# Patient Record
Sex: Female | Born: 1950 | Race: White | Hispanic: No | Marital: Married | Smoking: Former smoker
Health system: Southern US, Community
[De-identification: ages and names within clinical notes are randomized; demographics above are authoritative.]

## PROBLEM LIST (undated history)

## (undated) DIAGNOSIS — K861 Other chronic pancreatitis: Secondary | ICD-10-CM

## (undated) DIAGNOSIS — Z87442 Personal history of urinary calculi: Secondary | ICD-10-CM

## (undated) DIAGNOSIS — N3281 Overactive bladder: Secondary | ICD-10-CM

## (undated) DIAGNOSIS — I700 Atherosclerosis of aorta: Secondary | ICD-10-CM

## (undated) DIAGNOSIS — R112 Nausea with vomiting, unspecified: Secondary | ICD-10-CM

## (undated) DIAGNOSIS — K8020 Calculus of gallbladder without cholecystitis without obstruction: Secondary | ICD-10-CM

## (undated) DIAGNOSIS — E119 Type 2 diabetes mellitus without complications: Secondary | ICD-10-CM

## (undated) DIAGNOSIS — Z9889 Other specified postprocedural states: Secondary | ICD-10-CM

## (undated) DIAGNOSIS — Z7901 Long term (current) use of anticoagulants: Secondary | ICD-10-CM

## (undated) DIAGNOSIS — I10 Essential (primary) hypertension: Secondary | ICD-10-CM

## (undated) DIAGNOSIS — I509 Heart failure, unspecified: Secondary | ICD-10-CM

## (undated) DIAGNOSIS — M1990 Unspecified osteoarthritis, unspecified site: Secondary | ICD-10-CM

## (undated) DIAGNOSIS — M51369 Other intervertebral disc degeneration, lumbar region without mention of lumbar back pain or lower extremity pain: Secondary | ICD-10-CM

## (undated) DIAGNOSIS — G4730 Sleep apnea, unspecified: Secondary | ICD-10-CM

## (undated) DIAGNOSIS — J449 Chronic obstructive pulmonary disease, unspecified: Secondary | ICD-10-CM

## (undated) DIAGNOSIS — I4891 Unspecified atrial fibrillation: Secondary | ICD-10-CM

## (undated) DIAGNOSIS — I2510 Atherosclerotic heart disease of native coronary artery without angina pectoris: Secondary | ICD-10-CM

## (undated) DIAGNOSIS — I499 Cardiac arrhythmia, unspecified: Secondary | ICD-10-CM

## (undated) DIAGNOSIS — R911 Solitary pulmonary nodule: Secondary | ICD-10-CM

## (undated) DIAGNOSIS — M797 Fibromyalgia: Secondary | ICD-10-CM

## (undated) DIAGNOSIS — R931 Abnormal findings on diagnostic imaging of heart and coronary circulation: Secondary | ICD-10-CM

## (undated) DIAGNOSIS — G4733 Obstructive sleep apnea (adult) (pediatric): Secondary | ICD-10-CM

## (undated) DIAGNOSIS — G0490 Encephalitis and encephalomyelitis, unspecified: Secondary | ICD-10-CM

## (undated) DIAGNOSIS — K760 Fatty (change of) liver, not elsewhere classified: Secondary | ICD-10-CM

## (undated) DIAGNOSIS — T8859XA Other complications of anesthesia, initial encounter: Secondary | ICD-10-CM

## (undated) DIAGNOSIS — E785 Hyperlipidemia, unspecified: Secondary | ICD-10-CM

## (undated) DIAGNOSIS — K449 Diaphragmatic hernia without obstruction or gangrene: Secondary | ICD-10-CM

## (undated) HISTORY — PX: KNEE ARTHROSCOPY: SUR90

## (undated) HISTORY — DX: Heart failure, unspecified: I50.9

## (undated) HISTORY — DX: Hyperlipidemia, unspecified: E78.5

## (undated) HISTORY — DX: Encephalitis and encephalomyelitis, unspecified: G04.90

## (undated) HISTORY — DX: Type 2 diabetes mellitus without complications: E11.9

## (undated) HISTORY — PX: BREAST CYST EXCISION: SHX579

## (undated) HISTORY — DX: Cardiac arrhythmia, unspecified: I49.9

## (undated) HISTORY — DX: Unspecified osteoarthritis, unspecified site: M19.90

## (undated) HISTORY — DX: Sleep apnea, unspecified: G47.30

## (undated) HISTORY — PX: DIAGNOSTIC LAPAROSCOPY: SUR761

## (undated) HISTORY — DX: Essential (primary) hypertension: I10

## (undated) HISTORY — DX: Abnormal findings on diagnostic imaging of heart and coronary circulation: R93.1

## (undated) HISTORY — PX: APPENDECTOMY: SHX54

---

## 2002-07-17 HISTORY — PX: BREAST EXCISIONAL BIOPSY: SUR124

## 2006-11-01 ENCOUNTER — Ambulatory Visit (HOSPITAL_COMMUNITY): Admission: RE | Admit: 2006-11-01 | Discharge: 2006-11-01 | Payer: Self-pay | Admitting: Internal Medicine

## 2006-12-03 ENCOUNTER — Ambulatory Visit (HOSPITAL_COMMUNITY): Admission: RE | Admit: 2006-12-03 | Discharge: 2006-12-03 | Payer: Self-pay | Admitting: Surgery

## 2007-03-15 ENCOUNTER — Encounter: Admission: RE | Admit: 2007-03-15 | Discharge: 2007-03-15 | Payer: Self-pay | Admitting: Orthopedic Surgery

## 2007-04-01 ENCOUNTER — Ambulatory Visit (HOSPITAL_COMMUNITY): Admission: RE | Admit: 2007-04-01 | Discharge: 2007-04-01 | Payer: Self-pay | Admitting: Internal Medicine

## 2007-11-22 ENCOUNTER — Ambulatory Visit (HOSPITAL_COMMUNITY): Admission: RE | Admit: 2007-11-22 | Discharge: 2007-11-22 | Payer: Self-pay | Admitting: Internal Medicine

## 2008-04-24 ENCOUNTER — Emergency Department (HOSPITAL_COMMUNITY): Admission: EM | Admit: 2008-04-24 | Discharge: 2008-04-25 | Payer: Self-pay | Admitting: Emergency Medicine

## 2008-04-26 ENCOUNTER — Emergency Department (HOSPITAL_COMMUNITY): Admission: EM | Admit: 2008-04-26 | Discharge: 2008-04-26 | Payer: Self-pay | Admitting: Emergency Medicine

## 2008-04-30 ENCOUNTER — Ambulatory Visit (HOSPITAL_BASED_OUTPATIENT_CLINIC_OR_DEPARTMENT_OTHER): Admission: RE | Admit: 2008-04-30 | Discharge: 2008-04-30 | Payer: Self-pay | Admitting: Urology

## 2009-05-13 ENCOUNTER — Encounter: Admission: RE | Admit: 2009-05-13 | Discharge: 2009-05-13 | Payer: Self-pay | Admitting: Family Medicine

## 2010-01-11 ENCOUNTER — Encounter: Admission: RE | Admit: 2010-01-11 | Discharge: 2010-01-11 | Payer: Self-pay | Admitting: Internal Medicine

## 2010-11-29 NOTE — Op Note (Signed)
NAME:  Tammy [name], Tammy [name] NO.:  [id number]   MEDICAL RECORD NO.:  [id number]          PATIENT TYPE:  AMB   LOCATION:  [facility]                         FACILITY:  [facility]   PHYSICIAN:  Jamison Neighbor, M.D.  DATE OF BIRTH:  22-Jun-1951   DATE OF PROCEDURE:  04/30/2008  DATE OF DISCHARGE:                               OPERATIVE REPORT   PREOPERATIVE DIAGNOSIS:  Left mid ureteral calculus.   POSTOPERATIVE DIAGNOSIS:  Left mid ureteral calculus.   PROCEDURE:  Cystoscopy, left retrograde, left ureteral dilation, left  ureteroscopy, left in situ laser lithotripsy, left double-J catheter  insertion.   SURGEON:  Jamison Neighbor, M.D.   ANESTHESIA:  General.   COMPLICATIONS:  None.   DRAINS:  A 6-French x 26 cm double-J catheter in the left ureter.   HISTORY:  This 60 year old female has a left-sided ureteral calculus  that is stuck.  It is difficult to visualize on plain films.  For that  reason, it is not amenable to ESWL.  The patient is now to undergo left  in situ laser lithotripsy.  She understands the risks and benefits of  the procedure and gave full informed consent.   PROCEDURE:  After successful induction of general anesthesia, the  patient was placed in the dorsal lithotomy position, prepped with  Betadine and draped in the usual sterile fashion.  Cystoscopy was  performed.  The bladder was carefully inspected.  No tumors or stones  could be seen.  Both ureteral orifices were normal in configuration and  location.  A left retrograde was done.  A 6-French open-ended catheter  was passed into the left ureter.  Contrast was injected under direct  vision.  This ureter was normal.  In fact, if anything, it was slightly  small.  At the level of L3-4, a filling defect could be seen that was  consistent with a stone that had been seen on previous imaging studies.  Contrast did go beyond that point and showed a dilated ureter but an  otherwise normal collecting system  with an unremarkable pelvis with no  filling defects or other irregularities seen.   Following completion of the left retrograde, a guidewire was passed up  to the kidney.  The ureteroscope could not be inserted much beyond the  intramural tunnel due to the small nature of the ureter.  For that  reason, a ureteral access sheath was obtained and was used to dilate the  ureter up to the level of the stone.  The ureteroscope was then  reintroduced alongside the guidewire and was brought all way up to the  stone.  The stone could be grasped with a basket but the ureter was  little too small to allow the stone to be removed.  For that reason, the  basket was disengaged.  A laser wire was then obtained and the stone was  fragmented.  This broke the stone down very nicely into tiny pieces  which will pass.  The ureteroscope was advanced.  Beyond that point  there was no obstruction higher up.  The ureteroscope was withdrawn  under  direct vision and there was no injury to the ureter.  Because the  patient did require the dilation, it was felt that a guidewire should  left in place and a  double-J should be inserted.  Using fluoroscopic  control, the guidewire was passed over the kidney and allowed to coil  normally within the kidney and the renal pelvis.  [name] bladder  was drained.  The patient tolerated procedure and was taken to the  recovery room in good condition.  She will be sent home with oxycodone  10/325,  __________ which she had previously taken for nausea, Ditropan  as needed for spasms, and Pyridium as needed for dysuria.  She will have  the stent removed in 1 week's time.      Jamison Neighbor, M.D.  Electronically Signed     RJE/MEDQ  D:  04/30/2008  T:  04/30/2008  Job:  [id number]

## 2010-12-27 ENCOUNTER — Other Ambulatory Visit: Payer: Self-pay | Admitting: Internal Medicine

## 2010-12-27 DIAGNOSIS — Z1231 Encounter for screening mammogram for malignant neoplasm of breast: Secondary | ICD-10-CM

## 2011-01-25 ENCOUNTER — Ambulatory Visit
Admission: RE | Admit: 2011-01-25 | Discharge: 2011-01-25 | Disposition: A | Discharge status: Home / Self Care | Source: Ambulatory Visit | Attending: Internal Medicine | Admitting: Internal Medicine

## 2011-01-25 DIAGNOSIS — Z1231 Encounter for screening mammogram for malignant neoplasm of breast: Secondary | ICD-10-CM

## 2011-04-17 LAB — POCT I-STAT, CHEM 8
BUN: 13
BUN: 19
Calcium, Ion: 1.05 — ABNORMAL LOW
Calcium, Ion: 1.16
Chloride: 103
Chloride: 103
Creatinine, Ser: 1.1
Creatinine, Ser: 1.2
Glucose, Bld: 117 — ABNORMAL HIGH
Glucose, Bld: 122 — ABNORMAL HIGH
HCT: 43
HCT: 48 — ABNORMAL HIGH
Hemoglobin: 14.6
Hemoglobin: 16.3 — ABNORMAL HIGH
Potassium: 4.3
Potassium: 4.8
Sodium: 136
Sodium: 138
TCO2: 29
TCO2: 30

## 2011-04-17 LAB — CBC
HCT: 43
HCT: 45.5
Hemoglobin: 14.1
Hemoglobin: 15.1 — ABNORMAL HIGH
MCHC: 32.7
MCHC: 33.2
MCV: 86.6
MCV: 87.7
Platelets: 212
Platelets: 214
RBC: 4.91
RBC: 5.25 — ABNORMAL HIGH
RDW: 13.9
RDW: 14.2
WBC: 12.5 — ABNORMAL HIGH
WBC: 13.9 — ABNORMAL HIGH

## 2011-04-17 LAB — DIFFERENTIAL
Basophils Absolute: 0.1
Basophils Absolute: 0.3 — ABNORMAL HIGH
Basophils Relative: 1
Basophils Relative: 2 — ABNORMAL HIGH
Eosinophils Absolute: 0
Eosinophils Absolute: 0.1
Eosinophils Relative: 0
Eosinophils Relative: 0
Lymphocytes Relative: 11 — ABNORMAL LOW
Lymphocytes Relative: 8 — ABNORMAL LOW
Lymphs Abs: 1
Lymphs Abs: 1.6
Monocytes Absolute: 0.9
Monocytes Absolute: 1.1 — ABNORMAL HIGH
Monocytes Relative: 7
Monocytes Relative: 8
Neutro Abs: 10.3 — ABNORMAL HIGH
Neutro Abs: 11.1 — ABNORMAL HIGH
Neutrophils Relative %: 80 — ABNORMAL HIGH
Neutrophils Relative %: 82 — ABNORMAL HIGH

## 2011-04-17 LAB — URINALYSIS, ROUTINE W REFLEX MICROSCOPIC
Bilirubin Urine: NEGATIVE
Glucose, UA: NEGATIVE
Ketones, ur: 40 — AB
Leukocytes, UA: NEGATIVE
Nitrite: NEGATIVE
Protein, ur: 30 — AB
Specific Gravity, Urine: 1.028
Urobilinogen, UA: 0.2
pH: 6

## 2011-04-17 LAB — URINE MICROSCOPIC-ADD ON

## 2011-04-18 LAB — POCT I-STAT, CHEM 8
BUN: 12
Calcium, Ion: 1.2
Chloride: 100
Creatinine, Ser: 0.9
Glucose, Bld: 110 — ABNORMAL HIGH
HCT: 43
Hemoglobin: 14.6
Potassium: 3.5
Sodium: 139
TCO2: 29

## 2011-04-27 LAB — URINE CULTURE
Colony Count: NO GROWTH
Culture: NO GROWTH

## 2011-09-19 ENCOUNTER — Telehealth (HOSPITAL_COMMUNITY): Payer: Self-pay | Admitting: Dietician

## 2011-09-19 NOTE — Telephone Encounter (Signed)
Received referral from [facility] (Dr. Jacinto Halim) for dx: weight management.

## 2011-09-19 NOTE — Telephone Encounter (Signed)
Appointment scheduled for 10/17/11 at 8:30 AM.

## 2011-10-17 ENCOUNTER — Encounter (HOSPITAL_COMMUNITY): Payer: Self-pay | Admitting: Dietician

## 2011-10-17 NOTE — Progress Notes (Signed)
Pt was a no-show for appointment scheduled for 10/17/11 at 8:30 AM. Sent letter to pt home notifying pt of no-show and requesting rescheduling appointment.

## 2012-04-02 ENCOUNTER — Other Ambulatory Visit: Payer: Self-pay | Admitting: Internal Medicine

## 2012-04-02 DIAGNOSIS — Z1231 Encounter for screening mammogram for malignant neoplasm of breast: Secondary | ICD-10-CM

## 2012-05-20 ENCOUNTER — Ambulatory Visit
Admission: RE | Admit: 2012-05-20 | Discharge: 2012-05-20 | Disposition: A | Discharge status: Home / Self Care | Source: Ambulatory Visit | Attending: Internal Medicine | Admitting: Internal Medicine

## 2012-05-20 DIAGNOSIS — Z1231 Encounter for screening mammogram for malignant neoplasm of breast: Secondary | ICD-10-CM

## 2012-06-06 ENCOUNTER — Ambulatory Visit (HOSPITAL_COMMUNITY)
Admission: RE | Admit: 2012-06-06 | Discharge: 2012-06-06 | Disposition: A | Discharge status: Home / Self Care | Source: Ambulatory Visit | Attending: Family Medicine | Admitting: Family Medicine

## 2012-06-06 DIAGNOSIS — IMO0001 Reserved for inherently not codable concepts without codable children: Secondary | ICD-10-CM | POA: Insufficient documentation

## 2012-06-06 DIAGNOSIS — M1710 Unilateral primary osteoarthritis, unspecified knee: Secondary | ICD-10-CM | POA: Insufficient documentation

## 2012-06-06 DIAGNOSIS — M25569 Pain in unspecified knee: Secondary | ICD-10-CM | POA: Insufficient documentation

## 2012-06-06 NOTE — Evaluation (Signed)
Physical Therapy Evaluation  Patient Details  Name: Tammy [name] MRN: [id number] Date of Birth: February 02, 1951  Today's Date: 06/06/2012 Time: 1520-1600 PT Time Calculation (min): 40 min Charges: 1 eval, 10' TE0 Visit#: 1  of 8   Re-eval: 07/06/12  Diagnosis: b knee OA Next MD Visit: Dr. Althea Charon  Subjective Symptoms/Limitations Symptoms: PMH: DMII, HTN, Hyperlipidemia, gabapentin.  Pertinent History: Pt is referred to PT for B knee pain which has been evident for years, however in the last few months they have become progressivly worse.  She reports that she recieved an injection to both of her knees last wekk which has decreased the pain.  She plans on getting a shot weekly for 5 weeks.  The MD has placed an order for a SPC.  She reports that she has most difficulty with her balance while stepping on a curb. She has difficulty picking up object from the ground.  How long can you stand comfortably?: 10 minutes How long can you walk comfortably?: flat surface: 30-45 minutes, rugged terrian: 10 minutes (on her driveway).  Patient Stated Goals: "I want to become more limber."  Pain Assessment Currently in Pain?: Yes Pain Score:   3 (Pain range: 3-8/10.  ) Pain Location: Knee Pain Orientation: Right;Left Pain Relieving Factors: cortisone injection, aleeve, denies trying ice.  Effect of Pain on Daily Activities: bending over (squatting), pain in the AM, difficulty going from sit to stand  Prior Functioning  Prior Function Comments: She enjoys taking care of her mom (helping around the house and taking her out), she enjoys shopping.   Cognition/Observation Observation/Other Assessments Observations: Q: angle: L: 20 degrees; R: 25 Other Assessments: R and L patella lateral tilt   Sensation/Coordination/Flexibility/Functional Tests Functional Tests Functional Tests: LEFS: 15/80  RLE Strength Right Hip Flexion: 4/5 Right Hip Extension: 3+/5 Right Hip ABduction: 3+/5 Right Hip  ADduction: 4/5 Right Knee Flexion: 3+/5 Right Knee Extension: 4/5  LLE Strength Left Hip Flexion: 4/5 Left Hip Extension: 3+/5 Left Hip ABduction: 4/5 Left Hip ADduction: 3+/5 Left Knee Flexion: 3+/5 Left Knee Extension: 4/5  Palpation: mild atrophy to B gluteal region. Increased fascial restrictions to R fibular head.   Mobility/Balance  Ambulation/Gait Ambulation/Gait: Yes Gait Pattern: Antalgic (genu valgum) Static Standing Balance Single Leg Stance - Right Leg: 2  Single Leg Stance - Left Leg: 10  Tandem Stance - Right Leg: 10  (impaired hip and ankle strategy) Tandem Stance - Left Leg: 10  Rhomberg - Eyes Opened: 10  Rhomberg - Eyes Closed: 10    Exercise/Treatments Supine Straight Leg Raises: Both;10 reps Sidelying Hip ABduction: Both;10 reps Hip ADduction: Both;10 reps Prone  Hip Extension: Both;10 reps  Physical Therapy Assessment and Plan PT Assessment and Plan Clinical Impression Statement: Pt is a 61 year old female referred to PT for bilateral knee OA.   Pt will benefit from skilled therapeutic intervention in order to improve on the following deficits: Decreased balance;Decreased strength;Pain;Abnormal gait;Impaired perceived functional ability Rehab Potential: Good PT Frequency: Min 2X/week PT Duration: 4 weeks PT Treatment/Interventions: Gait training;Stair training;Functional mobility training;Therapeutic activities;Therapeutic exercise;Balance training;Patient/family education;Manual techniques;Modalities PT Plan: Tape patella for appropriate alignment, Continue with general LE strengthening: standing: squatting, heel and toe raises, knee flexion, SLS; supine 4 way SLR, SAQ and quad sets.  Manual techniques and modalities to decrease pain to R fibular head. Continue to progress balance and decreased pain     Goals Home Exercise Program Pt will Perform Home Exercise Program: Independently PT Goal: Perform Home Exercise Program -  Progress: Goal set  today PT Short Term Goals Time to Complete Short Term Goals: 2 weeks PT Short Term Goal 1: Pt will improve LE strength by 1 muscle strength. PT Short Term Goal 2: Pt will decrease fascial restrictions to R knee.  PT Short Term Goal 3: Pt will improve static balance and demonstrate R and L SLS x30 sec on solid surface.  PT Long Term Goals Time to Complete Long Term Goals: 4 weeks PT Long Term Goal 1: Pt will improve LE strength in order to tolerate squatting. PT Long Term Goal 2: Pt will improve LE strength in order to ambulate with apporpriate gait mechanics.  Long Term Goal 3: Pt will report pain less than 2/10 for 75% of her day for improved QOL.  Long Term Goal 4: Pt will improve her LEFS to 40/80 for improved percieved functional ability.   Problem List Patient Active Problem List  Diagnosis  . Knee pain   Grey Rakestraw, PT 06/06/2012, 4:11 PM  Physician Documentation Your signature is required to indicate approval of the treatment plan as stated above.  Please sign and either send electronically or make a copy of this report for your files and return this physician signed original.   Please mark one 1.__approve of plan  2. ___approve of plan with the following conditions.   ______________________________                                                          _____________________ Physician Signature                                                                                                             Date

## 2012-06-07 NOTE — Evaluation (Signed)
Please fax to Dr. Althea Charon at his [facility] office.  Thanks.

## 2012-06-11 ENCOUNTER — Ambulatory Visit (HOSPITAL_COMMUNITY)
Admission: RE | Admit: 2012-06-11 | Discharge: 2012-06-11 | Disposition: A | Discharge status: Home / Self Care | Source: Ambulatory Visit | Attending: Family Medicine | Admitting: Family Medicine

## 2012-06-11 NOTE — Progress Notes (Signed)
Physical Therapy Treatment Patient Details  Name: Tammy [name] MRN: [id number] Date of Birth: [birth year]  Today's Date: 06/11/2012 Time: 4098-1191 PT Time Calculation (min): 32 min Visit#: 2  of 8   Re-eval: 07/06/12 Charges:  therex 24', taping X 1  Subjective: Symptoms/Limitations Symptoms: Pt. reports pain comes and goes; mostly only having pain in her R knee when she does have it.   Exercise/Treatments Standing Heel Raises: 10 reps;Limitations Heel Raises Limitations: toeraises 10 reps Knee Flexion: 10 reps Functional Squat: 10 reps Rocker Board: 1 minute SLS: R:10", L:35" max of 3 Seated Long Arc Quad: 10 reps;Both Supine Quad Sets: 10 reps;5 sets Hip Adduction Isometric: 10 reps;Limitations Hip Adduction Isometric Limitations: 5" holds Bridges: 10 reps Straight Leg Raises: 10 reps;Both Knee Flexion: 10 reps;Both Sidelying Hip ABduction: Both;10 reps Hip ADduction: Both;10 reps Prone  Hamstring Curl: 10 reps Hip Extension: Both;10 reps   Manual Therapy Manual Therapy: Other (comment) Other Manual Therapy: kinesiotaping for lateral patellar tracking  Physical Therapy Assessment and Plan PT Assessment and Plan Clinical Impression Statement: Applied kinesiotape to R patella for lateral tracking prior to activity. Added standing exercises with manual /VC's for form and to decrease substitution. Pt. reported pain with rockerboard, only able to complete 1 minute of activity. Noted difficulty with SLS Pt will benefit from skilled therapeutic intervention in order to improve on the following deficits: Decreased balance;Decreased strength;Pain;Abnormal gait;Impaired perceived functional ability Rehab Potential: Good PT Frequency: Min 2X/week PT Duration: 4 weeks PT Treatment/Interventions: Gait training;Stair training;Functional mobility training;Therapeutic activities;Therapeutic exercise;Balance training;Patient/family education;Manual techniques;Modalities PT  Plan: Continue to progress strength; assess effectiveness of kinesiotaping next visit.     Problem List Patient Active Problem List  Diagnosis  . Knee pain    PT - End of Session Activity Tolerance: Patient tolerated treatment well General Behavior During Session: [facility] for tasks performed Cognition: [facility] for tasks performed   Lurena Nida, PTA/CLT 06/11/2012, 4:53 PM

## 2012-06-12 ENCOUNTER — Ambulatory Visit (HOSPITAL_COMMUNITY)
Admission: RE | Admit: 2012-06-12 | Discharge: 2012-06-12 | Disposition: A | Discharge status: Home / Self Care | Source: Ambulatory Visit | Attending: Family Medicine | Admitting: Family Medicine

## 2012-06-12 NOTE — Progress Notes (Signed)
Physical Therapy Treatment Patient Details  Name: Tammy [name] MRN: [id number] Date of Birth: [birth year]  Today's Date: 06/12/2012 Time: 2952-8413 PT Time Calculation (min): 40 min Visit#: 3  of 8   Re-eval: 07/06/12 Charges: therex 34', taping X 1  Subjective: Symptoms/Limitations Symptoms: Pt. states she thinks the tape helped; reports pain is 2/10 today; tape just came off this morning. Pain Assessment Currently in Pain?: Yes Pain Score:   2 Pain Location: Knee Pain Orientation: Right   Exercise/Treatments Standing Heel Raises: 15 reps Heel Raises Limitations: toeraises 15 reps Rocker Board: Limitations Rocker Board Limitations: too painful today SLS: R:18", L:15 max of 3 SLS with Vectors: 5X5" holds each fingertip hold Seated Long Arc Quad: 15 reps;Both Supine Quad Sets: 15 reps Hip Adduction Isometric: 15 reps Hip Adduction Isometric Limitations: 5" holds Bridges: 15 reps Straight Leg Raises: 15 reps Sidelying Hip ABduction: 15 reps;Both Hip ADduction: 15 reps;Both Prone  Hamstring Curl: 15 reps Hip Extension: 15 reps   Manual Therapy Manual Therapy: Other (comment) Other Manual Therapy: Kinesiotaping for lateral patellar tracking  Physical Therapy Assessment and Plan PT Assessment and Plan Clinical Impression Statement: kinesiotape appears to be helping with patellar tracking with activity.  Pt. continues to have pain with R/L rockerboard, so held activity today.  Added vector stance to help increase stability to progress toward goal of 1 minute SLS on each LE.  Able to increase reps today without diffiuculty. PT Plan: Continue to progress strength by increasing reps/weights as able.      Problem List Patient Active Problem List  Diagnosis  . Knee pain    PT - End of Session Activity Tolerance: Patient tolerated treatment well General Behavior During Session: [facility] for tasks performed Cognition: [facility] for tasks performed   Lurena Nida,  PTA/CLT 06/12/2012, 2:30 PM

## 2012-06-18 ENCOUNTER — Ambulatory Visit (HOSPITAL_COMMUNITY)
Admission: RE | Admit: 2012-06-18 | Discharge: 2012-06-18 | Disposition: A | Discharge status: Home / Self Care | Source: Ambulatory Visit | Attending: Family Medicine | Admitting: Family Medicine

## 2012-06-18 DIAGNOSIS — M1710 Unilateral primary osteoarthritis, unspecified knee: Secondary | ICD-10-CM | POA: Insufficient documentation

## 2012-06-18 DIAGNOSIS — IMO0001 Reserved for inherently not codable concepts without codable children: Secondary | ICD-10-CM | POA: Insufficient documentation

## 2012-06-18 NOTE — Progress Notes (Signed)
Physical Therapy Treatment Patient Details  Name: Tammy [name] MRN: [id number] Date of Birth: 21-Oct-1950  Today's Date: 06/18/2012 Time: 8295-6213 PT Time Calculation (min): 48 min  Visit#: 4  of 8   Re-eval: 07/06/12 Assessment Diagnosis: b knee OA Next MD Visit: Dr. Althea Charon 06/20/2012 Prior Therapy: None Charge: therex 38', kinesiotaping x 1 unit, manual x 8'  Subjective: Symptoms/Limitations Symptoms: Pt reported she thinks the kinesiotape helps but fell off following last session.  B knees pain 5/10.  Pt with MD apt on Thursday, going to get shots. Pain Assessment Currently in Pain?: Yes Pain Score:   5 Pain Location: Knee Pain Orientation: Right;Left  Objective:   Exercise/Treatments Aerobic Stationary Bike: 6' @ 1.0 for ROM, strength and activity tolerance Standing Heel Raises: 15 reps Heel Raises Limitations: toeraises 15 reps Functional Squat: 15 reps SLS: R: 12", L2x 30" max of 3; increased fatigue due to SLS done at end of session SLS with Vectors: 5X5" holds 1 fingertip Seated  LAQ- time Supine Short Arc Quad Sets: 15 reps;Limitations Short Arc Quad Sets Limitations: 3# Bridges: 15 reps Straight Leg Raises: 10 reps;Limitations Straight Leg Raises Limitations: 3# Patellar Mobs: S/I, R/L and tib/fib for patellar tracking and pain reduction Sidelying Hip ABduction: 15 reps;Both Hip ADduction: 15 reps;Both Clams: 5x 10" with manual faciliation for glut med contraction and tactile cueing to reduce h/s activation Prone   prone ex- time   Manual Therapy Manual Therapy: Joint mobilization Joint Mobilization: grade I-II patella mobs R/L, S/I and tib/fib mobs Other Manual Therapy: Kinesiotaping for lateral patellar tracking  Physical Therapy Assessment and Plan PT Assessment and Plan Clinical Impression Statement: Pt c/o knee popping while riding stationary bicycle at beginning of session, kinesiotaping appears to help with lateral patella tracking.  No  c/o popping during therex following taping.  Patella mobs complete for lateral tracking..  Pt reported pain reduced with tib/fib joint mobs.   Pt required multimodal cueing to reduce compensation with mat activities.  Added clam to improve hip mobility with manual facilitation required for glut med contraction and tactile cueing to h/s to relax. PT Plan: Re-assess prior MD apt next session.  Continue to progress strength, resume isometric adduction or add ball between knees during bridges.  Continue with taping to help with patella tracking.    Goals    Problem List Patient Active Problem List  Diagnosis  . Knee pain    PT - End of Session Activity Tolerance: Patient tolerated treatment well General Behavior During Session: [facility] for tasks performed Cognition: [facility] for tasks performed  GP    Juel Burrow 06/18/2012, 4:17 PM

## 2012-06-20 ENCOUNTER — Ambulatory Visit (HOSPITAL_COMMUNITY)
Admission: RE | Admit: 2012-06-20 | Discharge: 2012-06-20 | Disposition: A | Discharge status: Home / Self Care | Source: Ambulatory Visit | Attending: Family Medicine | Admitting: Family Medicine

## 2012-06-20 NOTE — Evaluation (Cosign Needed)
Physical Therapy Discharge and Treatment  Patient Details  Name: Tammy [name] MRN: [id number] Date of Birth: 1951/03/08  Today's Date: 06/20/2012 Time: 1110-1145 PT Time Calculation (min): 35 min Charges: 1 MMT, 15' TE, 8 Self Care Visit#: 5  of 8   Re-eval: 07/06/12 Assessment Diagnosis: b knee OA Next MD Visit: Dr. Althea Charon 06/20/2012  Subjective Symptoms/Limitations Symptoms: Pt reports that she is not doing to well today.  She feels that therapy is hurting more than helping.  Pain Assessment Currently in Pain?: Yes Pain Score:   8 (w/o pain medication) Pain Orientation: Right;Left Pain Relieving Factors: pain medication and ice  Sensation/Coordination/Flexibility/Functional Tests Coordination Gross Motor Movements are Fluid and Coordinated: No Coordination and Movement Description: improved L distal quad coordination, continues to have moderate impairement to R VMO activation Functional Tests Functional Tests: LEFS: 25/80 (was 15/80)  RLE Strength Right Hip Flexion: 5/5 (4+/5, was 4/5) Right Hip Extension: 4/5 (was 3+/5) Right Hip ABduction: 4/5 (was 3+/5) Right Hip ADduction:  (was 4/5) Right Knee Flexion: 4/5 (was 3+/5) Right Knee Extension: 5/5 (was 4/5)  LLE Strength Left Hip Flexion: 5/5 (was 4/5) Left Hip Extension: 4/5 (was 3+/5) Left Hip ABduction: 4/5 (was 4/5) Left Hip ADduction:  (was 3+/5) Left Knee Flexion: 4/5 (was 3+/5) Left Knee Extension: 5/5 (was 4/5)  Palpation: Fascial restriciton to R fibular head and quadriceps  Mobility/Balance  Ambulation/Gait Gait Pattern: Antalgic (genu valgum) Static Standing Balance Single Leg Stance - Right Leg: 12  (was 2) Single Leg Stance - Left Leg: 12  (was 10)   Exercise/Treatments Standing SLS: 3x30" (best time 12 c/o UE support) Supine Straight Leg Raises: Right;20 reps Patellar Mobs: education and demonstration in all directions  Sidelying Hip ABduction: Both;20 reps Clams: 20x10" holds Prone   Hamstring Curl: 10 reps (BLE) Hip Extension: Both;20 reps  Physical Therapy Assessment and Plan PT Assessment and Plan Clinical Impression Statement: Ms. Knezevic has attended 5 OP PT visits to address B knee OA with following findings: met 1/4 STG and  0/4 LTG and is not making progress secondary to increased pain to knee joint.  She has improve her overall quadricep activation and LE strength, however still demonstrates greatest difficulty to RLE due to impaired strength. D/C from PT secondary to pt request. PT Plan: D/C    Goals Pt will Perform Home Exercise Program: Independently: Met PT Short Term Goals: 2 weeks  Goal 1: Pt will improve LE strength by 1 muscle strength.: Progressing toward goal Goal 2: Pt will decrease fascial restrictions to R knee. : Progressing toward goal (to quadricep and fibular head) Goal 3: Pt will improve static balance and demonstrate R and L SLS x30 sec on solid surface. : Progressing toward goal (R and L 12 sec) PT Long Term Goals: 4 weeks Goal 1: Pt will improve LE strength in order to tolerate squatting. Not met Goal 2: Pt will improve LE strength in order to ambulate with apporpriate gait mechanics. : Not met Goal 3: Pt will report pain less than 2/10 for 75% of her day for improved QOL. : Not met Goal 4: Pt will improve her LEFS to 40/80 for improved percieved functional ability. : Not met  Problem List Patient Active Problem List  Diagnosis  . Knee pain   PT - End of Session Activity Tolerance: Patient tolerated treatment well General Behavior During Session: [facility] for tasks performed Cognition: [facility] for tasks performed PT Plan of Care PT Patient Instructions: discussed LEFS, importance to continue with strengthening (  especially to RLE) to improve strength and coordination if pt requires surgery in the future.  Consulted and Agree with Plan of Care: Patient  Annett Fabian, PT 06/20/2012, 12:09 PM  Physician Documentation Your signature is  required to indicate approval of the treatment plan as stated above.  Please sign and either send electronically or make a copy of this report for your files and return this physician signed original.   Please mark one 1.__approve of plan  2. ___approve of plan with the following conditions.   ______________________________                                                          _____________________ Physician Signature                                                                                                             Date

## 2014-08-05 ENCOUNTER — Other Ambulatory Visit: Payer: Self-pay

## 2014-08-05 ENCOUNTER — Other Ambulatory Visit: Payer: Self-pay | Admitting: Internal Medicine

## 2014-08-05 DIAGNOSIS — E1149 Type 2 diabetes mellitus with other diabetic neurological complication: Secondary | ICD-10-CM

## 2014-08-05 DIAGNOSIS — E785 Hyperlipidemia, unspecified: Secondary | ICD-10-CM

## 2014-08-05 DIAGNOSIS — Z1231 Encounter for screening mammogram for malignant neoplasm of breast: Secondary | ICD-10-CM

## 2014-08-21 ENCOUNTER — Ambulatory Visit
Admission: RE | Admit: 2014-08-21 | Discharge: 2014-08-21 | Disposition: A | Discharge status: Home / Self Care | Source: Ambulatory Visit

## 2014-08-21 ENCOUNTER — Ambulatory Visit

## 2014-08-21 ENCOUNTER — Ambulatory Visit
Admission: RE | Admit: 2014-08-21 | Discharge: 2014-08-21 | Disposition: A | Discharge status: Home / Self Care | Payer: No Typology Code available for payment source | Source: Ambulatory Visit | Attending: Internal Medicine | Admitting: Internal Medicine

## 2014-08-21 DIAGNOSIS — Z1231 Encounter for screening mammogram for malignant neoplasm of breast: Secondary | ICD-10-CM

## 2014-08-21 DIAGNOSIS — E1149 Type 2 diabetes mellitus with other diabetic neurological complication: Secondary | ICD-10-CM

## 2014-08-21 DIAGNOSIS — E785 Hyperlipidemia, unspecified: Secondary | ICD-10-CM

## 2014-11-02 ENCOUNTER — Ambulatory Visit (INDEPENDENT_AMBULATORY_CARE_PROVIDER_SITE_OTHER): Admitting: Cardiology

## 2014-11-02 ENCOUNTER — Encounter: Payer: Self-pay | Admitting: Cardiology

## 2014-11-02 ENCOUNTER — Other Ambulatory Visit (HOSPITAL_COMMUNITY): Payer: Self-pay | Admitting: Cardiology

## 2014-11-02 VITALS — BP 118/58 | HR 86 | Ht 62.0 in | Wt 267.1 lb

## 2014-11-02 DIAGNOSIS — I2584 Coronary atherosclerosis due to calcified coronary lesion: Secondary | ICD-10-CM | POA: Diagnosis not present

## 2014-11-02 DIAGNOSIS — I2510 Atherosclerotic heart disease of native coronary artery without angina pectoris: Secondary | ICD-10-CM

## 2014-11-02 NOTE — Progress Notes (Signed)
Cardiology Office Note   Date:  11/02/2014   ID:  Lasharn Bufkin, DOB [birth year], MRN [id number]  PCP:  [name], MD  Cardiologist:   [name], MD   Chief Complaint  Patient presents with  . Coronary Artery Disease      History of Present Illness: Tammy [name] is a 64 y.o. female who presents for evaluation of calcium. She has no past cardiac history. Recently she had a coronary calcium score and I was able to review this. Her level is greater than 899 percentile for age. She is somewhat limited in activities because of knee pain. She's not describing any chest pressure, neck or arm discomfort. She's not been having any palpitations other than occasionally at night. She's not having any presyncope or syncope. She has no shortness of breath, PND or orthopnea. There've been no edema or weight gain.  She said she did have a stress test years ago was negative. She does have multiple cardiovascular risk factors.   Past Medical History  Diagnosis Date  . Agatston coronary artery calcium score greater than 400   . Sleep apnea     No CPAP  . HTN (hypertension)   . Diabetes mellitus     x 3 years  . Hyperlipidemia   . DJD (degenerative joint disease)   . Encephalitis     Past Surgical History  Procedure Laterality Date  . Knee arthroscopy    . Appendectomy    . Breast cyst excision       Current Outpatient Prescriptions  Medication Sig Dispense Refill  . amLODipine-valsartan (EXFORGE) 10-320 MG per tablet Take 1 tablet by mouth daily.    Marland Kitchen aspirin 81 MG tablet Take 81 mg by mouth daily.    . beta carotene w/minerals (OCUVITE) tablet Take 1 tablet by mouth daily.    . DULoxetine (CYMBALTA) 60 MG capsule Take 60 mg by mouth daily.    Marland Kitchen EPINEPHrine 0.3 mg/0.3 mL IJ SOAJ injection Inject 0.3 mg into the muscle as needed.    . fesoterodine (TOVIAZ) 4 MG TB24 tablet Take 4 mg by mouth daily.    . metFORMIN (GLUCOPHAGE) 1000 MG tablet Take 1,000 mg by mouth 2 (two)  times daily with a meal.    . naproxen (NAPROSYN) 500 MG tablet Take 500 mg by mouth as needed.    . traMADol (ULTRAM) 50 MG tablet Take 50 mg by mouth as needed.     No current facility-administered medications for this visit.    Allergies:   Betadine; Contrast media; Iodine; and Shellfish allergy    Social History:  The patient  reports that she quit smoking about 6 years ago. Her smoking use included Cigarettes. She has a 30 pack-year smoking history. She does not have any smokeless tobacco history on file. She reports that she does not drink alcohol or use illicit drugs.   Family History:  [name] family history includes CAD (age of onset: 39) in her mother; CAD (age of onset: 32) in her brother.    ROS:  Please see the history of present illness.   Otherwise, review of systems are positive for knee pain.   All other systems are reviewed and negative.    PHYSICAL EXAM: VS:  BP 118/58 mmHg  Pulse 86  Ht 5\' 2"  (1.575 m)  Wt 267 lb 1.6 oz (121.156 kg)  BMI 48.84 kg/m2 , BMI Body mass index is 48.84 kg/(m^2). GENERAL:  Well appearing HEENT:  Pupils equal round  and reactive, fundi not visualized, oral mucosa unremarkable NECK:  No jugular venous distention, waveform within normal limits, carotid upstroke brisk and symmetric, no bruits, no thyromegaly LYMPHATICS:  No cervical, inguinal adenopathy LUNGS:  Clear to auscultation bilaterally BACK:  No CVA tenderness CHEST:  Unremarkable HEART:  PMI not displaced or sustained,S1 and S2 within normal limits, no S3, no S4, no clicks, no rubs, no murmurs ABD:  Flat, positive bowel sounds normal in frequency in pitch, no bruits, no rebound, no guarding, no midline pulsatile mass, no hepatomegaly, no splenomegaly EXT:  2 plus pulses throughout, no edema, no cyanosis no clubbing SKIN:  No rashes no nodules NEURO:  Cranial nerves II through XII grossly intact, motor grossly intact throughout PSYCH:  Cognitively intact, oriented to person  place and time    EKG:  EKG is ordered today. The ekg ordered today demonstrates sinus rhythm, rate 86, axis within normal limits, intervals within normal limits, no acute ST-T wave changes.   Recent Labs: No results found for requested labs within last 365 days.    Lipid Panel No results found for: CHOL, TRIG, HDL, CHOLHDL, VLDL, LDLCALC, LDLDIRECT    Wt Readings from Last 3 Encounters:  11/02/14 267 lb 1.6 oz (121.156 kg)      Other studies Reviewed: Additional studies/ records that were reviewed today include: None.    ASSESSMENT AND PLAN:  CORONARY CALCIUM:  The patient has no overt symptoms. I would like to screen with a treadmill test. However, she is limited by knee pain would not be a walk on a treadmill. Therefore, she will have a [employer].  DYSLIPIDEMIA:  She apparently is intolerant of statins. I sent a message to [name], MD  Get documentation on this.  At that point we would refer her to our lipid clinic for PCSK9 inhibition.  OBESITY:  The patient understands the need to lose weight with diet and exercise. We have discussed specific strategies for this.   Current medicines are reviewed at length with the patient today.  The patient does not have concerns regarding medicines.  The following changes have been made:  no change  Labs/ tests ordered today include:   Orders Placed This Encounter  Procedures  . Myocardial Perfusion Imaging  . EKG 12-Lead     Disposition:   FU with me in six months.     Signed, [name], MD  11/02/2014 8:32 AM    [facility]

## 2014-11-02 NOTE — Patient Instructions (Signed)
Your physician has requested that you have a lexiscan myoview. For further information please visit [email or web address]. Please follow instruction sheet, as given.  Dr.Hochrein wants you to follow-up in: 6 months. You will receive a reminder letter in the mail two months in advance. If you don't receive a letter, please call our office to schedule the follow-up appointment.

## 2014-11-05 ENCOUNTER — Telehealth (HOSPITAL_COMMUNITY): Payer: Self-pay

## 2014-11-05 ENCOUNTER — Encounter (HOSPITAL_COMMUNITY): Payer: No Typology Code available for payment source

## 2014-11-05 NOTE — Telephone Encounter (Signed)
Encounter complete. 

## 2014-11-10 ENCOUNTER — Ambulatory Visit (HOSPITAL_COMMUNITY)
Admission: RE | Admit: 2014-11-10 | Discharge: 2014-11-10 | Disposition: A | Discharge status: Home / Self Care | Source: Ambulatory Visit | Attending: Cardiology | Admitting: Cardiology

## 2014-11-10 DIAGNOSIS — R42 Dizziness and giddiness: Secondary | ICD-10-CM | POA: Diagnosis not present

## 2014-11-10 DIAGNOSIS — Z6841 Body Mass Index (BMI) 40.0 and over, adult: Secondary | ICD-10-CM | POA: Diagnosis not present

## 2014-11-10 DIAGNOSIS — R0609 Other forms of dyspnea: Secondary | ICD-10-CM | POA: Insufficient documentation

## 2014-11-10 DIAGNOSIS — Z8249 Family history of ischemic heart disease and other diseases of the circulatory system: Secondary | ICD-10-CM | POA: Insufficient documentation

## 2014-11-10 DIAGNOSIS — E669 Obesity, unspecified: Secondary | ICD-10-CM | POA: Insufficient documentation

## 2014-11-10 DIAGNOSIS — R5383 Other fatigue: Secondary | ICD-10-CM | POA: Insufficient documentation

## 2014-11-10 DIAGNOSIS — J449 Chronic obstructive pulmonary disease, unspecified: Secondary | ICD-10-CM | POA: Diagnosis not present

## 2014-11-10 DIAGNOSIS — Z87891 Personal history of nicotine dependence: Secondary | ICD-10-CM | POA: Insufficient documentation

## 2014-11-10 DIAGNOSIS — E119 Type 2 diabetes mellitus without complications: Secondary | ICD-10-CM | POA: Diagnosis not present

## 2014-11-10 DIAGNOSIS — I2510 Atherosclerotic heart disease of native coronary artery without angina pectoris: Secondary | ICD-10-CM

## 2014-11-10 DIAGNOSIS — R002 Palpitations: Secondary | ICD-10-CM | POA: Diagnosis not present

## 2014-11-10 DIAGNOSIS — I2584 Coronary atherosclerosis due to calcified coronary lesion: Secondary | ICD-10-CM

## 2014-11-10 DIAGNOSIS — I10 Essential (primary) hypertension: Secondary | ICD-10-CM | POA: Insufficient documentation

## 2014-11-10 MED ORDER — REGADENOSON 0.4 MG/5ML IV SOLN
0.4000 mg | Freq: Once | INTRAVENOUS | Status: AC
  Administered 2014-11-10: 0.4 mg via INTRAVENOUS

## 2014-11-10 MED ORDER — TECHNETIUM TC 99M SESTAMIBI GENERIC - CARDIOLITE
31.6000 | Freq: Once | INTRAVENOUS | Status: AC | PRN
  Administered 2014-11-10: 32 via INTRAVENOUS

## 2014-11-10 NOTE — Procedures (Addendum)
[facility] [address] [place] [address] [phone]  Cardiology Nuclear Med Study  Tammy [name] is a 64 y.o. 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 Weight:  267 lb (121.11 kg)   Tech Comments:  n/a    Nuclear Med Study 1 or 2 day study: 2 day  Stress Test Type:  [place] Provider:  [name], MD   Resting Radionuclide: Technetium 11m Sestamibi  Resting Radionuclide Dose: 32.8 mCi   Stress Radionuclide:  Technetium 25m Sestamibi  Stress Radionuclide Dose: 31.6 mCi           Stress Protocol Rest HR: 88 Stress HR: 90  Rest BP: 128/61 Stress BP: 135/65  Exercise Time (min): n/a METS: n/a          Dose of Adenosine (mg):  n/a Dose of Lexiscan: 0.4 mg  Dose of Atropine (mg): n/a Dose of Dobutamine: n/a mcg/kg/min (at max HR)  Stress Test Technologist: Leane Para, CCT  Nuclear Technologist:Pam Phillips,CNMT   Rest Procedure:  Myocardial perfusion imaging was performed at rest 45 minutes following the intravenous administration of Technetium 27m Sestamibi. Stress Procedure:  The patient received IV Lexiscan 0.4 mg over 15-seconds.  Technetium 60m Sestamibi injected  IV at 30-seconds.  There were no significant changes with Lexiscan.   Quantitative spect images were obtained after a 45 minute delay.  Transient Ischemic Dilatation (Normal <1.22):  1.12 QGS EDV:100 QGS ESV:  38 ml LV Ejection Fraction: 63%    Rest ECG: NSR - Normal EKG  Stress ECG: No significant ST segment change suggestive of ischemia.  QPS Raw Data Images:  Mild breast attenuation.  Normal left ventricular size. Stress Images:  There is decreased uptake in the anterior wall. Rest Images:  There is decreased uptake in the anterior wall. Subtraction (SDS):  No evidence of ischemia.  Impression Exercise Capacity:  Lexiscan with no exercise. BP Response:  Normal blood pressure response. Clinical Symptoms:  There is dyspnea. ECG Impression:  No significant ST segment change suggestive of ischemia. Comparison with Prior Nuclear Study: No previous nuclear study performed  Overall Impression:  Low risk stress nuclear study with a small, mild, fixed anterior defect consistent with breast attenuation; no ischemia.  LV Wall Motion:  NL LV Function; NL Wall Motion   Kirk Ruths, MD  11/11/2014 5:14 PM

## 2014-11-11 ENCOUNTER — Ambulatory Visit (HOSPITAL_COMMUNITY)
Admission: RE | Admit: 2014-11-11 | Discharge: 2014-11-11 | Disposition: A | Discharge status: Home / Self Care | Source: Ambulatory Visit | Attending: Cardiology | Admitting: Cardiology

## 2014-11-11 DIAGNOSIS — Z87891 Personal history of nicotine dependence: Secondary | ICD-10-CM | POA: Diagnosis not present

## 2014-11-11 DIAGNOSIS — I2510 Atherosclerotic heart disease of native coronary artery without angina pectoris: Secondary | ICD-10-CM

## 2014-11-11 DIAGNOSIS — Z8249 Family history of ischemic heart disease and other diseases of the circulatory system: Secondary | ICD-10-CM | POA: Insufficient documentation

## 2014-11-11 DIAGNOSIS — E119 Type 2 diabetes mellitus without complications: Secondary | ICD-10-CM | POA: Insufficient documentation

## 2014-11-11 DIAGNOSIS — I10 Essential (primary) hypertension: Secondary | ICD-10-CM | POA: Insufficient documentation

## 2014-11-11 DIAGNOSIS — I2584 Coronary atherosclerosis due to calcified coronary lesion: Secondary | ICD-10-CM

## 2014-11-11 DIAGNOSIS — R42 Dizziness and giddiness: Secondary | ICD-10-CM | POA: Diagnosis not present

## 2014-11-11 DIAGNOSIS — E669 Obesity, unspecified: Secondary | ICD-10-CM | POA: Diagnosis not present

## 2014-11-11 DIAGNOSIS — R0609 Other forms of dyspnea: Secondary | ICD-10-CM | POA: Diagnosis present

## 2014-11-11 DIAGNOSIS — J449 Chronic obstructive pulmonary disease, unspecified: Secondary | ICD-10-CM | POA: Insufficient documentation

## 2014-11-11 DIAGNOSIS — Z6841 Body Mass Index (BMI) 40.0 and over, adult: Secondary | ICD-10-CM | POA: Insufficient documentation

## 2014-11-11 DIAGNOSIS — R002 Palpitations: Secondary | ICD-10-CM | POA: Insufficient documentation

## 2014-11-11 MED ORDER — TECHNETIUM TC 99M SESTAMIBI GENERIC - CARDIOLITE
32.8000 | Freq: Once | INTRAVENOUS | Status: AC | PRN
  Administered 2014-11-11: 32.8 via INTRAVENOUS

## 2015-04-19 ENCOUNTER — Encounter: Payer: Self-pay | Admitting: Cardiology

## 2015-04-19 ENCOUNTER — Ambulatory Visit (INDEPENDENT_AMBULATORY_CARE_PROVIDER_SITE_OTHER): Admitting: Cardiology

## 2015-04-19 VITALS — BP 114/72 | HR 84 | Ht 63.0 in | Wt 264.0 lb

## 2015-04-19 DIAGNOSIS — I2510 Atherosclerotic heart disease of native coronary artery without angina pectoris: Secondary | ICD-10-CM | POA: Diagnosis not present

## 2015-04-19 DIAGNOSIS — R931 Abnormal findings on diagnostic imaging of heart and coronary circulation: Secondary | ICD-10-CM

## 2015-04-19 NOTE — Patient Instructions (Signed)
Your physician wants you to follow-up in: 1 Year. You will receive a reminder letter in the mail two months in advance. If you don't receive a letter, please call our office to schedule the follow-up appointment.  Your physician recommends that you return for lab work in: [place] recommends that you schedule a follow-up appointment in: [place] 2-3 weeks lipid clinic  Your physician has requested that you have an exercise tolerance test. For further information please visit [email or web address]. Please also follow instruction sheet, as given. April, 2017

## 2015-04-19 NOTE — Progress Notes (Signed)
Cardiology Office Note   Date:  04/19/2015   ID:  Tammy [name], DOB 07-03-1951, MRN [id number]  PCP:  [name], MD  Cardiologist:   [name], MD   Chief Complaint  Patient presents with  . Follow-up     6 months:  no complaints of chest pain, SOB,or dizziness.  Occas. lower extremity edema.      History of Present Illness: Tammy [name] is a 64 y.o. female who presents for evaluation of calcium. She has no past cardiac history. Recently she had a coronary calcium score and I was able to review this. Her level is greater than 99 percentile for age. She has been more active recently and can mow the lawn.. She's not describing any chest pressure, neck or arm discomfort.  She's not having any presyncope or syncope. She has no shortness of breath, PND or orthopnea. There've been no edema or weight gain.  She does have some leg cramping.     Past Medical History  Diagnosis Date  . Agatston coronary artery calcium score greater than 400   . Sleep apnea     No CPAP  . HTN (hypertension)   . Diabetes mellitus ([place])     x 3 years  . Hyperlipidemia   . DJD (degenerative joint disease)   . Encephalitis     Past Surgical History  Procedure Laterality Date  . Knee arthroscopy    . Appendectomy    . Breast cyst excision       Current Outpatient Prescriptions  Medication Sig Dispense Refill  . amLODipine-valsartan (EXFORGE) 10-320 MG per tablet Take 1 tablet by mouth daily.    Marland Kitchen aspirin 81 MG tablet Take 81 mg by mouth daily.    . beta carotene w/minerals (OCUVITE) tablet Take 1 tablet by mouth daily.    . DULoxetine (CYMBALTA) 60 MG capsule Take 60 mg by mouth daily.    Marland Kitchen EPINEPHrine 0.3 mg/0.3 mL IJ SOAJ injection Inject 0.3 mg into the muscle as needed.    . fesoterodine (TOVIAZ) 4 MG TB24 tablet Take 4 mg by mouth daily.    . metFORMIN (GLUCOPHAGE) 1000 MG tablet Take 1,000 mg by mouth 2 (two) times daily with a meal.    . naproxen (NAPROSYN) 500 MG tablet  Take 500 mg by mouth as needed.    . traMADol (ULTRAM) 50 MG tablet Take 50 mg by mouth as needed.     No current facility-administered medications for this visit.    Allergies:   Betadine; Contrast media; Iodine; and Shellfish allergy    ROS:  Please see the history of present illness.   Otherwise, review of systems are positive for knee pain.   All other systems are reviewed and negative.    PHYSICAL EXAM: VS:  BP 114/72 mmHg  Pulse 84  Ht 5\' 3"  (1.6 m)  Wt 264 lb (119.75 kg)  BMI 46.78 kg/m2 , BMI Body mass index is 46.78 kg/(m^2). GENERAL:  Well appearing HEENT:  Pupils equal round and reactive, fundi not visualized, oral mucosa unremarkable NECK:  No jugular venous distention, waveform within normal limits, carotid upstroke brisk and symmetric, no bruits, no thyromegaly LYMPHATICS:  No cervical, inguinal adenopathy LUNGS:  Clear to auscultation bilaterally BACK:  No CVA tenderness CHEST:  Unremarkable HEART:  PMI not displaced or sustained,S1 and S2 within normal limits, no S3, no S4, no clicks, no rubs, no murmurs ABD:  Flat, positive bowel sounds normal in frequency in pitch, no bruits,  no rebound, no guarding, no midline pulsatile mass, no hepatomegaly, no splenomegaly EXT:  2 plus pulses throughout, no edema, no cyanosis no clubbing SKIN:  No rashes no nodules NEURO:  Cranial nerves II through XII grossly intact, motor grossly intact throughout PSYCH:  Cognitively intact, oriented to person place and time    EKG:  EKG is ordered today. The ekg ordered today demonstrates sinus rhythm, rate 84, axis within normal limits, intervals within normal limits, no acute ST-T wave changes.   Recent Labs: No results found for requested labs within last 365 days.    Lipid Panel No results found for: CHOL, TRIG, HDL, CHOLHDL, VLDL, LDLCALC, LDLDIRECT    Wt Readings from Last 3 Encounters:  04/19/15 264 lb (119.75 kg)  11/10/14 267 lb (121.11 kg)  11/02/14 267 lb 1.6 oz  (121.156 kg)      Other studies Reviewed: Additional studies/ records that were reviewed today include: None.    ASSESSMENT AND PLAN:  CORONARY CALCIUM:  The patient has no overt symptoms.  However, she needs aggressive risk reduction. Her sugar is well-controlled she tells me with a hemoglobin A1c less than 6. Her blood pressure is controlled. She's not smoking cigarettes. I'm going to screen with a treadmill test in April of next year which will be one year and probably screening stress testing yearly. She'll have her cholesterol aggressively addressed. We talked about exercise.  DYSLIPIDEMIA:  She apparently is intolerant of statins. I will check a [facility] profile and refer her to our lipid clinic for PCSK9 inhibition.  OBESITY:  The patient understands the need to lose weight with diet and exercise. We have discussed specific strategies for this.   Current medicines are reviewed at length with the patient today.  The patient does not have concerns regarding medicines.  The following changes have been made:  She will be referred as above.  Labs/ tests ordered today include:   No orders of the defined types were placed in this encounter.     Disposition:   FU with me in 12 months.     Signed, [name], MD  04/19/2015 10:53 AM    [facility]

## 2015-04-23 ENCOUNTER — Telehealth: Payer: Self-pay | Admitting: Cardiology

## 2015-04-23 DIAGNOSIS — R931 Abnormal findings on diagnostic imaging of heart and coronary circulation: Secondary | ICD-10-CM

## 2015-04-23 NOTE — Telephone Encounter (Signed)
CANCELLED [place] [facility]

## 2015-04-27 LAB — CARDIO IQ(R) ADVANCED LIPID PANEL
Apolipoprotein B: 149 mg/dL — ABNORMAL HIGH (ref 49–103)
Cholesterol, Total: 285 mg/dL — ABNORMAL HIGH (ref 125–200)
Cholesterol/HDL Ratio: 5.7 calc — ABNORMAL HIGH (ref <=5.0)
HDL Cholesterol: 50 mg/dL (ref >=46)
LDL Large: 5399 nmol/L (ref 5038–17886)
LDL Medium: 386 nmol/L (ref 121–397)
LDL Particle Number: 1879 nmol/L (ref 1016–2185)
LDL Peak Size: 224.2 Angstrom (ref >=218.2)
LDL Small: 161 nmol/L (ref 115–386)
LDL, Calculated: 207 mg/dL — ABNORMAL HIGH
Lipoprotein (a): 304 nmol/L — ABNORMAL HIGH (ref <75)
Non-HDL Cholesterol: 235 mg/dL
Triglycerides: 140 mg/dL

## 2015-04-30 ENCOUNTER — Telehealth: Payer: Self-pay | Admitting: Cardiology

## 2015-04-30 NOTE — Telephone Encounter (Signed)
Returning your call. °

## 2015-05-03 NOTE — Telephone Encounter (Signed)
Returning your call,after 10 please call [phone].

## 2015-05-20 ENCOUNTER — Ambulatory Visit (INDEPENDENT_AMBULATORY_CARE_PROVIDER_SITE_OTHER): Admitting: Pharmacist Clinician (PhC)/ Clinical Pharmacy Specialist

## 2015-05-20 ENCOUNTER — Encounter: Payer: Self-pay | Admitting: Cardiology

## 2015-05-20 ENCOUNTER — Encounter: Payer: Self-pay | Admitting: Pharmacist Clinician (PhC)/ Clinical Pharmacy Specialist

## 2015-05-20 VITALS — Ht 63.0 in | Wt 257.9 lb

## 2015-05-20 DIAGNOSIS — E785 Hyperlipidemia, unspecified: Secondary | ICD-10-CM | POA: Diagnosis not present

## 2015-05-20 NOTE — Patient Instructions (Addendum)
Start Crestor 5 mg once weekly.  If you develop muscle aches or pains please call me ([phone] - [place])  Repeat cholesterol labs about 3-4 days after last dose   Cholesterol Cholesterol is a fat. Your body needs a small amount of cholesterol. Cholesterol may build up in your blood vessels. This increases your chance of having a heart attack or stroke. You cannot feel your cholesterol levels. The only way to know your cholesterol level is high is with a blood test. Keep your test results. Work with your doctor to keep your cholesterol at a good level. WHAT DO THE TEST RESULTS MEAN?  Total cholesterol is how much cholesterol is in your blood.  LDL is bad cholesterol. This is the type that can build up. You want LDL to be low.  HDL is good cholesterol. It cleans your blood vessels and carries LDL away. You want HDL to be high.  Triglycerides are fat that the body can burn for energy or store. WHAT ARE GOOD LEVELS OF CHOLESTEROL?  Total cholesterol below 200.  LDL below 100 for people at risk. Below 70 for those at very high risk.  HDL above 50 is good. Above 60 is best.  Triglycerides below 150. HOW CAN I LOWER MY CHOLESTEROL?  Diet. Follow your diet programs as told by your doctor.  Choose fish, white meat chicken, roasted [place], or baked [place]. Try not to eat red meat, fried foods, or processed meats such as sausage and lunch meats.  Eat lots of fresh fruits and vegetables.  Choose whole grains, beans, pasta, potatoes, and cereals.  Use only small amounts of olive, corn, or canola oils.  Try not to eat butter, mayonnaise, shortening, or palm kernel oils.  Try not to eat foods with trans fats.  Drink skim or nonfat milk. Eat low-fat or nonfat yogurt and cheeses. Try not to drink whole milk or cream. Try not to eat ice cream, egg yolks, and full-fat cheeses.  Healthy desserts include angel food cake, ginger snaps, animal crackers, hard candy, popsicles, and low-fat  or nonfat frozen yogurt. Try not to eat pastries, cakes, pies, and cookies.  Exercise. Follow your exercise programs as told by your doctor.  Be more active. You can try gardening, walking, or taking the stairs. Ask your doctor about how you can be more active.  Medicine. Take medicine as told by your doctor.   This information is not intended to replace advice given to you by your health care provider. Make sure you discuss any questions you have with your health care provider.   Document Released: 09/29/2008 Document Revised: 07/24/2014 Document Reviewed: 04/16/2013 Elsevier Interactive Patient Education [employer].

## 2015-05-20 NOTE — Assessment & Plan Note (Signed)
Reviewed options with patient.  Unfortunately because she is primary prevention, insurance will not cover a PCSK-9.  She does have [employer], so I may try sending the request in with a letter documenting her coronary calcium score, but I don't believe they will consider it without an ASCVD event.  In the meantime, we spoke of trying Crestor 5 mg once weekly and she is open to trying this.  I will have her take it once weekly for about 6-8 weeks, then get her labs rechecked.  We also discussed dietary changes that can have an impact on choelsterol  I will call her for further planning in December when the lab results are in.  Also recommended that she try CoQ10 to help decrease risk of muscle aches with statin.

## 2015-05-20 NOTE — Progress Notes (Signed)
05/20/2015 Tammy [name] 03-Nov-1950 [id number]   HPI:  Tammy [name] is a 64 y.o. female patient of Dr Percival Spanish, who presents today for a lipid clinic evaluation.  She is a primary prevention patient, having no history of ASCVD or familial hyperlipidemia.  She did have a coronary calcium score back in February 2016 and was reported to have a level greater than 99th percentile for age.    RF: coronary calcium Agatston score 873 with MESA database percentile at 54  Meds: none currently   Intolerant: has tried multiple statins including crestor and livalo, all of which caused her to have leg pains and weakness.   Zetia caused similar side effects   Family history:  Mother living at 31 with pacemaker and TIA at 36; father had CABG (unknown age), the died at 39 from heart and kidney disease; brother had PCI with stent at age 4  Diet: states she could be a vegetarian if her husband would eat more vegetables.  Eats breads, but trying to cut back.  Has lost 6 pounds over the past month, and is trying to lose more.  Exercise: stays active with yard work, states that she is doing a lot of physical work, cleaning out her father's garage as well    Labs:  04/2015 -  TC 285, TG 140, HDL 50, LDL 207, LDL particle number 1879  (no medication)   Current Outpatient Prescriptions  Medication Sig Dispense Refill  . amLODipine-valsartan (EXFORGE) 10-320 MG per tablet Take 1 tablet by mouth daily.    Marland Kitchen aspirin 81 MG tablet Take 81 mg by mouth daily.    . beta carotene w/minerals (OCUVITE) tablet Take 1 tablet by mouth daily.    . DULoxetine (CYMBALTA) 60 MG capsule Take 60 mg by mouth daily.    Marland Kitchen EPINEPHrine 0.3 mg/0.3 mL IJ SOAJ injection Inject 0.3 mg into the muscle as needed.    . fesoterodine (TOVIAZ) 4 MG TB24 tablet Take 4 mg by mouth daily.    . metFORMIN (GLUCOPHAGE) 1000 MG tablet Take 1,000 mg by mouth 2 (two) times daily with a meal.     No current facility-administered medications for this  visit.    Allergies  Allergen Reactions  . Betadine [Povidone Iodine] Anaphylaxis  . Contrast Media [Iodinated Diagnostic Agents] Anaphylaxis  . Iodine Anaphylaxis  . Shellfish Allergy Anaphylaxis    Past Medical History  Diagnosis Date  . Agatston coronary artery calcium score greater than 400   . Sleep apnea     No CPAP  . HTN (hypertension)   . Diabetes mellitus ([place])     x 3 years  . Hyperlipidemia   . DJD (degenerative joint disease)   . Encephalitis     Height 5\' 3"  (1.6 m), weight 257 lb 14.4 oz (116.983 kg).   ASSESSMENT AND PLAN:  Tammy [name] PharmD CPP [facility]

## 2015-06-24 ENCOUNTER — Other Ambulatory Visit (HOSPITAL_COMMUNITY)

## 2015-06-29 ENCOUNTER — Ambulatory Visit (HOSPITAL_COMMUNITY): Admission: RE | Admit: 2015-06-29 | Source: Ambulatory Visit | Admitting: Ophthalmology

## 2015-06-29 ENCOUNTER — Encounter (HOSPITAL_COMMUNITY): Admission: RE | Payer: Self-pay | Source: Ambulatory Visit

## 2015-06-29 SURGERY — PHACOEMULSIFICATION, CATARACT, WITH IOL INSERTION
Anesthesia: Monitor Anesthesia Care | Laterality: Right

## 2015-07-23 ENCOUNTER — Other Ambulatory Visit (HOSPITAL_COMMUNITY)

## 2015-07-27 ENCOUNTER — Encounter (HOSPITAL_COMMUNITY): Admission: RE | Payer: Self-pay | Source: Ambulatory Visit

## 2015-07-27 ENCOUNTER — Ambulatory Visit (HOSPITAL_COMMUNITY): Admission: RE | Admit: 2015-07-27 | Source: Ambulatory Visit | Admitting: Ophthalmology

## 2015-07-27 SURGERY — PHACOEMULSIFICATION, CATARACT, WITH IOL INSERTION
Anesthesia: Monitor Anesthesia Care | Laterality: Left

## 2015-08-06 ENCOUNTER — Other Ambulatory Visit: Payer: Self-pay

## 2015-08-06 DIAGNOSIS — Z1231 Encounter for screening mammogram for malignant neoplasm of breast: Secondary | ICD-10-CM

## 2015-09-15 ENCOUNTER — Ambulatory Visit
Admission: RE | Admit: 2015-09-15 | Discharge: 2015-09-15 | Disposition: A | Discharge status: Home / Self Care | Source: Ambulatory Visit

## 2015-09-15 DIAGNOSIS — Z1231 Encounter for screening mammogram for malignant neoplasm of breast: Secondary | ICD-10-CM

## 2015-10-14 ENCOUNTER — Telehealth (HOSPITAL_COMMUNITY): Payer: Self-pay

## 2015-10-14 NOTE — Telephone Encounter (Signed)
Encounter complete. 

## 2015-10-19 ENCOUNTER — Ambulatory Visit (HOSPITAL_COMMUNITY)
Admission: RE | Admit: 2015-10-19 | Discharge: 2015-10-19 | Disposition: A | Discharge status: Home / Self Care | Source: Ambulatory Visit | Attending: Cardiovascular Disease | Admitting: Cardiovascular Disease

## 2015-10-19 DIAGNOSIS — R931 Abnormal findings on diagnostic imaging of heart and coronary circulation: Secondary | ICD-10-CM

## 2015-10-19 DIAGNOSIS — I2510 Atherosclerotic heart disease of native coronary artery without angina pectoris: Secondary | ICD-10-CM

## 2015-10-19 LAB — EXERCISE TOLERANCE TEST
Estimated workload: 7 METS
Exercise duration (min): 5 min
Exercise duration (sec): 31 s
MPHR: 156 {beats}/min
Peak HR: 139 {beats}/min
Percent HR: 89 %
RPE: 17
Rest HR: 89 {beats}/min

## 2016-01-04 ENCOUNTER — Other Ambulatory Visit: Payer: Self-pay | Admitting: Internal Medicine

## 2016-01-04 DIAGNOSIS — F17201 Nicotine dependence, unspecified, in remission: Secondary | ICD-10-CM

## 2016-01-12 ENCOUNTER — Ambulatory Visit
Admission: RE | Admit: 2016-01-12 | Discharge: 2016-01-12 | Disposition: A | Discharge status: Home / Self Care | Source: Ambulatory Visit | Attending: Internal Medicine | Admitting: Internal Medicine

## 2016-01-12 DIAGNOSIS — F17201 Nicotine dependence, unspecified, in remission: Secondary | ICD-10-CM

## 2016-02-05 ENCOUNTER — Other Ambulatory Visit: Payer: Self-pay | Admitting: Internal Medicine

## 2016-02-05 DIAGNOSIS — Z0000 Encounter for general adult medical examination without abnormal findings: Secondary | ICD-10-CM

## 2016-02-05 DIAGNOSIS — F17211 Nicotine dependence, cigarettes, in remission: Secondary | ICD-10-CM

## 2016-03-30 ENCOUNTER — Emergency Department (HOSPITAL_COMMUNITY)
Admission: EM | Admit: 2016-03-30 | Discharge: 2016-03-30 | Disposition: A | Discharge status: Home / Self Care | Attending: Emergency Medicine | Admitting: Emergency Medicine

## 2016-03-30 ENCOUNTER — Encounter (HOSPITAL_COMMUNITY): Payer: Self-pay | Admitting: Emergency Medicine

## 2016-03-30 DIAGNOSIS — E119 Type 2 diabetes mellitus without complications: Secondary | ICD-10-CM | POA: Diagnosis not present

## 2016-03-30 DIAGNOSIS — Z79899 Other long term (current) drug therapy: Secondary | ICD-10-CM | POA: Diagnosis not present

## 2016-03-30 DIAGNOSIS — Z87891 Personal history of nicotine dependence: Secondary | ICD-10-CM | POA: Diagnosis not present

## 2016-03-30 DIAGNOSIS — R52 Pain, unspecified: Secondary | ICD-10-CM

## 2016-03-30 DIAGNOSIS — Z7984 Long term (current) use of oral hypoglycemic drugs: Secondary | ICD-10-CM | POA: Diagnosis not present

## 2016-03-30 DIAGNOSIS — R300 Dysuria: Secondary | ICD-10-CM | POA: Diagnosis not present

## 2016-03-30 DIAGNOSIS — I10 Essential (primary) hypertension: Secondary | ICD-10-CM | POA: Insufficient documentation

## 2016-03-30 DIAGNOSIS — E860 Dehydration: Secondary | ICD-10-CM | POA: Diagnosis not present

## 2016-03-30 HISTORY — DX: Fibromyalgia: M79.7

## 2016-03-30 LAB — CBC WITH DIFFERENTIAL/PLATELET
Basophils Absolute: 0.1 10*3/uL (ref 0.0–0.1)
Basophils Relative: 1 %
Eosinophils Absolute: 1.5 10*3/uL — ABNORMAL HIGH (ref 0.0–0.7)
Eosinophils Relative: 14 %
HCT: 44.4 % (ref 36.0–46.0)
Hemoglobin: 14.8 g/dL (ref 12.0–15.0)
Lymphocytes Relative: 21 %
Lymphs Abs: 2.2 10*3/uL (ref 0.7–4.0)
MCH: 28.4 pg (ref 26.0–34.0)
MCHC: 33.3 g/dL (ref 30.0–36.0)
MCV: 85.1 fL (ref 78.0–100.0)
Monocytes Absolute: 0.8 10*3/uL (ref 0.1–1.0)
Monocytes Relative: 7 %
Neutro Abs: 5.9 10*3/uL (ref 1.7–7.7)
Neutrophils Relative %: 57 %
Platelets: 313 10*3/uL (ref 150–400)
RBC: 5.22 MIL/uL — ABNORMAL HIGH (ref 3.87–5.11)
RDW: 13.4 % (ref 11.5–15.5)
WBC: 10.5 10*3/uL (ref 4.0–10.5)

## 2016-03-30 LAB — BASIC METABOLIC PANEL
Anion gap: 9 (ref 5–15)
BUN: 15 mg/dL (ref 6–20)
CO2: 27 mmol/L (ref 22–32)
Calcium: 9.3 mg/dL (ref 8.9–10.3)
Chloride: 100 mmol/L — ABNORMAL LOW (ref 101–111)
Creatinine, Ser: 0.61 mg/dL (ref 0.44–1.00)
GFR calc Af Amer: >60 mL/min (ref >60)
GFR calc non Af Amer: >60 mL/min (ref >60)
Glucose, Bld: 121 mg/dL — ABNORMAL HIGH (ref 65–99)
Potassium: 3.8 mmol/L (ref 3.5–5.1)
Sodium: 136 mmol/L (ref 135–145)

## 2016-03-30 LAB — URINALYSIS, ROUTINE W REFLEX MICROSCOPIC
Bilirubin Urine: NEGATIVE
Glucose, UA: NEGATIVE mg/dL
Hgb urine dipstick: NEGATIVE
Ketones, ur: NEGATIVE mg/dL
Leukocytes, UA: NEGATIVE
Nitrite: NEGATIVE
Protein, ur: >300 mg/dL — AB
Specific Gravity, Urine: 1.02 (ref 1.005–1.030)
pH: 7.5 (ref 5.0–8.0)

## 2016-03-30 LAB — URINE MICROSCOPIC-ADD ON

## 2016-03-30 LAB — CK: Total CK: 50 U/L (ref 38–234)

## 2016-03-30 MED ORDER — HYDROCODONE-ACETAMINOPHEN 5-325 MG PO TABS
1.0000 | ORAL_TABLET | Freq: Once | ORAL | Status: AC
  Administered 2016-03-30: 1 via ORAL
  Filled 2016-03-30: qty 1

## 2016-03-30 MED ORDER — SODIUM CHLORIDE 0.9 % IV BOLUS (SEPSIS)
1000.0000 mL | Freq: Once | INTRAVENOUS | Status: AC
  Administered 2016-03-30: 1000 mL via INTRAVENOUS

## 2016-03-30 MED ORDER — CEPHALEXIN 500 MG PO CAPS: 500.0000 mg | ORAL_CAPSULE | Freq: Three times a day (TID) | ORAL | 0 refills | Status: DC

## 2016-03-30 NOTE — Discharge Instructions (Signed)
You were seen today for body aches and dysuria. He appears somewhat dehydrated. This may be contributing to your body aches. Your urinalysis is not definitively a urinary tract infection; however, urine cultures pending and you will be given antibiotics. If you develop fever or worsening symptoms you need to be reevaluated.

## 2016-03-30 NOTE — ED Notes (Signed)
Patient verbalizes understanding of discharge instructions, prescriptions, home care and follow up care. Patient out of department at this time with family. 

## 2016-03-30 NOTE — ED Triage Notes (Signed)
All over body aches and pains x1 week.  Pt states she thinks she has a uti,  Has had burning during urination.  Also reports smelling sweet smells intermittently.

## 2016-03-30 NOTE — ED Notes (Signed)
Patient asking for ice chips. EDP states that it is ok. Patient given ice chips at this time.

## 2016-03-30 NOTE — ED Provider Notes (Signed)
[facility] DEPT Provider Note   CSN: [id number] Arrival date & time: 03/30/16  0410     History   Chief Complaint Chief Complaint  Patient presents with  . Generalized Body Aches    HPI Tammy [name] is a 65 y.o. female.  HPI  This is a 78 are old female with history of diabetes, fibromyalgia, hypertension, hyperlipidemia who presents with body aches and dysuria. Patient reports 2 to three-day history of worsening whole-body aches. She rates her pain at 10 out of 10. She is not taking anything for the pain. She denies fevers. She reports dysuria. Denies flank pain. No chest pain, shortness of breath, nausea, vomiting, abdominal pain. Reports that she may not have been drinking as much as normal.  Past Medical History:  Diagnosis Date  . Agatston coronary artery calcium score greater than 400   . Diabetes mellitus ([place])    x 3 years  . DJD (degenerative joint disease)   . Encephalitis   . Fibromyalgia   . HTN (hypertension)   . Hyperlipidemia   . Sleep apnea    No CPAP    Patient Active Problem List   Diagnosis Date Noted  . Hyperlipidemia 05/20/2015  . Knee pain 06/06/2012    Past Surgical History:  Procedure Laterality Date  . APPENDECTOMY    . BREAST CYST EXCISION    . KNEE ARTHROSCOPY      OB History    No data available       Home Medications    Prior to Admission medications   Medication Sig Start Date End Date Taking? Authorizing Provider  amLODipine-valsartan (EXFORGE) 10-320 MG per tablet Take 1 tablet by mouth daily.   Yes Historical Provider, MD  aspirin 81 MG tablet Take 81 mg by mouth daily.   Yes Historical Provider, MD  beta carotene w/minerals (OCUVITE) tablet Take 1 tablet by mouth daily.   Yes Historical Provider, MD  DULoxetine (CYMBALTA) 60 MG capsule Take 60 mg by mouth daily.   Yes Historical Provider, MD  EPINEPHrine 0.3 mg/0.3 mL IJ SOAJ injection Inject 0.3 mg into the muscle as needed.   Yes Historical Provider, MD    Evolocumab (REPATHA [place]) Inject into the skin every morning. Every other week   Yes Historical Provider, MD  Exenatide (BYDUREON [place]) Inject into the skin once a week.   Yes Historical Provider, MD  fesoterodine (TOVIAZ) 4 MG TB24 tablet Take 4 mg by mouth daily.   Yes Historical Provider, MD  metFORMIN (GLUCOPHAGE) 1000 MG tablet Take 1,000 mg by mouth 2 (two) times daily with a meal.   Yes Historical Provider, MD  phentermine 37.5 MG capsule Take 37.5 mg by mouth every morning.   Yes Historical Provider, MD  cephALEXin (KEFLEX) 500 MG capsule Take 1 capsule (500 mg total) by mouth 3 (three) times daily. 03/30/16   Merryl Hacker, MD    Family History Family History  Problem Relation Age of Onset  . CAD Mother 67  . CAD Brother 54    Social History Social History  Substance Use Topics  . Smoking status: Former Smoker    Packs/day: 1.00    Years: 30.00    Types: Cigarettes    Quit date: 11/01/2008  . Smokeless tobacco: Never Used  . Alcohol use No     Allergies   Betadine [povidone iodine]; Contrast media [iodinated diagnostic agents]; Iodine; and Shellfish allergy   Review of Systems Review of Systems  Constitutional: Negative for fever.  Respiratory:  Negative for shortness of breath.   Cardiovascular: Negative for chest pain.  Gastrointestinal: Negative for abdominal pain, nausea and vomiting.  Genitourinary: Positive for dysuria and frequency. Negative for hematuria.  Musculoskeletal: Positive for myalgias. Negative for back pain.  All other systems reviewed and are negative.    Physical Exam Updated Vital Signs BP 148/63 (BP Location: Left Arm)   Pulse 115   Temp 98.5 F (36.9 C) (Oral)   Resp 20   Ht 5\' 2"  (1.575 m)   Wt 246 lb (111.6 kg)   SpO2 95%   BMI 44.99 kg/m   Physical Exam  Constitutional: She is oriented to person, place, and time. No distress.  HENT:  Head: Normocephalic and atraumatic.  Mucous membranes dry  Cardiovascular: Normal rate  and normal heart sounds.   Tachycardia  Pulmonary/Chest: Effort normal and breath sounds normal. No respiratory distress. She has no wheezes.  Abdominal: Soft. Bowel sounds are normal. There is no tenderness. There is no guarding.  Musculoskeletal: She exhibits no edema.  Tenderness to palpation over her large muscle groups bilateral upper and lower extremities, no crepitus, no overlying skin changes  Neurological: She is alert and oriented to person, place, and time.  Skin: Skin is warm and dry.  Psychiatric: She has a normal mood and affect.  Nursing note and vitals reviewed.    ED Treatments / Results  Labs (all labs ordered are listed, but only abnormal results are displayed) Labs Reviewed  CBC WITH DIFFERENTIAL/PLATELET - Abnormal; Notable for the following:       Result Value   RBC 5.22 (*)    Eosinophils Absolute 1.5 (*)    All other components within normal limits  BASIC METABOLIC PANEL - Abnormal; Notable for the following:    Chloride 100 (*)    Glucose, Bld 121 (*)    All other components within normal limits  URINALYSIS, ROUTINE W REFLEX MICROSCOPIC (NOT AT [facility]) - Abnormal; Notable for the following:    Protein, ur >300 (*)    All other components within normal limits  URINE MICROSCOPIC-ADD ON - Abnormal; Notable for the following:    Squamous Epithelial / LPF 0-5 (*)    Bacteria, UA MANY (*)    All other components within normal limits  URINE CULTURE  CK    EKG  EKG Interpretation None       Radiology No results found.  Procedures Procedures (including critical care time)  Medications Ordered in ED Medications  sodium chloride 0.9 % bolus 1,000 mL (1,000 mLs Intravenous New Bag/Given 03/30/16 0506)  HYDROcodone-acetaminophen (NORCO/VICODIN) 5-325 MG per tablet 1 tablet (1 tablet Oral Given 03/30/16 0502)     Initial Impression / Assessment and Plan / ED Course  I have reviewed the triage vital signs and the nursing notes.  Pertinent labs &  imaging results that were available during my care of the patient were reviewed by me and considered in my medical decision making (see chart for details).  Clinical Course    Patient presents with generalized bodyaches and dysuria. Nontoxic. Afebrile. Mildly tachycardic. Reports baseline heart rate is 100. Basic labwork obtained to screen for metabolic derangements. CK and potassium are normal. Urinalysis shows only 0-5 white cells but many bacteria.  Urine culture sent and is pending. Given patient's symptoms, will elect to treat. Patient reports improvement after fluids and pain medication. Encouraged close primary care follow-up and aggressive hydration at home.  After history, exam, and medical workup I feel the patient has  been appropriately medically screened and is safe for discharge home. Pertinent diagnoses were discussed with the patient. Patient was given return precautions.   Final Clinical Impressions(s) / ED Diagnoses   Final diagnoses:  Body aches  Dysuria  Dehydration    New Prescriptions New Prescriptions   CEPHALEXIN (KEFLEX) 500 MG CAPSULE    Take 1 capsule (500 mg total) by mouth 3 (three) times daily.     Merryl Hacker, MD 03/30/16 [phone]

## 2016-03-31 LAB — URINE CULTURE

## 2016-04-17 NOTE — Progress Notes (Signed)
Cardiology Office Note   Date:  04/18/2016   ID:  Tammy [name], DOB October 27, 1950, MRN [id number]  PCP:  [name], MD  Cardiologist:   [name], MD   Chief Complaint  Patient presents with  . Coronary Calcium      History of Present Illness: Tammy [name] is a 65 y.o. female who presents for evaluation of calcium. Marland Kitchen Her level is greater than 99 percentile for age.  I sent her for a POET (Plain Old Exercise Treadmill) in April that was negative for evidence of ischemia.  Since I last saw her she has done well.  The patient denies any new symptoms such as chest discomfort, neck or arm discomfort. There has been no new shortness of breath, PND or orthopnea. There have been no reported palpitations, presyncope or syncope.  She is being very active in her yard.    Past Medical History:  Diagnosis Date  . Agatston coronary artery calcium score greater than 400   . Diabetes mellitus ([place])    x 3 years  . DJD (degenerative joint disease)   . Encephalitis   . Fibromyalgia   . HTN (hypertension)   . Hyperlipidemia   . Sleep apnea    No CPAP    Past Surgical History:  Procedure Laterality Date  . APPENDECTOMY    . BREAST CYST EXCISION    . KNEE ARTHROSCOPY       Current Outpatient Prescriptions  Medication Sig Dispense Refill  . amLODipine-valsartan (EXFORGE) 10-320 MG per tablet Take 1 tablet by mouth daily.    Marland Kitchen aspirin 81 MG tablet Take 81 mg by mouth daily.    . beta carotene w/minerals (OCUVITE) tablet Take 1 tablet by mouth daily.    . DULoxetine (CYMBALTA) 60 MG capsule Take 60 mg by mouth daily.    Marland Kitchen EPINEPHrine 0.3 mg/0.3 mL IJ SOAJ injection Inject 0.3 mg into the muscle as needed.    . Evolocumab (REPATHA [place]) Inject into the skin every morning. Every other week    . Exenatide (BYDUREON [place]) Inject into the skin once a week.    . fesoterodine (TOVIAZ) 4 MG TB24 tablet Take 4 mg by mouth daily.    . metFORMIN (GLUCOPHAGE) 1000 MG tablet Take 1,000 mg by  mouth 2 (two) times daily with a meal.    . phentermine 37.5 MG capsule Take 37.5 mg by mouth every morning.     No current facility-administered medications for this visit.     Allergies:   Betadine [povidone iodine]; Contrast media [iodinated diagnostic agents]; Iodine; Povidone-iodine; Shellfish allergy; and Hydrocodone-acetaminophen    ROS:  Please see the history of present illness.   Otherwise, review of systems are positive for none.   All other systems are reviewed and negative.    PHYSICAL EXAM: VS:  BP (!) 114/58 (BP Location: Left Arm, Patient Position: Sitting, Cuff Size: Large)   Pulse (!) 106   Ht 5\' 2"  (1.575 m)   Wt 252 lb (114.3 kg)   BMI 46.09 kg/m  , BMI Body mass index is 46.09 kg/m. GENERAL:  Well appearing HEENT:  Pupils equal round and reactive, fundi not visualized, oral mucosa unremarkable NECK:  No jugular venous distention, waveform within normal limits, carotid upstroke brisk and symmetric, no bruits, no thyromegaly LYMPHATICS:  No cervical, inguinal adenopathy LUNGS:  Clear to auscultation bilaterally BACK:  No CVA tenderness CHEST:  Unremarkable HEART:  PMI not displaced or sustained,S1 and S2 within normal limits, no  S3, no S4, no clicks, no rubs, no murmurs ABD:  Flat, positive bowel sounds normal in frequency in pitch, no bruits, no rebound, no guarding, no midline pulsatile mass, no hepatomegaly, no splenomegaly EXT:  2 plus pulses throughout, no edema, no cyanosis no clubbing SKIN:  No rashes no nodules NEURO:  Cranial nerves II through XII grossly intact, motor grossly intact throughout PSYCH:  Cognitively intact, oriented to person place and time    EKG:  EKG is  ordered today. The ekg ordered today demonstrates sinus rhythm, rate 106, axis within normal limits, intervals within normal limits, no acute ST-T wave changes.   Recent Labs: 03/30/2016: BUN 15; Creatinine, Ser 0.61; Hemoglobin 14.8; Platelets 313; Potassium 3.8; Sodium 136     Lipid Panel    Component Value Date/Time   CHOL 285 (H) 04/23/2015 1144   TRIG 140 04/23/2015 1144   HDL 50 04/23/2015 1144   CHOLHDL 5.7 (H) 04/23/2015 1144   LDLCALC 207 (H) 04/23/2015 1144      Wt Readings from Last 3 Encounters:  04/18/16 252 lb (114.3 kg)  03/30/16 246 lb (111.6 kg)  05/20/15 257 lb 14.4 oz (117 kg)      Other studies Reviewed: Additional studies/ records that were reviewed today include: None.    ASSESSMENT AND PLAN:  CORONARY CALCIUM:  The patient has no overt symptoms.  We will continue with aggressive reduction.  I will bring her back in April for follow up POET (Plain Old Exercise Treadmill).    DYSLIPIDEMIA:   She is now on Repatha per [name], MD.  I will defer to his management.   OBESITY:  The patient understands the need to lose weight with diet and exercise. We have discussed specific strategies for this.   Current medicines are reviewed at length with the patient today.  The patient does not have concerns regarding medicines.  The following changes have been made:  She will be referred as above.  Labs/ tests ordered today include:    Orders Placed This Encounter  Procedures  . EXERCISE TOLERANCE TEST  . EKG 12-Lead     Disposition:   FU with me in April .     Signed, [name], MD  04/18/2016 2:26 PM    [facility]

## 2016-04-18 ENCOUNTER — Encounter: Payer: Self-pay | Admitting: Cardiology

## 2016-04-18 ENCOUNTER — Ambulatory Visit (INDEPENDENT_AMBULATORY_CARE_PROVIDER_SITE_OTHER): Admitting: Cardiology

## 2016-04-18 VITALS — BP 114/58 | HR 106 | Ht 62.0 in | Wt 252.0 lb

## 2016-04-18 DIAGNOSIS — I2510 Atherosclerotic heart disease of native coronary artery without angina pectoris: Secondary | ICD-10-CM

## 2016-04-18 DIAGNOSIS — Z79899 Other long term (current) drug therapy: Secondary | ICD-10-CM

## 2016-04-18 DIAGNOSIS — I2584 Coronary atherosclerosis due to calcified coronary lesion: Secondary | ICD-10-CM | POA: Diagnosis not present

## 2016-04-18 NOTE — Patient Instructions (Signed)
Medication Instructions:  Continue current medications  Labwork: None Ordered  Testing/Procedures: Your physician has requested that you have an exercise tolerance test in April 2018. For further information please visit [email or web address]. Please also follow instruction sheet, as given.  Follow-Up: Your physician recommends that you schedule a follow-up appointment in: April after stress test   Any Other Special Instructions Will Be Listed Below (If Applicable).   If you need a refill on your cardiac medications before your next appointment, please call your pharmacy.

## 2016-08-23 DIAGNOSIS — E784 Other hyperlipidemia: Secondary | ICD-10-CM | POA: Diagnosis not present

## 2016-08-23 DIAGNOSIS — I10 Essential (primary) hypertension: Secondary | ICD-10-CM | POA: Diagnosis not present

## 2016-08-23 DIAGNOSIS — E1149 Type 2 diabetes mellitus with other diabetic neurological complication: Secondary | ICD-10-CM | POA: Diagnosis not present

## 2016-08-23 DIAGNOSIS — R8299 Other abnormal findings in urine: Secondary | ICD-10-CM | POA: Diagnosis not present

## 2016-08-28 DIAGNOSIS — Z1212 Encounter for screening for malignant neoplasm of rectum: Secondary | ICD-10-CM | POA: Diagnosis not present

## 2016-08-30 DIAGNOSIS — Z1389 Encounter for screening for other disorder: Secondary | ICD-10-CM | POA: Diagnosis not present

## 2016-08-30 DIAGNOSIS — I10 Essential (primary) hypertension: Secondary | ICD-10-CM | POA: Diagnosis not present

## 2016-08-30 DIAGNOSIS — G608 Other hereditary and idiopathic neuropathies: Secondary | ICD-10-CM | POA: Diagnosis not present

## 2016-08-30 DIAGNOSIS — Z0000 Encounter for general adult medical examination without abnormal findings: Secondary | ICD-10-CM | POA: Diagnosis not present

## 2016-08-30 DIAGNOSIS — E1149 Type 2 diabetes mellitus with other diabetic neurological complication: Secondary | ICD-10-CM | POA: Diagnosis not present

## 2016-08-30 DIAGNOSIS — E784 Other hyperlipidemia: Secondary | ICD-10-CM | POA: Diagnosis not present

## 2016-08-30 DIAGNOSIS — R808 Other proteinuria: Secondary | ICD-10-CM | POA: Diagnosis not present

## 2016-08-30 DIAGNOSIS — G4733 Obstructive sleep apnea (adult) (pediatric): Secondary | ICD-10-CM | POA: Diagnosis not present

## 2016-08-30 DIAGNOSIS — E1129 Type 2 diabetes mellitus with other diabetic kidney complication: Secondary | ICD-10-CM | POA: Diagnosis not present

## 2016-08-30 DIAGNOSIS — R809 Proteinuria, unspecified: Secondary | ICD-10-CM | POA: Diagnosis not present

## 2016-08-30 DIAGNOSIS — I2510 Atherosclerotic heart disease of native coronary artery without angina pectoris: Secondary | ICD-10-CM | POA: Diagnosis not present

## 2016-08-30 DIAGNOSIS — Z6841 Body Mass Index (BMI) 40.0 and over, adult: Secondary | ICD-10-CM | POA: Diagnosis not present

## 2016-09-06 DIAGNOSIS — R809 Proteinuria, unspecified: Secondary | ICD-10-CM | POA: Diagnosis not present

## 2016-09-06 DIAGNOSIS — E1149 Type 2 diabetes mellitus with other diabetic neurological complication: Secondary | ICD-10-CM | POA: Diagnosis not present

## 2016-09-06 DIAGNOSIS — E1129 Type 2 diabetes mellitus with other diabetic kidney complication: Secondary | ICD-10-CM | POA: Diagnosis not present

## 2016-09-11 DIAGNOSIS — R809 Proteinuria, unspecified: Secondary | ICD-10-CM | POA: Diagnosis not present

## 2016-09-27 DIAGNOSIS — Z23 Encounter for immunization: Secondary | ICD-10-CM | POA: Diagnosis not present

## 2016-09-28 DIAGNOSIS — E1129 Type 2 diabetes mellitus with other diabetic kidney complication: Secondary | ICD-10-CM | POA: Diagnosis not present

## 2016-09-28 DIAGNOSIS — R222 Localized swelling, mass and lump, trunk: Secondary | ICD-10-CM | POA: Diagnosis not present

## 2016-09-28 DIAGNOSIS — R809 Proteinuria, unspecified: Secondary | ICD-10-CM | POA: Diagnosis not present

## 2016-09-28 DIAGNOSIS — I129 Hypertensive chronic kidney disease with stage 1 through stage 4 chronic kidney disease, or unspecified chronic kidney disease: Secondary | ICD-10-CM | POA: Diagnosis not present

## 2016-09-28 DIAGNOSIS — R3129 Other microscopic hematuria: Secondary | ICD-10-CM | POA: Diagnosis not present

## 2016-10-17 DIAGNOSIS — R3129 Other microscopic hematuria: Secondary | ICD-10-CM | POA: Diagnosis not present

## 2016-10-19 DIAGNOSIS — R3129 Other microscopic hematuria: Secondary | ICD-10-CM | POA: Diagnosis not present

## 2016-10-25 ENCOUNTER — Telehealth (HOSPITAL_COMMUNITY): Payer: Self-pay | Admitting: Cardiology

## 2016-10-26 NOTE — Telephone Encounter (Signed)
  10/25/2016 02:17 PM Phone (Outgoing) Tammy [name], Tammy [name] (Self) [phone] (M)   Left Message - Called pt and lmsg for her to CB to sch ETT    By Verdene Rio

## 2016-10-27 ENCOUNTER — Telehealth (HOSPITAL_COMMUNITY): Payer: Self-pay

## 2016-10-27 NOTE — Telephone Encounter (Signed)
UTR Encounter complete. 

## 2016-10-30 ENCOUNTER — Other Ambulatory Visit: Payer: Self-pay | Admitting: Internal Medicine

## 2016-10-30 DIAGNOSIS — Z1231 Encounter for screening mammogram for malignant neoplasm of breast: Secondary | ICD-10-CM

## 2016-10-31 ENCOUNTER — Ambulatory Visit (HOSPITAL_COMMUNITY)
Admission: RE | Admit: 2016-10-31 | Discharge: 2016-10-31 | Disposition: A | Discharge status: Home / Self Care | Payer: Medicare Other | Source: Ambulatory Visit | Attending: Cardiology | Admitting: Cardiology

## 2016-10-31 DIAGNOSIS — I2584 Coronary atherosclerosis due to calcified coronary lesion: Secondary | ICD-10-CM | POA: Diagnosis not present

## 2016-10-31 DIAGNOSIS — I2510 Atherosclerotic heart disease of native coronary artery without angina pectoris: Secondary | ICD-10-CM | POA: Insufficient documentation

## 2016-10-31 LAB — EXERCISE TOLERANCE TEST
Estimated workload: 6.5 METS
Exercise duration (min): 4 min
Exercise duration (sec): 41 s
MPHR: 155 {beats}/min
Peak HR: 148 {beats}/min
Percent HR: 95 %
RPE: 19
Rest HR: 111 {beats}/min

## 2016-11-08 DIAGNOSIS — R3129 Other microscopic hematuria: Secondary | ICD-10-CM | POA: Diagnosis not present

## 2016-11-08 DIAGNOSIS — R222 Localized swelling, mass and lump, trunk: Secondary | ICD-10-CM | POA: Diagnosis not present

## 2016-11-08 DIAGNOSIS — R809 Proteinuria, unspecified: Secondary | ICD-10-CM | POA: Diagnosis not present

## 2016-11-08 DIAGNOSIS — E1129 Type 2 diabetes mellitus with other diabetic kidney complication: Secondary | ICD-10-CM | POA: Diagnosis not present

## 2016-11-20 ENCOUNTER — Ambulatory Visit
Admission: RE | Admit: 2016-11-20 | Discharge: 2016-11-20 | Disposition: A | Discharge status: Home / Self Care | Source: Ambulatory Visit | Attending: Internal Medicine | Admitting: Internal Medicine

## 2016-11-20 DIAGNOSIS — Z1231 Encounter for screening mammogram for malignant neoplasm of breast: Secondary | ICD-10-CM | POA: Diagnosis not present

## 2016-11-22 ENCOUNTER — Ambulatory Visit: Payer: Medicare Other | Admitting: Podiatry

## 2016-11-22 NOTE — Progress Notes (Signed)
Cardiology Office Note   Date:  11/23/2016   ID:  [name], DOB 1951/01/28, MRN [id number]  PCP:  [name], MD  Cardiologist:   [name], MD   Chief Complaint  Patient presents with  . Neck Swelling      History of Present Illness: Tammy [name] is a 66 y.o. female who presents for evaluation of coronary calcium. Her level is greater than 99 percentile for age.  I sent her for a POET (Plain Old Exercise Treadmill) in April that was negative for evidence of ischemia.  She returns for follow up.  She's done relatively well. She's had some leg cramping. She recently had some proteinuria and she saw Dr. [name].  He noted some swelling in her neck and mention in particular of the vein and the patient points to the subclavian area. She had noticed some swelling there. She's not having any new shortness of breath, PND or orthopnea. She's not having any chest pressure, neck or arm discomfort. She does a lot of cleaning. She's going to the swimming full. She denies any lower extremity swelling. She's had no palpitations, presyncope or syncope.  Past Medical History:  Diagnosis Date  . Agatston coronary artery calcium score greater than 400   . Diabetes mellitus ([place])    x 3 years  . DJD (degenerative joint disease)   . Encephalitis   . Fibromyalgia   . HTN (hypertension)   . Hyperlipidemia   . Sleep apnea    No CPAP    Past Surgical History:  Procedure Laterality Date  . APPENDECTOMY    . BREAST CYST EXCISION    . BREAST EXCISIONAL BIOPSY Left 2004  . KNEE ARTHROSCOPY       Current Outpatient Prescriptions  Medication Sig Dispense Refill  . amLODipine-valsartan (EXFORGE) 10-320 MG per tablet Take 1 tablet by mouth daily.    Marland Kitchen aspirin 81 MG tablet Take 81 mg by mouth daily.    . beta carotene w/minerals (OCUVITE) tablet Take 1 tablet by mouth daily.    . DULoxetine (CYMBALTA) 60 MG capsule Take 60 mg by mouth daily.    Marland Kitchen EPINEPHrine 0.3 mg/0.3 mL IJ SOAJ  injection Inject 0.3 mg into the muscle as needed.    . Evolocumab (REPATHA [place]) Inject into the skin every morning. Every other week    . Exenatide (BYDUREON [place]) Inject into the skin once a week.    . fesoterodine (TOVIAZ) 4 MG TB24 tablet Take 4 mg by mouth daily.    . metFORMIN (GLUCOPHAGE) 1000 MG tablet Take 1,000 mg by mouth 2 (two) times daily with a meal.    . phentermine 37.5 MG capsule Take 37.5 mg by mouth every morning.     No current facility-administered medications for this visit.     Allergies:   Betadine [povidone iodine]; Contrast media [iodinated diagnostic agents]; Iodine; Povidone-iodine; Shellfish allergy; and Hydrocodone-acetaminophen    ROS:  Please see the history of present illness.   Otherwise, review of systems are positive for none.   All other systems are reviewed and negative.    PHYSICAL EXAM: VS:  BP 107/72 (BP Location: Right Arm, Patient Position: Sitting, Cuff Size: Large)   Pulse (!) 102   Ht 5\' 1"  (1.549 m)   Wt 245 lb (111.1 kg)   BMI 46.29 kg/m  , BMI Body mass index is 46.29 kg/m. GENERAL:  Well appearing NECK:  No jugular venous distention, waveform within normal limits, carotid upstroke brisk and  symmetric, no bruits, no thyromegaly LYMPHATICS:  No cervical, inguinal adenopathy LUNGS:  Few expiratory wheezes.  CHEST:  Unremarkable HEART:  PMI not displaced or sustained,S1 and S2 within normal limits, no S3, no S4, no clicks, no rubs, no murmurs ABD:  Flat, positive bowel sounds normal in frequency in pitch, no bruits, no rebound, no guarding, no midline pulsatile mass, no hepatomegaly, no splenomegaly EXT:  2 plus pulses throughout, no edema, no cyanosis no clubbing    EKG:  EKG is not ordered today.  Recent Labs: 03/30/2016: BUN 15; Creatinine, Ser 0.61; Hemoglobin 14.8; Platelets 313; Potassium 3.8; Sodium 136    Wt Readings from Last 3 Encounters:  11/23/16 245 lb (111.1 kg)  04/18/16 252 lb (114.3 kg)  03/30/16 246 lb (111.6 kg)       Other studies Reviewed: Additional studies/ records that were reviewed today include: POET (Plain Old Exercise Treadmill).    ASSESSMENT AND PLAN:  CORONARY CALCIUM:   The patient has no overt symptoms.   She had a negative POET (Plain Old Exercise Treadmill) last month. We will continue with aggressive reduction.  I will bring her back in April 2019 for follow up POET (Plain Old Exercise Treadmill).    DYSLIPIDEMIA:   She is now on Repatha per [name], MD.  She is on Repatha.  I will defer to his management.   OBESITY:   She has lost weight and I encourage more of the same.  NECK SWELLING:  She was found to have some swelling of her neck.  I do not appreciate this but I will order a venous Doppler to evaluate the subclavian vein.  Further testing will be based on this.    Current medicines are reviewed at length with the patient today.  The patient does not have concerns regarding medicines.  The following changes have been made:  She will be referred as above.  Labs/ tests ordered today include:    Orders Placed This Encounter  Procedures  . EXERCISE TOLERANCE TEST     Disposition:   FU with me in 12 months.[name], MD  11/23/2016 6:47 PM    [facility]

## 2016-11-23 ENCOUNTER — Encounter: Payer: Self-pay | Admitting: Cardiology

## 2016-11-23 ENCOUNTER — Ambulatory Visit (INDEPENDENT_AMBULATORY_CARE_PROVIDER_SITE_OTHER): Payer: Medicare Other | Admitting: Cardiology

## 2016-11-23 VITALS — BP 107/72 | HR 102 | Ht 61.0 in | Wt 245.0 lb

## 2016-11-23 DIAGNOSIS — R931 Abnormal findings on diagnostic imaging of heart and coronary circulation: Secondary | ICD-10-CM | POA: Diagnosis not present

## 2016-11-23 DIAGNOSIS — I2510 Atherosclerotic heart disease of native coronary artery without angina pectoris: Secondary | ICD-10-CM

## 2016-11-23 DIAGNOSIS — I2584 Coronary atherosclerosis due to calcified coronary lesion: Secondary | ICD-10-CM | POA: Diagnosis not present

## 2016-11-23 DIAGNOSIS — R221 Localized swelling, mass and lump, neck: Secondary | ICD-10-CM

## 2016-11-23 NOTE — Patient Instructions (Signed)
Medication Instructions:  Continue current mediccations  Labwork: None Ordered  Testing/Procedures: Your physician has requested that you have an exercise tolerance test 1 Year. For further information please visit [email or web address]. Please also follow instruction sheet, as given.  Your physician has requested that you have a upper extremity venous duplex. This test is an ultrasound of the veins in the legs or arms. It looks at venous blood flow that carries blood from the heart to the legs or arms. Allow one hour for a Lower Venous exam. Allow thirty minutes for an Upper Venous exam. There are no restrictions or special instructions.  Follow-Up: Your physician wants you to follow-up in: 1 Year. You will receive a reminder letter in the mail two months in advance. If you don't receive a letter, please call our office to schedule the follow-up appointment.   Any Other Special Instructions Will Be Listed Below (If Applicable).   If you need a refill on your cardiac medications before your next appointment, please call your pharmacy.

## 2016-11-29 ENCOUNTER — Telehealth (HOSPITAL_COMMUNITY): Payer: Self-pay | Admitting: Cardiology

## 2016-11-29 NOTE — Telephone Encounter (Signed)
I called pt to see if she wanted to get scheduled for her ETT that Dr. Percival Spanish wants her to have next year and she voiced that she would call back in to make that appt.

## 2016-11-30 ENCOUNTER — Ambulatory Visit (INDEPENDENT_AMBULATORY_CARE_PROVIDER_SITE_OTHER): Payer: Medicare Other

## 2016-11-30 ENCOUNTER — Encounter: Payer: Self-pay | Admitting: Podiatry

## 2016-11-30 ENCOUNTER — Ambulatory Visit (INDEPENDENT_AMBULATORY_CARE_PROVIDER_SITE_OTHER): Payer: Medicare Other | Admitting: Podiatry

## 2016-11-30 DIAGNOSIS — I2584 Coronary atherosclerosis due to calcified coronary lesion: Secondary | ICD-10-CM

## 2016-11-30 DIAGNOSIS — M216X9 Other acquired deformities of unspecified foot: Secondary | ICD-10-CM

## 2016-11-30 DIAGNOSIS — L84 Corns and callosities: Secondary | ICD-10-CM | POA: Diagnosis not present

## 2016-11-30 DIAGNOSIS — I2510 Atherosclerotic heart disease of native coronary artery without angina pectoris: Secondary | ICD-10-CM

## 2016-12-04 ENCOUNTER — Ambulatory Visit (HOSPITAL_COMMUNITY)
Admission: RE | Admit: 2016-12-04 | Discharge: 2016-12-04 | Disposition: A | Discharge status: Home / Self Care | Payer: Medicare Other | Source: Ambulatory Visit | Attending: Cardiovascular Disease | Admitting: Cardiovascular Disease

## 2016-12-04 DIAGNOSIS — Z87891 Personal history of nicotine dependence: Secondary | ICD-10-CM | POA: Insufficient documentation

## 2016-12-04 DIAGNOSIS — E119 Type 2 diabetes mellitus without complications: Secondary | ICD-10-CM | POA: Diagnosis not present

## 2016-12-04 DIAGNOSIS — E785 Hyperlipidemia, unspecified: Secondary | ICD-10-CM | POA: Diagnosis not present

## 2016-12-04 DIAGNOSIS — I10 Essential (primary) hypertension: Secondary | ICD-10-CM | POA: Insufficient documentation

## 2016-12-04 DIAGNOSIS — R229 Localized swelling, mass and lump, unspecified: Secondary | ICD-10-CM | POA: Diagnosis not present

## 2016-12-04 DIAGNOSIS — R221 Localized swelling, mass and lump, neck: Secondary | ICD-10-CM | POA: Insufficient documentation

## 2016-12-04 NOTE — Progress Notes (Signed)
Subjective:    Patient ID: Tammy [name], female   DOB: 66 y.o.   MRN: [id number]   HPI patient presents with painful lesion underneath the right foot stating that it's been present for a fairly long period of time    Review of Systems  All other systems reviewed and are negative.       Objective:  Physical Exam  Cardiovascular: Intact distal pulses.   Musculoskeletal: Normal range of motion.  Neurological: She is alert.  Skin: Skin is warm.  Nursing note and vitals reviewed.  neurovascular status intact muscle strength adequate range of motion within normal limits with patient noted to have painful second metatarsal right that's difficult to walk with and is been present for a fairly long period of time. Patient's found have good digital perfusion well oriented 3     Assessment:    Plantar keratotic lesion right second metatarsal that's painful when palpated and makes shoe gear difficult     Plan:    H&P condition reviewed and debridement of lesion accomplished with no iatrogenic bleeding noted. I applied medication and padding and patient will be seen back when symptomatic

## 2016-12-06 DIAGNOSIS — Z87891 Personal history of nicotine dependence: Secondary | ICD-10-CM | POA: Diagnosis not present

## 2016-12-06 DIAGNOSIS — H93292 Other abnormal auditory perceptions, left ear: Secondary | ICD-10-CM | POA: Diagnosis not present

## 2016-12-06 DIAGNOSIS — H6123 Impacted cerumen, bilateral: Secondary | ICD-10-CM | POA: Diagnosis not present

## 2016-12-25 ENCOUNTER — Other Ambulatory Visit: Payer: Self-pay | Admitting: Internal Medicine

## 2016-12-25 DIAGNOSIS — F17201 Nicotine dependence, unspecified, in remission: Secondary | ICD-10-CM

## 2016-12-29 DIAGNOSIS — L820 Inflamed seborrheic keratosis: Secondary | ICD-10-CM | POA: Diagnosis not present

## 2016-12-29 DIAGNOSIS — L2089 Other atopic dermatitis: Secondary | ICD-10-CM | POA: Diagnosis not present

## 2016-12-29 DIAGNOSIS — L570 Actinic keratosis: Secondary | ICD-10-CM | POA: Diagnosis not present

## 2016-12-29 DIAGNOSIS — L821 Other seborrheic keratosis: Secondary | ICD-10-CM | POA: Diagnosis not present

## 2017-01-12 ENCOUNTER — Ambulatory Visit
Admission: RE | Admit: 2017-01-12 | Discharge: 2017-01-12 | Disposition: A | Discharge status: Home / Self Care | Payer: Medicare Other | Source: Ambulatory Visit | Attending: Internal Medicine | Admitting: Internal Medicine

## 2017-01-12 DIAGNOSIS — Z87891 Personal history of nicotine dependence: Secondary | ICD-10-CM | POA: Diagnosis not present

## 2017-01-12 DIAGNOSIS — F17201 Nicotine dependence, unspecified, in remission: Secondary | ICD-10-CM

## 2017-01-23 DIAGNOSIS — Z0110 Encounter for examination of ears and hearing without abnormal findings: Secondary | ICD-10-CM | POA: Diagnosis not present

## 2017-01-23 DIAGNOSIS — H938X2 Other specified disorders of left ear: Secondary | ICD-10-CM | POA: Diagnosis not present

## 2017-01-23 DIAGNOSIS — H6983 Other specified disorders of Eustachian tube, bilateral: Secondary | ICD-10-CM | POA: Diagnosis not present

## 2017-01-23 DIAGNOSIS — H9193 Unspecified hearing loss, bilateral: Secondary | ICD-10-CM | POA: Diagnosis not present

## 2017-02-07 ENCOUNTER — Encounter: Payer: Self-pay | Admitting: Podiatry

## 2017-02-07 ENCOUNTER — Ambulatory Visit (INDEPENDENT_AMBULATORY_CARE_PROVIDER_SITE_OTHER): Payer: Medicare Other | Admitting: Podiatry

## 2017-02-07 DIAGNOSIS — M779 Enthesopathy, unspecified: Secondary | ICD-10-CM

## 2017-02-07 DIAGNOSIS — L84 Corns and callosities: Secondary | ICD-10-CM | POA: Diagnosis not present

## 2017-02-07 MED ORDER — TRIAMCINOLONE ACETONIDE 10 MG/ML IJ SUSP
10.0000 mg | Freq: Once | INTRAMUSCULAR | Status: AC
  Administered 2017-02-07: 10 mg

## 2017-02-07 NOTE — Progress Notes (Signed)
Subjective:    Patient ID: Tammy [name], female   DOB: 67 y.o.   MRN: [id number]   HPI patient presents with lesion subsecond metatarsal right and quite a bit of inflammation and pain around the lateral side of the right foot around the peroneal tendon with change in gait secondary to the plantar pain right    ROS      Objective:  Physical Exam tendinitis right with inflammation with keratotic lesion noted right     Assessment:    Careful sheath injection administered right 3 mg Kenalog 5 mg Xylocaine and debrided lesion right with no iatrogenic bleeding     Plan:    Tendinitis along with keratotic lesion

## 2017-02-27 DIAGNOSIS — E784 Other hyperlipidemia: Secondary | ICD-10-CM | POA: Diagnosis not present

## 2017-02-27 DIAGNOSIS — Z1159 Encounter for screening for other viral diseases: Secondary | ICD-10-CM | POA: Diagnosis not present

## 2017-02-27 DIAGNOSIS — I2510 Atherosclerotic heart disease of native coronary artery without angina pectoris: Secondary | ICD-10-CM | POA: Diagnosis not present

## 2017-02-27 DIAGNOSIS — E1149 Type 2 diabetes mellitus with other diabetic neurological complication: Secondary | ICD-10-CM | POA: Diagnosis not present

## 2017-02-27 DIAGNOSIS — E1129 Type 2 diabetes mellitus with other diabetic kidney complication: Secondary | ICD-10-CM | POA: Diagnosis not present

## 2017-02-27 DIAGNOSIS — Z6841 Body Mass Index (BMI) 40.0 and over, adult: Secondary | ICD-10-CM | POA: Diagnosis not present

## 2017-02-27 DIAGNOSIS — I10 Essential (primary) hypertension: Secondary | ICD-10-CM | POA: Diagnosis not present

## 2017-03-08 DIAGNOSIS — Z961 Presence of intraocular lens: Secondary | ICD-10-CM | POA: Diagnosis not present

## 2017-03-08 DIAGNOSIS — E119 Type 2 diabetes mellitus without complications: Secondary | ICD-10-CM | POA: Diagnosis not present

## 2017-04-04 ENCOUNTER — Ambulatory Visit: Payer: Medicare Other | Admitting: Podiatry

## 2017-04-05 ENCOUNTER — Encounter: Payer: Self-pay | Admitting: Podiatry

## 2017-04-05 ENCOUNTER — Ambulatory Visit (INDEPENDENT_AMBULATORY_CARE_PROVIDER_SITE_OTHER): Payer: Medicare Other | Admitting: Podiatry

## 2017-04-05 DIAGNOSIS — M216X9 Other acquired deformities of unspecified foot: Secondary | ICD-10-CM

## 2017-04-05 DIAGNOSIS — L84 Corns and callosities: Secondary | ICD-10-CM | POA: Diagnosis not present

## 2017-04-05 DIAGNOSIS — I2584 Coronary atherosclerosis due to calcified coronary lesion: Secondary | ICD-10-CM

## 2017-04-05 DIAGNOSIS — I2510 Atherosclerotic heart disease of native coronary artery without angina pectoris: Secondary | ICD-10-CM

## 2017-04-05 NOTE — Progress Notes (Signed)
Subjective:    Patient ID: Tammy [name], female   DOB: 66 y.o.   MRN: [id number]   HPI patient presents stating she has this chronic callus and the right foot which is coming back quickly and she feels like she needs something to take pressure off of it    ROS      Objective:  Physical Exam neurovascular status intact muscle strength adequate with keratotic lesion that's painful subsecond metatarsal right that's chronic in nature with moderate elevation of the digit also noted     Assessment:    Digital deformity with plantarflexed metatarsal and plantar keratotic lesion second right     Plan:    H&P condition reviewed debridement accomplished and I went ahead today and I have recommended long-term orthotics and I scheduled with Liliane Channel for a softer type orthotic with an disperse and pad around the second metatarsal right and have recommended p cell material

## 2017-04-10 ENCOUNTER — Telehealth: Payer: Self-pay | Admitting: Podiatry

## 2017-04-10 NOTE — Telephone Encounter (Signed)
Called pt to get her scheduled to see Liliane Channel for orthotics and pt said she wants to wait a while and will call to make the appt. Pt is aware insurance will not cover the orthotics.

## 2017-05-23 DIAGNOSIS — M25662 Stiffness of left knee, not elsewhere classified: Secondary | ICD-10-CM | POA: Diagnosis not present

## 2017-05-23 DIAGNOSIS — M170 Bilateral primary osteoarthritis of knee: Secondary | ICD-10-CM | POA: Diagnosis not present

## 2017-05-23 DIAGNOSIS — M25561 Pain in right knee: Secondary | ICD-10-CM | POA: Diagnosis not present

## 2017-05-23 DIAGNOSIS — M25562 Pain in left knee: Secondary | ICD-10-CM | POA: Diagnosis not present

## 2017-05-29 DIAGNOSIS — M170 Bilateral primary osteoarthritis of knee: Secondary | ICD-10-CM | POA: Diagnosis not present

## 2017-05-29 DIAGNOSIS — M25562 Pain in left knee: Secondary | ICD-10-CM | POA: Diagnosis not present

## 2017-05-29 DIAGNOSIS — M1712 Unilateral primary osteoarthritis, left knee: Secondary | ICD-10-CM | POA: Diagnosis not present

## 2017-05-30 DIAGNOSIS — M1711 Unilateral primary osteoarthritis, right knee: Secondary | ICD-10-CM | POA: Diagnosis not present

## 2017-05-30 DIAGNOSIS — M25561 Pain in right knee: Secondary | ICD-10-CM | POA: Diagnosis not present

## 2017-06-05 DIAGNOSIS — M170 Bilateral primary osteoarthritis of knee: Secondary | ICD-10-CM | POA: Diagnosis not present

## 2017-06-05 DIAGNOSIS — M25562 Pain in left knee: Secondary | ICD-10-CM | POA: Diagnosis not present

## 2017-06-05 DIAGNOSIS — M25561 Pain in right knee: Secondary | ICD-10-CM | POA: Diagnosis not present

## 2017-07-31 ENCOUNTER — Other Ambulatory Visit: Payer: Self-pay | Admitting: Internal Medicine

## 2017-07-31 DIAGNOSIS — R911 Solitary pulmonary nodule: Secondary | ICD-10-CM

## 2017-08-07 ENCOUNTER — Ambulatory Visit
Admission: RE | Admit: 2017-08-07 | Discharge: 2017-08-07 | Disposition: A | Discharge status: Home / Self Care | Payer: Medicare Other | Source: Ambulatory Visit | Attending: Internal Medicine | Admitting: Internal Medicine

## 2017-08-07 ENCOUNTER — Other Ambulatory Visit: Payer: Self-pay | Admitting: Internal Medicine

## 2017-08-07 DIAGNOSIS — R911 Solitary pulmonary nodule: Secondary | ICD-10-CM

## 2017-08-07 DIAGNOSIS — J439 Emphysema, unspecified: Secondary | ICD-10-CM | POA: Diagnosis not present

## 2017-09-03 DIAGNOSIS — E7849 Other hyperlipidemia: Secondary | ICD-10-CM | POA: Diagnosis not present

## 2017-09-03 DIAGNOSIS — E1129 Type 2 diabetes mellitus with other diabetic kidney complication: Secondary | ICD-10-CM | POA: Diagnosis not present

## 2017-09-03 DIAGNOSIS — I10 Essential (primary) hypertension: Secondary | ICD-10-CM | POA: Diagnosis not present

## 2017-09-04 DIAGNOSIS — R82998 Other abnormal findings in urine: Secondary | ICD-10-CM | POA: Diagnosis not present

## 2017-09-05 DIAGNOSIS — M25561 Pain in right knee: Secondary | ICD-10-CM | POA: Diagnosis not present

## 2017-09-05 DIAGNOSIS — M25562 Pain in left knee: Secondary | ICD-10-CM | POA: Diagnosis not present

## 2017-09-05 DIAGNOSIS — M170 Bilateral primary osteoarthritis of knee: Secondary | ICD-10-CM | POA: Diagnosis not present

## 2017-09-10 DIAGNOSIS — I6523 Occlusion and stenosis of bilateral carotid arteries: Secondary | ICD-10-CM | POA: Diagnosis not present

## 2017-09-10 DIAGNOSIS — E1129 Type 2 diabetes mellitus with other diabetic kidney complication: Secondary | ICD-10-CM | POA: Diagnosis not present

## 2017-09-10 DIAGNOSIS — Z6841 Body Mass Index (BMI) 40.0 and over, adult: Secondary | ICD-10-CM | POA: Diagnosis not present

## 2017-09-10 DIAGNOSIS — R808 Other proteinuria: Secondary | ICD-10-CM | POA: Diagnosis not present

## 2017-09-10 DIAGNOSIS — G4733 Obstructive sleep apnea (adult) (pediatric): Secondary | ICD-10-CM | POA: Diagnosis not present

## 2017-09-10 DIAGNOSIS — I10 Essential (primary) hypertension: Secondary | ICD-10-CM | POA: Diagnosis not present

## 2017-09-10 DIAGNOSIS — Z1389 Encounter for screening for other disorder: Secondary | ICD-10-CM | POA: Diagnosis not present

## 2017-09-10 DIAGNOSIS — E7849 Other hyperlipidemia: Secondary | ICD-10-CM | POA: Diagnosis not present

## 2017-09-10 DIAGNOSIS — E1149 Type 2 diabetes mellitus with other diabetic neurological complication: Secondary | ICD-10-CM | POA: Diagnosis not present

## 2017-09-10 DIAGNOSIS — Z23 Encounter for immunization: Secondary | ICD-10-CM | POA: Diagnosis not present

## 2017-09-10 DIAGNOSIS — Z0000 Encounter for general adult medical examination without abnormal findings: Secondary | ICD-10-CM | POA: Diagnosis not present

## 2017-09-10 DIAGNOSIS — I2510 Atherosclerotic heart disease of native coronary artery without angina pectoris: Secondary | ICD-10-CM | POA: Diagnosis not present

## 2017-09-13 DIAGNOSIS — Z1212 Encounter for screening for malignant neoplasm of rectum: Secondary | ICD-10-CM | POA: Diagnosis not present

## 2017-09-25 ENCOUNTER — Other Ambulatory Visit: Payer: Self-pay | Admitting: Gastroenterology

## 2017-09-25 DIAGNOSIS — K219 Gastro-esophageal reflux disease without esophagitis: Secondary | ICD-10-CM | POA: Diagnosis not present

## 2017-09-25 DIAGNOSIS — R1310 Dysphagia, unspecified: Secondary | ICD-10-CM | POA: Diagnosis not present

## 2017-09-25 DIAGNOSIS — R194 Change in bowel habit: Secondary | ICD-10-CM | POA: Diagnosis not present

## 2017-09-25 DIAGNOSIS — Z800 Family history of malignant neoplasm of digestive organs: Secondary | ICD-10-CM | POA: Diagnosis not present

## 2017-09-25 DIAGNOSIS — R140 Abdominal distension (gaseous): Secondary | ICD-10-CM | POA: Diagnosis not present

## 2017-09-29 DIAGNOSIS — M1711 Unilateral primary osteoarthritis, right knee: Secondary | ICD-10-CM | POA: Diagnosis not present

## 2017-09-29 DIAGNOSIS — M1712 Unilateral primary osteoarthritis, left knee: Secondary | ICD-10-CM | POA: Diagnosis not present

## 2017-10-01 ENCOUNTER — Ambulatory Visit
Admission: RE | Admit: 2017-10-01 | Discharge: 2017-10-01 | Disposition: A | Discharge status: Home / Self Care | Payer: Medicare Other | Source: Ambulatory Visit | Attending: Gastroenterology | Admitting: Gastroenterology

## 2017-10-01 DIAGNOSIS — K449 Diaphragmatic hernia without obstruction or gangrene: Secondary | ICD-10-CM | POA: Diagnosis not present

## 2017-10-01 DIAGNOSIS — R1310 Dysphagia, unspecified: Secondary | ICD-10-CM | POA: Diagnosis not present

## 2017-10-02 DIAGNOSIS — M1712 Unilateral primary osteoarthritis, left knee: Secondary | ICD-10-CM | POA: Diagnosis not present

## 2017-10-02 DIAGNOSIS — M1711 Unilateral primary osteoarthritis, right knee: Secondary | ICD-10-CM | POA: Diagnosis not present

## 2017-10-10 DIAGNOSIS — M79671 Pain in right foot: Secondary | ICD-10-CM | POA: Diagnosis not present

## 2017-10-10 DIAGNOSIS — M7741 Metatarsalgia, right foot: Secondary | ICD-10-CM | POA: Diagnosis not present

## 2017-10-10 DIAGNOSIS — M2041 Other hammer toe(s) (acquired), right foot: Secondary | ICD-10-CM | POA: Diagnosis not present

## 2017-10-11 DIAGNOSIS — M1712 Unilateral primary osteoarthritis, left knee: Secondary | ICD-10-CM | POA: Diagnosis not present

## 2017-10-11 DIAGNOSIS — M1711 Unilateral primary osteoarthritis, right knee: Secondary | ICD-10-CM | POA: Diagnosis not present

## 2017-10-17 DIAGNOSIS — K449 Diaphragmatic hernia without obstruction or gangrene: Secondary | ICD-10-CM | POA: Diagnosis not present

## 2017-10-17 DIAGNOSIS — K219 Gastro-esophageal reflux disease without esophagitis: Secondary | ICD-10-CM | POA: Diagnosis not present

## 2017-10-17 DIAGNOSIS — R1310 Dysphagia, unspecified: Secondary | ICD-10-CM | POA: Diagnosis not present

## 2017-10-17 DIAGNOSIS — K210 Gastro-esophageal reflux disease with esophagitis: Secondary | ICD-10-CM | POA: Diagnosis not present

## 2017-10-26 ENCOUNTER — Encounter: Payer: Self-pay | Admitting: *Deleted

## 2017-10-26 NOTE — Telephone Encounter (Signed)
This encounter was created in error - please disregard.

## 2017-10-30 ENCOUNTER — Encounter: Payer: Self-pay | Admitting: Family Medicine

## 2017-10-30 DIAGNOSIS — S93491A Sprain of other ligament of right ankle, initial encounter: Secondary | ICD-10-CM | POA: Diagnosis not present

## 2017-10-30 DIAGNOSIS — M7741 Metatarsalgia, right foot: Secondary | ICD-10-CM | POA: Diagnosis not present

## 2017-10-30 DIAGNOSIS — M2041 Other hammer toe(s) (acquired), right foot: Secondary | ICD-10-CM | POA: Diagnosis not present

## 2017-10-30 DIAGNOSIS — M24274 Disorder of ligament, right foot: Secondary | ICD-10-CM | POA: Diagnosis not present

## 2017-10-30 DIAGNOSIS — M216X1 Other acquired deformities of right foot: Secondary | ICD-10-CM | POA: Diagnosis not present

## 2017-10-30 DIAGNOSIS — G8918 Other acute postprocedural pain: Secondary | ICD-10-CM | POA: Diagnosis not present

## 2017-11-12 DIAGNOSIS — Z4789 Encounter for other orthopedic aftercare: Secondary | ICD-10-CM | POA: Diagnosis not present

## 2017-11-12 DIAGNOSIS — M7741 Metatarsalgia, right foot: Secondary | ICD-10-CM | POA: Diagnosis not present

## 2017-11-12 DIAGNOSIS — M2041 Other hammer toe(s) (acquired), right foot: Secondary | ICD-10-CM | POA: Diagnosis not present

## 2017-11-12 DIAGNOSIS — M79671 Pain in right foot: Secondary | ICD-10-CM | POA: Diagnosis not present

## 2017-11-19 DIAGNOSIS — M2041 Other hammer toe(s) (acquired), right foot: Secondary | ICD-10-CM | POA: Diagnosis not present

## 2017-11-19 DIAGNOSIS — Z5189 Encounter for other specified aftercare: Secondary | ICD-10-CM | POA: Diagnosis not present

## 2017-11-22 NOTE — Progress Notes (Signed)
Cardiology Office Note   Date:  11/23/2017   ID:  Tammy [name], DOB November 10, 1950, MRN [id number]  PCP:  [name], MD  Cardiologist:   [name], MD   Chief Complaint  Patient presents with  . Irregular Heart Beat      History of Present Illness: Tammy [name] is a 67 y.o. female who presents for evaluation of coronary calcium. Her level is greater than 99 percentile for age.  I sent her for a POET (Plain Old Exercise Treadmill) in April 2018 that was negative for evidence of ischemia.  She returns for follow up.  Since I last saw her she has had foot surgery.  This is limiting her some degree.  She reports that her heart stopped "20 times" during surgery.  This was done at the [facility].  I do not have any record of this.  I do not see any EKGs or mention of this event.  She has not had any palpitations, presyncope or syncope.  She is had some falls and trauma to the foot that was operated on.  She has not had any chest discomfort, neck or arm discomfort.  She is had no new shortness of breath, PND or orthopnea.  Past Medical History:  Diagnosis Date  . Agatston coronary artery calcium score greater than 400   . Diabetes mellitus ([place])    x 3 years  . DJD (degenerative joint disease)   . Encephalitis   . Fibromyalgia   . HTN (hypertension)   . Hyperlipidemia   . Sleep apnea    No CPAP    Past Surgical History:  Procedure Laterality Date  . APPENDECTOMY    . BREAST CYST EXCISION    . BREAST EXCISIONAL BIOPSY Left 2004  . KNEE ARTHROSCOPY       Current Outpatient Medications  Medication Sig Dispense Refill  . amLODipine-valsartan (EXFORGE) 10-320 MG per tablet Take 1 tablet by mouth daily.    Marland Kitchen aspirin 81 MG tablet Take 81 mg by mouth daily.    . beta carotene w/minerals (OCUVITE) tablet Take 1 tablet by mouth daily.    . DULoxetine (CYMBALTA) 60 MG capsule Take 60 mg by mouth daily.    Marland Kitchen EPINEPHrine 0.3 mg/0.3 mL IJ SOAJ injection  Inject 0.3 mg into the muscle as needed.    . Evolocumab (REPATHA [place]) Inject into the skin every morning. Every other week    . Exenatide (BYDUREON [place]) Inject into the skin once a week.    . fesoterodine (TOVIAZ) 4 MG TB24 tablet Take 4 mg by mouth daily.    . metFORMIN (GLUCOPHAGE) 1000 MG tablet Take 1,000 mg by mouth 2 (two) times daily with a meal.    . phentermine 37.5 MG capsule Take 37.5 mg by mouth every morning.     No current facility-administered medications for this visit.     Allergies:   Betadine [povidone iodine]; Contrast media [iodinated diagnostic agents]; Iodine; Povidone-iodine; Shellfish allergy; and Hydrocodone-acetaminophen    ROS:  Please see the history of present illness.   Otherwise, review of systems are positive for none.   All other systems are reviewed and negative.    PHYSICAL EXAM: VS:  BP (!) 108/58   Pulse 91   Ht 5\' 1"  (1.549 m)   Wt 244 lb 3.2 oz (110.8 kg)   BMI 46.14 kg/m  , BMI Body mass index is 46.14 kg/m.  GENERAL:  Well appearing NECK:  No jugular venous  distention, waveform within normal limits, carotid upstroke brisk and symmetric, no bruits, no thyromegaly LUNGS:  Clear to auscultation bilaterally CHEST:  Unremarkable HEART:  PMI not displaced or sustained,S1 and S2 within normal limits, no S3, no S4, no clicks, no rubs, very brief soft apical systolic murmur nonradiating, no diastolic murmurs ABD:  Flat, positive bowel sounds normal in frequency in pitch, no bruits, no rebound, no guarding, no midline pulsatile mass, no hepatomegaly, no splenomegaly EXT:  2 plus pulses throughout, no edema, no cyanosis no clubbing, still with bandages on her right foot   EKG:  EKG is  ordered today. Sinus rhythm, rate 91, axis within normal limits, intervals within normal limits, no acute ST-T wave changes.  Recent Labs: No results found for requested labs within last 8760 hours.    Wt Readings from Last 3 Encounters:  11/23/17 244 lb 3.2 oz  (110.8 kg)  11/23/16 245 lb (111.1 kg)  04/18/16 252 lb (114.3 kg)      Other studies Reviewed: Additional studies/ records that were reviewed today include: None    ASSESSMENT AND PLAN:  CORONARY CALCIUM:     I will plan for her to come back when her foot is healed to have a POET (Plain Old Exercise Treadmill)   To follow-up on this but she will let me know.      ARRHYTHMIA: I am going to try to get records from the surgical center and I will also apply a 24-hour Holter.  She is not having any symptoms at this point.  DYSLIPIDEMIA:   She is now on Repatha per [name], MD.   I will defer therapy to [name], MD  OBESITY:   I encourage weight loss when she is able to exercise.   NECK SWELLING:  She was found to have some swelling of her neck.  A Doppler did not show thrombus.  No further testing.   Current medicines are reviewed at length with the patient today.  The patient does not have concerns regarding medicines.  The following changes have been made:  None  Labs/ tests ordered today include:    Orders Placed This Encounter  Procedures  . HOLTER MONITOR - 24 HOUR  . EKG 12-Lead     Disposition:   FU with me in 12 months.     Signed, [name], MD  11/23/2017 12:12 PM    [place]

## 2017-11-23 ENCOUNTER — Encounter: Payer: Self-pay | Admitting: Cardiology

## 2017-11-23 ENCOUNTER — Ambulatory Visit (INDEPENDENT_AMBULATORY_CARE_PROVIDER_SITE_OTHER): Payer: Medicare Other | Admitting: Cardiology

## 2017-11-23 VITALS — BP 108/58 | HR 91 | Ht 61.0 in | Wt 244.2 lb

## 2017-11-23 DIAGNOSIS — I499 Cardiac arrhythmia, unspecified: Secondary | ICD-10-CM

## 2017-11-23 DIAGNOSIS — E785 Hyperlipidemia, unspecified: Secondary | ICD-10-CM | POA: Diagnosis not present

## 2017-11-23 DIAGNOSIS — R931 Abnormal findings on diagnostic imaging of heart and coronary circulation: Secondary | ICD-10-CM | POA: Diagnosis not present

## 2017-11-23 NOTE — Patient Instructions (Addendum)
Medication Instructions:  Continue current medication  If you need a refill on your cardiac medications before your next appointment, please call your pharmacy.  Labwork: None ordered   Testing/Procedures: Your physician has recommended that you wear a holter monitor 24 hours. Holter monitors are medical devices that record the heart's electrical activity. Doctors most often use these monitors to diagnose arrhythmias. Arrhythmias are problems with the speed or rhythm of the heartbeat. The monitor is a small, portable device. You can wear one while you do your normal daily activities. This is usually used to diagnose what is causing palpitations/syncope (passing out).   Your physician recommends that you schedule a follow-up appointment in: 12 Months   Thank you for choosing [facility] at [facility]!!

## 2017-12-04 ENCOUNTER — Ambulatory Visit (INDEPENDENT_AMBULATORY_CARE_PROVIDER_SITE_OTHER): Payer: Medicare Other

## 2017-12-04 DIAGNOSIS — I499 Cardiac arrhythmia, unspecified: Secondary | ICD-10-CM

## 2017-12-12 DIAGNOSIS — Z5189 Encounter for other specified aftercare: Secondary | ICD-10-CM | POA: Diagnosis not present

## 2017-12-12 DIAGNOSIS — M2041 Other hammer toe(s) (acquired), right foot: Secondary | ICD-10-CM | POA: Diagnosis not present

## 2017-12-12 DIAGNOSIS — M79671 Pain in right foot: Secondary | ICD-10-CM | POA: Diagnosis not present

## 2017-12-25 DIAGNOSIS — R3129 Other microscopic hematuria: Secondary | ICD-10-CM | POA: Diagnosis not present

## 2017-12-25 DIAGNOSIS — E1129 Type 2 diabetes mellitus with other diabetic kidney complication: Secondary | ICD-10-CM | POA: Diagnosis not present

## 2017-12-25 DIAGNOSIS — R809 Proteinuria, unspecified: Secondary | ICD-10-CM | POA: Diagnosis not present

## 2017-12-31 DIAGNOSIS — L814 Other melanin hyperpigmentation: Secondary | ICD-10-CM | POA: Diagnosis not present

## 2017-12-31 DIAGNOSIS — D2372 Other benign neoplasm of skin of left lower limb, including hip: Secondary | ICD-10-CM | POA: Diagnosis not present

## 2017-12-31 DIAGNOSIS — I788 Other diseases of capillaries: Secondary | ICD-10-CM | POA: Diagnosis not present

## 2017-12-31 DIAGNOSIS — D1722 Benign lipomatous neoplasm of skin and subcutaneous tissue of left arm: Secondary | ICD-10-CM | POA: Diagnosis not present

## 2017-12-31 DIAGNOSIS — D1801 Hemangioma of skin and subcutaneous tissue: Secondary | ICD-10-CM | POA: Diagnosis not present

## 2017-12-31 DIAGNOSIS — L570 Actinic keratosis: Secondary | ICD-10-CM | POA: Diagnosis not present

## 2017-12-31 DIAGNOSIS — D225 Melanocytic nevi of trunk: Secondary | ICD-10-CM | POA: Diagnosis not present

## 2017-12-31 DIAGNOSIS — L308 Other specified dermatitis: Secondary | ICD-10-CM | POA: Diagnosis not present

## 2017-12-31 DIAGNOSIS — L821 Other seborrheic keratosis: Secondary | ICD-10-CM | POA: Diagnosis not present

## 2018-01-08 DIAGNOSIS — R102 Pelvic and perineal pain: Secondary | ICD-10-CM | POA: Diagnosis not present

## 2018-01-09 DIAGNOSIS — Z5189 Encounter for other specified aftercare: Secondary | ICD-10-CM | POA: Diagnosis not present

## 2018-01-09 DIAGNOSIS — M2041 Other hammer toe(s) (acquired), right foot: Secondary | ICD-10-CM | POA: Diagnosis not present

## 2018-01-09 DIAGNOSIS — M79671 Pain in right foot: Secondary | ICD-10-CM | POA: Diagnosis not present

## 2018-02-05 DIAGNOSIS — J439 Emphysema, unspecified: Secondary | ICD-10-CM | POA: Diagnosis not present

## 2018-02-05 DIAGNOSIS — K140 Glossitis: Secondary | ICD-10-CM | POA: Diagnosis not present

## 2018-02-05 DIAGNOSIS — R05 Cough: Secondary | ICD-10-CM | POA: Diagnosis not present

## 2018-02-05 DIAGNOSIS — Z6841 Body Mass Index (BMI) 40.0 and over, adult: Secondary | ICD-10-CM | POA: Diagnosis not present

## 2018-02-20 DIAGNOSIS — M25461 Effusion, right knee: Secondary | ICD-10-CM | POA: Diagnosis not present

## 2018-02-20 DIAGNOSIS — K140 Glossitis: Secondary | ICD-10-CM | POA: Diagnosis not present

## 2018-02-20 DIAGNOSIS — M25561 Pain in right knee: Secondary | ICD-10-CM | POA: Diagnosis not present

## 2018-02-20 DIAGNOSIS — M25562 Pain in left knee: Secondary | ICD-10-CM | POA: Diagnosis not present

## 2018-02-20 DIAGNOSIS — M25462 Effusion, left knee: Secondary | ICD-10-CM | POA: Diagnosis not present

## 2018-02-20 DIAGNOSIS — M1712 Unilateral primary osteoarthritis, left knee: Secondary | ICD-10-CM | POA: Diagnosis not present

## 2018-02-20 DIAGNOSIS — M170 Bilateral primary osteoarthritis of knee: Secondary | ICD-10-CM | POA: Diagnosis not present

## 2018-02-27 DIAGNOSIS — M1711 Unilateral primary osteoarthritis, right knee: Secondary | ICD-10-CM | POA: Diagnosis not present

## 2018-02-27 DIAGNOSIS — M25561 Pain in right knee: Secondary | ICD-10-CM | POA: Diagnosis not present

## 2018-03-06 DIAGNOSIS — M25562 Pain in left knee: Secondary | ICD-10-CM | POA: Diagnosis not present

## 2018-03-06 DIAGNOSIS — M1712 Unilateral primary osteoarthritis, left knee: Secondary | ICD-10-CM | POA: Diagnosis not present

## 2018-03-12 DIAGNOSIS — E1129 Type 2 diabetes mellitus with other diabetic kidney complication: Secondary | ICD-10-CM | POA: Diagnosis not present

## 2018-03-12 DIAGNOSIS — E1149 Type 2 diabetes mellitus with other diabetic neurological complication: Secondary | ICD-10-CM | POA: Diagnosis not present

## 2018-03-12 DIAGNOSIS — Z6841 Body Mass Index (BMI) 40.0 and over, adult: Secondary | ICD-10-CM | POA: Diagnosis not present

## 2018-03-12 DIAGNOSIS — E7849 Other hyperlipidemia: Secondary | ICD-10-CM | POA: Diagnosis not present

## 2018-03-12 DIAGNOSIS — I10 Essential (primary) hypertension: Secondary | ICD-10-CM | POA: Diagnosis not present

## 2018-03-12 DIAGNOSIS — J439 Emphysema, unspecified: Secondary | ICD-10-CM | POA: Diagnosis not present

## 2018-03-12 DIAGNOSIS — R269 Unspecified abnormalities of gait and mobility: Secondary | ICD-10-CM | POA: Diagnosis not present

## 2018-03-13 DIAGNOSIS — M25561 Pain in right knee: Secondary | ICD-10-CM | POA: Diagnosis not present

## 2018-03-13 DIAGNOSIS — M1711 Unilateral primary osteoarthritis, right knee: Secondary | ICD-10-CM | POA: Diagnosis not present

## 2018-03-19 DIAGNOSIS — M1712 Unilateral primary osteoarthritis, left knee: Secondary | ICD-10-CM | POA: Diagnosis not present

## 2018-03-19 DIAGNOSIS — M25562 Pain in left knee: Secondary | ICD-10-CM | POA: Diagnosis not present

## 2018-03-20 ENCOUNTER — Encounter (HOSPITAL_COMMUNITY): Payer: Self-pay | Admitting: Emergency Medicine

## 2018-03-20 ENCOUNTER — Emergency Department (HOSPITAL_COMMUNITY)
Admission: EM | Admit: 2018-03-20 | Discharge: 2018-03-20 | Disposition: A | Discharge status: Home / Self Care | Payer: Medicare Other | Attending: Emergency Medicine | Admitting: Emergency Medicine

## 2018-03-20 ENCOUNTER — Other Ambulatory Visit: Payer: Self-pay

## 2018-03-20 DIAGNOSIS — Z7982 Long term (current) use of aspirin: Secondary | ICD-10-CM | POA: Insufficient documentation

## 2018-03-20 DIAGNOSIS — Z79899 Other long term (current) drug therapy: Secondary | ICD-10-CM | POA: Insufficient documentation

## 2018-03-20 DIAGNOSIS — Z87891 Personal history of nicotine dependence: Secondary | ICD-10-CM | POA: Insufficient documentation

## 2018-03-20 DIAGNOSIS — E119 Type 2 diabetes mellitus without complications: Secondary | ICD-10-CM | POA: Insufficient documentation

## 2018-03-20 DIAGNOSIS — I10 Essential (primary) hypertension: Secondary | ICD-10-CM | POA: Insufficient documentation

## 2018-03-20 DIAGNOSIS — R42 Dizziness and giddiness: Secondary | ICD-10-CM | POA: Diagnosis not present

## 2018-03-20 DIAGNOSIS — R110 Nausea: Secondary | ICD-10-CM | POA: Insufficient documentation

## 2018-03-20 DIAGNOSIS — Z7984 Long term (current) use of oral hypoglycemic drugs: Secondary | ICD-10-CM | POA: Diagnosis not present

## 2018-03-20 LAB — COMPREHENSIVE METABOLIC PANEL
ALT: 17 U/L (ref 0–44)
AST: 16 U/L (ref 15–41)
Albumin: 4 g/dL (ref 3.5–5.0)
Alkaline Phosphatase: 75 U/L (ref 38–126)
Anion gap: 12 (ref 5–15)
BUN: 14 mg/dL (ref 8–23)
CO2: 30 mmol/L (ref 22–32)
Calcium: 10 mg/dL (ref 8.9–10.3)
Chloride: 103 mmol/L (ref 98–111)
Creatinine, Ser: 0.7 mg/dL (ref 0.44–1.00)
GFR calc Af Amer: >60 mL/min (ref >60)
GFR calc non Af Amer: >60 mL/min (ref >60)
Glucose, Bld: 100 mg/dL — ABNORMAL HIGH (ref 70–99)
Potassium: 3.9 mmol/L (ref 3.5–5.1)
Sodium: 145 mmol/L (ref 135–145)
Total Bilirubin: 1.2 mg/dL (ref 0.3–1.2)
Total Protein: 7.6 g/dL (ref 6.5–8.1)

## 2018-03-20 LAB — LIPASE, BLOOD: Lipase: 34 U/L (ref 11–51)

## 2018-03-20 LAB — CBC
HCT: 43.5 % (ref 36.0–46.0)
Hemoglobin: 14.5 g/dL (ref 12.0–15.0)
MCH: 29.7 pg (ref 26.0–34.0)
MCHC: 33.3 g/dL (ref 30.0–36.0)
MCV: 89 fL (ref 78.0–100.0)
Platelets: 295 10*3/uL (ref 150–400)
RBC: 4.89 MIL/uL (ref 3.87–5.11)
RDW: 14.2 % (ref 11.5–15.5)
WBC: 12.3 10*3/uL — ABNORMAL HIGH (ref 4.0–10.5)

## 2018-03-20 MED ORDER — ONDANSETRON 4 MG PO TBDP
4.0000 mg | ORAL_TABLET | Freq: Once | ORAL | Status: AC | PRN
  Administered 2018-03-20: 4 mg via ORAL
  Filled 2018-03-20: qty 1

## 2018-03-20 MED ORDER — ONDANSETRON 4 MG PO TBDP: 4.0000 mg | ORAL_TABLET | Freq: Three times a day (TID) | ORAL | 0 refills | Status: DC | PRN

## 2018-03-20 NOTE — ED Notes (Signed)
Pt made aware urine specimen is needed.  

## 2018-03-20 NOTE — ED Triage Notes (Signed)
Pt reports that she got hot, dizzy, and felt nauseated but only had dry heaves. Pt checked blood sugar and was 72.  Denies any new problems with urination or bowels.

## 2018-03-21 DIAGNOSIS — M1711 Unilateral primary osteoarthritis, right knee: Secondary | ICD-10-CM | POA: Diagnosis not present

## 2018-03-21 DIAGNOSIS — M25561 Pain in right knee: Secondary | ICD-10-CM | POA: Diagnosis not present

## 2018-03-21 LAB — CBG MONITORING, ED: Glucose-Capillary: 86 mg/dL (ref 70–99)

## 2018-03-21 NOTE — ED Provider Notes (Signed)
[facility] DEPT Provider Note   CSN: [id number] Arrival date & time: 03/20/18  1700     History   Chief Complaint Chief Complaint  Patient presents with  . Nausea  . Dizziness    HPI Tammy [name] is a 67 y.o. female.  HPI Patient presents with nausea and dizziness.  Began while she was at home.  States she had been outside doing some work and then cleaning inside.  States she was using a new Physiological scientist.  Then felt lightheaded.  Reportedly looks somewhat pale.  Had a weight around 4-1/2 hours before being seen by me.  States she feels much better now.  Given Zofran.  No abdominal pain.  No chest pain.  No fevers or chills.  States she did feel little lightheaded but that is resolved.  No sick contacts. Past Medical History:  Diagnosis Date  . Agatston coronary artery calcium score greater than 400   . Diabetes mellitus ([place])    x 3 years  . DJD (degenerative joint disease)   . Encephalitis   . Fibromyalgia   . HTN (hypertension)   . Hyperlipidemia   . Sleep apnea    No CPAP    Patient Active Problem List   Diagnosis Date Noted  . Hyperlipidemia 05/20/2015  . Knee pain 06/06/2012    Past Surgical History:  Procedure Laterality Date  . APPENDECTOMY    . BREAST CYST EXCISION    . BREAST EXCISIONAL BIOPSY Left 2004  . KNEE ARTHROSCOPY       OB History   None      Home Medications    Prior to Admission medications   Medication Sig Start Date End Date Taking? Authorizing Provider  amLODipine-valsartan (EXFORGE) 10-320 MG per tablet Take 1 tablet by mouth daily.    [provider]  aspirin 81 MG tablet Take 81 mg by mouth daily.    [provider]  beta carotene w/minerals (OCUVITE) tablet Take 1 tablet by mouth daily.    [provider]  DULoxetine (CYMBALTA) 60 MG capsule Take 60 mg by mouth daily.    [provider]  EPINEPHrine 0.3 mg/0.3 mL IJ SOAJ injection Inject 0.3 mg into the  muscle as needed.    [provider]  Evolocumab (REPATHA [place]) Inject into the skin every morning. Every other week    [provider]  Exenatide (BYDUREON [place]) Inject into the skin once a week.    [provider]  fesoterodine (TOVIAZ) 4 MG TB24 tablet Take 4 mg by mouth daily.    [provider]  metFORMIN (GLUCOPHAGE) 1000 MG tablet Take 1,000 mg by mouth 2 (two) times daily with a meal.    [provider]  ondansetron (ZOFRAN-ODT) 4 MG disintegrating tablet Take 1 tablet (4 mg total) by mouth every 8 (eight) hours as needed for nausea or vomiting. 03/20/18   Davonna Belling, MD  phentermine 37.5 MG capsule Take 37.5 mg by mouth every morning.    [provider]    Family History Family History  Problem Relation Age of Onset  . CAD Mother 27  . CAD Brother 33  . Breast cancer Cousin     Social History Social History   Tobacco Use  . Smoking status: Former Smoker    Packs/day: 1.00    Years: 30.00    Pack years: 30.00    Types: Cigarettes    Last attempt to quit: 11/01/2008    Years since  quitting: 9.3  . Smokeless tobacco: Never Used  Substance Use Topics  . Alcohol use: No    Alcohol/week: 0.0 standard drinks  . Drug use: No     Allergies   Betadine [povidone iodine]; Contrast media [iodinated diagnostic agents]; Iodine; Povidone-iodine; Shellfish allergy; and Hydrocodone-acetaminophen   Review of Systems Review of Systems  Constitutional: Negative for appetite change.  HENT: Negative for congestion.   Respiratory: Negative for shortness of breath.   Cardiovascular: Negative for chest pain.  Gastrointestinal: Positive for nausea.  Genitourinary: Negative for frequency.  Musculoskeletal: Negative for gait problem.  Skin: Negative for rash.  Neurological: Positive for light-headedness.  Hematological: Negative for adenopathy.  Psychiatric/Behavioral: Negative for confusion.     Physical Exam Updated Vital  Signs BP 124/69 (BP Location: Left Arm)   Pulse 90   Temp 98.6 F (37 C) (Oral)   Resp 19   Ht 5' (1.524 m)   Wt 113.4 kg   SpO2 96%   BMI 48.82 kg/m   Physical Exam  Constitutional: She appears well-developed.  HENT:  Head: Atraumatic.  Neck: Neck supple.  Cardiovascular: Normal rate.  Pulmonary/Chest: Effort normal.  Abdominal: There is no tenderness.  Musculoskeletal: She exhibits no tenderness.  Neurological: She is alert.  Skin: Skin is warm. Capillary refill takes less than 2 seconds.     ED Treatments / Results  Labs (all labs ordered are listed, but only abnormal results are displayed) Labs Reviewed  COMPREHENSIVE METABOLIC PANEL - Abnormal; Notable for the following components:      Result Value   Glucose, Bld 100 (*)    All other components within normal limits  CBC - Abnormal; Notable for the following components:   WBC 12.3 (*)    All other components within normal limits  LIPASE, BLOOD  URINALYSIS, ROUTINE W REFLEX MICROSCOPIC    EKG None  Radiology No results found.  Procedures Procedures (including critical care time)  Medications Ordered in ED Medications  ondansetron (ZOFRAN-ODT) disintegrating tablet 4 mg (4 mg Oral Given 03/20/18 1709)     Initial Impression / Assessment and Plan / ED Course  I have reviewed the triage vital signs and the nursing notes.  Pertinent labs & imaging results that were available during my care of the patient were reviewed by me and considered in my medical decision making (see chart for details).     Patient with nausea.  Resolved.  Feels much better.  Lab work reassuring.  Will discharge home.  Doubt cardiac cause.  Nonfocal exam.  Final Clinical Impressions(s) / ED Diagnoses   Final diagnoses:  Nausea    ED Discharge Orders         Ordered    ondansetron (ZOFRAN-ODT) 4 MG disintegrating tablet  Every 8 hours PRN     03/20/18 2135           Davonna Belling, MD 03/21/18 [phone]

## 2018-03-28 DIAGNOSIS — E119 Type 2 diabetes mellitus without complications: Secondary | ICD-10-CM | POA: Diagnosis not present

## 2018-03-28 DIAGNOSIS — Z7984 Long term (current) use of oral hypoglycemic drugs: Secondary | ICD-10-CM | POA: Diagnosis not present

## 2018-03-28 DIAGNOSIS — Z961 Presence of intraocular lens: Secondary | ICD-10-CM | POA: Diagnosis not present

## 2018-05-13 ENCOUNTER — Other Ambulatory Visit: Payer: Self-pay | Admitting: Internal Medicine

## 2018-05-13 DIAGNOSIS — Z1231 Encounter for screening mammogram for malignant neoplasm of breast: Secondary | ICD-10-CM

## 2018-06-12 DIAGNOSIS — L308 Other specified dermatitis: Secondary | ICD-10-CM | POA: Diagnosis not present

## 2018-06-24 ENCOUNTER — Ambulatory Visit
Admission: RE | Admit: 2018-06-24 | Discharge: 2018-06-24 | Disposition: A | Discharge status: Home / Self Care | Payer: Medicare Other | Source: Ambulatory Visit | Attending: Internal Medicine | Admitting: Internal Medicine

## 2018-06-24 DIAGNOSIS — Z1231 Encounter for screening mammogram for malignant neoplasm of breast: Secondary | ICD-10-CM

## 2018-06-25 DIAGNOSIS — M25561 Pain in right knee: Secondary | ICD-10-CM | POA: Diagnosis not present

## 2018-06-25 DIAGNOSIS — M170 Bilateral primary osteoarthritis of knee: Secondary | ICD-10-CM | POA: Diagnosis not present

## 2018-06-25 DIAGNOSIS — M25562 Pain in left knee: Secondary | ICD-10-CM | POA: Diagnosis not present

## 2018-07-02 DIAGNOSIS — M25561 Pain in right knee: Secondary | ICD-10-CM | POA: Diagnosis not present

## 2018-07-02 DIAGNOSIS — M1711 Unilateral primary osteoarthritis, right knee: Secondary | ICD-10-CM | POA: Diagnosis not present

## 2018-07-04 DIAGNOSIS — M1712 Unilateral primary osteoarthritis, left knee: Secondary | ICD-10-CM | POA: Diagnosis not present

## 2018-07-04 DIAGNOSIS — M25562 Pain in left knee: Secondary | ICD-10-CM | POA: Diagnosis not present

## 2018-07-11 DIAGNOSIS — M1711 Unilateral primary osteoarthritis, right knee: Secondary | ICD-10-CM | POA: Diagnosis not present

## 2018-07-11 DIAGNOSIS — M25561 Pain in right knee: Secondary | ICD-10-CM | POA: Diagnosis not present

## 2018-07-15 DIAGNOSIS — M25561 Pain in right knee: Secondary | ICD-10-CM | POA: Diagnosis not present

## 2018-07-15 DIAGNOSIS — M1711 Unilateral primary osteoarthritis, right knee: Secondary | ICD-10-CM | POA: Diagnosis not present

## 2018-07-22 DIAGNOSIS — M25561 Pain in right knee: Secondary | ICD-10-CM | POA: Diagnosis not present

## 2018-07-22 DIAGNOSIS — M1711 Unilateral primary osteoarthritis, right knee: Secondary | ICD-10-CM | POA: Diagnosis not present

## 2018-07-24 DIAGNOSIS — M25562 Pain in left knee: Secondary | ICD-10-CM | POA: Diagnosis not present

## 2018-07-24 DIAGNOSIS — M1712 Unilateral primary osteoarthritis, left knee: Secondary | ICD-10-CM | POA: Diagnosis not present

## 2018-07-30 DIAGNOSIS — M1711 Unilateral primary osteoarthritis, right knee: Secondary | ICD-10-CM | POA: Diagnosis not present

## 2018-07-30 DIAGNOSIS — M25561 Pain in right knee: Secondary | ICD-10-CM | POA: Diagnosis not present

## 2018-08-06 DIAGNOSIS — M1712 Unilateral primary osteoarthritis, left knee: Secondary | ICD-10-CM | POA: Diagnosis not present

## 2018-08-06 DIAGNOSIS — M25562 Pain in left knee: Secondary | ICD-10-CM | POA: Diagnosis not present

## 2018-08-12 DIAGNOSIS — M25561 Pain in right knee: Secondary | ICD-10-CM | POA: Diagnosis not present

## 2018-08-12 DIAGNOSIS — M1711 Unilateral primary osteoarthritis, right knee: Secondary | ICD-10-CM | POA: Diagnosis not present

## 2018-08-19 DIAGNOSIS — M1712 Unilateral primary osteoarthritis, left knee: Secondary | ICD-10-CM | POA: Diagnosis not present

## 2018-08-19 DIAGNOSIS — M25562 Pain in left knee: Secondary | ICD-10-CM | POA: Diagnosis not present

## 2018-09-05 DIAGNOSIS — I10 Essential (primary) hypertension: Secondary | ICD-10-CM | POA: Diagnosis not present

## 2018-09-05 DIAGNOSIS — E7849 Other hyperlipidemia: Secondary | ICD-10-CM | POA: Diagnosis not present

## 2018-09-05 DIAGNOSIS — R82998 Other abnormal findings in urine: Secondary | ICD-10-CM | POA: Diagnosis not present

## 2018-09-05 DIAGNOSIS — E1149 Type 2 diabetes mellitus with other diabetic neurological complication: Secondary | ICD-10-CM | POA: Diagnosis not present

## 2018-09-10 DIAGNOSIS — Z1212 Encounter for screening for malignant neoplasm of rectum: Secondary | ICD-10-CM | POA: Diagnosis not present

## 2018-09-12 DIAGNOSIS — I2510 Atherosclerotic heart disease of native coronary artery without angina pectoris: Secondary | ICD-10-CM | POA: Diagnosis not present

## 2018-09-12 DIAGNOSIS — E7849 Other hyperlipidemia: Secondary | ICD-10-CM | POA: Diagnosis not present

## 2018-09-12 DIAGNOSIS — G4733 Obstructive sleep apnea (adult) (pediatric): Secondary | ICD-10-CM | POA: Diagnosis not present

## 2018-09-12 DIAGNOSIS — I10 Essential (primary) hypertension: Secondary | ICD-10-CM | POA: Diagnosis not present

## 2018-09-12 DIAGNOSIS — I6523 Occlusion and stenosis of bilateral carotid arteries: Secondary | ICD-10-CM | POA: Diagnosis not present

## 2018-09-12 DIAGNOSIS — M791 Myalgia, unspecified site: Secondary | ICD-10-CM | POA: Diagnosis not present

## 2018-09-12 DIAGNOSIS — Z6841 Body Mass Index (BMI) 40.0 and over, adult: Secondary | ICD-10-CM | POA: Diagnosis not present

## 2018-09-12 DIAGNOSIS — E1129 Type 2 diabetes mellitus with other diabetic kidney complication: Secondary | ICD-10-CM | POA: Diagnosis not present

## 2018-09-12 DIAGNOSIS — Z0000 Encounter for general adult medical examination without abnormal findings: Secondary | ICD-10-CM | POA: Diagnosis not present

## 2018-09-12 DIAGNOSIS — J439 Emphysema, unspecified: Secondary | ICD-10-CM | POA: Diagnosis not present

## 2018-09-12 DIAGNOSIS — E1149 Type 2 diabetes mellitus with other diabetic neurological complication: Secondary | ICD-10-CM | POA: Diagnosis not present

## 2018-09-12 DIAGNOSIS — G608 Other hereditary and idiopathic neuropathies: Secondary | ICD-10-CM | POA: Diagnosis not present

## 2018-09-12 DIAGNOSIS — Z23 Encounter for immunization: Secondary | ICD-10-CM | POA: Diagnosis not present

## 2018-09-16 ENCOUNTER — Other Ambulatory Visit: Payer: Self-pay | Admitting: Internal Medicine

## 2018-09-16 DIAGNOSIS — F17201 Nicotine dependence, unspecified, in remission: Secondary | ICD-10-CM

## 2018-09-24 ENCOUNTER — Ambulatory Visit
Admission: RE | Admit: 2018-09-24 | Discharge: 2018-09-24 | Disposition: A | Discharge status: Home / Self Care | Payer: Medicare Other | Source: Ambulatory Visit | Attending: Internal Medicine | Admitting: Internal Medicine

## 2018-09-24 DIAGNOSIS — Z87891 Personal history of nicotine dependence: Secondary | ICD-10-CM | POA: Diagnosis not present

## 2018-09-24 DIAGNOSIS — F17201 Nicotine dependence, unspecified, in remission: Secondary | ICD-10-CM

## 2018-11-13 DIAGNOSIS — L820 Inflamed seborrheic keratosis: Secondary | ICD-10-CM | POA: Diagnosis not present

## 2018-11-26 ENCOUNTER — Telehealth: Payer: Self-pay | Admitting: Cardiology

## 2018-11-26 NOTE — Telephone Encounter (Signed)
Home phone/ my chart/ consent/ pre reg completed °

## 2018-11-26 NOTE — Progress Notes (Signed)
Virtual Visit via Telephone Note   This visit type was conducted due to national recommendations for restrictions regarding the COVID-19 Pandemic (e.g. social distancing) in an effort to limit this patient's exposure and mitigate transmission in our community.  Due to her co-morbid illnesses, this patient is at least at moderate risk for complications without adequate follow up.  This format is felt to be most appropriate for this patient at this time.  The patient did not have access to video technology/had technical difficulties with video requiring transitioning to audio format only (telephone).  All issues noted in this document were discussed and addressed.  No physical exam could be performed with this format.  Please refer to the patient's chart for her  consent to telehealth for [facility].   Date:  11/27/2018   ID:  Hassell Done, DOB 1950/08/25, MRN [id number]  Patient Location: Home Provider Location: Home  PCP:  [name], MD  Cardiologist:  [name], MD  Electrophysiologist:  None   Evaluation Performed:  Follow-Up Visit  Chief Complaint:  Fatigue  History of Present Illness:    Tammy [name] is a 68 y.o. female who presents for follow up of coronary calcium. Her level is greater than 99 percentile for age.  I sent her for a POET (Plain Old Exercise Treadmill) in April 2018 that was negative for evidence of ischemia.  She returns for follow up.   Since I last saw her she has done okay.  She still has some palpitations.  She has no presyncope or syncope.  She is not describing chest pressure, neck or arm discomfort.  Her biggest problem continues to be fatigue.  She is tired when she wakes up in the morning.  She does not sleep well.  She does do some snoring.  The patient does not have symptoms concerning for COVID-19 infection (fever, chills, cough, or new shortness of breath).    Past Medical History:  Diagnosis Date  . Agatston coronary  artery calcium score greater than 400   . Diabetes mellitus ([place])    x 3 years  . DJD (degenerative joint disease)   . Encephalitis   . Fibromyalgia   . HTN (hypertension)   . Hyperlipidemia   . Sleep apnea    No CPAP   Past Surgical History:  Procedure Laterality Date  . APPENDECTOMY    . BREAST CYST EXCISION    . BREAST EXCISIONAL BIOPSY Left 2004  . KNEE ARTHROSCOPY       Current Meds  Medication Sig  . amLODipine-valsartan (EXFORGE) 10-320 MG per tablet Take 1 tablet by mouth daily.  Marland Kitchen aspirin 81 MG tablet Take 81 mg by mouth daily.  . beta carotene w/minerals (OCUVITE) tablet Take 1 tablet by mouth daily.  . DULoxetine (CYMBALTA) 60 MG capsule Take 60 mg by mouth daily.  Marland Kitchen EPINEPHrine 0.3 mg/0.3 mL IJ SOAJ injection Inject 0.3 mg into the muscle as needed.  . Evolocumab (REPATHA [place]) Inject into the skin every morning. Every other week  . Exenatide (BYDUREON [place]) Inject into the skin once a week.  . fesoterodine (TOVIAZ) 4 MG TB24 tablet Take 4 mg by mouth daily.  . metFORMIN (GLUCOPHAGE) 1000 MG tablet Take 1,000 mg by mouth 2 (two) times daily with a meal.  . ondansetron (ZOFRAN-ODT) 4 MG disintegrating tablet Take 1 tablet (4 mg total) by mouth every 8 (eight) hours as needed for nausea or vomiting.     Allergies:   Betadine [povidone  iodine]; Contrast media [iodinated diagnostic agents]; Iodine; Povidone-iodine; Shellfish allergy; and Hydrocodone-acetaminophen   Social History   Tobacco Use  . Smoking status: Former Smoker    Packs/day: 1.00    Years: 30.00    Pack years: 30.00    Types: Cigarettes    Last attempt to quit: 11/01/2008    Years since quitting: 10.0  . Smokeless tobacco: Never Used  Substance Use Topics  . Alcohol use: No    Alcohol/week: 0.0 standard drinks  . Drug use: No     Family Hx: [name] family history includes Breast cancer in her cousin; CAD (age of onset: 48) in her mother; CAD (age of onset: 13) in her brother.  ROS:    Please see the history of present illness.    As stated in the HPI and negative for all other systems.   Prior CV studies:   The following studies were reviewed today: None  Labs/Other Tests and Data Reviewed:    EKG:  No ECG reviewed.  Recent Labs: 03/20/2018: ALT 17; BUN 14; Creatinine, Ser 0.70; Hemoglobin 14.5; Platelets 295; Potassium 3.9; Sodium 145   Recent Lipid Panel Lab Results  Component Value Date/Time   CHOL 285 (H) 04/23/2015 11:44 AM   TRIG 140 04/23/2015 11:44 AM   HDL 50 04/23/2015 11:44 AM   CHOLHDL 5.7 (H) 04/23/2015 11:44 AM   LDLCALC 207 (H) 04/23/2015 11:44 AM    Wt Readings from Last 3 Encounters:  11/27/18 244 lb (110.7 kg)  09/24/18 249 lb (112.9 kg)  03/20/18 250 lb (113.4 kg)     Objective:    Vital Signs:  BP 122/76   Pulse 86   Ht 5' (1.524 m)   Wt 244 lb (110.7 kg)   BMI 47.65 kg/m    VITAL SIGNS:  reviewed GEN:  no acute distress EYES:  sclerae anicteric, EOMI - Extraocular Movements Intact NEURO:  alert and oriented x 3, no obvious focal deficit PSYCH:  normal affect  ASSESSMENT & PLAN:     CORONARY CALCIUM:     She had a negative POET (Plain Old Exercise Treadmill).  I will follow up with a POET (Plain Old Exercise Treadmill) probably when I see her next year.   ARRHYTHMIA:     She wore a Holter and I saw no arrhythmias.  She has had some palpitations but no change in these.  No change in therapy is planned.    DYSLIPIDEMIA:   She is now on Repatha per [name], MD.    Elsie Ra:  She has snoring and a high Sleepiness score and I will schedule a sleep study.    COVID-19 Education: The signs and symptoms of COVID-19 were discussed with the patient and how to seek care for testing (follow up with PCP or arrange E-visit).  The importance of social distancing was discussed today.  Time:   Today, I have spent 16 minutes with the patient with telehealth technology discussing the above problems.     Medication  Adjustments/Labs and Tests Ordered: Current medicines are reviewed at length with the patient today.  Concerns regarding medicines are outlined above.   Tests Ordered: No orders of the defined types were placed in this encounter.   Medication Changes: No orders of the defined types were placed in this encounter.   Disposition:  Follow up with me in one year  Signed, [name], MD  11/27/2018 9:02 AM    [place]

## 2018-11-27 ENCOUNTER — Encounter: Payer: Self-pay | Admitting: Cardiology

## 2018-11-27 ENCOUNTER — Telehealth (INDEPENDENT_AMBULATORY_CARE_PROVIDER_SITE_OTHER): Payer: Medicare Other | Admitting: Cardiology

## 2018-11-27 VITALS — BP 122/76 | HR 86 | Ht 60.0 in | Wt 244.0 lb

## 2018-11-27 DIAGNOSIS — E785 Hyperlipidemia, unspecified: Secondary | ICD-10-CM

## 2018-11-27 DIAGNOSIS — R0602 Shortness of breath: Secondary | ICD-10-CM | POA: Insufficient documentation

## 2018-11-27 DIAGNOSIS — R400 Somnolence: Secondary | ICD-10-CM | POA: Insufficient documentation

## 2018-11-27 DIAGNOSIS — R5383 Other fatigue: Secondary | ICD-10-CM | POA: Diagnosis not present

## 2018-11-27 DIAGNOSIS — R0683 Snoring: Secondary | ICD-10-CM | POA: Diagnosis not present

## 2018-11-27 DIAGNOSIS — I499 Cardiac arrhythmia, unspecified: Secondary | ICD-10-CM | POA: Diagnosis not present

## 2018-11-27 DIAGNOSIS — Z7189 Other specified counseling: Secondary | ICD-10-CM | POA: Insufficient documentation

## 2018-11-27 DIAGNOSIS — G4710 Hypersomnia, unspecified: Secondary | ICD-10-CM

## 2018-11-27 NOTE — Patient Instructions (Signed)
Medication Instructions:  Continue current medications  If you need a refill on your cardiac medications before your next appointment, please call your pharmacy.  Labwork: None Ordered   Testing/Procedures: Your physician has recommended that you have a sleep study. This test records several body functions during sleep, including: brain activity, eye movement, oxygen and carbon dioxide blood levels, heart rate and rhythm, breathing rate and rhythm, the flow of air through your mouth and nose, snoring, body muscle movements, and chest and belly movement.  Your physician has requested that you have an exercise tolerance test in 1 Year. For further information please visit [email or web address]. Please also follow instruction sheet, as given.   Follow-Up: You will need a follow up appointment in 1 Year.  Please call our office 2 months in advance to schedule this appointment.  You may see [name], MD or one of the following Advanced Practice Providers on your designated Care Team:   Rosaria Ferries, PA-C . Jory Sims, DNP, ANP     At [facility], you and your health needs are our priority.  As part of our continuing mission to provide you with exceptional heart care, we have created designated Provider Care Teams.  These Care Teams include your primary Cardiologist (physician) and Advanced Practice Providers (APPs -  Physician Assistants and Nurse Practitioners) who all work together to provide you with the care you need, when you need it.  Thank you for choosing [facility] at [facility]!!

## 2018-12-18 DIAGNOSIS — M25461 Effusion, right knee: Secondary | ICD-10-CM | POA: Diagnosis not present

## 2018-12-18 DIAGNOSIS — M25562 Pain in left knee: Secondary | ICD-10-CM | POA: Diagnosis not present

## 2018-12-18 DIAGNOSIS — M25561 Pain in right knee: Secondary | ICD-10-CM | POA: Diagnosis not present

## 2018-12-18 DIAGNOSIS — M170 Bilateral primary osteoarthritis of knee: Secondary | ICD-10-CM | POA: Diagnosis not present

## 2018-12-30 ENCOUNTER — Ambulatory Visit (INDEPENDENT_AMBULATORY_CARE_PROVIDER_SITE_OTHER): Payer: Medicare Other | Admitting: Vascular Surgery

## 2018-12-30 ENCOUNTER — Encounter (INDEPENDENT_AMBULATORY_CARE_PROVIDER_SITE_OTHER): Payer: Self-pay | Admitting: Vascular Surgery

## 2018-12-30 ENCOUNTER — Other Ambulatory Visit: Payer: Self-pay

## 2018-12-30 VITALS — BP 112/70 | HR 92 | Resp 16 | Ht 61.0 in | Wt 248.8 lb

## 2018-12-30 DIAGNOSIS — M1990 Unspecified osteoarthritis, unspecified site: Secondary | ICD-10-CM | POA: Insufficient documentation

## 2018-12-30 DIAGNOSIS — M8949 Other hypertrophic osteoarthropathy, multiple sites: Secondary | ICD-10-CM

## 2018-12-30 DIAGNOSIS — M159 Polyosteoarthritis, unspecified: Secondary | ICD-10-CM

## 2018-12-30 DIAGNOSIS — I8312 Varicose veins of left lower extremity with inflammation: Secondary | ICD-10-CM | POA: Diagnosis not present

## 2018-12-30 DIAGNOSIS — M150 Primary generalized (osteo)arthritis: Secondary | ICD-10-CM | POA: Diagnosis not present

## 2018-12-30 DIAGNOSIS — E782 Mixed hyperlipidemia: Secondary | ICD-10-CM

## 2018-12-30 DIAGNOSIS — I8311 Varicose veins of right lower extremity with inflammation: Secondary | ICD-10-CM | POA: Diagnosis not present

## 2018-12-30 DIAGNOSIS — I872 Venous insufficiency (chronic) (peripheral): Secondary | ICD-10-CM | POA: Diagnosis not present

## 2018-12-30 NOTE — Progress Notes (Signed)
MRN : [id number]  Tammy [name] is a 68 y.o. (03/07/1951) female who presents with chief complaint of  Chief Complaint  Patient presents with  . New Patient (Initial Visit)    ref Brigitte Pulse for varicose veins  .  History of Present Illness:   The patient is seen for evaluation of symptomatic varicose veins. . The patient also notes an aching and throbbing pain over the varicosities, particularly with prolonged dependent positions. The symptoms are significantly improved with elevation.    She describes her symptoms as "when I have been   There is no history of DVT, PE or superficial thrombophlebitis. There is no history of ulceration or hemorrhage. The patient denies a significant family history of varicose veins.  The patient has not worn graduated compression in the past. At the present time the patient has not been using over-the-counter analgesics. There is no history of prior surgical intervention or sclerotherapy.    Current Meds  Medication Sig  . amLODipine-valsartan (EXFORGE) 10-320 MG per tablet Take 1 tablet by mouth daily.  Marland Kitchen aspirin 81 MG tablet Take 81 mg by mouth daily.  . beta carotene w/minerals (OCUVITE) tablet Take 1 tablet by mouth daily.  . DULoxetine (CYMBALTA) 60 MG capsule Take 60 mg by mouth daily.  Marland Kitchen EPINEPHrine 0.3 mg/0.3 mL IJ SOAJ injection Inject 0.3 mg into the muscle as needed.  . Evolocumab (REPATHA [place]) Inject into the skin every morning. Every other week  . Exenatide (BYDUREON [place]) Inject into the skin once a week.  . fesoterodine (TOVIAZ) 4 MG TB24 tablet Take 4 mg by mouth daily.  . metFORMIN (GLUCOPHAGE) 1000 MG tablet Take 1,000 mg by mouth 2 (two) times daily with a meal.  . ondansetron (ZOFRAN-ODT) 4 MG disintegrating tablet Take 1 tablet (4 mg total) by mouth every 8 (eight) hours as needed for nausea or vomiting.  . phentermine 37.5 MG capsule Take 37.5 mg by mouth every morning.  . valACYclovir HCl (VALTREX PO) valacyclovir     Past Medical History:  Diagnosis Date  . Agatston coronary artery calcium score greater than 400   . Diabetes mellitus ([place])    x 3 years  . DJD (degenerative joint disease)   . Encephalitis   . Fibromyalgia   . HTN (hypertension)   . Hyperlipidemia   . Sleep apnea    No CPAP    Past Surgical History:  Procedure Laterality Date  . APPENDECTOMY    . BREAST CYST EXCISION    . BREAST EXCISIONAL BIOPSY Left 2004  . KNEE ARTHROSCOPY      Social History Social History   Tobacco Use  . Smoking status: Former Smoker    Packs/day: 1.00    Years: 30.00    Pack years: 30.00    Types: Cigarettes    Quit date: 11/01/2008    Years since quitting: 10.1  . Smokeless tobacco: Never Used  Substance Use Topics  . Alcohol use: No    Alcohol/week: 0.0 standard drinks  . Drug use: No    Family History Family History  Problem Relation Age of Onset  . CAD Mother 72  . CAD Brother 68  . Breast cancer Cousin   No family history of bleeding/clotting disorders, porphyria or autoimmune disease   Allergies  Allergen Reactions  . Betadine [Povidone Iodine] Anaphylaxis  . Contrast Media [Iodinated Diagnostic Agents] Anaphylaxis  . Iodine Anaphylaxis  . Povidone-Iodine Anaphylaxis  . Shellfish Allergy Anaphylaxis  . Hydrocodone-Acetaminophen Nausea Only  REVIEW OF SYSTEMS (Negative unless checked)  Constitutional: [] Weight loss  [] Fever  [] Chills Cardiac: [] Chest pain   [] Chest pressure   [] Palpitations   [] Shortness of breath when laying flat   [] Shortness of breath with exertion. Vascular:  [] Pain in legs with walking   [] Pain in legs at rest  [] History of DVT   [] Phlebitis   [x] Swelling in legs   [x] Varicose veins   [] Non-healing ulcers Pulmonary:   [] Uses home oxygen   [] Productive cough   [] Hemoptysis   [] Wheeze  [] COPD   [] Asthma Neurologic:  [] Dizziness   [] Seizures   [] History of stroke   [] History of TIA  [] Aphasia   [] Vissual changes   [] Weakness or numbness in arm    [] Weakness or numbness in leg Musculoskeletal:   [] Joint swelling   [] Joint pain   [] Low back pain Hematologic:  [] Easy bruising  [] Easy bleeding   [] Hypercoagulable state   [] Anemic Gastrointestinal:  [] Diarrhea   [] Vomiting  [] Gastroesophageal reflux/heartburn   [] Difficulty swallowing. Genitourinary:  [] Chronic kidney disease   [] Difficult urination  [] Frequent urination   [] Blood in urine Skin:  [] Rashes   [] Ulcers  Psychological:  [] History of anxiety   []  History of major depression.  Physical Examination  Vitals:   12/30/18 1141  BP: 112/70  Pulse: 92  Resp: 16  Weight: 248 lb 12.8 oz (112.9 kg)  Height: 5\' 1"  (1.549 m)   Body mass index is 47.01 kg/m. Gen: WD/WN, NAD Head: [place]/AT, No temporalis wasting.  Ear/Nose/Throat: Hearing grossly intact, nares w/o erythema or drainage, poor dentition Eyes: PER, EOMI, sclera nonicteric.  Neck: Supple, no masses.  No bruit or JVD.  Pulmonary:  Good air movement, clear to auscultation bilaterally, no use of accessory muscles.  Cardiac: RRR, normal S1, S2, no Murmurs. Vascular: scattered varicosities present bilaterally.  Mild venous stasis changes to the legs bilaterally.  2+ soft pitting edema Vessel Right Left  Radial Palpable Palpable  PT Palpable Palpable  DP Palpable Palpable  Gastrointestinal: soft, non-distended. No guarding/no peritoneal signs.  Musculoskeletal: M/S 5/5 throughout.  No deformity or atrophy.  Neurologic: CN 2-12 intact. Pain and light touch intact in extremities.  Symmetrical.  Speech is fluent. Motor exam as listed above. Psychiatric: Judgment intact, Mood & affect appropriate for pt's clinical situation. Dermatologic: No rashes or ulcers noted.  No changes consistent with cellulitis. Lymph : No Cervical lymphadenopathy, no lichenification or skin changes of chronic lymphedema.  CBC Lab Results  Component Value Date   WBC 12.3 (H) 03/20/2018   HGB 14.5 03/20/2018   HCT 43.5 03/20/2018   MCV 89.0  03/20/2018   PLT 295 03/20/2018    BMET    Component Value Date/Time   NA 145 03/20/2018 1817   K 3.9 03/20/2018 1817   CL 103 03/20/2018 1817   CO2 30 03/20/2018 1817   GLUCOSE 100 (H) 03/20/2018 1817   BUN 14 03/20/2018 1817   CREATININE 0.70 03/20/2018 1817   CALCIUM 10.0 03/20/2018 1817   GFRNONAA >60 03/20/2018 1817   GFRAA >60 03/20/2018 1817   CrCl cannot be calculated (Patient's most recent lab result is older than the maximum 21 days allowed.).  COAG No results found for: INR, PROTIME  Radiology No results found.   Assessment/Plan 1. Chronic venous insufficiency Recommend:  The patient is complaining of varicose veins.    I have had a long discussion with the patient regarding  varicose veins and why they cause symptoms.  Patient will begin wearing graduated compression stockings  on a daily basis, beginning first thing in the morning and removing them in the evening. The patient is instructed specifically not to sleep in the stockings.    The patient  will also begin using over-the-counter analgesics such as Motrin 600 mg po TID to help control the symptoms as needed.    In addition, behavioral modification including elevation during the day will be initiated, utilizing a recliner was recommended.  The patient is also instructed to continue exercising such as walking 4-5 times per week.  At this time the patient wishes to continue conservative therapy and is not interested in more invasive treatments such as laser ablation and sclerotherapy.  The Patient will follow up PRN if the symptoms worsen.  2. Varicose veins of both lower extremities with inflammation Recommend:  The patient is complaining of varicose veins.    I have had a long discussion with the patient regarding  varicose veins and why they cause symptoms.  Patient will begin wearing graduated compression stockings on a daily basis, beginning first thing in the morning and removing them in the  evening. The patient is instructed specifically not to sleep in the stockings.    The patient  will also begin using over-the-counter analgesics such as Motrin 600 mg po TID to help control the symptoms as needed.    In addition, behavioral modification including elevation during the day will be initiated, utilizing a recliner was recommended.  The patient is also instructed to continue exercising such as walking 4-5 times per week.  At this time the patient wishes to continue conservative therapy and is not interested in more invasive treatments such as laser ablation and sclerotherapy.  The Patient will follow up PRN if the symptoms worsen.  3. Primary osteoarthritis involving multiple joints Continue NSAID medications as already ordered, these medications have been reviewed and there are no changes at this time.  Continued activity and therapy was stressed.   4. Mixed hyperlipidemia Continue statin as ordered and reviewed, no changes at this time    Hortencia Pilar, MD  12/30/2018 11:45 AM

## 2019-01-05 ENCOUNTER — Encounter (INDEPENDENT_AMBULATORY_CARE_PROVIDER_SITE_OTHER): Payer: Self-pay | Admitting: Vascular Surgery

## 2019-01-13 DIAGNOSIS — N181 Chronic kidney disease, stage 1: Secondary | ICD-10-CM | POA: Diagnosis not present

## 2019-01-13 DIAGNOSIS — E1122 Type 2 diabetes mellitus with diabetic chronic kidney disease: Secondary | ICD-10-CM | POA: Diagnosis not present

## 2019-01-13 DIAGNOSIS — R809 Proteinuria, unspecified: Secondary | ICD-10-CM | POA: Diagnosis not present

## 2019-01-13 DIAGNOSIS — R296 Repeated falls: Secondary | ICD-10-CM | POA: Diagnosis not present

## 2019-01-13 DIAGNOSIS — I129 Hypertensive chronic kidney disease with stage 1 through stage 4 chronic kidney disease, or unspecified chronic kidney disease: Secondary | ICD-10-CM | POA: Diagnosis not present

## 2019-03-19 DIAGNOSIS — R300 Dysuria: Secondary | ICD-10-CM | POA: Diagnosis not present

## 2019-03-19 DIAGNOSIS — I10 Essential (primary) hypertension: Secondary | ICD-10-CM | POA: Diagnosis not present

## 2019-03-19 DIAGNOSIS — E785 Hyperlipidemia, unspecified: Secondary | ICD-10-CM | POA: Diagnosis not present

## 2019-03-19 DIAGNOSIS — E1129 Type 2 diabetes mellitus with other diabetic kidney complication: Secondary | ICD-10-CM | POA: Diagnosis not present

## 2019-03-19 DIAGNOSIS — E1149 Type 2 diabetes mellitus with other diabetic neurological complication: Secondary | ICD-10-CM | POA: Diagnosis not present

## 2019-03-19 DIAGNOSIS — J439 Emphysema, unspecified: Secondary | ICD-10-CM | POA: Diagnosis not present

## 2019-03-27 DIAGNOSIS — M25562 Pain in left knee: Secondary | ICD-10-CM | POA: Diagnosis not present

## 2019-03-27 DIAGNOSIS — M170 Bilateral primary osteoarthritis of knee: Secondary | ICD-10-CM | POA: Diagnosis not present

## 2019-03-27 DIAGNOSIS — M25561 Pain in right knee: Secondary | ICD-10-CM | POA: Diagnosis not present

## 2019-03-27 DIAGNOSIS — M1712 Unilateral primary osteoarthritis, left knee: Secondary | ICD-10-CM | POA: Diagnosis not present

## 2019-04-01 DIAGNOSIS — Z7984 Long term (current) use of oral hypoglycemic drugs: Secondary | ICD-10-CM | POA: Diagnosis not present

## 2019-04-01 DIAGNOSIS — E119 Type 2 diabetes mellitus without complications: Secondary | ICD-10-CM | POA: Diagnosis not present

## 2019-04-01 DIAGNOSIS — Z961 Presence of intraocular lens: Secondary | ICD-10-CM | POA: Diagnosis not present

## 2019-04-03 DIAGNOSIS — M25561 Pain in right knee: Secondary | ICD-10-CM | POA: Diagnosis not present

## 2019-04-03 DIAGNOSIS — M1711 Unilateral primary osteoarthritis, right knee: Secondary | ICD-10-CM | POA: Diagnosis not present

## 2019-04-10 DIAGNOSIS — M1712 Unilateral primary osteoarthritis, left knee: Secondary | ICD-10-CM | POA: Diagnosis not present

## 2019-04-10 DIAGNOSIS — M25562 Pain in left knee: Secondary | ICD-10-CM | POA: Diagnosis not present

## 2019-04-16 DIAGNOSIS — M1711 Unilateral primary osteoarthritis, right knee: Secondary | ICD-10-CM | POA: Diagnosis not present

## 2019-04-16 DIAGNOSIS — M25561 Pain in right knee: Secondary | ICD-10-CM | POA: Diagnosis not present

## 2019-04-17 DIAGNOSIS — M1712 Unilateral primary osteoarthritis, left knee: Secondary | ICD-10-CM | POA: Diagnosis not present

## 2019-04-17 DIAGNOSIS — M25562 Pain in left knee: Secondary | ICD-10-CM | POA: Diagnosis not present

## 2019-04-23 DIAGNOSIS — M1711 Unilateral primary osteoarthritis, right knee: Secondary | ICD-10-CM | POA: Diagnosis not present

## 2019-04-23 DIAGNOSIS — M25561 Pain in right knee: Secondary | ICD-10-CM | POA: Diagnosis not present

## 2019-04-24 DIAGNOSIS — M25562 Pain in left knee: Secondary | ICD-10-CM | POA: Diagnosis not present

## 2019-04-24 DIAGNOSIS — M1712 Unilateral primary osteoarthritis, left knee: Secondary | ICD-10-CM | POA: Diagnosis not present

## 2019-04-30 DIAGNOSIS — M1711 Unilateral primary osteoarthritis, right knee: Secondary | ICD-10-CM | POA: Diagnosis not present

## 2019-04-30 DIAGNOSIS — M25561 Pain in right knee: Secondary | ICD-10-CM | POA: Diagnosis not present

## 2019-05-01 DIAGNOSIS — M25562 Pain in left knee: Secondary | ICD-10-CM | POA: Diagnosis not present

## 2019-05-01 DIAGNOSIS — M1712 Unilateral primary osteoarthritis, left knee: Secondary | ICD-10-CM | POA: Diagnosis not present

## 2019-05-08 DIAGNOSIS — M1711 Unilateral primary osteoarthritis, right knee: Secondary | ICD-10-CM | POA: Diagnosis not present

## 2019-05-08 DIAGNOSIS — M25561 Pain in right knee: Secondary | ICD-10-CM | POA: Diagnosis not present

## 2019-05-16 DIAGNOSIS — L814 Other melanin hyperpigmentation: Secondary | ICD-10-CM | POA: Diagnosis not present

## 2019-05-16 DIAGNOSIS — L309 Dermatitis, unspecified: Secondary | ICD-10-CM | POA: Diagnosis not present

## 2019-05-16 DIAGNOSIS — L821 Other seborrheic keratosis: Secondary | ICD-10-CM | POA: Diagnosis not present

## 2019-05-16 DIAGNOSIS — D1801 Hemangioma of skin and subcutaneous tissue: Secondary | ICD-10-CM | POA: Diagnosis not present

## 2019-08-13 DIAGNOSIS — M25461 Effusion, right knee: Secondary | ICD-10-CM | POA: Diagnosis not present

## 2019-08-13 DIAGNOSIS — M1711 Unilateral primary osteoarthritis, right knee: Secondary | ICD-10-CM | POA: Diagnosis not present

## 2019-08-13 DIAGNOSIS — M25561 Pain in right knee: Secondary | ICD-10-CM | POA: Diagnosis not present

## 2019-08-20 DIAGNOSIS — M25561 Pain in right knee: Secondary | ICD-10-CM | POA: Diagnosis not present

## 2019-08-20 DIAGNOSIS — M170 Bilateral primary osteoarthritis of knee: Secondary | ICD-10-CM | POA: Diagnosis not present

## 2019-08-25 ENCOUNTER — Other Ambulatory Visit: Payer: Self-pay | Admitting: Internal Medicine

## 2019-08-25 DIAGNOSIS — Z1231 Encounter for screening mammogram for malignant neoplasm of breast: Secondary | ICD-10-CM

## 2019-09-17 DIAGNOSIS — E1129 Type 2 diabetes mellitus with other diabetic kidney complication: Secondary | ICD-10-CM | POA: Diagnosis not present

## 2019-09-17 DIAGNOSIS — E7849 Other hyperlipidemia: Secondary | ICD-10-CM | POA: Diagnosis not present

## 2019-09-22 DIAGNOSIS — I10 Essential (primary) hypertension: Secondary | ICD-10-CM | POA: Diagnosis not present

## 2019-09-22 DIAGNOSIS — R82998 Other abnormal findings in urine: Secondary | ICD-10-CM | POA: Diagnosis not present

## 2019-09-24 DIAGNOSIS — E785 Hyperlipidemia, unspecified: Secondary | ICD-10-CM | POA: Diagnosis not present

## 2019-09-24 DIAGNOSIS — M79651 Pain in right thigh: Secondary | ICD-10-CM | POA: Diagnosis not present

## 2019-09-24 DIAGNOSIS — Z1331 Encounter for screening for depression: Secondary | ICD-10-CM | POA: Diagnosis not present

## 2019-09-24 DIAGNOSIS — M179 Osteoarthritis of knee, unspecified: Secondary | ICD-10-CM | POA: Diagnosis not present

## 2019-09-24 DIAGNOSIS — E1149 Type 2 diabetes mellitus with other diabetic neurological complication: Secondary | ICD-10-CM | POA: Diagnosis not present

## 2019-09-24 DIAGNOSIS — E1129 Type 2 diabetes mellitus with other diabetic kidney complication: Secondary | ICD-10-CM | POA: Diagnosis not present

## 2019-09-24 DIAGNOSIS — I10 Essential (primary) hypertension: Secondary | ICD-10-CM | POA: Diagnosis not present

## 2019-09-24 DIAGNOSIS — I6523 Occlusion and stenosis of bilateral carotid arteries: Secondary | ICD-10-CM | POA: Diagnosis not present

## 2019-09-24 DIAGNOSIS — I2510 Atherosclerotic heart disease of native coronary artery without angina pectoris: Secondary | ICD-10-CM | POA: Diagnosis not present

## 2019-09-24 DIAGNOSIS — G609 Hereditary and idiopathic neuropathy, unspecified: Secondary | ICD-10-CM | POA: Diagnosis not present

## 2019-09-24 DIAGNOSIS — Z0000 Encounter for general adult medical examination without abnormal findings: Secondary | ICD-10-CM | POA: Diagnosis not present

## 2019-09-24 DIAGNOSIS — J439 Emphysema, unspecified: Secondary | ICD-10-CM | POA: Diagnosis not present

## 2019-09-27 ENCOUNTER — Other Ambulatory Visit: Payer: Self-pay | Admitting: Internal Medicine

## 2019-09-27 DIAGNOSIS — F17201 Nicotine dependence, unspecified, in remission: Secondary | ICD-10-CM

## 2019-09-29 DIAGNOSIS — M79651 Pain in right thigh: Secondary | ICD-10-CM | POA: Diagnosis not present

## 2019-10-03 ENCOUNTER — Ambulatory Visit
Admission: RE | Admit: 2019-10-03 | Discharge: 2019-10-03 | Disposition: A | Discharge status: Home / Self Care | Payer: Medicare Other | Source: Ambulatory Visit | Attending: Internal Medicine | Admitting: Internal Medicine

## 2019-10-03 ENCOUNTER — Other Ambulatory Visit: Payer: Self-pay

## 2019-10-03 DIAGNOSIS — Z1231 Encounter for screening mammogram for malignant neoplasm of breast: Secondary | ICD-10-CM | POA: Diagnosis not present

## 2019-10-03 DIAGNOSIS — Z23 Encounter for immunization: Secondary | ICD-10-CM | POA: Diagnosis not present

## 2019-10-07 DIAGNOSIS — Z1212 Encounter for screening for malignant neoplasm of rectum: Secondary | ICD-10-CM | POA: Diagnosis not present

## 2019-10-10 ENCOUNTER — Inpatient Hospital Stay: Admission: RE | Admit: 2019-10-10 | Payer: Medicare Other | Source: Ambulatory Visit

## 2019-10-22 ENCOUNTER — Ambulatory Visit
Admission: RE | Admit: 2019-10-22 | Discharge: 2019-10-22 | Disposition: A | Discharge status: Home / Self Care | Payer: Medicare Other | Source: Ambulatory Visit | Attending: Internal Medicine | Admitting: Internal Medicine

## 2019-10-22 DIAGNOSIS — F17201 Nicotine dependence, unspecified, in remission: Secondary | ICD-10-CM

## 2019-10-22 DIAGNOSIS — Z87891 Personal history of nicotine dependence: Secondary | ICD-10-CM | POA: Diagnosis not present

## 2019-10-23 DIAGNOSIS — M170 Bilateral primary osteoarthritis of knee: Secondary | ICD-10-CM | POA: Diagnosis not present

## 2019-10-23 DIAGNOSIS — M1711 Unilateral primary osteoarthritis, right knee: Secondary | ICD-10-CM | POA: Diagnosis not present

## 2019-10-23 DIAGNOSIS — M25561 Pain in right knee: Secondary | ICD-10-CM | POA: Diagnosis not present

## 2019-10-31 DIAGNOSIS — Z23 Encounter for immunization: Secondary | ICD-10-CM | POA: Diagnosis not present

## 2019-11-19 DIAGNOSIS — M170 Bilateral primary osteoarthritis of knee: Secondary | ICD-10-CM | POA: Diagnosis not present

## 2019-11-19 DIAGNOSIS — M1712 Unilateral primary osteoarthritis, left knee: Secondary | ICD-10-CM | POA: Diagnosis not present

## 2019-11-19 DIAGNOSIS — M25562 Pain in left knee: Secondary | ICD-10-CM | POA: Diagnosis not present

## 2019-11-25 ENCOUNTER — Other Ambulatory Visit (HOSPITAL_COMMUNITY)
Admission: RE | Admit: 2019-11-25 | Discharge: 2019-11-25 | Disposition: A | Discharge status: Home / Self Care | Payer: Medicare Other | Source: Ambulatory Visit | Attending: Cardiology | Admitting: Cardiology

## 2019-11-25 DIAGNOSIS — Z20822 Contact with and (suspected) exposure to covid-19: Secondary | ICD-10-CM | POA: Diagnosis not present

## 2019-11-25 DIAGNOSIS — Z01812 Encounter for preprocedural laboratory examination: Secondary | ICD-10-CM | POA: Insufficient documentation

## 2019-11-25 LAB — SARS CORONAVIRUS 2 (TAT 6-24 HRS): SARS Coronavirus 2: NEGATIVE

## 2019-11-26 ENCOUNTER — Telehealth (HOSPITAL_COMMUNITY): Payer: Self-pay

## 2019-11-26 NOTE — Telephone Encounter (Signed)
Encounter complete. 

## 2019-11-27 DIAGNOSIS — M25562 Pain in left knee: Secondary | ICD-10-CM | POA: Diagnosis not present

## 2019-11-27 DIAGNOSIS — M1712 Unilateral primary osteoarthritis, left knee: Secondary | ICD-10-CM | POA: Diagnosis not present

## 2019-11-28 ENCOUNTER — Ambulatory Visit (HOSPITAL_COMMUNITY)
Admission: RE | Admit: 2019-11-28 | Discharge: 2019-11-28 | Disposition: A | Discharge status: Home / Self Care | Payer: Medicare Other | Source: Ambulatory Visit | Attending: Internal Medicine | Admitting: Internal Medicine

## 2019-11-28 ENCOUNTER — Other Ambulatory Visit: Payer: Self-pay

## 2019-11-28 DIAGNOSIS — R0602 Shortness of breath: Secondary | ICD-10-CM | POA: Insufficient documentation

## 2019-11-28 LAB — EXERCISE TOLERANCE TEST
Estimated workload: 6.8 METS
Exercise duration (min): 4 min
Exercise duration (sec): 54 s
MPHR: 152 {beats}/min
Peak HR: 144 {beats}/min
Percent HR: 94 %
RPE: 18
Rest HR: 101 {beats}/min

## 2019-12-02 NOTE — Progress Notes (Signed)
Cardiology Office Note   Date:  12/03/2019   ID:  Tulsi, Graeter 05-12-1951, MRN [id number]  PCP:  [name], MD  Cardiologist:   [name], MD   Chief Complaint  Patient presents with  . Coronary Calcium      History of Present Illness: Omayra Packer is a 69 y.o. female who presents for follow up of coronary calcium. Her level is greater than 99 percentile for age.  I sent her for a POET (Plain Old Exercise Treadmill) in April 2018 that was negative for evidence of ischemia.  She returns for follow up.  She has had a treadmill the other day that was also negative for any evidence of ischemia.  This was done to follow-up her calcium score which is greater than 800 in 2016.  Since I last saw her she has actually done better.  Of note she was going to have a sleep study because of a high pretest probability of sleep apnea.  She is lost about 24 pounds.  She has not had injections in her knees.  She is walking routinely. The patient denies any new symptoms such as chest discomfort, neck or arm discomfort. There has been no new shortness of breath, PND or orthopnea. There have been no reported palpitations, presyncope or syncope.      Past Medical History:  Diagnosis Date  . Agatston coronary artery calcium score greater than 400   . Diabetes mellitus ([place])    x 3 years  . DJD (degenerative joint disease)   . Encephalitis   . Fibromyalgia   . HTN (hypertension)   . Hyperlipidemia   . Sleep apnea    No CPAP    Past Surgical History:  Procedure Laterality Date  . APPENDECTOMY    . BREAST CYST EXCISION    . BREAST EXCISIONAL BIOPSY Left 2004  . KNEE ARTHROSCOPY       Current Outpatient Medications  Medication Sig Dispense Refill  . amLODipine-valsartan (EXFORGE) 10-320 MG per tablet Take 1 tablet by mouth daily.    Marland Kitchen aspirin 81 MG tablet Take 81 mg by mouth daily.    . beta carotene w/minerals (OCUVITE) tablet Take 1 tablet by mouth daily.      . DULoxetine (CYMBALTA) 60 MG capsule Take 60 mg by mouth daily.    Marland Kitchen EPINEPHrine 0.3 mg/0.3 mL IJ SOAJ injection Inject 0.3 mg into the muscle as needed.    . Evolocumab (REPATHA [place]) Inject into the skin every morning. Every other week    . Exenatide (BYDUREON [place]) Inject into the skin once a week.    . fesoterodine (TOVIAZ) 4 MG TB24 tablet Take 4 mg by mouth daily.    . metFORMIN (GLUCOPHAGE) 1000 MG tablet Take 1,000 mg by mouth 2 (two) times daily with a meal.    . ondansetron (ZOFRAN-ODT) 4 MG disintegrating tablet Take 1 tablet (4 mg total) by mouth every 8 (eight) hours as needed for nausea or vomiting. 8 tablet 0  . phentermine 37.5 MG capsule Take 37.5 mg by mouth every morning.    . valACYclovir HCl (VALTREX PO) valacyclovir    . ezetimibe (ZETIA) 10 MG tablet Take 1 tablet (10 mg total) by mouth daily. 90 tablet 3   No current facility-administered medications for this visit.    Allergies:   Betadine [povidone iodine], Contrast media [iodinated diagnostic agents], Iodine, Povidone-iodine, Shellfish allergy, and Hydrocodone-acetaminophen    ROS:  Please see the history of present  illness.   Otherwise, review of systems are positive for none.   All other systems are reviewed and negative.    PHYSICAL EXAM: VS:  BP 115/78   Pulse 93   Temp (!) 96.6 F (35.9 C)   Ht 5' (1.524 m)   Wt 224 lb 3.2 oz (101.7 kg)   SpO2 97%   BMI 43.79 kg/m  , BMI Body mass index is 43.79 kg/m. GENERAL:  Well appearing NECK:  No jugular venous distention, waveform within normal limits, carotid upstroke brisk and symmetric, no bruits, no thyromegaly LUNGS:  Clear to auscultation bilaterally BACK:  No CVA tenderness CHEST:  Unremarkable HEART:  PMI not displaced or sustained,S1 and S2 within normal limits, no S3, no S4, no clicks, no rubs, no murmurs ABD:  Flat, positive bowel sounds normal in frequency in pitch, no bruits, no rebound, no guarding, no midline pulsatile mass, no hepatomegaly, no  splenomegaly EXT:  2 plus pulses throughout, no edema, no cyanosis no clubbing   EKG:  EKG is ordered today. The ekg ordered today demonstrates sinus rhythm, rate 93, axis within normal limits, intervals within normal limits, no acute ST-T wave changes.   Recent Labs: No results found for requested labs within last 8760 hours.      Wt Readings from Last 3 Encounters:  12/03/19 224 lb 3.2 oz (101.7 kg)  12/30/18 248 lb 12.8 oz (112.9 kg)  11/27/18 244 lb (110.7 kg)      Other studies Reviewed: Additional studies/ records that were reviewed today include: Labs. Review of the above records demonstrates:  Please see elsewhere in the note.     ASSESSMENT AND PLAN:   CORONARY CALCIUM:   She just had a negative POET.  We are going to be very aggressive with risk reduction as below.  She will continue with primary prevention.  There is no suggestion of obstructive coronary disease.  DYSLIPIDEMIA: Her LDL most recently was 149 on the Repatha.  I am going to add Zetia 10 mg daily.  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  The goal LDL should be less than 70.  FAITUGE:   She is snoring but she is much better than she was.  We had talked about doing a sleep study but as she loses weight I think this is the therapy of choice and we will hold off on the sleep study.  COVID:  She had her vaccines.    Current medicines are reviewed at length with the patient today.  The patient does not have concerns regarding medicines.  The following changes have been made:  As above  Labs/ tests ordered today include:   Orders Placed This Encounter  Procedures  . AMB Referral to [facility]  . EKG 12-Lead     Disposition:   FU with me in one year.     Signed, [name], MD  12/03/2019 10:10 AM    [facility]

## 2019-12-03 ENCOUNTER — Ambulatory Visit (INDEPENDENT_AMBULATORY_CARE_PROVIDER_SITE_OTHER): Payer: Medicare Other | Admitting: Cardiology

## 2019-12-03 ENCOUNTER — Encounter: Payer: Self-pay | Admitting: Cardiology

## 2019-12-03 ENCOUNTER — Other Ambulatory Visit: Payer: Self-pay

## 2019-12-03 VITALS — BP 115/78 | HR 93 | Temp 96.6°F | Ht 60.0 in | Wt 224.2 lb

## 2019-12-03 DIAGNOSIS — E785 Hyperlipidemia, unspecified: Secondary | ICD-10-CM | POA: Diagnosis not present

## 2019-12-03 DIAGNOSIS — M1712 Unilateral primary osteoarthritis, left knee: Secondary | ICD-10-CM | POA: Diagnosis not present

## 2019-12-03 DIAGNOSIS — R0602 Shortness of breath: Secondary | ICD-10-CM | POA: Diagnosis not present

## 2019-12-03 DIAGNOSIS — M25562 Pain in left knee: Secondary | ICD-10-CM | POA: Diagnosis not present

## 2019-12-03 MED ORDER — EZETIMIBE 10 MG PO TABS: 10.0000 mg | ORAL_TABLET | Freq: Every day | ORAL | 3 refills | Status: DC

## 2019-12-03 NOTE — Patient Instructions (Signed)
Medication Instructions:  START ZETIA 10MG  DAILY *If you need a refill on your cardiac medications before your next appointment, please call your pharmacy*  Lab Work: NONE ORDERED THIS VISIT  Testing/Procedures: NONE ORDERED THIS VISIT  Follow-Up: At [facility], you and your health needs are our priority.  As part of our continuing mission to provide you with exceptional heart care, we have created designated Provider Care Teams.  These Care Teams include your primary Cardiologist (physician) and Advanced Practice Providers (APPs -  Physician Assistants and Nurse Practitioners) who all work together to provide you with the care you need, when you need it.  Your next appointment:   12 month(s)  The format for your next appointment:   In Person  Provider:   [name], MD  Other Instructions REFERRED TO DR. [facility]

## 2019-12-10 ENCOUNTER — Other Ambulatory Visit: Payer: Self-pay

## 2019-12-10 DIAGNOSIS — M25562 Pain in left knee: Secondary | ICD-10-CM | POA: Diagnosis not present

## 2019-12-10 DIAGNOSIS — M1712 Unilateral primary osteoarthritis, left knee: Secondary | ICD-10-CM | POA: Diagnosis not present

## 2019-12-19 DIAGNOSIS — M1712 Unilateral primary osteoarthritis, left knee: Secondary | ICD-10-CM | POA: Diagnosis not present

## 2019-12-19 DIAGNOSIS — M25562 Pain in left knee: Secondary | ICD-10-CM | POA: Diagnosis not present

## 2020-01-29 ENCOUNTER — Telehealth (INDEPENDENT_AMBULATORY_CARE_PROVIDER_SITE_OTHER): Payer: Medicare Other | Admitting: Internal Medicine

## 2020-01-29 ENCOUNTER — Encounter: Payer: Self-pay | Admitting: Internal Medicine

## 2020-01-29 VITALS — Ht 60.0 in | Wt 220.0 lb

## 2020-01-29 DIAGNOSIS — T466X5A Adverse effect of antihyperlipidemic and antiarteriosclerotic drugs, initial encounter: Secondary | ICD-10-CM

## 2020-01-29 DIAGNOSIS — R931 Abnormal findings on diagnostic imaging of heart and coronary circulation: Secondary | ICD-10-CM

## 2020-01-29 DIAGNOSIS — M791 Myalgia, unspecified site: Secondary | ICD-10-CM

## 2020-01-29 DIAGNOSIS — E119 Type 2 diabetes mellitus without complications: Secondary | ICD-10-CM

## 2020-01-29 DIAGNOSIS — E7849 Other hyperlipidemia: Secondary | ICD-10-CM

## 2020-01-29 MED ORDER — PRAVASTATIN SODIUM 40 MG PO TABS: 40.0000 mg | ORAL_TABLET | Freq: Every evening | ORAL | 2 refills | Status: DC

## 2020-01-29 NOTE — Progress Notes (Signed)
Virtual Visit via Telephone Note   This visit type was conducted due to national recommendations for restrictions regarding the COVID-19 Pandemic (e.g. social distancing) in an effort to limit this patient's exposure and mitigate transmission in our community.  Due to her co-morbid illnesses, this patient is at least at moderate risk for complications without adequate follow up.  This format is felt to be most appropriate for this patient at this time.  The patient did not have access to video technology/had technical difficulties with video requiring transitioning to audio format only (telephone).  All issues noted in this document were discussed and addressed.  No physical exam could be performed with this format.  Please refer to the patient's chart for her  consent to telehealth for [facility].   Evaluation Performed:  Telephone consult  Date:  01/29/2020   ID:  Hassell Done, DOB Aug 31, 1950, MRN [id number]  Patient Location:  [place] [address]  Provider location:   [address], [place] [address]  PCP:  [name], MD  Cardiologist:  [name], MD Electrophysiologist:  None   Chief Complaint:  Dyslipidemia  History of Present Illness:    Tammy [name] is a 69 y.o. female who presents via audio/video conferencing for a telehealth visit today.  Ms. Lablanc is a pleasant female who was kindly referred by Dr. Percival Spanish for additional management of dyslipidemia.  She is a patient of Dr. Brigitte Pulse, her primary care physician who has been trying to manage her significant dyslipidemia.  She has a history of high cholesterol with LDL around 250 untreated and a strong family history of heart disease including both parents (who incidentally are first cousins).  Other risk factors include type 2 diabetes, hypertension, COPD and sleep apnea (not on CPAP.  In 2016 she underwent coronary artery calcium scoring which demonstrated a  high coronary calcium score of 873, 99th percentile for age and sex matched controls.  At this it does not seem that she has had anginal symptoms.  Stress test in 2016 which was low risk and no evidence of reversible ischemia.  Since then she has had 2 plain exercise treadmill tests (low sensitivity), however they were negative for ischemia.  Unfortunately, she has not been able to tolerate statins in the past.  She believes she had myalgias but is not clear on the side effects.  Subsequently she has been on Repatha for about a year and has had significant reduction in her cholesterol however LDL remains elevated at 149 as of March 2021.  Based on this persistent elevation in LDL cholesterol, Dr. Percival Spanish started her on ezetimibe.  She said she could not tolerate it but could not explain what the side effects were and discontinue the medication.  We had a long discussion about the importance of aggressive lipid-lowering and the fact that we may need to retry statins or consider some of the limited other options to lower cholesterol further.  She does seem agreeable to this.  We also cussed genetic testing for familial hyperlipidemia which I suspect she has.  She does have a brother who had recently had bypass surgery.  She has no children, however.  After discussing cost she declined testing.  The patient does not have symptoms concerning for COVID-19 infection (fever, chills, cough, or new SHORTNESS OF BREATH).    Prior CV studies:   The following studies were reviewed today:  Chart reviewed, lab work  PMHx:  Past Medical History:  Diagnosis Date  . Agatston coronary artery calcium score greater than 400   . Diabetes mellitus ([place])    x 3 years  . DJD (degenerative joint disease)   . Encephalitis   . Fibromyalgia   . HTN (hypertension)   . Hyperlipidemia   . Sleep apnea    No CPAP    Past Surgical History:  Procedure Laterality Date  . APPENDECTOMY    . BREAST CYST EXCISION    . BREAST  EXCISIONAL BIOPSY Left 2004  . KNEE ARTHROSCOPY      FAMHx:  Family History  Problem Relation Age of Onset  . CAD Mother 29  . CAD Brother 23  . Breast cancer Cousin     SOCHx:   reports that she quit smoking about 11 years ago. Her smoking use included cigarettes. She has a 30.00 pack-year smoking history. She has never used smokeless tobacco. She reports that she does not drink alcohol and does not use drugs.  ALLERGIES:  Allergies  Allergen Reactions  . Betadine [Povidone Iodine] Anaphylaxis  . Contrast Media [Iodinated Diagnostic Agents] Anaphylaxis  . Iodine Anaphylaxis  . Povidone-Iodine Anaphylaxis  . Shellfish Allergy Anaphylaxis  . Hydrocodone-Acetaminophen Nausea Only    MEDS:  Current Meds  Medication Sig  . amLODipine (NORVASC) 10 MG tablet Take 10 mg by mouth daily.  Marland Kitchen aspirin 81 MG tablet Take 81 mg by mouth daily.  . beta carotene w/minerals (OCUVITE) tablet Take 1 tablet by mouth daily.  . DULoxetine (CYMBALTA) 60 MG capsule Take 60 mg by mouth daily.  Marland Kitchen EPINEPHrine 0.3 mg/0.3 mL IJ SOAJ injection Inject 0.3 mg into the muscle as needed.  . Evolocumab (REPATHA [place]) Inject into the skin every morning. Every other week  . Exenatide (BYDUREON [place]) Inject into the skin once a week.  . fesoterodine (TOVIAZ) 4 MG TB24 tablet Take 4 mg by mouth daily.  . meloxicam (MOBIC) 15 MG tablet Take 15 mg by mouth 2 (two) times daily.  . metFORMIN (GLUCOPHAGE) 1000 MG tablet Take 1,000 mg by mouth 2 (two) times daily with a meal.  . valACYclovir HCl (VALTREX PO) valacyclovir  . valsartan-hydrochlorothiazide (DIOVAN-HCT) 320-25 MG tablet Take 1 tablet by mouth daily.  . [DISCONTINUED] ezetimibe (ZETIA) 10 MG tablet Take 1 tablet (10 mg total) by mouth daily.     ROS: Pertinent items noted in HPI and remainder of comprehensive ROS otherwise negative.  Labs/Other Tests and Data Reviewed:    Recent Labs: No results found for requested labs within last 8760 hours.    Recent Lipid Panel Lab Results  Component Value Date/Time   CHOL 285 (H) 04/23/2015 11:44 AM   TRIG 140 04/23/2015 11:44 AM   HDL 50 04/23/2015 11:44 AM   CHOLHDL 5.7 (H) 04/23/2015 11:44 AM   LDLCALC 207 (H) 04/23/2015 11:44 AM    Wt Readings from Last 3 Encounters:  01/29/20 220 lb (99.8 kg)  12/03/19 224 lb 3.2 oz (101.7 kg)  12/30/18 248 lb 12.8 oz (112.9 kg)     Exam:    Vital Signs:  Ht 5' (1.524 m)   Wt 220 lb (99.8 kg)   BMI 42.97 kg/m    Exam deferred due to telephone visit  ASSESSMENT & PLAN:    1. Probable familial hyperlipidemia -Dutch score of 6 2. Multivessel coronary artery calcium-calcium score 873, 99th percentile 3. COPD 4. Type 2 diabetes 5. Hypertension 6. Strong family history of coronary disease in both parents 81. Statin myalgias, Zetia intolerant  Ms. Shan Levans has probable familial hyperlipidemia with good response to Repatha however LDL cholesterol untreated around 250.  This is most consistent with a familial hyperlipidemia.  Additionally, she has significant multivessel coronary disease although seems to not have had any events or ischemia at this point.  She needs aggressive additional lipid-lowering.  A statin would be ideal although she has been intolerant to a number in the past.  She is willing to try another statin and she has not previously been on pravastatin.  We will start pravastatin 40 mg daily.  If tolerated repeat lipids in about 3 months.  If not, then we may consider adding bempedoic acid.  Thanks again for the kind referral.  COVID-19 Education: The signs and symptoms of COVID-19 were discussed with the patient and how to seek care for testing (follow up with PCP or arrange E-visit).  The importance of social distancing was discussed today.  Patient Risk:   After full review of this patients clinical status, I feel that they are at least moderate risk at this time.  Time:   Today, I have spent 25 minutes with the patient with  telehealth technology discussing dyslipidemia, familial hyperlipidemia, coronary artery disease, lipid-lowering strategies.     Medication Adjustments/Labs and Tests Ordered: Current medicines are reviewed at length with the patient today.  Concerns regarding medicines are outlined above.   Tests Ordered: Orders Placed This Encounter  Procedures  . Lipid panel    Medication Changes: Meds ordered this encounter  Medications  . pravastatin (PRAVACHOL) 40 MG tablet    Sig: Take 1 tablet (40 mg total) by mouth every evening.    Dispense:  30 tablet    Refill:  2    Disposition:  in 3 month(s)  Pixie Casino, MD, [facility], [place] Director of the Advanced Lipid Disorders &  Cardiovascular Risk Reduction Clinic Diplomate of the American Board of Clinical Lipidology Attending Cardiologist  Direct Dial: [phone]  Fax: [phone]  Website:  [email or web address]  Pixie Casino, MD  01/29/2020 8:36 AM

## 2020-01-29 NOTE — Patient Instructions (Signed)
Medication Instructions:  BEGIN TAKING PRAVASTATIN 40MG  DAILY STOP ZETIA *If you need a refill on your cardiac medications before your next appointment, please call your pharmacy*   Lab Work: IN 3 MONTHS- FASTING LAB WORK LIPIDS If you have labs (blood work) drawn today and your tests are completely normal, you will receive your results only by: Marland Kitchen MyChart Message (if you have MyChart) OR . A paper copy in the mail If you have any lab test that is abnormal or we need to change your treatment, we will call you to review the results.    Follow-Up: At [facility], you and your health needs are our priority.  As part of our continuing mission to provide you with exceptional heart care, we have created designated Provider Care Teams.  These Care Teams include your primary Cardiologist (physician) and Advanced Practice Providers (APPs -  Physician Assistants and Nurse Practitioners) who all work together to provide you with the care you need, when you need it.  We recommend signing up for the patient portal called "MyChart".  Sign up information is provided on this After Visit Summary.  MyChart is used to connect with patients for Virtual Visits (Telemedicine).  Patients are able to view lab/test results, encounter notes, upcoming appointments, etc.  Non-urgent messages can be sent to your provider as well.   To learn more about what you can do with MyChart, go to [email or web address].    Your next appointment:   3 month(s)  The format for your next appointment:   In Person  Provider:   K. Mali Hilty, MD

## 2020-02-02 ENCOUNTER — Other Ambulatory Visit: Payer: Self-pay

## 2020-02-02 ENCOUNTER — Ambulatory Visit (INDEPENDENT_AMBULATORY_CARE_PROVIDER_SITE_OTHER): Payer: Medicare Other

## 2020-02-02 ENCOUNTER — Ambulatory Visit (INDEPENDENT_AMBULATORY_CARE_PROVIDER_SITE_OTHER): Payer: Medicare Other | Admitting: Orthopaedic Surgery

## 2020-02-02 ENCOUNTER — Encounter: Payer: Self-pay | Admitting: Orthopaedic Surgery

## 2020-02-02 VITALS — Ht 65.0 in | Wt 220.0 lb

## 2020-02-02 DIAGNOSIS — M25551 Pain in right hip: Secondary | ICD-10-CM

## 2020-02-02 DIAGNOSIS — M25552 Pain in left hip: Secondary | ICD-10-CM

## 2020-02-02 NOTE — Progress Notes (Signed)
Office Visit Note   Patient: Tammy [name]           Date of Birth: April 15, 1951           MRN: [id number] Visit Date: 02/02/2020              Requested by: [name], MD [address] [place],  [place] [address] PCP: [name], MD   Assessment & Plan: Visit Diagnoses:  1. Bilateral hip pain     Plan: Right now, I do feel that her biggest issues are her knees and this may be driving her hip pain and her leg pain.  She denies any back pain at all.  She denies any numbness and tingling in her legs.  Just monitoring her gait I do feel that she is walking different from her knees.  I did offer her a steroid injection under direct fluoroscopy in her right hip if it is getting worse for her.  Right now she says is not bad enough to have any type of injections.  If things worsen of her in any way she knows to give [place] a call.  Follow-up can be as needed.  All questions and concerns were answered and addressed.  Follow-Up Instructions: Return if symptoms worsen or fail to improve.   Orders:  Orders Placed This Encounter  Procedures  . XR Pelvis 1-2 Views  . XR Lumbar Spine 2-3 Views   No orders of the defined types were placed in this encounter.     Procedures: No procedures performed   Clinical Data: No additional findings.   Subjective: Chief Complaint  Patient presents with  . multiple joint pain    pain   The patient is a very pleasant 69 year old sent from Dr. Steffanie Rainwater of [facility] to evaluate bilateral hip pain and low back pain.  She does have a history of bilateral knee pain.  She does go to [facility] for injections in her knees and lasted out about 3 months ago and she says that helps.  She does report occasional groin pain.  She reports bilateral leg pain and some low back pain.  She is someone who is obese and that could put pressure on her knees and her hips and her back.  She says that she has been told she has arthritis in  multiple areas.  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 Burney Gauze is going to address that at some point from a surgical perspective.  She is sent here for further ration treatment of other arthritic joints and complaints.  She has had no acute change in her medical status.  It does hurt her to mobilize daily.  Some things are worse than the others and is not a daily occurrence, but it does detrimentally affect her mobility and her quality of life as well as her actives daily living.  HPI  Review of Systems She currently denies any headache, chest pain, shortness of breath, fever, chills, nausea, vomiting  Objective: Vital Signs: Ht 5\' 5"  (1.651 m)   Wt 220 lb (99.8 kg)   BMI 36.61 kg/m   Physical Exam She is alert and orient x3 and in no acute distress Ortho Exam With her sitting examination of both hips shows full internal and external rotation with no pain in the groin at all and no blocks to rotation.  Both knees slightly hyperextend.  There is definitely patellofemoral grinding of both knees with  the left worse than the right.  Both knees are ligamentously stable.  Neither knee has an effusion today. Specialty Comments:  No specialty comments available.  Imaging: XR Lumbar Spine 2-3 Views  Result Date: 02/02/2020 2 views of the lumbar spine show no acute findings.  There is significant degenerative disc disease at multiple levels with the most normal level being surprisingly L5-S1.  There is a grade 1 spondylolisthesis of L4 on L5.  There is significant the space narrowing at all the levels above L5-S1.  XR Pelvis 1-2 Views  Result Date: 02/02/2020 A low AP pelvis shows some slight joint space narrowing on the right when comparing the right and left hips.  They are both well located.  There is no significant arthritic findings of the right hip at all.    PMFS History: Patient Active Problem List   Diagnosis Date Noted  . Chronic venous  insufficiency 12/30/2018  . Varicose veins of both lower extremities with inflammation 12/30/2018  . DJD (degenerative joint disease) 12/30/2018  . Snoring 11/27/2018  . Daytime sleepiness 11/27/2018  . Excessive sleepiness 11/27/2018  . Educated about COVID-19 virus infection 11/27/2018  . SOB (shortness of breath) 11/27/2018  . Hyperlipidemia 05/20/2015  . Knee pain 06/06/2012   Past Medical History:  Diagnosis Date  . Agatston coronary artery calcium score greater than 400   . Diabetes mellitus ([place])    x 3 years  . DJD (degenerative joint disease)   . Encephalitis   . Fibromyalgia   . HTN (hypertension)   . Hyperlipidemia   . Sleep apnea    No CPAP    Family History  Problem Relation Age of Onset  . CAD Mother 41  . CAD Brother 11  . Breast cancer Cousin     Past Surgical History:  Procedure Laterality Date  . APPENDECTOMY    . BREAST CYST EXCISION    . BREAST EXCISIONAL BIOPSY Left 2004  . KNEE ARTHROSCOPY     Social History   Occupational History  . Not on file  Tobacco Use  . Smoking status: Former Smoker    Packs/day: 1.00    Years: 30.00    Pack years: 30.00    Types: Cigarettes    Quit date: 11/01/2008    Years since quitting: 11.2  . Smokeless tobacco: Never Used  Vaping Use  . Vaping Use: Never used  Substance and Sexual Activity  . Alcohol use: No    Alcohol/week: 0.0 standard drinks  . Drug use: No  . Sexual activity: Not on file

## 2020-02-23 ENCOUNTER — Telehealth: Payer: Self-pay | Admitting: Orthopaedic Surgery

## 2020-02-24 NOTE — Telephone Encounter (Signed)
e

## 2020-03-03 ENCOUNTER — Ambulatory Visit (INDEPENDENT_AMBULATORY_CARE_PROVIDER_SITE_OTHER): Payer: Medicare Other | Admitting: Orthopaedic Surgery

## 2020-03-03 ENCOUNTER — Encounter: Payer: Self-pay | Admitting: Orthopaedic Surgery

## 2020-03-03 DIAGNOSIS — M25571 Pain in right ankle and joints of right foot: Secondary | ICD-10-CM | POA: Diagnosis not present

## 2020-03-03 DIAGNOSIS — M25572 Pain in left ankle and joints of left foot: Secondary | ICD-10-CM

## 2020-03-03 DIAGNOSIS — M25552 Pain in left hip: Secondary | ICD-10-CM

## 2020-03-03 DIAGNOSIS — M25551 Pain in right hip: Secondary | ICD-10-CM

## 2020-03-03 DIAGNOSIS — G8929 Other chronic pain: Secondary | ICD-10-CM | POA: Diagnosis not present

## 2020-03-03 MED ORDER — GABAPENTIN 300 MG PO CAPS: 300.0000 mg | ORAL_CAPSULE | Freq: Every day | ORAL | 1 refills | Status: DC

## 2020-03-03 NOTE — Progress Notes (Signed)
Office Visit Note   Patient: Tammy [name]           Date of Birth: 07-04-1951           MRN: [id number] Visit Date: 03/03/2020              Requested by: [name], MD [address] [place],   [address] PCP: [name], MD   Assessment & Plan: Visit Diagnoses:  1. Pain in right hip   2. Bilateral hip pain   3. Chronic pain of both ankles     Plan: From my standpoint, there is not an injection I would recommend based on her clinical exam findings and what she is describing.  She would benefit more from outpatient physical therapy for generalized conditioning, weight loss, core strengthening, low back strengthening and bilateral lower extremity strengthening.  She is not getting comfortable rested nights at least try some Neurontin at 300 mg to take just at bedtime to see if this will help some.  She is already on meloxicam as well.  She agrees with this treatment plan.  All questions and concerns were answered and addressed.  We will see her back in 6 weeks after course of therapy and to see if the Neurontin is helping any.  Follow-Up Instructions: Return in about 6 weeks (around 04/14/2020).   Orders:  No orders of the defined types were placed in this encounter.  Meds ordered this encounter  Medications  . gabapentin (NEURONTIN) 300 MG capsule    Sig: Take 1 capsule (300 mg total) by mouth at bedtime.    Dispense:  30 capsule    Refill:  1      Procedures: No procedures performed   Clinical Data: No additional findings.   Subjective: Chief Complaint  Patient presents with  . Left Hip - Pain  The patient is being seen in follow-up today.  When she was sent to me from Dr. Brigitte Pulse from [facility], she was dealing with bilateral hip pain.  She says today is really her ankles that hurt however she does report that she has "all over body pain".  She is someone that does have a higher BMI.  When she walks both her knees have slight valgus  malalignment and you can tell that she has a hard time walking in general.  Her x-rays of her hips did not show any significant arthritic changes with mainly just some slight right hip narrowing but only slight.  We talked about the possibility of a epidural steroid injection versus a hip injection however she has not had any type of MRI of her lumbar spine.  The plain films show degenerative changes at multiple levels.  She denies any radicular type symptoms.  There is been no other acute change in her medical status.  HPI  Review of Systems She currently denies any headache, chest pain, shortness of breath, fever, chills, nausea, vomiting  Objective: Vital Signs: There were no vitals taken for this visit.  Physical Exam She is alert and orient x3 and in no acute Ortho Exam Examination of both hips show the move smoothly and fluidly.  Both knees hyperextend a little bit with some valgus malalignment but there is no effusion of either knee the move well.  Both ankles actually move well today. Specialty Comments:  No specialty comments available.  Imaging: No results found.   PMFS History: Patient Active Problem List   Diagnosis Date Noted  . Chronic venous insufficiency  12/30/2018  . Varicose veins of both lower extremities with inflammation 12/30/2018  . DJD (degenerative joint disease) 12/30/2018  . Snoring 11/27/2018  . Daytime sleepiness 11/27/2018  . Excessive sleepiness 11/27/2018  . Educated about COVID-19 virus infection 11/27/2018  . SOB (shortness of breath) 11/27/2018  . Hyperlipidemia 05/20/2015  . Knee pain 06/06/2012   Past Medical History:  Diagnosis Date  . Agatston coronary artery calcium score greater than 400   . Diabetes mellitus ([place])    x 3 years  . DJD (degenerative joint disease)   . Encephalitis   . Fibromyalgia   . HTN (hypertension)   . Hyperlipidemia   . Sleep apnea    No CPAP    Family History  Problem Relation Age of Onset  . CAD Mother  27  . CAD Brother 35  . Breast cancer Cousin     Past Surgical History:  Procedure Laterality Date  . APPENDECTOMY    . BREAST CYST EXCISION    . BREAST EXCISIONAL BIOPSY Left 2004  . KNEE ARTHROSCOPY     Social History   Occupational History  . Not on file  Tobacco Use  . Smoking status: Former Smoker    Packs/day: 1.00    Years: 30.00    Pack years: 30.00    Types: Cigarettes    Quit date: 11/01/2008    Years since quitting: 11.3  . Smokeless tobacco: Never Used  Vaping Use  . Vaping Use: Never used  Substance and Sexual Activity  . Alcohol use: No    Alcohol/week: 0.0 standard drinks  . Drug use: No  . Sexual activity: Not on file

## 2020-03-25 DIAGNOSIS — M1712 Unilateral primary osteoarthritis, left knee: Secondary | ICD-10-CM | POA: Diagnosis not present

## 2020-03-25 DIAGNOSIS — M25561 Pain in right knee: Secondary | ICD-10-CM | POA: Diagnosis not present

## 2020-03-25 DIAGNOSIS — M170 Bilateral primary osteoarthritis of knee: Secondary | ICD-10-CM | POA: Diagnosis not present

## 2020-03-25 DIAGNOSIS — M25562 Pain in left knee: Secondary | ICD-10-CM | POA: Diagnosis not present

## 2020-03-25 DIAGNOSIS — E785 Hyperlipidemia, unspecified: Secondary | ICD-10-CM | POA: Diagnosis not present

## 2020-03-26 DIAGNOSIS — M25561 Pain in right knee: Secondary | ICD-10-CM | POA: Diagnosis not present

## 2020-03-26 DIAGNOSIS — M1711 Unilateral primary osteoarthritis, right knee: Secondary | ICD-10-CM | POA: Diagnosis not present

## 2020-03-29 DIAGNOSIS — M545 Low back pain: Secondary | ICD-10-CM | POA: Diagnosis not present

## 2020-03-31 DIAGNOSIS — I10 Essential (primary) hypertension: Secondary | ICD-10-CM | POA: Diagnosis not present

## 2020-03-31 DIAGNOSIS — I2510 Atherosclerotic heart disease of native coronary artery without angina pectoris: Secondary | ICD-10-CM | POA: Diagnosis not present

## 2020-03-31 DIAGNOSIS — M1712 Unilateral primary osteoarthritis, left knee: Secondary | ICD-10-CM | POA: Diagnosis not present

## 2020-03-31 DIAGNOSIS — J439 Emphysema, unspecified: Secondary | ICD-10-CM | POA: Diagnosis not present

## 2020-03-31 DIAGNOSIS — E1149 Type 2 diabetes mellitus with other diabetic neurological complication: Secondary | ICD-10-CM | POA: Diagnosis not present

## 2020-03-31 DIAGNOSIS — E785 Hyperlipidemia, unspecified: Secondary | ICD-10-CM | POA: Diagnosis not present

## 2020-03-31 DIAGNOSIS — G4733 Obstructive sleep apnea (adult) (pediatric): Secondary | ICD-10-CM | POA: Diagnosis not present

## 2020-03-31 DIAGNOSIS — I6523 Occlusion and stenosis of bilateral carotid arteries: Secondary | ICD-10-CM | POA: Diagnosis not present

## 2020-03-31 DIAGNOSIS — E1129 Type 2 diabetes mellitus with other diabetic kidney complication: Secondary | ICD-10-CM | POA: Diagnosis not present

## 2020-03-31 DIAGNOSIS — M25562 Pain in left knee: Secondary | ICD-10-CM | POA: Diagnosis not present

## 2020-04-01 DIAGNOSIS — M1711 Unilateral primary osteoarthritis, right knee: Secondary | ICD-10-CM | POA: Diagnosis not present

## 2020-04-01 DIAGNOSIS — M25561 Pain in right knee: Secondary | ICD-10-CM | POA: Diagnosis not present

## 2020-04-02 DIAGNOSIS — M545 Low back pain: Secondary | ICD-10-CM | POA: Diagnosis not present

## 2020-04-05 DIAGNOSIS — M545 Low back pain: Secondary | ICD-10-CM | POA: Diagnosis not present

## 2020-04-07 DIAGNOSIS — M25562 Pain in left knee: Secondary | ICD-10-CM | POA: Diagnosis not present

## 2020-04-07 DIAGNOSIS — M1712 Unilateral primary osteoarthritis, left knee: Secondary | ICD-10-CM | POA: Diagnosis not present

## 2020-04-08 DIAGNOSIS — M545 Low back pain: Secondary | ICD-10-CM | POA: Diagnosis not present

## 2020-04-12 DIAGNOSIS — M545 Low back pain: Secondary | ICD-10-CM | POA: Diagnosis not present

## 2020-04-13 DIAGNOSIS — Z961 Presence of intraocular lens: Secondary | ICD-10-CM | POA: Diagnosis not present

## 2020-04-13 DIAGNOSIS — E119 Type 2 diabetes mellitus without complications: Secondary | ICD-10-CM | POA: Diagnosis not present

## 2020-04-14 ENCOUNTER — Ambulatory Visit (INDEPENDENT_AMBULATORY_CARE_PROVIDER_SITE_OTHER): Payer: Medicare Other | Admitting: Orthopaedic Surgery

## 2020-04-14 ENCOUNTER — Encounter: Payer: Self-pay | Admitting: Orthopaedic Surgery

## 2020-04-14 ENCOUNTER — Other Ambulatory Visit: Payer: Self-pay

## 2020-04-14 DIAGNOSIS — M1711 Unilateral primary osteoarthritis, right knee: Secondary | ICD-10-CM | POA: Diagnosis not present

## 2020-04-14 DIAGNOSIS — M4807 Spinal stenosis, lumbosacral region: Secondary | ICD-10-CM

## 2020-04-14 DIAGNOSIS — M5442 Lumbago with sciatica, left side: Secondary | ICD-10-CM

## 2020-04-14 DIAGNOSIS — M25561 Pain in right knee: Secondary | ICD-10-CM | POA: Diagnosis not present

## 2020-04-14 DIAGNOSIS — G8929 Other chronic pain: Secondary | ICD-10-CM | POA: Diagnosis not present

## 2020-04-14 NOTE — Progress Notes (Signed)
The patient comes in today with continued left lower extremity radicular symptoms.  We sent her for physical therapy to see if that would help but it does not really help at all and she feels like it is making her hips worse.  However this is not truly hip pain she is having.  She points more the sciatic region in the trochanteric region of the left side.  Previous x-rays of her hips and pelvis were normal.  On exam today she still walks with a slightly waddling gait I think this is more related to her back and her knees and her obesity.  Both hips move smoothly and fluidly.  There is no significant issues with her knees except for they are in valgus malalignment.  Previous lumbar spine films show significant and severe degenerative disc disease and malalignment.  At this point a MRI of the lumbar spine is warranted to rule out stenosis to the left side that may be causing his radicular symptoms.  The purpose of the MRI at this point is to help guide other treatment modalities since therapy is not working and making things worse for her.  All questions and concerns were answered and addressed.  We will see her back after the MRI of her lumbar spine.

## 2020-04-16 DIAGNOSIS — M1712 Unilateral primary osteoarthritis, left knee: Secondary | ICD-10-CM | POA: Diagnosis not present

## 2020-04-16 DIAGNOSIS — M25562 Pain in left knee: Secondary | ICD-10-CM | POA: Diagnosis not present

## 2020-04-21 DIAGNOSIS — M1711 Unilateral primary osteoarthritis, right knee: Secondary | ICD-10-CM | POA: Diagnosis not present

## 2020-04-21 DIAGNOSIS — M25561 Pain in right knee: Secondary | ICD-10-CM | POA: Diagnosis not present

## 2020-04-28 ENCOUNTER — Ambulatory Visit: Payer: Medicare Other | Admitting: Orthopaedic Surgery

## 2020-04-28 DIAGNOSIS — M1712 Unilateral primary osteoarthritis, left knee: Secondary | ICD-10-CM | POA: Diagnosis not present

## 2020-04-28 DIAGNOSIS — M25562 Pain in left knee: Secondary | ICD-10-CM | POA: Diagnosis not present

## 2020-04-29 DIAGNOSIS — M1711 Unilateral primary osteoarthritis, right knee: Secondary | ICD-10-CM | POA: Diagnosis not present

## 2020-04-29 DIAGNOSIS — M25561 Pain in right knee: Secondary | ICD-10-CM | POA: Diagnosis not present

## 2020-04-30 ENCOUNTER — Ambulatory Visit (INDEPENDENT_AMBULATORY_CARE_PROVIDER_SITE_OTHER): Payer: Medicare Other | Admitting: Internal Medicine

## 2020-04-30 ENCOUNTER — Encounter: Payer: Self-pay | Admitting: Internal Medicine

## 2020-04-30 ENCOUNTER — Other Ambulatory Visit: Payer: Self-pay

## 2020-04-30 ENCOUNTER — Ambulatory Visit
Admission: RE | Admit: 2020-04-30 | Discharge: 2020-04-30 | Disposition: A | Discharge status: Home / Self Care | Payer: Medicare Other | Source: Ambulatory Visit | Attending: Orthopaedic Surgery | Admitting: Orthopaedic Surgery

## 2020-04-30 VITALS — BP 114/60 | HR 90 | Ht 60.0 in | Wt 229.0 lb

## 2020-04-30 DIAGNOSIS — E7849 Other hyperlipidemia: Secondary | ICD-10-CM | POA: Diagnosis not present

## 2020-04-30 DIAGNOSIS — R931 Abnormal findings on diagnostic imaging of heart and coronary circulation: Secondary | ICD-10-CM | POA: Diagnosis not present

## 2020-04-30 DIAGNOSIS — T466X5A Adverse effect of antihyperlipidemic and antiarteriosclerotic drugs, initial encounter: Secondary | ICD-10-CM

## 2020-04-30 DIAGNOSIS — T466X5D Adverse effect of antihyperlipidemic and antiarteriosclerotic drugs, subsequent encounter: Secondary | ICD-10-CM

## 2020-04-30 DIAGNOSIS — M791 Myalgia, unspecified site: Secondary | ICD-10-CM | POA: Diagnosis not present

## 2020-04-30 DIAGNOSIS — M48061 Spinal stenosis, lumbar region without neurogenic claudication: Secondary | ICD-10-CM | POA: Diagnosis not present

## 2020-04-30 DIAGNOSIS — M4807 Spinal stenosis, lumbosacral region: Secondary | ICD-10-CM

## 2020-04-30 DIAGNOSIS — M545 Low back pain, unspecified: Secondary | ICD-10-CM | POA: Diagnosis not present

## 2020-04-30 NOTE — Progress Notes (Signed)
LIPID NOTE  Chief Complaint:  Follow-up dyslipidemia  Primary Care Physician: [name], MD  Primary Cardiologist:  [name], MD  HPI:  Tammy [name] is a 69 y.o. female who is being seen today for the evaluation of dyslipidemia at the request of [name], MD. Tammy [name] is a pleasant female who was kindly referred by Dr. Percival Spanish for additional management of dyslipidemia.  She is a patient of Dr. Brigitte Pulse, her primary care physician who has been trying to manage her significant dyslipidemia.  She has a history of high cholesterol with LDL around 250 untreated and a strong family history of heart disease including both parents (who incidentally are first cousins).  Other risk factors include type 2 diabetes, hypertension, COPD and sleep apnea (not on CPAP.  In 2016 she underwent coronary artery calcium scoring which demonstrated a high coronary calcium score of 873, 99th percentile for age and sex matched controls.  At this it does not seem that she has had anginal symptoms.  Stress test in 2016 which was low risk and no evidence of reversible ischemia.  Since then she has had 2 plain exercise treadmill tests (low sensitivity), however they were negative for ischemia.  Unfortunately, she has not been able to tolerate statins in the past.  She believes she had myalgias but is not clear on the side effects.  Subsequently she has been on Repatha for about a year and has had significant reduction in her cholesterol however LDL remains elevated at 149 as of March 2021.  Based on this persistent elevation in LDL cholesterol, Dr. Percival Spanish started her on ezetimibe.  She said she could not tolerate it but could not explain what the side effects were and discontinue the medication.  We had a long discussion about the importance of aggressive lipid-lowering and the fact that we may need to retry statins or consider some of the limited other options to lower cholesterol further.  She does  seem agreeable to this.  We also cussed genetic testing for familial hyperlipidemia which I suspect she has.  She does have a brother who had recently had bypass surgery.  She has no children, however.  After discussing cost she declined testing.  04/30/2020  Tammy [name] is seen today in follow-up of her dyslipidemia.  Fortunately she has been able to tolerate pravastatin 40 mg daily which was added to her Repatha.  The combination of both has had a significant lipid-lowering effect for her.  She had recent labs performed in September which showed total cholesterol 149, triglycerides 75, HDL 63 and LDL 71 which puts her essentially at target LDL of 70 or lower.  Overall she seems to be tolerating this well and feels good.  PMHx:  Past Medical History:  Diagnosis Date  . Agatston coronary artery calcium score greater than 400   . Diabetes mellitus ([place])    x 3 years  . DJD (degenerative joint disease)   . Encephalitis   . Fibromyalgia   . HTN (hypertension)   . Hyperlipidemia   . Sleep apnea    No CPAP    Past Surgical History:  Procedure Laterality Date  . APPENDECTOMY    . BREAST CYST EXCISION    . BREAST EXCISIONAL BIOPSY Left 2004  . KNEE ARTHROSCOPY      FAMHx:  Family History  Problem Relation Age of Onset  . CAD Mother 9  . CAD Brother 72  . Breast cancer Cousin  SOCHx:   reports that she quit smoking about 11 years ago. Her smoking use included cigarettes. She has a 30.00 pack-year smoking history. She has never used smokeless tobacco. She reports that she does not drink alcohol and does not use drugs.  ALLERGIES:  Allergies  Allergen Reactions  . Betadine [Povidone Iodine] Anaphylaxis  . Contrast Media [Iodinated Diagnostic Agents] Anaphylaxis  . Iodine Anaphylaxis  . Povidone-Iodine Anaphylaxis  . Shellfish Allergy Anaphylaxis  . Hydrocodone-Acetaminophen Nausea Only    ROS: Pertinent items noted in HPI and remainder of comprehensive ROS otherwise  negative.  HOME MEDS: Current Outpatient Medications on File Prior to Visit  Medication Sig Dispense Refill  . amLODipine (NORVASC) 10 MG tablet Take 10 mg by mouth daily.    Marland Kitchen amLODipine-valsartan (EXFORGE) 10-320 MG per tablet Take 1 tablet by mouth daily.     Marland Kitchen aspirin 81 MG tablet Take 81 mg by mouth daily.    . beta carotene w/minerals (OCUVITE) tablet Take 1 tablet by mouth daily.    . DULoxetine (CYMBALTA) 60 MG capsule Take 60 mg by mouth daily.    Marland Kitchen EPINEPHrine 0.3 mg/0.3 mL IJ SOAJ injection Inject 0.3 mg into the muscle as needed.    . Evolocumab (REPATHA SURECLICK) 937 MG/ML SOAJ Inject 1 Dose into the skin every 14 (fourteen) days.    . Exenatide (BYDUREON [place]) Inject into the skin once a week.    . fesoterodine (TOVIAZ) 4 MG TB24 tablet Take 4 mg by mouth daily.    Marland Kitchen gabapentin (NEURONTIN) 300 MG capsule Take 1 capsule (300 mg total) by mouth at bedtime. 30 capsule 1  . meloxicam (MOBIC) 15 MG tablet Take 15 mg by mouth 2 (two) times daily.    . metFORMIN (GLUCOPHAGE) 1000 MG tablet Take 1,000 mg by mouth 2 (two) times daily with a meal.    . valACYclovir HCl (VALTREX PO) valacyclovir    . valsartan-hydrochlorothiazide (DIOVAN-HCT) 320-25 MG tablet Take 1 tablet by mouth daily.    . phentermine (ADIPEX-P) 37.5 MG tablet Take 37.5 mg by mouth at bedtime.    . pravastatin (PRAVACHOL) 40 MG tablet Take 1 tablet (40 mg total) by mouth every evening. 30 tablet 2   No current facility-administered medications on file prior to visit.    LABS/IMAGING: No results found for this or any previous visit (from the past 48 hour(s)). MR Lumbar Spine w/o contrast  Result Date: 04/30/2020 CLINICAL DATA:  Low back pain that radiates to left leg for 2-3 years. EXAM: MRI LUMBAR SPINE WITHOUT CONTRAST TECHNIQUE: Multiplanar, multisequence MR imaging of the lumbar spine was performed. No intravenous contrast was administered. COMPARISON:  Radiographs 02/02/2020 FINDINGS: Segmentation: The  inferior-most fully formed vertebral body is labeled L5-S1. 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 Levocurvature. Otherwise, no substantial subluxation. Vertebrae: Degenerative/discogenic changes about the right L3-L4 disc. Otherwise, no focal marrow signal abnormality to suggest acute fracture, discitis/osteomyelitis, or suspicious bone lesion vertebral body heights are maintained. Conus medullaris and cauda equina: Conus extends to the L1-L2 level. Conus appears normal. Paraspinal and other soft tissues: Unremarkable. Disc levels: T10-T11 and T11-T12 are only imaged on sagittal sequences without evidence of significant canal or foraminal stenosis. T12-L1: There is a broad-based disc bulge with superimposed left subarticular disc protrusion. Mild canal and left subarticular recess stenosis. No significant foraminal stenosis. L1-L2: Broad-based disc bulge with superimposed left paracentral disc protrusion. Mild canal and left subarticular recess stenosis without significant foraminal stenosis. L2-L3: Broad-based disc bulge with  moderate bilateral facet hypertrophy and ligamentum flavum thickening. Small superimposed central disc protrusion. There is resulting mild canal stenosis and mild bilateral foraminal stenosis. L3-L4: Right eccentric broad-based disc bulge with severe right and moderate left facet hypertrophy. There is resulting moderate canal stenosis and right greater than left subarticular recess stenosis with possible impingement on the right. Moderate to severe right and moderate left foraminal stenosis. L4-L5: Grade 1 anterolisthesis of L4 on L5. Broad-based disc bulge and severe bilateral facet hypertrophy. There is resulting moderate to severe canal stenosis and bilateral subarticular recess narrowing. There is a left foraminal/extraforaminal disc herniation (see series 3, image 13) which appears to contact the exiting left L4 nerve root and results in moderate left foraminal stenosis.  Mild right foraminal stenosis. L5-S1: Small central disc protrusion. Severe bilateral facet hypertrophy. Mild bilateral foraminal stenosis. No significant canal stenosis. IMPRESSION: 1. Multilevel canal stenosis, moderate to severe at L4-L5, moderate at L3-L4, and mild at L2-L3. 2. Right eccentric degenerative disc disease at L3-L4 with moderate to severe right and moderate left foraminal stenosis. There is right greater than left subarticular recess stenosis with possible impingement. 3. At L4-L5 there is a left foraminal/extraforaminal disc herniation which appears to contact the exiting left L4 nerve root with moderate left foraminal stenosis. 4. At T12-L1 and L1-L2 there are left subarticular disc protrusions which appear to contact descending nerve roots without evidence of impingement. Electronically Signed   By: Margaretha Sheffield MD   On: 04/30/2020 08:20    LIPID PANEL:    Component Value Date/Time   CHOL 285 (H) 04/23/2015 1144   TRIG 140 04/23/2015 1144   HDL 50 04/23/2015 1144   CHOLHDL 5.7 (H) 04/23/2015 1144   LDLCALC 207 (H) 04/23/2015 1144    WEIGHTS: Wt Readings from Last 3 Encounters:  04/30/20 229 lb (103.9 kg)  02/02/20 220 lb (99.8 kg)  01/29/20 220 lb (99.8 kg)    VITALS: BP 114/60   Pulse 90   Ht 5' (1.524 m)   Wt 229 lb (103.9 kg)   SpO2 98%   BMI 44.72 kg/m   EXAM: Deferred  EKG: Deferred  ASSESSMENT: 1. Probable familial hyperlipidemia -Dutch score of 6 2. Multivessel coronary artery calcium-calcium score 873, 99th percentile 3. COPD 4. Type 2 diabetes 5. Hypertension 6. Strong family history of coronary disease in both parents 33. Statin myalgias, Zetia intolerant  PLAN: 1.   Tammy [name] is doing well on pravastatin although could not tolerate other statins or ezetimibe.  She is also on Repatha with a marked improvement in her lipids.  She remains asymptomatic from a cardiac standpoint.  She is now at goal therapy.  We will plan to continue  current medications.  Her PCP checks her lipids every 6 months and I recommend follow-up with me after lipid profile in 1 year.  She will continue to follow with her primary cardiologist.  Pixie Casino, MD, FACC, [place] Director of the [facility]  [facility] Diplomate of the American Board of Clinical Lipidology Attending Cardiologist  Direct Dial: [phone]  Fax: [phone]  Website:  [email or web address] Daniele Yankowski 04/30/2020, 11:06 AM

## 2020-04-30 NOTE — Patient Instructions (Signed)
Medication Instructions:  Your physician recommends that you continue on your current medications as directed. Please refer to the Current Medication list given to you today.  *If you need a refill on your cardiac medications before your next appointment, please call your pharmacy*   Lab Work: FASTING lipid panel before your next visit  If you have labs (blood work) drawn today and your tests are completely normal, you will receive your results only by: Marland Kitchen MyChart Message (if you have MyChart) OR . A paper copy in the mail If you have any lab test that is abnormal or we need to change your treatment, we will call you to review the results.   Testing/Procedures: NONE   Follow-Up: At [facility], you and your health needs are our priority.  As part of our continuing mission to provide you with exceptional heart care, we have created designated Provider Care Teams.  These Care Teams include your primary Cardiologist (physician) and Advanced Practice Providers (APPs -  Physician Assistants and Nurse Practitioners) who all work together to provide you with the care you need, when you need it.  We recommend signing up for the patient portal called "MyChart".  Sign up information is provided on this After Visit Summary.  MyChart is used to connect with patients for Virtual Visits (Telemedicine).  Patients are able to view lab/test results, encounter notes, upcoming appointments, etc.  Non-urgent messages can be sent to your provider as well.   To learn more about what you can do with MyChart, go to [email or web address].    Your next appointment:   12 month(s) - lipid clinic  The format for your next appointment:   In Person  Provider:   K. Mali Hilty, MD   Other Instructions

## 2020-05-05 ENCOUNTER — Other Ambulatory Visit: Payer: Medicare Other

## 2020-05-10 ENCOUNTER — Ambulatory Visit (INDEPENDENT_AMBULATORY_CARE_PROVIDER_SITE_OTHER): Payer: Medicare Other | Admitting: Orthopaedic Surgery

## 2020-05-10 ENCOUNTER — Encounter: Payer: Self-pay | Admitting: Orthopaedic Surgery

## 2020-05-10 ENCOUNTER — Other Ambulatory Visit: Payer: Self-pay

## 2020-05-10 ENCOUNTER — Telehealth: Payer: Self-pay

## 2020-05-10 DIAGNOSIS — G8929 Other chronic pain: Secondary | ICD-10-CM | POA: Diagnosis not present

## 2020-05-10 DIAGNOSIS — M5442 Lumbago with sciatica, left side: Secondary | ICD-10-CM | POA: Diagnosis not present

## 2020-05-10 DIAGNOSIS — M4807 Spinal stenosis, lumbosacral region: Secondary | ICD-10-CM | POA: Diagnosis not present

## 2020-05-10 NOTE — Telephone Encounter (Signed)
   [facility] Pre-operative Risk Assessment    Dr. [name]  Request for surgical clearance:  1. What type of surgery is being performed? Left wrist volar cyst excision with left thumb cmc injection   2. When is this surgery scheduled? 05/19/2020   3. What type of clearance is required (medical clearance vs. Pharmacy clearance to hold med vs. Both)? Medical  4. Are there any medications that need to be held prior to surgery and how long? Aspirin   5. Practice name and name of physician performing surgery? Dr. Charlotte Crumb, [facility] [place]   6. What is the office phone number? [phone]   7.   What is the office fax number? [phone], Loleta Chance  8.   Anesthesia type (None, local, MAC, general) ? Axillary block/MAC   Tammy [name] 05/10/2020, 4:23 PM  _________________________________________________________________   (provider comments below)

## 2020-05-10 NOTE — Progress Notes (Signed)
Patient is here for follow-up after having a MRI of her lumbar spine due to left-sided radicular symptoms.  She had low back pain to the left side that radiates into the sciatic region and down the left side of her leg.  This is been going on for a while.  She was sent to me to evaluate her hips and her left hip and right hip were both normal.  Her signs and symptoms were consistent with a radicular issue.  The MRI of her lumbar spine is reviewed with her.  It does show that at L4/L5 there is a disc herniation.  This herniation does seem to go into the foramina and does appear to contact the L4 nerve.  This seems to correlate with her clinical exam as well.  There is quite severe stenosis at L3-L4, but most of the stenosis is also at L4-L5.  I did show her a model of the lumbar spine to try to correlate this with her MRI findings.  I would like to send her to outpatient physical therapy to work on McKenzie exercises and traction with the lumbar spine.  I would also like to send her to Dr. Ernestina Patches for hopefully a diagnostic and therapeutic injection to the left sided L4.  She agrees with this treatment plan.  I will see her back in about 6 weeks to see how she is doing overall.

## 2020-05-17 ENCOUNTER — Encounter (HOSPITAL_COMMUNITY): Payer: Self-pay

## 2020-05-17 ENCOUNTER — Emergency Department (HOSPITAL_COMMUNITY): Payer: Medicare Other

## 2020-05-17 ENCOUNTER — Emergency Department (HOSPITAL_COMMUNITY)
Admission: EM | Admit: 2020-05-17 | Discharge: 2020-05-18 | Disposition: A | Discharge status: Home / Self Care | Payer: Medicare Other | Attending: Emergency Medicine | Admitting: Emergency Medicine

## 2020-05-17 ENCOUNTER — Other Ambulatory Visit: Payer: Self-pay

## 2020-05-17 DIAGNOSIS — R002 Palpitations: Secondary | ICD-10-CM | POA: Diagnosis not present

## 2020-05-17 DIAGNOSIS — I959 Hypotension, unspecified: Secondary | ICD-10-CM | POA: Diagnosis not present

## 2020-05-17 DIAGNOSIS — I10 Essential (primary) hypertension: Secondary | ICD-10-CM | POA: Diagnosis not present

## 2020-05-17 DIAGNOSIS — R079 Chest pain, unspecified: Secondary | ICD-10-CM | POA: Insufficient documentation

## 2020-05-17 DIAGNOSIS — Z5321 Procedure and treatment not carried out due to patient leaving prior to being seen by health care provider: Secondary | ICD-10-CM | POA: Diagnosis not present

## 2020-05-17 DIAGNOSIS — I491 Atrial premature depolarization: Secondary | ICD-10-CM | POA: Diagnosis not present

## 2020-05-17 DIAGNOSIS — R0902 Hypoxemia: Secondary | ICD-10-CM | POA: Diagnosis not present

## 2020-05-17 LAB — BASIC METABOLIC PANEL
Anion gap: 10 (ref 5–15)
BUN: 21 mg/dL (ref 8–23)
CO2: 28 mmol/L (ref 22–32)
Calcium: 9.4 mg/dL (ref 8.9–10.3)
Chloride: 101 mmol/L (ref 98–111)
Creatinine, Ser: 1.02 mg/dL — ABNORMAL HIGH (ref 0.44–1.00)
GFR, Estimated: 60 mL/min — ABNORMAL LOW (ref >60)
Glucose, Bld: 141 mg/dL — ABNORMAL HIGH (ref 70–99)
Potassium: 3.7 mmol/L (ref 3.5–5.1)
Sodium: 139 mmol/L (ref 135–145)

## 2020-05-17 LAB — CBC
HCT: 39.8 % (ref 36.0–46.0)
Hemoglobin: 12.8 g/dL (ref 12.0–15.0)
MCH: 30.8 pg (ref 26.0–34.0)
MCHC: 32.2 g/dL (ref 30.0–36.0)
MCV: 95.7 fL (ref 80.0–100.0)
Platelets: 317 10*3/uL (ref 150–400)
RBC: 4.16 MIL/uL (ref 3.87–5.11)
RDW: 13.2 % (ref 11.5–15.5)
WBC: 9.7 10*3/uL (ref 4.0–10.5)
nRBC: 0 % (ref 0.0–0.2)

## 2020-05-17 LAB — TROPONIN I (HIGH SENSITIVITY)
Troponin I (High Sensitivity): 3 ng/L (ref <18)
Troponin I (High Sensitivity): 3 ng/L

## 2020-05-17 NOTE — ED Triage Notes (Signed)
Pt c/o acute onset Left side CP that has now subsided

## 2020-05-17 NOTE — ED Triage Notes (Signed)
Pt BIB [facility] with heart palpitations. Pt was driving when this started, pulled over and called [phone] Pt is scheduled for a surgery this week, instructed not to take any ASA   BP 114/70 HR 94 100% RA   18g LAC

## 2020-05-18 NOTE — ED Notes (Signed)
Lwbs. 

## 2020-05-19 DIAGNOSIS — D1722 Benign lipomatous neoplasm of skin and subcutaneous tissue of left arm: Secondary | ICD-10-CM | POA: Diagnosis not present

## 2020-05-19 DIAGNOSIS — M67432 Ganglion, left wrist: Secondary | ICD-10-CM | POA: Diagnosis not present

## 2020-05-19 DIAGNOSIS — M1812 Unilateral primary osteoarthritis of first carpometacarpal joint, left hand: Secondary | ICD-10-CM | POA: Diagnosis not present

## 2020-05-19 DIAGNOSIS — G8918 Other acute postprocedural pain: Secondary | ICD-10-CM | POA: Diagnosis not present

## 2020-05-21 DIAGNOSIS — L821 Other seborrheic keratosis: Secondary | ICD-10-CM | POA: Diagnosis not present

## 2020-05-21 DIAGNOSIS — D485 Neoplasm of uncertain behavior of skin: Secondary | ICD-10-CM | POA: Diagnosis not present

## 2020-05-21 DIAGNOSIS — D1801 Hemangioma of skin and subcutaneous tissue: Secondary | ICD-10-CM | POA: Diagnosis not present

## 2020-05-21 DIAGNOSIS — L812 Freckles: Secondary | ICD-10-CM | POA: Diagnosis not present

## 2020-05-21 DIAGNOSIS — L570 Actinic keratosis: Secondary | ICD-10-CM | POA: Diagnosis not present

## 2020-05-21 DIAGNOSIS — D2261 Melanocytic nevi of right upper limb, including shoulder: Secondary | ICD-10-CM | POA: Diagnosis not present

## 2020-05-21 DIAGNOSIS — L300 Nummular dermatitis: Secondary | ICD-10-CM | POA: Diagnosis not present

## 2020-05-24 DIAGNOSIS — M5459 Other low back pain: Secondary | ICD-10-CM | POA: Diagnosis not present

## 2020-05-26 DIAGNOSIS — M5459 Other low back pain: Secondary | ICD-10-CM | POA: Diagnosis not present

## 2020-06-01 DIAGNOSIS — M5459 Other low back pain: Secondary | ICD-10-CM | POA: Diagnosis not present

## 2020-06-03 DIAGNOSIS — M5459 Other low back pain: Secondary | ICD-10-CM | POA: Diagnosis not present

## 2020-06-07 DIAGNOSIS — M5459 Other low back pain: Secondary | ICD-10-CM | POA: Diagnosis not present

## 2020-06-14 ENCOUNTER — Other Ambulatory Visit: Payer: Self-pay

## 2020-06-14 ENCOUNTER — Ambulatory Visit: Payer: Self-pay

## 2020-06-14 ENCOUNTER — Encounter: Payer: Self-pay | Admitting: Physical Medicine and Rehabilitation

## 2020-06-14 ENCOUNTER — Ambulatory Visit (INDEPENDENT_AMBULATORY_CARE_PROVIDER_SITE_OTHER): Payer: Medicare Other | Admitting: Physical Medicine and Rehabilitation

## 2020-06-14 DIAGNOSIS — M4316 Spondylolisthesis, lumbar region: Secondary | ICD-10-CM

## 2020-06-14 DIAGNOSIS — M48062 Spinal stenosis, lumbar region with neurogenic claudication: Secondary | ICD-10-CM | POA: Diagnosis not present

## 2020-06-14 DIAGNOSIS — M5416 Radiculopathy, lumbar region: Secondary | ICD-10-CM

## 2020-06-14 MED ORDER — METHYLPREDNISOLONE ACETATE 80 MG/ML IJ SUSP
80.0000 mg | Freq: Once | INTRAMUSCULAR | Status: AC
  Administered 2020-06-14: 80 mg

## 2020-06-14 NOTE — Procedures (Deleted)
Lumbosacral Transforaminal Epidural Steroid Injection - Sub-Pedicular Approach with Fluoroscopic Guidance  Patient: Tammy [name]      Date of Birth: [birth year] MRN: [id number] PCP: [name], MD      Visit Date: 06/14/2020   Universal Protocol:    Date/Time: 06/14/2020  Consent Given By: the patient  Position: PRONE  Additional Comments: Vital signs were monitored before and after the procedure. Patient was prepped and draped in the usual sterile fashion. The correct patient, procedure, and site was verified.   Injection Procedure Details:   Procedure diagnoses:  1. Lumbar radiculopathy   2. Spinal stenosis of lumbar region with neurogenic claudication   3. Spondylolisthesis of lumbar region      Meds Administered:  Meds ordered this encounter  Medications  . methylPREDNISolone acetate (DEPO-MEDROL) injection 80 mg    Laterality: Left  Location/Site:  L4-L5  Needle:5.0 in., 22 ga.  Short bevel or Quincke spinal needle  Needle Placement: Transforaminal  Findings:    -Comments: Excellent flow of contrast along the nerve, nerve root and into the epidural space.  Procedure Details: After squaring off the end-plates to get a true AP view, the C-arm was positioned so that an oblique view of the foramen as noted above was visualized. The target area is just inferior to the "nose of the scotty dog" or sub pedicular. The soft tissues overlying this structure were infiltrated with 2-3 ml. of 1% Lidocaine without Epinephrine.  The spinal needle was inserted toward the target using a "trajectory" view along the fluoroscope beam.  Under AP and lateral visualization, the needle was advanced so it did not puncture dura and was located close the 6 O'Clock position of the pedical in AP tracterory. Biplanar projections were used to confirm position. Aspiration was confirmed to be negative for CSF and/or blood. A 1 ml. volume of Omniscan (due to Contrast allergy) was  injected and flow of contrast was noted at each level. Radiographs were obtained for documentation purposes.   After attaining the desired flow of contrast documented above, a 0.5 to 1.0 ml test dose of 0.25% Marcaine was injected into each respective transforaminal space.  The patient was observed for 90 seconds post injection.  After no sensory deficits were reported, and normal lower extremity motor function was noted,   the above injectate was administered so that equal amounts of the injectate were placed at each foramen (level) into the transforaminal epidural space.   Additional Comments:  The patient tolerated the procedure well Dressing: 2 x 2 sterile gauze and Band-Aid    Post-procedure details: Patient was observed during the procedure. Post-procedure instructions were reviewed.  Patient left the clinic in stable condition.

## 2020-06-14 NOTE — Progress Notes (Signed)
Numeric Pain Rating Scale and Functional Assessment Average Pain 6   In the last MONTH (on 0-10 scale) has pain interfered with the following?  1. General activity like being  able to carry out your everyday physical activities such as walking, climbing stairs, carrying groceries, or moving a chair?  Rating(6)

## 2020-06-14 NOTE — Progress Notes (Signed)
Tammy [name] - 68 y.o. female MRN [id number]  Date of birth: Oct 24, 1950  Office Visit Note: Visit Date: 06/14/2020 PCP: [name], MD Referred by: [name], MD  Subjective: Chief Complaint  Patient presents with  . Lower Back - Pain  . Left Hip - Pain  . Left Leg - Pain   HPI: Tammy [name] is a 69 y.o. female who comes in today For evaluation management of lower back pain and left hip and leg pain.  Pain is consistent with an L5 radiculopathy radiculitis.  She has tried and failed conservative care with her primary physician Dr. Manuella Ghazi as well as Dr. Ninfa Linden.  MRI of the lumbar spine was obtained and this is reviewed with the patient and reviewed below.  She does have findings at L4-5 consistent with severe arthritis and some stenosis.  She has a pretty significant allergy to contrast media and iodine.  We discussed this at length and for an injection we can use on the scan or basically an MRI contrast.  We typically do not do that with an interlaminar approach but with the transforaminal approach that can be done safely.  She has had no focal weakness no bowel or bladder changes.  Review of Systems  Musculoskeletal: Positive for back pain.  All other systems reviewed and are negative.  Otherwise per HPI.  Assessment & Plan: Visit Diagnoses:    ICD-10-CM   1. Lumbar radiculopathy  M54.16 methylPREDNISolone acetate (DEPO-MEDROL) injection 80 mg    CANCELED: XR C-ARM NO REPORT    CANCELED: Epidural Steroid injection  2. Spinal stenosis of lumbar region with neurogenic claudication  M48.062 methylPREDNISolone acetate (DEPO-MEDROL) injection 80 mg    CANCELED: XR C-ARM NO REPORT    CANCELED: Epidural Steroid injection  3. Spondylolisthesis of lumbar region  M43.16 methylPREDNISolone acetate (DEPO-MEDROL) injection 80 mg    CANCELED: XR C-ARM NO REPORT    CANCELED: Epidural Steroid injection     Plan: No additional findings.   Meds & Orders:  Meds ordered  this encounter  Medications  . methylPREDNISolone acetate (DEPO-MEDROL) injection 80 mg   No orders of the defined types were placed in this encounter.   Follow-up: Return for Left L5 transforaminal epidural steroid injection.   Procedures: No procedures performed  No notes on file   Clinical History: MRI LUMBAR SPINE WITHOUT CONTRAST  TECHNIQUE: Multiplanar, multisequence MR imaging of the lumbar spine was performed. No intravenous contrast was administered.  COMPARISON:  Radiographs 02/02/2020  FINDINGS: Segmentation: The inferior-most fully formed vertebral body is labeled L5-S1.  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 Levocurvature. Otherwise, no substantial subluxation.  Vertebrae: Degenerative/discogenic changes about the right L3-L4 disc. Otherwise, no focal marrow signal abnormality to suggest acute fracture, discitis/osteomyelitis, or suspicious bone lesion vertebral body heights are maintained.  Conus medullaris and cauda equina: Conus extends to the L1-L2 level. Conus appears normal.  Paraspinal and other soft tissues: Unremarkable.  Disc levels:  T10-T11 and T11-T12 are only imaged on sagittal sequences without evidence of significant canal or foraminal stenosis.  T12-L1: There is a broad-based disc bulge with superimposed left subarticular disc protrusion. Mild canal and left subarticular recess stenosis. No significant foraminal stenosis.  L1-L2: Broad-based disc bulge with superimposed left paracentral disc protrusion. Mild canal and left subarticular recess stenosis without significant foraminal stenosis.  L2-L3: Broad-based disc bulge with moderate bilateral facet hypertrophy and ligamentum flavum thickening. Small superimposed central disc protrusion. There is resulting mild canal stenosis and  mild bilateral foraminal stenosis.  L3-L4: Right eccentric broad-based disc bulge with severe right and moderate left facet  hypertrophy. There is resulting moderate canal stenosis and right greater than left subarticular recess stenosis with possible impingement on the right. Moderate to severe right and moderate left foraminal stenosis.  L4-L5: Grade 1 anterolisthesis of L4 on L5. Broad-based disc bulge and severe bilateral facet hypertrophy. There is resulting moderate to severe canal stenosis and bilateral subarticular recess narrowing. There is a left foraminal/extraforaminal disc herniation (see series 3, image 13) which appears to contact the exiting left L4 nerve root and results in moderate left foraminal stenosis. Mild right foraminal stenosis.  L5-S1: Small central disc protrusion. Severe bilateral facet hypertrophy. Mild bilateral foraminal stenosis. No significant canal stenosis.  IMPRESSION: 1. Multilevel canal stenosis, moderate to severe at L4-L5, moderate at L3-L4, and mild at L2-L3. 2. Right eccentric degenerative disc disease at L3-L4 with moderate to severe right and moderate left foraminal stenosis. There is right greater than left subarticular recess stenosis with possible impingement. 3. At L4-L5 there is a left foraminal/extraforaminal disc herniation which appears to contact the exiting left L4 nerve root with moderate left foraminal stenosis. 4. At T12-L1 and L1-L2 there are left subarticular disc protrusions which appear to contact descending nerve roots without evidence of impingement.   Electronically Signed   By: Margaretha Sheffield MD   On: 04/30/2020 08:20   She reports that she quit smoking about 11 years ago. Her smoking use included cigarettes. She has a 30.00 pack-year smoking history. She has never used smokeless tobacco. No results for input(s): HGBA1C, LABURIC in the last 8760 hours.  Objective:  VS:  HT:    WT:   BMI:     BP:   HR: bpm  TEMP: ( )  RESP:  Physical Exam Constitutional:      General: She is not in acute distress.    Appearance: Normal  appearance. She is not ill-appearing.  HENT:     Head: Normocephalic and atraumatic.     Right Ear: External ear normal.     Left Ear: External ear normal.  Eyes:     Extraocular Movements: Extraocular movements intact.  Cardiovascular:     Rate and Rhythm: Normal rate.     Pulses: Normal pulses.  Musculoskeletal:     Right lower leg: No edema.     Left lower leg: No edema.     Comments: Patient has good distal strength with no pain over the greater trochanters.  No clonus or focal weakness.  Skin:    Findings: No erythema, lesion or rash.  Neurological:     General: No focal deficit present.     Mental Status: She is alert and oriented to person, place, and time.     Sensory: No sensory deficit.     Motor: No weakness or abnormal muscle tone.     Coordination: Coordination normal.  Psychiatric:        Mood and Affect: Mood normal.        Behavior: Behavior normal.     Ortho Exam  Imaging: No results found.  Past Medical/Family/Surgical/Social History: Medications & Allergies reviewed per EMR, new medications updated. Patient Active Problem List   Diagnosis Date Noted  . Chronic venous insufficiency 12/30/2018  . Varicose veins of both lower extremities with inflammation 12/30/2018  . DJD (degenerative joint disease) 12/30/2018  . Snoring 11/27/2018  . Daytime sleepiness 11/27/2018  . Excessive sleepiness 11/27/2018  . Educated about  COVID-19 virus infection 11/27/2018  . SOB (shortness of breath) 11/27/2018  . Hyperlipidemia 05/20/2015  . Knee pain 06/06/2012   Past Medical History:  Diagnosis Date  . Agatston coronary artery calcium score greater than 400   . Diabetes mellitus ([place])    x 3 years  . DJD (degenerative joint disease)   . Encephalitis   . Fibromyalgia   . HTN (hypertension)   . Hyperlipidemia   . Sleep apnea    No CPAP   Family History  Problem Relation Age of Onset  . CAD Mother 92  . CAD Brother 63  . Breast cancer Cousin    Past  Surgical History:  Procedure Laterality Date  . APPENDECTOMY    . BREAST CYST EXCISION    . BREAST EXCISIONAL BIOPSY Left 2004  . KNEE ARTHROSCOPY     Social History   Occupational History  . Not on file  Tobacco Use  . Smoking status: Former Smoker    Packs/day: 1.00    Years: 30.00    Pack years: 30.00    Types: Cigarettes    Quit date: 11/01/2008    Years since quitting: 11.6  . Smokeless tobacco: Never Used  Vaping Use  . Vaping Use: Never used  Substance and Sexual Activity  . Alcohol use: No    Alcohol/week: 0.0 standard drinks  . Drug use: No  . Sexual activity: Not on file

## 2020-06-15 ENCOUNTER — Encounter: Payer: Self-pay | Admitting: Physical Medicine and Rehabilitation

## 2020-06-16 ENCOUNTER — Emergency Department: Payer: Medicare Other

## 2020-06-16 ENCOUNTER — Encounter: Payer: Self-pay | Admitting: Emergency Medicine

## 2020-06-16 ENCOUNTER — Other Ambulatory Visit: Payer: Self-pay

## 2020-06-16 ENCOUNTER — Inpatient Hospital Stay
Admission: EM | Admit: 2020-06-16 | Discharge: 2020-06-19 | DRG: 309 | Disposition: A | Discharge status: Home / Self Care | Payer: Medicare Other | Attending: Internal Medicine | Admitting: Internal Medicine

## 2020-06-16 DIAGNOSIS — E1140 Type 2 diabetes mellitus with diabetic neuropathy, unspecified: Secondary | ICD-10-CM | POA: Diagnosis not present

## 2020-06-16 DIAGNOSIS — R079 Chest pain, unspecified: Secondary | ICD-10-CM

## 2020-06-16 DIAGNOSIS — Z6841 Body Mass Index (BMI) 40.0 and over, adult: Secondary | ICD-10-CM

## 2020-06-16 DIAGNOSIS — G4730 Sleep apnea, unspecified: Secondary | ICD-10-CM | POA: Diagnosis present

## 2020-06-16 DIAGNOSIS — I5032 Chronic diastolic (congestive) heart failure: Secondary | ICD-10-CM | POA: Diagnosis not present

## 2020-06-16 DIAGNOSIS — Z803 Family history of malignant neoplasm of breast: Secondary | ICD-10-CM | POA: Diagnosis not present

## 2020-06-16 DIAGNOSIS — I4891 Unspecified atrial fibrillation: Secondary | ICD-10-CM | POA: Diagnosis not present

## 2020-06-16 DIAGNOSIS — M797 Fibromyalgia: Secondary | ICD-10-CM | POA: Diagnosis present

## 2020-06-16 DIAGNOSIS — Z8249 Family history of ischemic heart disease and other diseases of the circulatory system: Secondary | ICD-10-CM | POA: Diagnosis not present

## 2020-06-16 DIAGNOSIS — E119 Type 2 diabetes mellitus without complications: Secondary | ICD-10-CM

## 2020-06-16 DIAGNOSIS — Z91013 Allergy to seafood: Secondary | ICD-10-CM

## 2020-06-16 DIAGNOSIS — Z888 Allergy status to other drugs, medicaments and biological substances status: Secondary | ICD-10-CM | POA: Diagnosis not present

## 2020-06-16 DIAGNOSIS — M1990 Unspecified osteoarthritis, unspecified site: Secondary | ICD-10-CM | POA: Diagnosis present

## 2020-06-16 DIAGNOSIS — Z7984 Long term (current) use of oral hypoglycemic drugs: Secondary | ICD-10-CM

## 2020-06-16 DIAGNOSIS — Z7982 Long term (current) use of aspirin: Secondary | ICD-10-CM

## 2020-06-16 DIAGNOSIS — Z79899 Other long term (current) drug therapy: Secondary | ICD-10-CM

## 2020-06-16 DIAGNOSIS — I10 Essential (primary) hypertension: Secondary | ICD-10-CM | POA: Diagnosis present

## 2020-06-16 DIAGNOSIS — Z20822 Contact with and (suspected) exposure to covid-19: Secondary | ICD-10-CM | POA: Diagnosis present

## 2020-06-16 DIAGNOSIS — R0602 Shortness of breath: Secondary | ICD-10-CM | POA: Diagnosis not present

## 2020-06-16 DIAGNOSIS — D6869 Other thrombophilia: Secondary | ICD-10-CM | POA: Diagnosis not present

## 2020-06-16 DIAGNOSIS — E7849 Other hyperlipidemia: Secondary | ICD-10-CM | POA: Diagnosis present

## 2020-06-16 DIAGNOSIS — R0789 Other chest pain: Secondary | ICD-10-CM | POA: Diagnosis present

## 2020-06-16 DIAGNOSIS — F32A Depression, unspecified: Secondary | ICD-10-CM | POA: Diagnosis not present

## 2020-06-16 DIAGNOSIS — E782 Mixed hyperlipidemia: Secondary | ICD-10-CM | POA: Diagnosis not present

## 2020-06-16 DIAGNOSIS — D6859 Other primary thrombophilia: Secondary | ICD-10-CM | POA: Diagnosis present

## 2020-06-16 DIAGNOSIS — Z9109 Other allergy status, other than to drugs and biological substances: Secondary | ICD-10-CM

## 2020-06-16 DIAGNOSIS — Z91041 Radiographic dye allergy status: Secondary | ICD-10-CM

## 2020-06-16 DIAGNOSIS — Z886 Allergy status to analgesic agent status: Secondary | ICD-10-CM

## 2020-06-16 DIAGNOSIS — R0600 Dyspnea, unspecified: Secondary | ICD-10-CM | POA: Diagnosis not present

## 2020-06-16 DIAGNOSIS — Z87891 Personal history of nicotine dependence: Secondary | ICD-10-CM

## 2020-06-16 DIAGNOSIS — J90 Pleural effusion, not elsewhere classified: Secondary | ICD-10-CM | POA: Diagnosis not present

## 2020-06-16 DIAGNOSIS — M5459 Other low back pain: Secondary | ICD-10-CM | POA: Diagnosis not present

## 2020-06-16 LAB — CBC WITH DIFFERENTIAL/PLATELET
Abs Immature Granulocytes: 0.08 10*3/uL — ABNORMAL HIGH (ref 0.00–0.07)
Basophils Absolute: 0.1 10*3/uL (ref 0.0–0.1)
Basophils Relative: 1 %
Eosinophils Absolute: 0.5 10*3/uL (ref 0.0–0.5)
Eosinophils Relative: 3 %
HCT: 38.9 % (ref 36.0–46.0)
Hemoglobin: 12.8 g/dL (ref 12.0–15.0)
Immature Granulocytes: 1 %
Lymphocytes Relative: 23 %
Lymphs Abs: 3.9 10*3/uL (ref 0.7–4.0)
MCH: 30.9 pg (ref 26.0–34.0)
MCHC: 32.9 g/dL (ref 30.0–36.0)
MCV: 94 fL (ref 80.0–100.0)
Monocytes Absolute: 1.3 10*3/uL — ABNORMAL HIGH (ref 0.1–1.0)
Monocytes Relative: 8 %
Neutro Abs: 10.8 10*3/uL — ABNORMAL HIGH (ref 1.7–7.7)
Neutrophils Relative %: 64 %
Platelets: 321 10*3/uL (ref 150–400)
RBC: 4.14 MIL/uL (ref 3.87–5.11)
RDW: 13.8 % (ref 11.5–15.5)
WBC: 16.7 10*3/uL — ABNORMAL HIGH (ref 4.0–10.5)
nRBC: 0 % (ref 0.0–0.2)

## 2020-06-16 LAB — COMPREHENSIVE METABOLIC PANEL
ALT: 14 U/L (ref 0–44)
AST: 17 U/L (ref 15–41)
Albumin: 4 g/dL (ref 3.5–5.0)
Alkaline Phosphatase: 63 U/L (ref 38–126)
Anion gap: 11 (ref 5–15)
BUN: 28 mg/dL — ABNORMAL HIGH (ref 8–23)
CO2: 26 mmol/L (ref 22–32)
Calcium: 9.2 mg/dL (ref 8.9–10.3)
Chloride: 101 mmol/L (ref 98–111)
Creatinine, Ser: 1.02 mg/dL — ABNORMAL HIGH (ref 0.44–1.00)
GFR, Estimated: 60 mL/min — ABNORMAL LOW (ref >60)
Glucose, Bld: 113 mg/dL — ABNORMAL HIGH (ref 70–99)
Potassium: 3.5 mmol/L (ref 3.5–5.1)
Sodium: 138 mmol/L (ref 135–145)
Total Bilirubin: 1 mg/dL (ref 0.3–1.2)
Total Protein: 7.4 g/dL (ref 6.5–8.1)

## 2020-06-16 LAB — RESP PANEL BY RT-PCR (FLU A&B, COVID) ARPGX2
Influenza A by PCR: NEGATIVE
Influenza B by PCR: NEGATIVE
SARS Coronavirus 2 by RT PCR: NEGATIVE

## 2020-06-16 LAB — BRAIN NATRIURETIC PEPTIDE: B Natriuretic Peptide: 263.3 pg/mL — ABNORMAL HIGH (ref 0.0–100.0)

## 2020-06-16 LAB — TSH: TSH: 3.066 u[IU]/mL (ref 0.350–4.500)

## 2020-06-16 LAB — MAGNESIUM: Magnesium: 1.9 mg/dL (ref 1.7–2.4)

## 2020-06-16 LAB — TROPONIN I (HIGH SENSITIVITY): Troponin I (High Sensitivity): 5 ng/L (ref <18)

## 2020-06-16 MED ORDER — FLUOROURACIL 5 % EX CREA: TOPICAL_CREAM | Freq: Two times a day (BID) | CUTANEOUS | Status: DC

## 2020-06-16 MED ORDER — ASPIRIN EC 81 MG PO TBEC: 81.0000 mg | DELAYED_RELEASE_TABLET | Freq: Every day | ORAL | Status: DC

## 2020-06-16 MED ORDER — FESOTERODINE FUMARATE ER 4 MG PO TB24
4.0000 mg | ORAL_TABLET | Freq: Every day | ORAL | Status: DC
  Administered 2020-06-17 – 2020-06-19 (×3): 4 mg via ORAL
  Filled 2020-06-16 (×3): qty 1

## 2020-06-16 MED ORDER — PHENTERMINE HCL 37.5 MG PO TABS: 37.5000 mg | ORAL_TABLET | Freq: Every day | ORAL | Status: DC

## 2020-06-16 MED ORDER — HYDROCODONE-ACETAMINOPHEN 5-325 MG PO TABS
1.0000 | ORAL_TABLET | Freq: Four times a day (QID) | ORAL | Status: DC | PRN
  Administered 2020-06-17: 1 via ORAL
  Filled 2020-06-16: qty 1

## 2020-06-16 MED ORDER — OCUVITE-LUTEIN PO CAPS
1.0000 | ORAL_CAPSULE | Freq: Every day | ORAL | Status: DC
  Administered 2020-06-17 – 2020-06-19 (×3): 1 via ORAL
  Filled 2020-06-16 (×3): qty 1

## 2020-06-16 MED ORDER — INSULIN ASPART 100 UNIT/ML ~~LOC~~ SOLN
0.0000 [IU] | Freq: Three times a day (TID) | SUBCUTANEOUS | Status: DC
  Administered 2020-06-17: 3 [IU] via SUBCUTANEOUS
  Filled 2020-06-16: qty 1

## 2020-06-16 MED ORDER — DULOXETINE HCL 30 MG PO CPEP
60.0000 mg | ORAL_CAPSULE | Freq: Every day | ORAL | Status: DC
  Administered 2020-06-17 – 2020-06-19 (×3): 60 mg via ORAL
  Filled 2020-06-16: qty 2
  Filled 2020-06-16: qty 1
  Filled 2020-06-16: qty 2

## 2020-06-16 MED ORDER — DILTIAZEM LOAD VIA INFUSION
10.0000 mg | Freq: Once | INTRAVENOUS | Status: AC
  Administered 2020-06-16: 10 mg via INTRAVENOUS
  Filled 2020-06-16: qty 10

## 2020-06-16 MED ORDER — ENOXAPARIN SODIUM 60 MG/0.6ML ~~LOC~~ SOLN
50.0000 mg | SUBCUTANEOUS | Status: DC
  Filled 2020-06-16: qty 0.6

## 2020-06-16 MED ORDER — ONDANSETRON HCL 4 MG/2ML IJ SOLN: 4.0000 mg | Freq: Four times a day (QID) | INTRAMUSCULAR | Status: DC | PRN

## 2020-06-16 MED ORDER — EPINEPHRINE 0.3 MG/0.3ML IJ SOAJ
0.3000 mg | INTRAMUSCULAR | Status: DC | PRN
  Filled 2020-06-16: qty 0.3

## 2020-06-16 MED ORDER — GABAPENTIN 300 MG PO CAPS
300.0000 mg | ORAL_CAPSULE | Freq: Every day | ORAL | Status: DC
  Administered 2020-06-18: 300 mg via ORAL
  Filled 2020-06-16 (×2): qty 1

## 2020-06-16 MED ORDER — ASPIRIN EC 81 MG PO TBEC
81.0000 mg | DELAYED_RELEASE_TABLET | Freq: Every day | ORAL | Status: DC
  Administered 2020-06-17 – 2020-06-19 (×3): 81 mg via ORAL
  Filled 2020-06-16 (×3): qty 1

## 2020-06-16 MED ORDER — METOPROLOL TARTRATE 5 MG/5ML IV SOLN
5.0000 mg | INTRAVENOUS | Status: AC | PRN
  Administered 2020-06-16 (×3): 5 mg via INTRAVENOUS
  Filled 2020-06-16 (×3): qty 5

## 2020-06-16 MED ORDER — DILTIAZEM HCL-DEXTROSE 125-5 MG/125ML-% IV SOLN (PREMIX)
5.0000 mg/h | INTRAVENOUS | Status: DC
  Administered 2020-06-16: 5 mg/h via INTRAVENOUS
  Filled 2020-06-16: qty 125

## 2020-06-16 MED ORDER — PRAVASTATIN SODIUM 40 MG PO TABS
40.0000 mg | ORAL_TABLET | Freq: Every evening | ORAL | Status: DC
  Administered 2020-06-18: 40 mg via ORAL
  Filled 2020-06-16 (×2): qty 1

## 2020-06-16 NOTE — ED Provider Notes (Signed)
[facility] Emergency Department Provider Note   ____________________________________________   First MD Initiated Contact with Patient 06/16/20 2158     (approximate)  I have reviewed the triage vital signs and the nursing notes.   HISTORY  Chief Complaint Chest Pain    HPI Tammy [name] is a 69 y.o. female with past medical history of hypertension, familial hyperlipidemia, and diabetes who presents to the ED complaining of chest pain.  Patient reports acute onset of sharp pain in the center of her chest starting about 30 minutes prior to arrival while she was laying in bed.  Pain has been constant since then and exacerbated whenever she goes to take a deep breath.  She feels like she cannot catch her breath because of the pain.  She denies any palpitations and does not feel like her heart is racing.  She has never had similar symptoms in the past, is not sure whether she has any history of arrhythmia.  She follows with cardiology for familial hyperlipidemia but denies any history of CAD or atrial fibrillation.        Past Medical History:  Diagnosis Date  . Agatston coronary artery calcium score greater than 400   . Diabetes mellitus ([place])    x 3 years  . DJD (degenerative joint disease)   . Encephalitis   . Fibromyalgia   . HTN (hypertension)   . Hyperlipidemia   . Sleep apnea    No CPAP    Patient Active Problem List   Diagnosis Date Noted  . Chronic venous insufficiency 12/30/2018  . Varicose veins of both lower extremities with inflammation 12/30/2018  . DJD (degenerative joint disease) 12/30/2018  . Snoring 11/27/2018  . Daytime sleepiness 11/27/2018  . Excessive sleepiness 11/27/2018  . Educated about COVID-19 virus infection 11/27/2018  . SOB (shortness of breath) 11/27/2018  . Hyperlipidemia 05/20/2015  . Knee pain 06/06/2012    Past Surgical History:  Procedure Laterality Date  . APPENDECTOMY    . BREAST CYST EXCISION     . BREAST EXCISIONAL BIOPSY Left 2004  . KNEE ARTHROSCOPY      Prior to Admission medications   Medication Sig Start Date End Date Taking? Authorizing Provider  amLODipine (NORVASC) 10 MG tablet Take 10 mg by mouth daily.    [provider]  amLODipine-valsartan (EXFORGE) 10-320 MG per tablet Take 1 tablet by mouth daily.     [provider]  aspirin 81 MG tablet Take 81 mg by mouth daily.    [provider]  beta carotene w/minerals (OCUVITE) tablet Take 1 tablet by mouth daily.    [provider]  DULoxetine (CYMBALTA) 60 MG capsule Take 60 mg by mouth daily.    [provider]  EPINEPHrine 0.3 mg/0.3 mL IJ SOAJ injection Inject 0.3 mg into the muscle as needed.    [provider]  Evolocumab (REPATHA SURECLICK) 599 MG/ML SOAJ Inject 1 Dose into the skin every 14 (fourteen) days.    [provider]  Exenatide (BYDUREON [place]) Inject into the skin once a week.    [provider]  fesoterodine (TOVIAZ) 4 MG TB24 tablet Take 4 mg by mouth daily.    [provider]  fluorouracil (EFUDEX) 5 % cream Apply topically. 05/27/20   [provider]  gabapentin (NEURONTIN) 300 MG capsule Take 1 capsule (300 mg total) by mouth at bedtime. 03/03/20   Mcarthur Rossetti, MD  HYDROcodone-acetaminophen (NORCO/VICODIN) 5-325 MG tablet Take by mouth.  05/19/20   [provider]  meloxicam (MOBIC) 15 MG tablet Take 15 mg by mouth 2 (two) times daily.    [provider]  metFORMIN (GLUCOPHAGE) 1000 MG tablet Take 1,000 mg by mouth 2 (two) times daily with a meal.    [provider]  phentermine (ADIPEX-P) 37.5 MG tablet Take 37.5 mg by mouth at bedtime. 04/23/20   [provider]  pravastatin (PRAVACHOL) 40 MG tablet Take 1 tablet (40 mg total) by mouth every evening. 01/29/20 04/28/20  Hilty, Nadean Corwin, MD  valACYclovir HCl (VALTREX PO) valacyclovir    [provider]    valsartan-hydrochlorothiazide (DIOVAN-HCT) 320-25 MG tablet Take 1 tablet by mouth daily.    [provider]    Allergies Betadine [povidone iodine], Contrast media [iodinated diagnostic agents], Iodine, Metrizamide, Povidone-iodine, Shellfish allergy, and Hydrocodone-acetaminophen  Family History  Problem Relation Age of Onset  . CAD Mother 81  . CAD Brother 42  . Breast cancer Cousin     Social History Social History   Tobacco Use  . Smoking status: Former Smoker    Packs/day: 1.00    Years: 30.00    Pack years: 30.00    Types: Cigarettes    Quit date: 11/01/2008    Years since quitting: 11.6  . Smokeless tobacco: Never Used  Vaping Use  . Vaping Use: Never used  Substance Use Topics  . Alcohol use: No    Alcohol/week: 0.0 standard drinks  . Drug use: No    Review of Systems  Constitutional: No fever/chills Eyes: No visual changes. ENT: No sore throat. Cardiovascular: Positive for chest pain. Respiratory: Positive for shortness of breath. Gastrointestinal: No abdominal pain.  No nausea, no vomiting.  No diarrhea.  No constipation. Genitourinary: Negative for dysuria. Musculoskeletal: Negative for back pain. Skin: Negative for rash. Neurological: Negative for headaches, focal weakness or numbness.  ____________________________________________   PHYSICAL EXAM:  VITAL SIGNS: ED Triage Vitals  Enc Vitals Group     BP 06/16/20 2154 93/61     Pulse Rate 06/16/20 2154 96     Resp 06/16/20 2154 18     Temp 06/16/20 2154 98.4 F (36.9 C)     Temp Source 06/16/20 2154 Oral     SpO2 06/16/20 2154 92 %     Weight 06/16/20 2149 220 lb (99.8 kg)     Height 06/16/20 2149 5' (1.524 m)     Head Circumference --      Peak Flow --      Pain Score 06/16/20 2149 10     Pain Loc --      Pain Edu? --      Excl. in [place]? --     Constitutional: Alert and oriented. Eyes: Conjunctivae are normal. Head: Atraumatic. Nose: No congestion/rhinnorhea. Mouth/Throat:  Mucous membranes are moist. Neck: Normal ROM Cardiovascular: Tachycardic, irregularly irregular rhythm. Grossly normal heart sounds.  2+ radial pulses bilaterally. Respiratory: Normal respiratory effort.  No retractions. Lungs CTAB. Gastrointestinal: Soft and nontender. No distention. Genitourinary: deferred Musculoskeletal: No lower extremity tenderness nor edema. Neurologic:  Normal speech and language. No gross focal neurologic deficits are appreciated. Skin:  Skin is warm, dry and intact. No rash noted. Psychiatric: Mood and affect are normal. Speech and behavior are normal.  ____________________________________________   LABS (all labs ordered are listed, but only abnormal results are displayed)  Labs Reviewed  CBC WITH DIFFERENTIAL/PLATELET - Abnormal; Notable for the following components:      Result Value   WBC  16.7 (*)    Neutro Abs 10.8 (*)    Monocytes Absolute 1.3 (*)    Abs Immature Granulocytes 0.08 (*)    All other components within normal limits  COMPREHENSIVE METABOLIC PANEL - Abnormal; Notable for the following components:   Glucose, Bld 113 (*)    BUN 28 (*)    Creatinine, Ser 1.02 (*)    GFR, Estimated 60 (*)    All other components within normal limits  RESP PANEL BY RT-PCR (FLU A&B, COVID) ARPGX2  MAGNESIUM  BRAIN NATRIURETIC PEPTIDE  TSH  TROPONIN I (HIGH SENSITIVITY)   ____________________________________________  EKG  ED ECG REPORT I, Blake Divine, the attending physician, personally viewed and interpreted this ECG.   Date: 06/16/2020  EKG Time: 21:45  Rate: 158  Rhythm: atrial fibrillation, rate 158  Axis: Normal  Intervals:none  ST&T Change: None  PROCEDURES  Procedure(s) performed (including Critical Care):  .Critical Care Performed by: Blake Divine, MD Authorized by: Blake Divine, MD   Critical care provider statement:    Critical care time (minutes):  45   Critical care time was exclusive of:  Separately billable  procedures and treating other patients and teaching time   Critical care was necessary to treat or prevent imminent or life-threatening deterioration of the following conditions:  Cardiac failure   Critical care was time spent personally by me on the following activities:  Discussions with consultants, evaluation of patient's response to treatment, examination of patient, ordering and performing treatments and interventions, ordering and review of laboratory studies, ordering and review of radiographic studies, pulse oximetry, re-evaluation of patient's condition, obtaining history from patient or surrogate and review of old charts   I assumed direction of critical care for this patient from another provider in my specialty: no       ____________________________________________   INITIAL IMPRESSION / ASSESSMENT AND PLAN / ED COURSE       69 year old female with past medical history of hypertension, familial hyperlipidemia, and diabetes who presents to the ED with sudden onset sharp pain in the center of her chest associated with difficulty catching her breath.  Patient noted to be in atrial fibrillation with RVR on arrival, no ischemic changes noted on EKG.  Blood pressure is soft but patient is maintaining well with no lightheadedness at this time.  We will trial IV pushes of metoprolol to attempt to control heart rate, which will hopefully also improve her blood pressure.  If she were to become unstable, we will need to consider cardioversion.  We will check troponin and chest x-ray, but I suspect her chest pain and shortness of breath are secondary to atrial fibrillation.  Chest x-ray reviewed by me and shows no infiltrate, edema, or effusion.  Lab work thus far unremarkable, patient with leukocytosis but no infectious symptoms at this time.  Troponin within normal limits and patient reports chest pain is improving as heart rate improved following metoprolol x3.  Unfortunately, her heart rate  remains around 120 but with stable blood pressure.  We will start patient on diltiazem drip for further rate control and case discussed with hospitalist for admission.      ____________________________________________   FINAL CLINICAL IMPRESSION(S) / ED DIAGNOSES  Final diagnoses:  Atrial fibrillation with RVR ([place])  Nonspecific chest pain     ED Discharge Orders    None       Note:  This document was prepared using Dragon voice recognition software and may include unintentional dictation errors.   Malyiah Fellows,  Juanda Crumble, MD 06/16/20 2303

## 2020-06-16 NOTE — ED Triage Notes (Signed)
Pt to triage via w/c with no distress noted; Pt reports mid CP and SHOB, onset 59min PTA while laying down; denies radiating pain; st hx of same

## 2020-06-16 NOTE — Progress Notes (Signed)
Anticoagulation monitoring(Lovenox):  69 yo female ordered Lovenox 40 mg Q24h  Filed Weights   06/16/20 2149  Weight: 99.8 kg (220 lb)   BMI 43   Lab Results  Component Value Date   CREATININE 1.02 (H) 06/16/2020   CREATININE 1.02 (H) 05/17/2020   CREATININE 0.70 03/20/2018   Estimated Creatinine Clearance: 56 mL/min (A) (by C-G formula based on SCr of 1.02 mg/dL (H)). Hemoglobin & Hematocrit     Component Value Date/Time   HGB 12.8 06/16/2020 2154   HCT 38.9 06/16/2020 2154     Per Protocol for Patient with estCrcl > 30 ml/min and BMI > 30, will transition to Lovenox 50 mg Q24h.

## 2020-06-16 NOTE — H&P (Addendum)
History and Physical    Tammy [name] AJO:[id number] DOB: 04-08-1951 DOA: 06/16/2020  PCP: [name], MD   Patient coming from: Home  I have personally briefly reviewed patient's old medical records in [place]  Chief Complaint: Chest pain  HPI: Tammy [name] is a 69 y.o. female with medical history significant for morbid obesity (BMI 42), diabetes mellitus, dyslipidemia and hypertension who presents to the ER for evaluation of chest pain.  Onset of pain was acute and started about 30 minutes prior to her arrival to the ER while she was in bed.  The pain has been constant since onset and worsens when she takes a deep breath or moves.  She also complains of shortness of breath but denies having any palpitations or diaphoresis.  She has no nausea or vomiting and denies having any fever, no chills, no dizziness, no lightheadedness, no changes in her bowel habits or urinary symptoms. Labs show sodium 138, potassium 3.5, chloride 101, bicarb 26, glucose 113, BUN 28, creatinine 1.02, calcium 9.2, alkaline phosphatase 63, albumin 4.0, AST 17, ALT 14, total protein 7.4, troponin V, white count 16.7, hemoglobin 12.8, hematocrit 38.9, MCV 94, RDW 13.8, platelet count 321 Respiratory viral panel is pending at the time of this H&P Chest x-ray reviewed by me shows no active disease Twelve-lead EKG reviewed by me shows A. fib with a rapid ventricular rate    ED Course: Patient is a 69 year old female who presents to the ER for evaluation of sudden onset chest pain associated with shortness of breath and found to be in rapid A. Fib which appears to be new onset.  Patient received 3 doses of IV metoprolol in the ER without any improvement in her heart rate.  She has been started on a Cardizem drip and will be admitted to the hospital for further evaluation   Review of Systems: As per HPI otherwise 10 point review of systems negative.    Past Medical History:  Diagnosis Date  .  Agatston coronary artery calcium score greater than 400   . Diabetes mellitus ([place])    x 3 years  . DJD (degenerative joint disease)   . Encephalitis   . Fibromyalgia   . HTN (hypertension)   . Hyperlipidemia   . Sleep apnea    No CPAP    Past Surgical History:  Procedure Laterality Date  . APPENDECTOMY    . BREAST CYST EXCISION    . BREAST EXCISIONAL BIOPSY Left 2004  . KNEE ARTHROSCOPY       reports that she quit smoking about 11 years ago. Her smoking use included cigarettes. She has a 30.00 pack-year smoking history. She has never used smokeless tobacco. She reports that she does not drink alcohol and does not use drugs.  Allergies  Allergen Reactions  . Betadine [Povidone Iodine] Anaphylaxis  . Contrast Media [Iodinated Diagnostic Agents] Anaphylaxis  . Iodine Anaphylaxis  . Metrizamide Anaphylaxis  . Povidone-Iodine Anaphylaxis  . Shellfish Allergy Anaphylaxis  . Hydrocodone-Acetaminophen Nausea Only    Family History  Problem Relation Age of Onset  . CAD Mother 31  . CAD Brother 11  . Breast cancer Cousin      Prior to Admission medications   Medication Sig Start Date End Date Taking? Authorizing Provider  amLODipine (NORVASC) 10 MG tablet Take 10 mg by mouth daily.    [provider]  amLODipine-valsartan (EXFORGE) 10-320 MG per tablet Take 1 tablet by mouth daily.     [provider]  aspirin 81 MG tablet Take 81 mg by mouth daily.    [provider]  beta carotene w/minerals (OCUVITE) tablet Take 1 tablet by mouth daily.    [provider]  DULoxetine (CYMBALTA) 60 MG capsule Take 60 mg by mouth daily.    [provider]  EPINEPHrine 0.3 mg/0.3 mL IJ SOAJ injection Inject 0.3 mg into the muscle as needed.    [provider]  Evolocumab (REPATHA SURECLICK) 578 MG/ML SOAJ Inject 1 Dose into the skin every 14 (fourteen) days.    [provider]  Exenatide (BYDUREON [place]) Inject into the skin once a  week.    [provider]  fesoterodine (TOVIAZ) 4 MG TB24 tablet Take 4 mg by mouth daily.    [provider]  fluorouracil (EFUDEX) 5 % cream Apply topically. 05/27/20   [provider]  gabapentin (NEURONTIN) 300 MG capsule Take 1 capsule (300 mg total) by mouth at bedtime. 03/03/20   Mcarthur Rossetti, MD  HYDROcodone-acetaminophen (NORCO/VICODIN) 5-325 MG tablet Take by mouth. 05/19/20   [provider]  meloxicam (MOBIC) 15 MG tablet Take 15 mg by mouth 2 (two) times daily.    [provider]  metFORMIN (GLUCOPHAGE) 1000 MG tablet Take 1,000 mg by mouth 2 (two) times daily with a meal.    [provider]  phentermine (ADIPEX-P) 37.5 MG tablet Take 37.5 mg by mouth at bedtime. 04/23/20   [provider]  pravastatin (PRAVACHOL) 40 MG tablet Take 1 tablet (40 mg total) by mouth every evening. 01/29/20 04/28/20  Hilty, Nadean Corwin, MD  valACYclovir HCl (VALTREX PO) valacyclovir    [provider]  valsartan-hydrochlorothiazide (DIOVAN-HCT) 320-25 MG tablet Take 1 tablet by mouth daily.    [provider]    Physical Exam: Vitals:   06/16/20 2149 06/16/20 2154 06/16/20 2217 06/16/20 2221  BP:  93/61 91/74 107/77  Pulse:  96 (!) 119 (!) 123  Resp:  18 (!) 22 19  Temp:  98.4 F (36.9 C)    TempSrc:  Oral    SpO2:  92% 96% 96%  Weight: 99.8 kg     Height: 5' (1.524 m)        Vitals:   06/16/20 2149 06/16/20 2154 06/16/20 2217 06/16/20 2221  BP:  93/61 91/74 107/77  Pulse:  96 (!) 119 (!) 123  Resp:  18 (!) 22 19  Temp:  98.4 F (36.9 C)    TempSrc:  Oral    SpO2:  92% 96% 96%  Weight: 99.8 kg     Height: 5' (1.524 m)       Constitutional: NAD, alert and oriented x 3.  Morbidly obese Eyes: PERRL, lids and conjunctivae normal ENMT: Mucous membranes are moist.  Neck: normal, supple, no masses, no thyromegaly Respiratory: clear to auscultation bilaterally, no wheezing, no crackles. Normal  respiratory effort. No accessory muscle use.  Cardiovascular: Irregularly irregular, tachycardic, no murmurs / rubs / gallops. No extremity edema. 2+ pedal pulses. No carotid bruits.  Abdomen: no tenderness, no masses palpated. No hepatosplenomegaly. Bowel sounds positive.  Central adiposity Musculoskeletal: no clubbing / cyanosis. No joint deformity upper and lower extremities.  Skin: no rashes, lesions, ulcers.  Varicose veins on lower extremities Neurologic: No gross focal neurologic deficit. Psychiatric: Normal mood and affect.   Labs on Admission: I have personally reviewed following labs and imaging studies  CBC: Recent Labs  Lab 06/16/20 2154  WBC 16.7*  NEUTROABS 10.8*  HGB  12.8  HCT 38.9  MCV 94.0  PLT 323   Basic Metabolic Panel: Recent Labs  Lab 06/16/20 2154  NA 138  K 3.5  CL 101  CO2 26  GLUCOSE 113*  BUN 28*  CREATININE 1.02*  CALCIUM 9.2   GFR: Estimated Creatinine Clearance: 56 mL/min (A) (by C-G formula based on SCr of 1.02 mg/dL (H)). Liver Function Tests: Recent Labs  Lab 06/16/20 2154  AST 17  ALT 14  ALKPHOS 63  BILITOT 1.0  PROT 7.4  ALBUMIN 4.0   No results for input(s): LIPASE, AMYLASE in the last 168 hours. No results for input(s): AMMONIA in the last 168 hours. Coagulation Profile: No results for input(s): INR, PROTIME in the last 168 hours. Cardiac Enzymes: No results for input(s): CKTOTAL, CKMB, CKMBINDEX, TROPONINI in the last 168 hours. BNP (last 3 results) No results for input(s): PROBNP in the last 8760 hours. HbA1C: No results for input(s): HGBA1C in the last 72 hours. CBG: No results for input(s): GLUCAP in the last 168 hours. Lipid Profile: No results for input(s): CHOL, HDL, LDLCALC, TRIG, CHOLHDL, LDLDIRECT in the last 72 hours. Thyroid Function Tests: No results for input(s): TSH, T4TOTAL, FREET4, T3FREE, THYROIDAB in the last 72 hours. Anemia Panel: No results for input(s): VITAMINB12, FOLATE, FERRITIN, TIBC,  IRON, RETICCTPCT in the last 72 hours. Urine analysis:    Component Value Date/Time   COLORURINE YELLOW 03/30/2016 0424   APPEARANCEUR CLEAR 03/30/2016 0424   LABSPEC 1.020 03/30/2016 0424   PHURINE 7.5 03/30/2016 0424   GLUCOSEU NEGATIVE 03/30/2016 0424   HGBUR NEGATIVE 03/30/2016 0424   BILIRUBINUR NEGATIVE 03/30/2016 0424   KETONESUR NEGATIVE 03/30/2016 0424   PROTEINUR >300 (A) 03/30/2016 0424   UROBILINOGEN 0.2 04/26/2008 1526   NITRITE NEGATIVE 03/30/2016 0424   LEUKOCYTESUR NEGATIVE 03/30/2016 0424    Radiological Exams on Admission: DG Chest Portable 1 View  Result Date: 06/16/2020 CLINICAL DATA:  Chest pain, dyspnea EXAM: PORTABLE CHEST 1 VIEW COMPARISON:  05/17/2020 FINDINGS: Lungs are well expanded, symmetric, and clear. No pneumothorax or pleural effusion. Cardiac size within normal limits. Pulmonary vascularity is normal. Osseous structures are age-appropriate. No acute bone abnormality. IMPRESSION: No active disease. Electronically Signed   By: Fidela Salisbury MD   On: 06/16/2020 22:25    EKG: Independently reviewed.  Atrial fibrillation with rapid ventricular rate  Assessment/Plan Principal Problem:   New onset atrial fibrillation (HCC) Active Problems:   Diabetes mellitus (HCC)   HTN (hypertension)   Sleep apnea   Obesity, Class III, BMI 40-49.9 (morbid obesity) ([place])    New onset atrial fibrillation Patient presents to the emergency room for evaluation of sudden onset chest pain associated with shortness of breath She was noted to be in atrial fibrillation with a rapid ventricular rate which appears to be new onset and may be related to underlying obstructive sleep apnea She received IV metoprolol without any improvement in her heart rate and is currently on a Cardizem drip Patient has a CHADS2VASC score of 4 and ideally requires anticoagulation as primary prophylaxis for an acute stroke Start patient on therapeutic Lovenox Obtain TSH levels Obtain 2D  echocardiogram to assess LVEF and rule out valvular pathology We will request cardiology consult    Diabetes mellitus Maintain consistent carbohydrate diet Hold Metformin Glycemic control with sliding scale insulin   Morbid obesity (BMI 42) Complicates overall prognosis and care   Hypertension Hold amlodipine and valsartan since patient is normotensive    Dyslipidemia Continue statins  DVT prophylaxis: Lovenox Code Status: Full code Family Communication: Greater than 50% of time was spent discussing plan of care with patient at the bedside.  She verbalizes understanding and agrees with the plan. Disposition Plan: Back to previous home environment Consults called: Cardiology    Shyniece Scripter MD [facility]     06/16/2020, 11:16 PM

## 2020-06-17 ENCOUNTER — Telehealth: Payer: Self-pay | Admitting: Physical Medicine and Rehabilitation

## 2020-06-17 ENCOUNTER — Inpatient Hospital Stay
Admit: 2020-06-17 | Discharge: 2020-06-17 | Disposition: A | Discharge status: Home / Self Care | Payer: Medicare Other | Attending: Nurse Practitioner | Admitting: Nurse Practitioner

## 2020-06-17 ENCOUNTER — Inpatient Hospital Stay: Admit: 2020-06-17 | Payer: Medicare Other

## 2020-06-17 ENCOUNTER — Ambulatory Visit: Payer: Medicare Other | Admitting: Physical Medicine and Rehabilitation

## 2020-06-17 DIAGNOSIS — E1140 Type 2 diabetes mellitus with diabetic neuropathy, unspecified: Secondary | ICD-10-CM

## 2020-06-17 DIAGNOSIS — F32A Depression, unspecified: Secondary | ICD-10-CM

## 2020-06-17 LAB — CBC
HCT: 36.5 % (ref 36.0–46.0)
Hemoglobin: 11.9 g/dL — ABNORMAL LOW (ref 12.0–15.0)
MCH: 30.7 pg (ref 26.0–34.0)
MCHC: 32.6 g/dL (ref 30.0–36.0)
MCV: 94.3 fL (ref 80.0–100.0)
Platelets: 279 10*3/uL (ref 150–400)
RBC: 3.87 MIL/uL (ref 3.87–5.11)
RDW: 14 % (ref 11.5–15.5)
WBC: 14.9 10*3/uL — ABNORMAL HIGH (ref 4.0–10.5)
nRBC: 0 % (ref 0.0–0.2)

## 2020-06-17 LAB — T4, FREE: Free T4: 1.27 ng/dL — ABNORMAL HIGH (ref 0.61–1.12)

## 2020-06-17 LAB — BASIC METABOLIC PANEL
Anion gap: 11 (ref 5–15)
BUN: 27 mg/dL — ABNORMAL HIGH (ref 8–23)
CO2: 24 mmol/L (ref 22–32)
Calcium: 9.1 mg/dL (ref 8.9–10.3)
Chloride: 101 mmol/L (ref 98–111)
Creatinine, Ser: 0.85 mg/dL (ref 0.44–1.00)
GFR, Estimated: >60 mL/min (ref >60)
Glucose, Bld: 143 mg/dL — ABNORMAL HIGH (ref 70–99)
Potassium: 3.6 mmol/L (ref 3.5–5.1)
Sodium: 136 mmol/L (ref 135–145)

## 2020-06-17 LAB — LIPID PANEL
Cholesterol: 143 mg/dL (ref 0–200)
HDL: 62 mg/dL (ref >40)
LDL Cholesterol: 71 mg/dL (ref 0–99)
Total CHOL/HDL Ratio: 2.3 RATIO
Triglycerides: 50 mg/dL (ref <150)
VLDL: 10 mg/dL (ref 0–40)

## 2020-06-17 LAB — CBG MONITORING, ED
Glucose-Capillary: 141 mg/dL — ABNORMAL HIGH (ref 70–99)
Glucose-Capillary: 115 mg/dL — ABNORMAL HIGH (ref 70–99)

## 2020-06-17 LAB — ECHOCARDIOGRAM COMPLETE
AR max vel: 1.42 cm2
AV Area VTI: 1.66 cm2
AV Area mean vel: 1.4 cm2
AV Mean grad: 4 mmHg
AV Peak grad: 7.4 mmHg
Ao pk vel: 1.36 m/s
Area-P 1/2: 5.38 cm2
Height: 60 in
S' Lateral: 3.47 cm
Weight: 3520 oz

## 2020-06-17 LAB — TSH: TSH: 1.454 u[IU]/mL (ref 0.350–4.500)

## 2020-06-17 LAB — GLUCOSE, CAPILLARY: Glucose-Capillary: 118 mg/dL — ABNORMAL HIGH (ref 70–99)

## 2020-06-17 LAB — HEMOGLOBIN A1C
Hgb A1c MFr Bld: 5.6 % (ref 4.8–5.6)
Mean Plasma Glucose: 114.02 mg/dL

## 2020-06-17 LAB — TROPONIN I (HIGH SENSITIVITY): Troponin I (High Sensitivity): 4 ng/L (ref <18)

## 2020-06-17 MED ORDER — APIXABAN 5 MG PO TABS
5.0000 mg | ORAL_TABLET | Freq: Two times a day (BID) | ORAL | Status: DC
  Administered 2020-06-17 – 2020-06-19 (×5): 5 mg via ORAL
  Filled 2020-06-17 (×6): qty 1

## 2020-06-17 MED ORDER — ENOXAPARIN SODIUM 100 MG/ML ~~LOC~~ SOLN
100.0000 mg | Freq: Two times a day (BID) | SUBCUTANEOUS | Status: DC
  Filled 2020-06-17 (×2): qty 1

## 2020-06-17 MED ORDER — POTASSIUM CHLORIDE CRYS ER 20 MEQ PO TBCR: 40.0000 meq | EXTENDED_RELEASE_TABLET | Freq: Once | ORAL | Status: DC

## 2020-06-17 MED ORDER — METOPROLOL TARTRATE 50 MG PO TABS
50.0000 mg | ORAL_TABLET | Freq: Two times a day (BID) | ORAL | Status: DC
  Administered 2020-06-17 – 2020-06-18 (×2): 50 mg via ORAL
  Filled 2020-06-17 (×2): qty 1

## 2020-06-17 MED ORDER — DILTIAZEM HCL 30 MG PO TABS
30.0000 mg | ORAL_TABLET | Freq: Four times a day (QID) | ORAL | Status: DC
  Administered 2020-06-17 – 2020-06-18 (×3): 30 mg via ORAL
  Filled 2020-06-17 (×3): qty 1

## 2020-06-17 MED ORDER — FENTANYL CITRATE (PF) 100 MCG/2ML IJ SOLN
25.0000 ug | Freq: Once | INTRAMUSCULAR | Status: AC
  Administered 2020-06-17: 25 ug via INTRAVENOUS
  Filled 2020-06-17: qty 2

## 2020-06-17 MED ORDER — METOPROLOL TARTRATE 25 MG PO TABS
25.0000 mg | ORAL_TABLET | Freq: Two times a day (BID) | ORAL | Status: DC
  Administered 2020-06-17: 25 mg via ORAL
  Filled 2020-06-17: qty 1

## 2020-06-17 NOTE — Telephone Encounter (Signed)
Appointment cancelled

## 2020-06-17 NOTE — ED Notes (Signed)
Pt resting in bed, repositioned for comfort, states pain has decreased but mildly shob, sats 91% on 1L [place], increased to 2L for comfort. Pt in no distress, water requested and given. Call light in reach.

## 2020-06-17 NOTE — ED Notes (Signed)
Report given to Jesse Sans, rn

## 2020-06-17 NOTE — ED Notes (Signed)
Pt in bed resting with eyes closed, rise and fall of chest noted, call light in reach, bed locked and low. Heart monitor intact.

## 2020-06-17 NOTE — Progress Notes (Signed)
*  PRELIMINARY RESULTS* Echocardiogram 2D Echocardiogram has been performed.  Tammy [name] 06/17/2020, 1:32 PM

## 2020-06-17 NOTE — ED Notes (Signed)
Pt resting in bed with no complaints, NAD, husband at bedside.

## 2020-06-17 NOTE — Telephone Encounter (Signed)
Pt would like to cancel her 1 pm appt for today as she is in the hospital

## 2020-06-17 NOTE — Consult Note (Signed)
Tammy [name] is a 69 y.o. female  [id number]  Primary Cardiologist: Neoma Laming Reason for Consultation: Atrial Fibrillation with RVR  HPI: Patient is a 69 year old female with past medical history of hypertension, hyperlipidemia, sleep apnea and DMII. Patient is presenting to the hospital d/t chest pain and dyspnea. Patient denied any palpitations or diaphoresis. Patient was found to be in Atrial fibrillation with RVR. Patient did not respond to Metoprolol IVP x3 and was started on a Cardizem infusion. We have been consulted to help with atrial fibrillation management.   Review of Systems: Patient denies chest pain and states it has now become more epigastric. States she continues to have mild dyspnea and is requiring supplemental oxygen. Denies dizziness or palpitations.   Past Medical History:  Diagnosis Date  . Agatston coronary artery calcium score greater than 400   . Diabetes mellitus ([place])    x 3 years  . DJD (degenerative joint disease)   . Encephalitis   . Fibromyalgia   . HTN (hypertension)   . Hyperlipidemia   . Sleep apnea    No CPAP    (Not in a hospital admission)    . apixaban  5 mg Oral BID  . aspirin EC  81 mg Oral Daily  . DULoxetine  60 mg Oral Daily  . fesoterodine  4 mg Oral Daily  . fluorouracil   Topical BID  . gabapentin  300 mg Oral QHS  . insulin aspart  0-20 Units Subcutaneous TID WC  . metoprolol tartrate  50 mg Oral BID  . multivitamin-lutein  1 capsule Oral Daily  . phentermine  37.5 mg Oral QHS  . pravastatin  40 mg Oral QPM    Infusions:   Allergies  Allergen Reactions  . Betadine [Povidone Iodine] Anaphylaxis  . Contrast Media [Iodinated Diagnostic Agents] Anaphylaxis  . Iodine Anaphylaxis  . Metrizamide Anaphylaxis  . Povidone-Iodine Anaphylaxis  . Shellfish Allergy Anaphylaxis  . Hydrocodone-Acetaminophen Nausea Only    Social History   Socioeconomic History  . Marital status: Married    Spouse name: Not on  file  . Number of children: 0  . Years of education: Not on file  . Highest education level: Not on file  Occupational History  . Not on file  Tobacco Use  . Smoking status: Former Smoker    Packs/day: 1.00    Years: 30.00    Pack years: 30.00    Types: Cigarettes    Quit date: 11/01/2008    Years since quitting: 11.6  . Smokeless tobacco: Never Used  Vaping Use  . Vaping Use: Never used  Substance and Sexual Activity  . Alcohol use: No    Alcohol/week: 0.0 standard drinks  . Drug use: No  . Sexual activity: Not on file  Other Topics Concern  . Not on file  Social History Narrative   Lives with husband   Social Determinants of Health   Financial Resource Strain:   . Difficulty of Paying Living Expenses: Not on file  Food Insecurity:   . Worried About Charity fundraiser in the Last Year: Not on file  . Ran Out of Food in the Last Year: Not on file  Transportation Needs:   . Lack of Transportation (Medical): Not on file  . Lack of Transportation (Non-Medical): Not on file  Physical Activity:   . Days of Exercise per Week: Not on file  . Minutes of Exercise per Session: Not on file  Stress:   .  Feeling of Stress : Not on file  Social Connections:   . Frequency of Communication with Friends and Family: Not on file  . Frequency of Social Gatherings with Friends and Family: Not on file  . Attends Religious Services: Not on file  . Active Member of Clubs or Organizations: Not on file  . Attends Archivist Meetings: Not on file  . Marital Status: Not on file  Intimate Partner Violence:   . Fear of Current or Ex-Partner: Not on file  . Emotionally Abused: Not on file  . Physically Abused: Not on file  . Sexually Abused: Not on file    Family History  Problem Relation Age of Onset  . CAD Mother 39  . CAD Brother 32  . Breast cancer Cousin     PHYSICAL EXAM: Vitals:   06/17/20 1000 06/17/20 1015  BP: 101/72   Pulse: 74 (!) 109  Resp: 16 17  Temp:     SpO2: 94% 93%    No intake or output data in the 24 hours ending 06/17/20 1043  General:  Well appearing. No respiratory difficulty HEENT: normal Neck: supple. no JVD. Carotids 2+ bilat; no bruits. No lymphadenopathy or thryomegaly appreciated. Cor: irregularly irregular Lungs: clear Abdomen: soft, nontender, nondistended. No hepatosplenomegaly. No bruits or masses. Good bowel sounds. Extremities: no cyanosis, clubbing, rash, edema Neuro: alert & oriented x 3, cranial nerves grossly intact. moves all 4 extremities w/o difficulty. Affect pleasant.  ECG: Atrial Fibrillation with RVR. 158/bpm  Results for orders placed or performed during the hospital encounter of 06/16/20 (from the past 24 hour(s))  CBC with Differential     Status: Abnormal   Collection Time: 06/16/20  9:54 PM  Result Value Ref Range   WBC 16.7 (H) 4.0 - 10.5 K/uL   RBC 4.14 3.87 - 5.11 MIL/uL   Hemoglobin 12.8 12.0 - 15.0 g/dL   HCT 38.9 36 - 46 %   MCV 94.0 80.0 - 100.0 fL   MCH 30.9 26.0 - 34.0 pg   MCHC 32.9 30.0 - 36.0 g/dL   RDW 13.8 11.5 - 15.5 %   Platelets 321 150 - 400 K/uL   nRBC 0.0 0.0 - 0.2 %   Neutrophils Relative % 64 %   Neutro Abs 10.8 (H) 1.7 - 7.7 K/uL   Lymphocytes Relative 23 %   Lymphs Abs 3.9 0.7 - 4.0 K/uL   Monocytes Relative 8 %   Monocytes Absolute 1.3 (H) 0.1 - 1.0 K/uL   Eosinophils Relative 3 %   Eosinophils Absolute 0.5 0.0 - 0.5 K/uL   Basophils Relative 1 %   Basophils Absolute 0.1 0.0 - 0.1 K/uL   Immature Granulocytes 1 %   Abs Immature Granulocytes 0.08 (H) 0.00 - 0.07 K/uL  Comprehensive metabolic panel     Status: Abnormal   Collection Time: 06/16/20  9:54 PM  Result Value Ref Range   Sodium 138 135 - 145 mmol/L   Potassium 3.5 3.5 - 5.1 mmol/L   Chloride 101 98 - 111 mmol/L   CO2 26 22 - 32 mmol/L   Glucose, Bld 113 (H) 70 - 99 mg/dL   BUN 28 (H) 8 - 23 mg/dL   Creatinine, Ser 1.02 (H) 0.44 - 1.00 mg/dL   Calcium 9.2 8.9 - 10.3 mg/dL   Total Protein 7.4 6.5  - 8.1 g/dL   Albumin 4.0 3.5 - 5.0 g/dL   AST 17 15 - 41 U/L   ALT 14 0 - 44 U/L  Alkaline Phosphatase 63 38 - 126 U/L   Total Bilirubin 1.0 0.3 - 1.2 mg/dL   GFR, Estimated 60 (L) >60 mL/min   Anion gap 11 5 - 15  Troponin I (High Sensitivity)     Status: None   Collection Time: 06/16/20  9:54 PM  Result Value Ref Range   Troponin I (High Sensitivity) 5 <18 ng/L  Magnesium     Status: None   Collection Time: 06/16/20  9:54 PM  Result Value Ref Range   Magnesium 1.9 1.7 - 2.4 mg/dL  Brain natriuretic peptide     Status: Abnormal   Collection Time: 06/16/20  9:54 PM  Result Value Ref Range   B Natriuretic Peptide 263.3 (H) 0.0 - 100.0 pg/mL  TSH     Status: None   Collection Time: 06/16/20  9:54 PM  Result Value Ref Range   TSH 3.066 0.350 - 4.500 uIU/mL  Resp Panel by RT-PCR (Flu A&B, Covid) Nasopharyngeal Swab     Status: None   Collection Time: 06/16/20 10:16 PM   Specimen: Nasopharyngeal Swab; Nasopharyngeal(NP) swabs in vial transport medium  Result Value Ref Range   SARS Coronavirus 2 by RT PCR NEGATIVE NEGATIVE   Influenza A by PCR NEGATIVE NEGATIVE   Influenza B by PCR NEGATIVE NEGATIVE  TSH     Status: None   Collection Time: 06/17/20 12:14 AM  Result Value Ref Range   TSH 1.454 0.350 - 4.500 uIU/mL  T4, free     Status: Abnormal   Collection Time: 06/17/20 12:14 AM  Result Value Ref Range   Free T4 1.27 (H) 0.61 - 1.12 ng/dL  Hemoglobin A1c     Status: None   Collection Time: 06/17/20 12:14 AM  Result Value Ref Range   Hgb A1c MFr Bld 5.6 4.8 - 5.6 %   Mean Plasma Glucose 114.02 mg/dL  Troponin I (High Sensitivity)     Status: None   Collection Time: 06/17/20 12:14 AM  Result Value Ref Range   Troponin I (High Sensitivity) 4 <18 ng/L  Basic metabolic panel     Status: Abnormal   Collection Time: 06/17/20  4:50 AM  Result Value Ref Range   Sodium 136 135 - 145 mmol/L   Potassium 3.6 3.5 - 5.1 mmol/L   Chloride 101 98 - 111 mmol/L   CO2 24 22 - 32 mmol/L    Glucose, Bld 143 (H) 70 - 99 mg/dL   BUN 27 (H) 8 - 23 mg/dL   Creatinine, Ser 0.85 0.44 - 1.00 mg/dL   Calcium 9.1 8.9 - 10.3 mg/dL   GFR, Estimated >60 >60 mL/min   Anion gap 11 5 - 15  Lipid panel     Status: None   Collection Time: 06/17/20  4:50 AM  Result Value Ref Range   Cholesterol 143 0 - 200 mg/dL   Triglycerides 50 <150 mg/dL   HDL 62 >40 mg/dL   Total CHOL/HDL Ratio 2.3 RATIO   VLDL 10 0 - 40 mg/dL   LDL Cholesterol 71 0 - 99 mg/dL  CBC     Status: Abnormal   Collection Time: 06/17/20  4:50 AM  Result Value Ref Range   WBC 14.9 (H) 4.0 - 10.5 K/uL   RBC 3.87 3.87 - 5.11 MIL/uL   Hemoglobin 11.9 (L) 12.0 - 15.0 g/dL   HCT 36.5 36 - 46 %   MCV 94.3 80.0 - 100.0 fL   MCH 30.7 26.0 - 34.0 pg   MCHC 32.6  30.0 - 36.0 g/dL   RDW 14.0 11.5 - 15.5 %   Platelets 279 150 - 400 K/uL   nRBC 0.0 0.0 - 0.2 %  CBG monitoring, ED     Status: Abnormal   Collection Time: 06/17/20  8:53 AM  Result Value Ref Range   Glucose-Capillary 141 (H) 70 - 99 mg/dL   DG Chest Portable 1 View  Result Date: 06/16/2020 CLINICAL DATA:  Chest pain, dyspnea EXAM: PORTABLE CHEST 1 VIEW COMPARISON:  05/17/2020 FINDINGS: Lungs are well expanded, symmetric, and clear. No pneumothorax or pleural effusion. Cardiac size within normal limits. Pulmonary vascularity is normal. Osseous structures are age-appropriate. No acute bone abnormality. IMPRESSION: No active disease. Electronically Signed   By: Fidela Salisbury MD   On: 06/16/2020 22:25     ASSESSMENT AND PLAN: Patient presenting to the hospital with chest discomfort and dyspnea found to have Atrial fibrillation with RVR. Patient currently rate controlled in low 100s with diltiazem infusion and low dose metoprolol tartrate. Plan to wean off diltiazem drip and increase metoprolol tartrate to 50mg  bid. We will not start amiodarone as patient has an iodine allergy. Plan to obtain echocardiogram and then determine which anti-arrhythmic is best. Start eliquis  5mg  bid as patient's CHADVASc score of 4. Will continue to follow.  Chart review, assessment, and plan development 50 minutes.  Adaline Sill NP-C

## 2020-06-17 NOTE — Progress Notes (Signed)
Patient ID: Tammy [name], female   DOB: 01-01-1951, 69 y.o.   MRN: [id number] [facility] PROGRESS NOTE  Freya Zobrist Gienger ION:[id number] DOB: 09-01-1950 DOA: 06/16/2020 PCP: [name], MD  HPI/Subjective: Patient came in with shortness of breath and chest pain and found to be in rapid atrial fibrillation.  Patient initially started on Cardizem drip.  Objective: Vitals:   06/17/20 1100 06/17/20 1130  BP: 100/67 106/75  Pulse: 94 98  Resp: (!) 24 19  Temp:    SpO2: 93% 92%   No intake or output data in the 24 hours ending 06/17/20 1504 Filed Weights   06/16/20 2149  Weight: 99.8 kg    ROS: Review of Systems  Respiratory: Positive for shortness of breath. Negative for cough.   Cardiovascular: Positive for chest pain.  Gastrointestinal: Negative for abdominal pain, nausea and vomiting.   Exam: Physical Exam HENT:     Head: Normocephalic.     Mouth/Throat:     Pharynx: No oropharyngeal exudate.  Eyes:     General: Lids are normal.     Pupils: Pupils are equal, round, and reactive to light.  Cardiovascular:     Rate and Rhythm: Tachycardia present. Rhythm irregularly irregular.     Heart sounds: Normal heart sounds, S1 normal and S2 normal.  Pulmonary:     Breath sounds: Examination of the right-lower field reveals decreased breath sounds. Examination of the left-lower field reveals decreased breath sounds. Decreased breath sounds present. No wheezing, rhonchi or rales.  Abdominal:     Palpations: Abdomen is soft.     Tenderness: There is no abdominal tenderness.  Musculoskeletal:     Right lower leg: Swelling present.     Left lower leg: Swelling present.  Skin:    General: Skin is warm.     Findings: No rash.  Neurological:     Mental Status: She is alert and oriented to person, place, and time.       Data Reviewed: Basic Metabolic Panel: Recent Labs  Lab 06/16/20 2154 06/17/20 0450  NA 138 136  K 3.5 3.6  CL 101 101  CO2 26 24   GLUCOSE 113* 143*  BUN 28* 27*  CREATININE 1.02* 0.85  CALCIUM 9.2 9.1  MG 1.9  --    Liver Function Tests: Recent Labs  Lab 06/16/20 2154  AST 17  ALT 14  ALKPHOS 63  BILITOT 1.0  PROT 7.4  ALBUMIN 4.0   CBC: Recent Labs  Lab 06/16/20 2154 06/17/20 0450  WBC 16.7* 14.9*  NEUTROABS 10.8*  --   HGB 12.8 11.9*  HCT 38.9 36.5  MCV 94.0 94.3  PLT 321 279   BNP (last 3 results) Recent Labs    06/16/20 2154  BNP 263.3*     CBG: Recent Labs  Lab 06/17/20 0853 06/17/20 1154  GLUCAP 141* 115*    Recent Results (from the past 240 hour(s))  Resp Panel by RT-PCR (Flu A&B, Covid) Nasopharyngeal Swab     Status: None   Collection Time: 06/16/20 10:16 PM   Specimen: Nasopharyngeal Swab; Nasopharyngeal(NP) swabs in vial transport medium  Result Value Ref Range Status   SARS Coronavirus 2 by RT PCR NEGATIVE NEGATIVE Final    Comment: (NOTE) SARS-CoV-2 target nucleic acids are NOT DETECTED.  The SARS-CoV-2 RNA is generally detectable in upper respiratory specimens during the acute phase of infection. The lowest concentration of SARS-CoV-2 viral copies this assay can detect is 138 copies/mL. A negative result does not preclude SARS-Cov-2  infection and should not be used as the sole basis for treatment or other patient management decisions. A negative result may occur with  improper specimen collection/handling, submission of specimen other than nasopharyngeal swab, presence of viral mutation(s) within the areas targeted by this assay, and inadequate number of viral copies(<138 copies/mL). A negative result must be combined with clinical observations, patient history, and epidemiological information. The expected result is Negative.  Fact Sheet for Patients:  EntrepreneurPulse.com.au  Fact Sheet for Healthcare Providers:  IncredibleEmployment.be  This test is no t yet approved or cleared by the [place] FDA and  has been  authorized for detection and/or diagnosis of SARS-CoV-2 by FDA under an Emergency Use Authorization (EUA). This EUA will remain  in effect (meaning this test can be used) for the duration of the COVID-19 declaration under Section 564(b)(1) of the Act, 21 U.S.C.section 360bbb-3(b)(1), unless the authorization is terminated  or revoked sooner.       Influenza A by PCR NEGATIVE NEGATIVE Final   Influenza B by PCR NEGATIVE NEGATIVE Final    Comment: (NOTE) The Xpert Xpress SARS-CoV-2/FLU/RSV plus assay is intended as an aid in the diagnosis of influenza from Nasopharyngeal swab specimens and should not be used as a sole basis for treatment. Nasal washings and aspirates are unacceptable for Xpert Xpress SARS-CoV-2/FLU/RSV testing.  Fact Sheet for Patients: EntrepreneurPulse.com.au  Fact Sheet for Healthcare Providers: IncredibleEmployment.be  This test is not yet approved or cleared by the [place] FDA and has been authorized for detection and/or diagnosis of SARS-CoV-2 by FDA under an Emergency Use Authorization (EUA). This EUA will remain in effect (meaning this test can be used) for the duration of the COVID-19 declaration under Section 564(b)(1) of the Act, 21 U.S.C. section 360bbb-3(b)(1), unless the authorization is terminated or revoked.  Performed at [facility], [address] [place], [place] [address]      Studies: DG Chest Portable 1 View  Result Date: 06/16/2020 CLINICAL DATA:  Chest pain, dyspnea EXAM: PORTABLE CHEST 1 VIEW COMPARISON:  05/17/2020 FINDINGS: Lungs are well expanded, symmetric, and clear. No pneumothorax or pleural effusion. Cardiac size within normal limits. Pulmonary vascularity is normal. Osseous structures are age-appropriate. No acute bone abnormality. IMPRESSION: No active disease. Electronically Signed   By: Fidela Salisbury MD   On: 06/16/2020 22:25    Scheduled Meds: . apixaban  5 mg Oral  BID  . aspirin EC  81 mg Oral Daily  . DULoxetine  60 mg Oral Daily  . fesoterodine  4 mg Oral Daily  . fluorouracil   Topical BID  . gabapentin  300 mg Oral QHS  . insulin aspart  0-20 Units Subcutaneous TID WC  . metoprolol tartrate  50 mg Oral BID  . multivitamin-lutein  1 capsule Oral Daily  . phentermine  37.5 mg Oral QHS  . pravastatin  40 mg Oral QPM   Continuous Infusions:  Assessment/Plan:  1. Atrial fibrillation with rapid ventricular response.  Patient was on Cardizem drip this morning and tapered off and started on metoprolol.  We will have to add low-dose Cardizem orally because heart rate still high.  Lovenox switched over to Eliquis.  Echocardiogram. 2. Morbid obesity with a BMI 42.97.  Weight loss needed 3. 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 4. Depression on Cymbalta        Code Status:     Code Status Orders  (From admission, onward)  Start     Ordered   06/16/20 2301  Full code  Continuous        06/16/20 2304        Code Status History    This patient has a current code status but no historical code status.   Advance Care Planning Activity     Family Communication: Spoke with husband on the phone Disposition Plan: Status is: Inpatient  Dispo: The patient is from: Home              Anticipated d/c is to: Home              Anticipated d/c date is: Potentially 06/18/2020 versus 06/19/2020 depending on heart rate              Patient currently not medically stable for discharge today  Consultants:  Cardiology  Time spent: 28 minutes  [place]

## 2020-06-17 NOTE — Progress Notes (Signed)
ANTICOAGULATION CONSULT NOTE - Initial Consult  Pharmacy Consult for Lovenox  Indication: atrial fibrillation  Allergies  Allergen Reactions  . Betadine [Povidone Iodine] Anaphylaxis  . Contrast Media [Iodinated Diagnostic Agents] Anaphylaxis  . Iodine Anaphylaxis  . Metrizamide Anaphylaxis  . Povidone-Iodine Anaphylaxis  . Shellfish Allergy Anaphylaxis  . Hydrocodone-Acetaminophen Nausea Only    Patient Measurements: Height: 5' (152.4 cm) Weight: 99.8 kg (220 lb) IBW/kg (Calculated) : 45.5 Heparin Dosing Weight:   Vital Signs: Temp: 98.4 F (36.9 C) (12/01 2154) Temp Source: Oral (12/01 2154) BP: 105/59 (12/01 2330) Pulse Rate: 111 (12/01 2330)  Labs: Recent Labs    06/16/20 2154  HGB 12.8  HCT 38.9  PLT 321  CREATININE 1.02*  TROPONINIHS 5    Estimated Creatinine Clearance: 56 mL/min (A) (by C-G formula based on SCr of 1.02 mg/dL (H)).   Medical History: Past Medical History:  Diagnosis Date  . Agatston coronary artery calcium score greater than 400   . Diabetes mellitus ([place])    x 3 years  . DJD (degenerative joint disease)   . Encephalitis   . Fibromyalgia   . HTN (hypertension)   . Hyperlipidemia   . Sleep apnea    No CPAP    Medications:  (Not in a hospital admission)   Assessment: Pharmacy consulted to dose lovenox for new onset Afib in this 69 year old female.  CrCl = 56 ml/min No prior anticoag noted.   Goal of Therapy:  prevention of thromboembolism Monitor platelets by anticoagulation protocol: Yes   Plan:  Lovenox 100 mg SQ Q12H ordered to start on 12/2 @ ~ 0000.  Will check CBC daily.   Korrey Schleicher D 06/17/2020,12:03 AM

## 2020-06-17 NOTE — ED Notes (Signed)
Pt's HR sustaining 115-120, carizem gtt increased per order. Sats 93% on 3L. Pt tolerating well and states, "I'm breathing better now." Will continue to monitor, call light in reach.

## 2020-06-17 NOTE — ED Notes (Signed)
Pt co upper abd pain states under breasts bilat, rates it 10/10 at this time. Med given as ordered.

## 2020-06-17 NOTE — ED Notes (Signed)
Pt called out using call bell. Pt given ice chips and a pillow per request. HOB moved down per request. Denies further needs at this time.

## 2020-06-18 DIAGNOSIS — D6869 Other thrombophilia: Secondary | ICD-10-CM

## 2020-06-18 LAB — CBC
HCT: 36 % (ref 36.0–46.0)
Hemoglobin: 11.8 g/dL — ABNORMAL LOW (ref 12.0–15.0)
MCH: 30.9 pg (ref 26.0–34.0)
MCHC: 32.8 g/dL (ref 30.0–36.0)
MCV: 94.2 fL (ref 80.0–100.0)
Platelets: 264 10*3/uL (ref 150–400)
RBC: 3.82 MIL/uL — ABNORMAL LOW (ref 3.87–5.11)
RDW: 14 % (ref 11.5–15.5)
WBC: 13.8 10*3/uL — ABNORMAL HIGH (ref 4.0–10.5)
nRBC: 0 % (ref 0.0–0.2)

## 2020-06-18 LAB — GLUCOSE, CAPILLARY
Glucose-Capillary: 113 mg/dL — ABNORMAL HIGH (ref 70–99)
Glucose-Capillary: 117 mg/dL — ABNORMAL HIGH (ref 70–99)
Glucose-Capillary: 93 mg/dL (ref 70–99)
Glucose-Capillary: 97 mg/dL (ref 70–99)

## 2020-06-18 MED ORDER — DILTIAZEM HCL 30 MG PO TABS
60.0000 mg | ORAL_TABLET | Freq: Four times a day (QID) | ORAL | Status: DC
  Administered 2020-06-18 (×4): 60 mg via ORAL
  Filled 2020-06-18 (×4): qty 2

## 2020-06-18 MED ORDER — SOTALOL HCL 80 MG PO TABS
80.0000 mg | ORAL_TABLET | Freq: Two times a day (BID) | ORAL | Status: DC
  Administered 2020-06-18 – 2020-06-19 (×2): 80 mg via ORAL
  Filled 2020-06-18 (×4): qty 1

## 2020-06-18 MED ORDER — POTASSIUM CHLORIDE CRYS ER 20 MEQ PO TBCR
40.0000 meq | EXTENDED_RELEASE_TABLET | Freq: Once | ORAL | Status: AC
  Administered 2020-06-18: 40 meq via ORAL
  Filled 2020-06-18: qty 2

## 2020-06-18 NOTE — Progress Notes (Signed)
Morning dose of sotalol not given.  Metoprolol was given prior to being discontinued.    Ok per cardiologist to not give AM dose and start this evening.

## 2020-06-18 NOTE — Progress Notes (Signed)
Patient ID: Tammy [name], female   DOB: 06/21/1951, 69 y.o.   MRN: [id number] [facility] PROGRESS NOTE  Tammy [name] YKZ:[id number] DOB: [birth year] DOA: 06/16/2020 PCP: Tammy Redwood, MD  HPI/Subjective: Patient feeling better today.  Offers no complaints.  No chest pain or shortness of breath.  Heart rate this morning still fast.  Does not feel any palpitations.  Objective: Vitals:   06/18/20 0757 06/18/20 1134  BP: (!) 105/92 102/62  Pulse: (!) 109 75  Resp: 17 16  Temp: 98.6 F (37 C) 98.2 F (36.8 C)  SpO2: 94% 91%    Intake/Output Summary (Last 24 hours) at 06/18/2020 1300 Last data filed at 06/18/2020 0600 Gross per 24 hour  Intake 361.76 ml  Output 1100 ml  Net -738.24 ml   Filed Weights   06/16/20 2149 06/17/20 2050 06/18/20 0432  Weight: 99.8 kg 104 kg 102.8 kg    ROS: Review of Systems  Respiratory: Negative for cough and shortness of breath.   Cardiovascular: Negative for chest pain.  Gastrointestinal: Negative for abdominal pain, nausea and vomiting.   Exam: Physical Exam HENT:     Head: Normocephalic.     Mouth/Throat:     Pharynx: No oropharyngeal exudate.  Eyes:     General: Lids are normal.     Conjunctiva/sclera: Conjunctivae normal.     Pupils: Pupils are equal, round, and reactive to light.  Cardiovascular:     Rate and Rhythm: Tachycardia present. Rhythm irregularly irregular.     Heart sounds: Normal heart sounds, S1 normal and S2 normal.  Pulmonary:     Breath sounds: No decreased breath sounds, wheezing, rhonchi or rales.  Abdominal:     Palpations: Abdomen is soft.     Tenderness: There is no abdominal tenderness.  Musculoskeletal:     Right lower leg: Swelling present.     Left lower leg: Swelling present.  Skin:    General: Skin is warm.     Findings: No rash.  Neurological:     Mental Status: She is alert and oriented to person, place, and time.       Data Reviewed: Basic Metabolic Panel: Recent Labs   Lab 06/16/20 2154 06/17/20 0450  NA 138 136  K 3.5 3.6  CL 101 101  CO2 26 24  GLUCOSE 113* 143*  BUN 28* 27*  CREATININE 1.02* 0.85  CALCIUM 9.2 9.1  MG 1.9  --    Liver Function Tests: Recent Labs  Lab 06/16/20 2154  AST 17  ALT 14  ALKPHOS 63  BILITOT 1.0  PROT 7.4  ALBUMIN 4.0   CBC: Recent Labs  Lab 06/16/20 2154 06/17/20 0450 06/18/20 0434  WBC 16.7* 14.9* 13.8*  NEUTROABS 10.8*  --   --   HGB 12.8 11.9* 11.8*  HCT 38.9 36.5 36.0  MCV 94.0 94.3 94.2  PLT 321 279 264   BNP (last 3 results) Recent Labs    06/16/20 2154  BNP 263.3*     CBG: Recent Labs  Lab 06/17/20 0853 06/17/20 1154 06/17/20 2336 06/18/20 0757 06/18/20 1134  GLUCAP 141* 115* 118* 113* 117*    Recent Results (from the past 240 hour(s))  Resp Panel by RT-PCR (Flu A&B, Covid) Nasopharyngeal Swab     Status: None   Collection Time: 06/16/20 10:16 PM   Specimen: Nasopharyngeal Swab; Nasopharyngeal(NP) swabs in vial transport medium  Result Value Ref Range Status   SARS Coronavirus 2 by RT PCR NEGATIVE NEGATIVE Final    Comment: (  NOTE) SARS-CoV-2 target nucleic acids are NOT DETECTED.  The SARS-CoV-2 RNA is generally detectable in upper respiratory specimens during the acute phase of infection. The lowest concentration of SARS-CoV-2 viral copies this assay can detect is 138 copies/mL. A negative result does not preclude SARS-Cov-2 infection and should not be used as the sole basis for treatment or other patient management decisions. A negative result may occur with  improper specimen collection/handling, submission of specimen other than nasopharyngeal swab, presence of viral mutation(s) within the areas targeted by this assay, and inadequate number of viral copies(<138 copies/mL). A negative result must be combined with clinical observations, patient history, and epidemiological information. The expected result is Negative.  Fact Sheet for Patients:   EntrepreneurPulse.com.au  Fact Sheet for Healthcare Providers:  IncredibleEmployment.be  This test is no t yet approved or cleared by the [place] FDA and  has been authorized for detection and/or diagnosis of SARS-CoV-2 by FDA under an Emergency Use Authorization (EUA). This EUA will remain  in effect (meaning this test can be used) for the duration of the COVID-19 declaration under Section 564(b)(1) of the Act, 21 U.S.C.section 360bbb-3(b)(1), unless the authorization is terminated  or revoked sooner.       Influenza A by PCR NEGATIVE NEGATIVE Final   Influenza B by PCR NEGATIVE NEGATIVE Final    Comment: (NOTE) The Xpert Xpress SARS-CoV-2/FLU/RSV plus assay is intended as an aid in the diagnosis of influenza from Nasopharyngeal swab specimens and should not be used as a sole basis for treatment. Nasal washings and aspirates are unacceptable for Xpert Xpress SARS-CoV-2/FLU/RSV testing.  Fact Sheet for Patients: EntrepreneurPulse.com.au  Fact Sheet for Healthcare Providers: IncredibleEmployment.be  This test is not yet approved or cleared by the [place] FDA and has been authorized for detection and/or diagnosis of SARS-CoV-2 by FDA under an Emergency Use Authorization (EUA). This EUA will remain in effect (meaning this test can be used) for the duration of the COVID-19 declaration under Section 564(b)(1) of the Act, 21 U.S.C. section 360bbb-3(b)(1), unless the authorization is terminated or revoked.  Performed at [facility], [address] [place], [place] [address]      Studies: DG Chest Portable 1 View  Result Date: 06/16/2020 CLINICAL DATA:  Chest pain, dyspnea EXAM: PORTABLE CHEST 1 VIEW COMPARISON:  05/17/2020 FINDINGS: Lungs are well expanded, symmetric, and clear. No pneumothorax or pleural effusion. Cardiac size within normal limits. Pulmonary vascularity is normal.  Osseous structures are age-appropriate. No acute bone abnormality. IMPRESSION: No active disease. Electronically Signed   By: Tammy Salisbury MD   On: 06/16/2020 22:25   ECHOCARDIOGRAM COMPLETE  Result Date: 06/17/2020    ECHOCARDIOGRAM REPORT   Patient Name:   Tammy [name] [name] Date of Exam: 06/17/2020 Medical Rec #:  [id number]            Height:       60.0 in Accession #:    [id number]           Weight:       220.0 lb Date of Birth:  18-Apr-1951           BSA:          1.944 m Patient Age:    45 years             BP:           100/67 mmHg Patient Gender: F  HR:           94 bpm. Exam Location:  [facility] Procedure: 2D Echo, Cardiac Doppler and Color Doppler Indications:     Atrial Fibrillation 427.31  History:         Patient has no prior history of Echocardiogram examinations.                  Risk Factors:Hypertension, Dyslipidemia and Diabetes.  Sonographer:     Sherrie Sport RDCS (AE) Referring Phys:  [id number] [place] Diagnosing Phys: Neoma Laming MD IMPRESSIONS  1. Left ventricular ejection fraction, by estimation, is 50 to 55%. The left ventricle has low normal function. The left ventricle has no regional wall motion abnormalities. The left ventricular internal cavity size was mildly dilated. There is mild left ventricular hypertrophy. Left ventricular diastolic parameters are consistent with Grade I diastolic dysfunction (impaired relaxation).  2. Right ventricular systolic function is normal. The right ventricular size is mildly enlarged.  3. Left atrial size was moderately dilated.  4. Right atrial size was mild to moderately dilated.  5. The mitral valve is normal in structure. No evidence of mitral valve regurgitation. No evidence of mitral stenosis.  6. The aortic valve is normal in structure. Aortic valve regurgitation is not visualized. No aortic stenosis is present.  7. The inferior vena cava is normal in size with greater than 50% respiratory variability, suggesting right  atrial pressure of 3 mmHg. FINDINGS  Left Ventricle: Left ventricular ejection fraction, by estimation, is 50 to 55%. The left ventricle has low normal function. The left ventricle has no regional wall motion abnormalities. The left ventricular internal cavity size was mildly dilated. There is mild left ventricular hypertrophy. Left ventricular diastolic parameters are consistent with Grade I diastolic dysfunction (impaired relaxation). Right Ventricle: The right ventricular size is mildly enlarged. No increase in right ventricular wall thickness. Right ventricular systolic function is normal. Left Atrium: Left atrial size was moderately dilated. Right Atrium: Right atrial size was mild to moderately dilated. Pericardium: There is no evidence of pericardial effusion. Mitral Valve: The mitral valve is normal in structure. No evidence of mitral valve regurgitation. No evidence of mitral valve stenosis. Tricuspid Valve: The tricuspid valve is normal in structure. Tricuspid valve regurgitation is trivial. No evidence of tricuspid stenosis. Aortic Valve: The aortic valve is normal in structure. Aortic valve regurgitation is not visualized. No aortic stenosis is present. Aortic valve mean gradient measures 4.0 mmHg. Aortic valve peak gradient measures 7.4 mmHg. Aortic valve area, by VTI measures 1.66 cm. Pulmonic Valve: The pulmonic valve was normal in structure. Pulmonic valve regurgitation is not visualized. No evidence of pulmonic stenosis. Aorta: The aortic root is normal in size and structure. Venous: The inferior vena cava is normal in size with greater than 50% respiratory variability, suggesting right atrial pressure of 3 mmHg. IAS/Shunts: No atrial level shunt detected by color flow Doppler.  LEFT VENTRICLE PLAX 2D LVIDd:         4.84 cm  Diastology LVIDs:         3.47 cm  LV e' medial:    9.14 cm/s LV PW:         1.23 cm  LV E/e' medial:  11.8 LV IVS:        0.92 cm  LV e' lateral:   10.00 cm/s LVOT diam:      2.00 cm  LV E/e' lateral: 10.8 LV SV:         42 LV  SV Index:   21 LVOT Area:     3.14 cm  RIGHT VENTRICLE RV Basal diam:  2.90 cm RV S prime:     8.49 cm/s TAPSE (M-mode): 3.5 cm LEFT ATRIUM             Index       RIGHT ATRIUM           Index LA diam:        4.30 cm 2.21 cm/m  RA Area:     21.70 cm LA Vol (A2C):   82.7 ml 42.55 ml/m RA Volume:   62.70 ml  32.26 ml/m LA Vol (A4C):   80.5 ml 41.41 ml/m LA Biplane Vol: 82.5 ml 42.44 ml/m  AORTIC VALVE                   PULMONIC VALVE AV Area (Vmax):    1.42 cm    PV Vmax:        0.66 m/s AV Area (Vmean):   1.40 cm    PV Peak grad:   1.7 mmHg AV Area (VTI):     1.66 cm    RVOT Peak grad: 2 mmHg AV Vmax:           136.00 cm/s AV Vmean:          90.200 cm/s AV VTI:            0.251 m AV Peak Grad:      7.4 mmHg AV Mean Grad:      4.0 mmHg LVOT Vmax:         61.30 cm/s LVOT Vmean:        40.200 cm/s LVOT VTI:          0.133 m LVOT/AV VTI ratio: 0.53  AORTA Ao Root diam: 2.60 cm MITRAL VALVE MV Area (PHT): 5.38 cm     SHUNTS MV Decel Time: 141 msec     Systemic VTI:  0.13 m MV E velocity: 108.00 cm/s  Systemic Diam: 2.00 cm Neoma Laming MD Electronically signed by Neoma Laming MD Signature Date/Time: 06/17/2020/4:05:43 PM    Final     Scheduled Meds: . apixaban  5 mg Oral BID  . aspirin EC  81 mg Oral Daily  . diltiazem  60 mg Oral QID  . DULoxetine  60 mg Oral Daily  . fesoterodine  4 mg Oral Daily  . fluorouracil   Topical BID  . gabapentin  300 mg Oral QHS  . insulin aspart  0-20 Units Subcutaneous TID WC  . multivitamin-lutein  1 capsule Oral Daily  . phentermine  37.5 mg Oral QHS  . pravastatin  40 mg Oral QPM  . sotalol  80 mg Oral Q12H    Assessment/Plan:  1. Atrial fibrillation with rapid ventricular response on presentation.  Currently persistent.  Case discussed with cardiology and they will start sotalol 80 mg twice a day.  Will get EKG's and follow.  Continue Cardizem and get rid of metoprolol.  Continue Eliquis for  anticoagulation.  Risk of blood thinner explained to patient and husband at the bedside. 2. Acquired thrombophilia.  Stroke risk higher being that she has atrial fibrillation.  Eliquis to reduce risk of stroke. 3. Morbid obesity with BMI 44.27. 4. 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  Since hemoglobin A1c low at 5.6.  Can probably hold off on Metformin as outpatient. 5. Depression on Cymbalta 6. Asked nurse to taper off oxygen.  Code Status:     Code Status Orders  (From admission, onward)         Start     Ordered   06/16/20 2301  Full code  Continuous        06/16/20 2304        Code Status History    This patient has a current code status but no historical code status.   Advance Care Planning Activity     Family Communication: Spoke with husband at the bedside Disposition Plan: Status is: Inpatient  Dispo: The patient is from: Home              Anticipated d/c is to: Home              Anticipated d/c date is: Potentially tomorrow if everything goes well with the sotalol and heart rate improves              Patient currently just started on sotalol today.  Monitor EKGs and telemetry monitoring.  Consultants:  Cardiology  Time spent: 28 minutes  [place]

## 2020-06-18 NOTE — Care Management Important Message (Signed)
Important Message  Patient Details  Name: Tammy [name] MRN: [id number] Date of Birth: 24-Mar-1951   Medicare Important Message Given:  N/A - LOS <3 / Initial given by admissions  Initial Medicare IM reviewed with patient via room phone by Meredith Mody, Patient Access Associate on 06/18/2020 at 8:56am.   Dannette Barbara 06/18/2020, 10:25 AM

## 2020-06-18 NOTE — Plan of Care (Signed)

## 2020-06-18 NOTE — Progress Notes (Signed)
SUBJECTIVE: Patient resting comfortably on the side of the bed. Denies shortness of breath but continues to require small amount of supplemental oxygen. Denies chest pain. States her epigastric pain from yesterday has resolved. Denies dizziness or heart palpitations.   Vitals:   06/18/20 0109 06/18/20 0111 06/18/20 0432 06/18/20 0757  BP: 105/72 105/72 105/65 (!) 105/92  Pulse:  (!) 113 (!) 105 (!) 109  Resp:  19 19 17   Temp:  98.1 F (36.7 C) 97.6 F (36.4 C) 98.6 F (37 C)  TempSrc:  Oral Oral Oral  SpO2:  94% 92% 94%  Weight:   102.8 kg   Height:        Intake/Output Summary (Last 24 hours) at 06/18/2020 1108 Last data filed at 06/18/2020 0600 Gross per 24 hour  Intake 361.76 ml  Output 1100 ml  Net -738.24 ml    LABS: Basic Metabolic Panel: Recent Labs    06/16/20 2154 06/17/20 0450  NA 138 136  K 3.5 3.6  CL 101 101  CO2 26 24  GLUCOSE 113* 143*  BUN 28* 27*  CREATININE 1.02* 0.85  CALCIUM 9.2 9.1  MG 1.9  --    Liver Function Tests: Recent Labs    06/16/20 2154  AST 17  ALT 14  ALKPHOS 63  BILITOT 1.0  PROT 7.4  ALBUMIN 4.0   No results for input(s): LIPASE, AMYLASE in the last 72 hours. CBC: Recent Labs    06/16/20 2154 06/16/20 2154 06/17/20 0450 06/18/20 0434  WBC 16.7*   < > 14.9* 13.8*  NEUTROABS 10.8*  --   --   --   HGB 12.8   < > 11.9* 11.8*  HCT 38.9   < > 36.5 36.0  MCV 94.0   < > 94.3 94.2  PLT 321   < > 279 264   < > = values in this interval not displayed.   Cardiac Enzymes: No results for input(s): CKTOTAL, CKMB, CKMBINDEX, TROPONINI in the last 72 hours. BNP: Invalid input(s): POCBNP D-Dimer: No results for input(s): DDIMER in the last 72 hours. Hemoglobin A1C: Recent Labs    06/17/20 0014  HGBA1C 5.6   Fasting Lipid Panel: Recent Labs    06/17/20 0450  CHOL 143  HDL 62  LDLCALC 71  TRIG 50  CHOLHDL 2.3   Thyroid Function Tests: Recent Labs    06/17/20 0014  TSH 1.454   Anemia Panel: No results for  input(s): VITAMINB12, FOLATE, FERRITIN, TIBC, IRON, RETICCTPCT in the last 72 hours.   PHYSICAL EXAM General: Well developed, well nourished, in no acute distress HEENT:  Normocephalic and atramatic Neck:  No JVD.  Lungs: Clear bilaterally to auscultation and percussion. Heart: Irregularly irregular.  Abdomen: Bowel sounds are positive, abdomen soft and non-tender  Msk:  Back normal, normal gait. Normal strength and tone for age. Extremities: No clubbing, cyanosis or edema.   Neuro: Alert and oriented X 3. Psych:  Good affect, responds appropriately  TELEMETRY: Atrial Fibrillation. 106/bpm  ASSESSMENT AND PLAN: Patient presenting to the hospital with chest discomfort and dyspnea found to have Atrial fibrillation with RVR. Patient currently rate controlled in low 100s with oral metoprolol tartrate and diltiazem. Echo with EF 50-55% with Grade I diastolic dysfunction and no other significant abnormalities. Plan to Sotalol 80mg  bid. Patient already received metoprolol this AM so will begin Sotalol this afternoon. EKG this morning with stable QTc. Continue eliquis. Further supplemental rate control with diltiazem increased this AM by hospitalist to 60mg  qid.  Will continue to follow.  Principal Problem:   Atrial fibrillation with RVR (HCC) Active Problems:   Diabetes mellitus ([place])   HTN (hypertension)   Sleep apnea   Obesity, Class III, BMI 40-49.9 (morbid obesity) ([place])   Chart review, assessment, and plan development time spent, 35 minutes  Adaline Sill, NP-C 06/18/2020 11:08 AM

## 2020-06-18 NOTE — Progress Notes (Signed)
Spoke to Pharmacist-  Wants EKGs done 2 hours post sotalol dosage (x5 )  Did not give this morning dose, so will hold off on the 1 pm EKG

## 2020-06-19 DIAGNOSIS — Z7982 Long term (current) use of aspirin: Secondary | ICD-10-CM | POA: Diagnosis not present

## 2020-06-19 LAB — GLUCOSE, CAPILLARY
Glucose-Capillary: 108 mg/dL — ABNORMAL HIGH (ref 70–99)
Glucose-Capillary: 119 mg/dL — ABNORMAL HIGH (ref 70–99)

## 2020-06-19 LAB — BASIC METABOLIC PANEL
Anion gap: 9 (ref 5–15)
BUN: 25 mg/dL — ABNORMAL HIGH (ref 8–23)
CO2: 25 mmol/L (ref 22–32)
Calcium: 9.1 mg/dL (ref 8.9–10.3)
Chloride: 101 mmol/L (ref 98–111)
Creatinine, Ser: 0.7 mg/dL (ref 0.44–1.00)
GFR, Estimated: >60 mL/min (ref >60)
Glucose, Bld: 105 mg/dL — ABNORMAL HIGH (ref 70–99)
Potassium: 3.7 mmol/L (ref 3.5–5.1)
Sodium: 135 mmol/L (ref 135–145)

## 2020-06-19 LAB — MAGNESIUM: Magnesium: 2 mg/dL (ref 1.7–2.4)

## 2020-06-19 LAB — HIV ANTIBODY (ROUTINE TESTING W REFLEX): HIV Screen 4th Generation wRfx: NONREACTIVE

## 2020-06-19 MED ORDER — POTASSIUM CHLORIDE CRYS ER 20 MEQ PO TBCR
40.0000 meq | EXTENDED_RELEASE_TABLET | Freq: Once | ORAL | Status: AC
  Administered 2020-06-19: 40 meq via ORAL
  Filled 2020-06-19: qty 2

## 2020-06-19 MED ORDER — DILTIAZEM HCL ER COATED BEADS 180 MG PO CP24: 180.0000 mg | ORAL_CAPSULE | Freq: Every day | ORAL | Status: DC

## 2020-06-19 MED ORDER — APIXABAN 5 MG PO TABS: 5.0000 mg | ORAL_TABLET | Freq: Two times a day (BID) | ORAL | 0 refills | Status: DC

## 2020-06-19 MED ORDER — DILTIAZEM HCL ER COATED BEADS 240 MG PO CP24: 240.0000 mg | ORAL_CAPSULE | Freq: Every day | ORAL | 0 refills | Status: DC

## 2020-06-19 MED ORDER — SOTALOL HCL 80 MG PO TABS: 80.0000 mg | ORAL_TABLET | Freq: Two times a day (BID) | ORAL | 0 refills | Status: AC

## 2020-06-19 MED ORDER — DILTIAZEM HCL ER COATED BEADS 120 MG PO CP24
240.0000 mg | ORAL_CAPSULE | Freq: Every day | ORAL | Status: DC
  Administered 2020-06-19: 240 mg via ORAL
  Filled 2020-06-19: qty 2

## 2020-06-19 NOTE — Discharge Summary (Signed)
[facility] [place] NAME: Tammy [name]    MR#:  [id number]  DATE OF BIRTH:  [birth year]  DATE OF ADMISSION:  06/16/2020 ADMITTING PHYSICIAN: Collier Bullock, MD  DATE OF DISCHARGE: 06/19/2020  3:33 PM  PRIMARY CARE PHYSICIAN: [name], MD    ADMISSION DIAGNOSIS:  New onset atrial fibrillation ([place]) [I48.91] Atrial fibrillation with RVR ([place]) [I48.91] Nonspecific chest pain [R07.9]  DISCHARGE DIAGNOSIS:  Principal Problem:   Atrial fibrillation with RVR ([place]) Active Problems:   Diabetes mellitus (HCC)   HTN (hypertension)   Sleep apnea   Obesity, Class III, BMI 40-49.9 (morbid obesity) ([place])   Acquired thrombophilia ([place])   SECONDARY DIAGNOSIS:   Past Medical History:  Diagnosis Date  . Agatston coronary artery calcium score greater than 400   . Diabetes mellitus ([place])    x 3 years  . DJD (degenerative joint disease)   . Encephalitis   . Fibromyalgia   . HTN (hypertension)   . Hyperlipidemia   . Sleep apnea    No CPAP    HOSPITAL COURSE:   1.  Atrial fibrillation with rapid ventricular response on presentation.  Currently persistent and did not convert to sinus rhythm at this point.  Initially we tried Cardizem drip and then switched over to oral Cardizem.  We added beta-blocker but cardiology wanted to switch over to sotalol so beta-blocker was discontinued and sotalol 80 mg twice a day continued.  Patient be discharged home on 80 mg of sotalol twice daily and Cardizem CD 240 mg daily.  Patient also on Eliquis for anticoagulation.  Follow-up with Dr. Humphrey Rolls cardiology Monday at 9 AM. 2.  Acquired thrombophilia.  Stroke risk higher being that she has atrial fibrillation.  Eliquis to reduce stroke risk. 3.  Morbid obesity with a BMI of 43.18.  Recommend checking a sleep study as outpatient.  Patient did have some borderline saturations overnight of 88%. 4.  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   Hemoglobin A1c is okay at 5.6 [facility] consider cutting back on Metformin as outpatient. 5.  Depression on Cymbalta 6.  I personally walked the patient around the room and the patient held her saturations in the 90s with ambulation.  DISCHARGE CONDITIONS:   Satisfactory  CONSULTS OBTAINED:  Cardiology  DRUG ALLERGIES:   Allergies  Allergen Reactions  . Betadine [Povidone Iodine] Anaphylaxis  . Contrast Media [Iodinated Diagnostic Agents] Anaphylaxis  . Iodine Anaphylaxis  . Metrizamide Anaphylaxis  . Povidone-Iodine Anaphylaxis  . Shellfish Allergy Anaphylaxis  . Hydrocodone-Acetaminophen Nausea Only    DISCHARGE MEDICATIONS:   Allergies as of 06/19/2020      Reactions   Betadine [povidone Iodine] Anaphylaxis   Contrast Media [iodinated Diagnostic Agents] Anaphylaxis   Iodine Anaphylaxis   Metrizamide Anaphylaxis   Povidone-iodine Anaphylaxis   Shellfish Allergy Anaphylaxis   Hydrocodone-acetaminophen Nausea Only      Medication List    STOP taking these medications   amLODipine-valsartan 10-320 MG tablet Commonly known as: EXFORGE   aspirin 81 MG tablet   valsartan-hydrochlorothiazide 320-25 MG tablet Commonly known as: DIOVAN-HCT     TAKE these medications   apixaban 5 MG Tabs tablet Commonly known as: ELIQUIS Take 1 tablet (5 mg total) by mouth 2 (two) times daily.   beta carotene w/minerals tablet Take 1 tablet by mouth daily.   BYDUREON [place] Inject into the skin once a week.   diltiazem 240 MG 24 hr capsule Commonly known as: CARDIZEM CD  Take 1 capsule (240 mg total) by mouth daily. Start taking on: June 20, 2020   DSS 100 MG Caps Take 100 mg by mouth once a week.   DULoxetine 60 MG capsule Commonly known as: CYMBALTA Take 60 mg by mouth daily.   EPINEPHrine 0.3 mg/0.3 mL Soaj injection Commonly known as: EPI-PEN Inject 0.3 mg into the muscle as needed.   fesoterodine 4 MG Tb24 tablet Commonly known as: TOVIAZ Take 4 mg by mouth daily.    fluorouracil 5 % cream Commonly known as: EFUDEX Apply topically.   metFORMIN 1000 MG tablet Commonly known as: GLUCOPHAGE Take 1,000 mg by mouth daily with breakfast.   naproxen sodium 220 MG tablet Commonly known as: ALEVE Take 440 mg by mouth daily as needed.   phentermine 37.5 MG tablet Commonly known as: ADIPEX-P Take 37.5 mg by mouth daily before breakfast.   pravastatin 40 MG tablet Commonly known as: PRAVACHOL Take 1 tablet (40 mg total) by mouth every evening.   Repatha SureClick 875 MG/ML Soaj Generic drug: Evolocumab Inject 1 Dose into the skin every 14 (fourteen) days.   sotalol 80 MG tablet Commonly known as: BETAPACE Take 1 tablet (80 mg total) by mouth every 12 (twelve) hours.   Valtrex 500 MG tablet Generic drug: valACYclovir Take 500 mg by mouth daily.        DISCHARGE INSTRUCTIONS:   Follow-up with PMD 5 days Follow-up Dr. Humphrey Rolls cardiology on Monday at 9 AM  If you experience worsening of your admission symptoms, develop shortness of breath, life threatening emergency, suicidal or homicidal thoughts you must seek medical attention immediately by calling 911 or calling your MD immediately  if symptoms less severe.  You Must read complete instructions/literature along with all the possible adverse reactions/side effects for all the Medicines you take and that have been prescribed to you. Take any new Medicines after you have completely understood and accept all the possible adverse reactions/side effects.   Please note  You were cared for by a hospitalist during your hospital stay. If you have any questions about your discharge medications or the care you received while you were in the hospital after you are discharged, you can call the unit and asked to speak with the hospitalist on call if the hospitalist that took care of you is not available. Once you are discharged, your primary care physician will handle any further medical issues. Please note  that NO REFILLS for any discharge medications will be authorized once you are discharged, as it is imperative that you return to your primary care physician (or establish a relationship with a primary care physician if you do not have one) for your aftercare needs so that they can reassess your need for medications and monitor your lab values.    Today   CHIEF COMPLAINT:   Chief Complaint  Patient presents with  . Chest Pain    HISTORY OF PRESENT ILLNESS:  Tammy [name]  is a 69 y.o. female came in with chest pain and shortness of breath   VITAL SIGNS:  Blood pressure 100/71, pulse 95, temperature 98.8 F (37.1 C), temperature source Oral, resp. rate 18, height 5' (1.524 m), weight 100.3 kg, SpO2 93 %.  I/O:    Intake/Output Summary (Last 24 hours) at 06/19/2020 1654 Last data filed at 06/19/2020 1059 Gross per 24 hour  Intake --  Output 1150 ml  Net -1150 ml     PHYSICAL EXAMINATION:  GENERAL:  69 y.o.-year-old patient lying in  the bed with no acute distress.  EYES: Pupils equal, round, reactive to light and accommodation. No scleral icterus. HEENT: Head atraumatic, normocephalic. Oropharynx and nasopharynx clear.  LUNGS: Normal breath sounds bilaterally, no wheezing, rales,rhonchi or crepitation. No use of accessory muscles of respiration.   CARDIOVASCULAR: S1, S2 normal. No murmurs, rubs, or gallops.  ABDOMEN: Soft, non-tender, non-distended. EXTREMITIES: No pedal edema.  NEUROLOGIC: Cranial nerves II through XII are intact. Muscle strength 5/5 in all extremities. Sensation intact. Gait not checked.  PSYCHIATRIC: The patient is alert and oriented x 3.  SKIN: No obvious rash, lesion, or ulcer.   DATA REVIEW:   CBC Recent Labs  Lab 06/18/20 0434  WBC 13.8*  HGB 11.8*  HCT 36.0  PLT 264    Chemistries  Recent Labs  Lab 06/16/20 2154 06/17/20 0450 06/19/20 0455  NA 138   < > 135  K 3.5   < > 3.7  CL 101   < > 101  CO2 26   < > 25  GLUCOSE 113*   < >  105*  BUN 28*   < > 25*  CREATININE 1.02*   < > 0.70  CALCIUM 9.2   < > 9.1  MG 1.9  --  2.0  AST 17  --   --   ALT 14  --   --   ALKPHOS 63  --   --   BILITOT 1.0  --   --    < > = values in this interval not displayed.    Microbiology Results  Results for orders placed or performed during the hospital encounter of 06/16/20  Resp Panel by RT-PCR (Flu A&B, Covid) Nasopharyngeal Swab     Status: None   Collection Time: 06/16/20 10:16 PM   Specimen: Nasopharyngeal Swab; Nasopharyngeal(NP) swabs in vial transport medium  Result Value Ref Range Status   SARS Coronavirus 2 by RT PCR NEGATIVE NEGATIVE Final    Comment: (NOTE) SARS-CoV-2 target nucleic acids are NOT DETECTED.  The SARS-CoV-2 RNA is generally detectable in upper respiratory specimens during the acute phase of infection. The lowest concentration of SARS-CoV-2 viral copies this assay can detect is 138 copies/mL. A negative result does not preclude SARS-Cov-2 infection and should not be used as the sole basis for treatment or other patient management decisions. A negative result may occur with  improper specimen collection/handling, submission of specimen other than nasopharyngeal swab, presence of viral mutation(s) within the areas targeted by this assay, and inadequate number of viral copies(<138 copies/mL). A negative result must be combined with clinical observations, patient history, and epidemiological information. The expected result is Negative.  Fact Sheet for Patients:  EntrepreneurPulse.com.au  Fact Sheet for Healthcare Providers:  IncredibleEmployment.be  This test is no t yet approved or cleared by the [place] FDA and  has been authorized for detection and/or diagnosis of SARS-CoV-2 by FDA under an Emergency Use Authorization (EUA). This EUA will remain  in effect (meaning this test can be used) for the duration of the COVID-19 declaration under Section 564(b)(1)  of the Act, 21 U.S.C.section 360bbb-3(b)(1), unless the authorization is terminated  or revoked sooner.       Influenza A by PCR NEGATIVE NEGATIVE Final   Influenza B by PCR NEGATIVE NEGATIVE Final    Comment: (NOTE) The Xpert Xpress SARS-CoV-2/FLU/RSV plus assay is intended as an aid in the diagnosis of influenza from Nasopharyngeal swab specimens and should not be used as a sole basis for treatment. Nasal washings  and aspirates are unacceptable for Xpert Xpress SARS-CoV-2/FLU/RSV testing.  Fact Sheet for Patients: EntrepreneurPulse.com.au  Fact Sheet for Healthcare Providers: IncredibleEmployment.be  This test is not yet approved or cleared by the [place] FDA and has been authorized for detection and/or diagnosis of SARS-CoV-2 by FDA under an Emergency Use Authorization (EUA). This EUA will remain in effect (meaning this test can be used) for the duration of the COVID-19 declaration under Section 564(b)(1) of the Act, 21 U.S.C. section 360bbb-3(b)(1), unless the authorization is terminated or revoked.  Performed at [facility], [address], [place], [place] [address]      Management plans discussed with the patient, family and they are in agreement.  CODE STATUS:     Code Status Orders  (From admission, onward)         Start     Ordered   06/16/20 2301  Full code  Continuous        06/16/20 2304        Code Status History    This patient has a current code status but no historical code status.   Advance Care Planning Activity      TOTAL TIME TAKING CARE OF THIS PATIENT: 35 minutes.    Loletha Grayer M.D on 06/19/2020 at 4:54 PM  Between 7am to 6pm - Pager - [phone]  After 6pm go to [email or web address] - password [email or web address]  [facility]  CC: Primary care physician; [name], MD

## 2020-06-19 NOTE — Progress Notes (Signed)
This morning EKG showed still in afib with rate reasonable, QTC is 460, may continue sotalol for now. Can go home with f/u Monday 9 am and will repeat EKG in office.

## 2020-06-19 NOTE — Plan of Care (Signed)
  Problem: Education: Goal: Knowledge of disease or condition will improve Outcome: Adequate for Discharge Goal: Understanding of medication regimen will improve Outcome: Adequate for Discharge Goal: Individualized Educational Video(s) Outcome: Adequate for Discharge

## 2020-06-19 NOTE — Discharge Instructions (Signed)
Suggest overnight sleep study as outpatient  Atrial Fibrillation  Atrial fibrillation is a type of heartbeat that is irregular or fast. If you have this condition, your heart beats without any order. This makes it hard for your heart to pump blood in a normal way. Atrial fibrillation may come and go, or it may become a long-lasting problem. If this condition is not treated, it can put you at higher risk for stroke, heart failure, and other heart problems. What are the causes? This condition may be caused by diseases that damage the heart. They include:  High blood pressure.  Heart failure.  Heart valve disease.  Heart surgery. Other causes include:  Diabetes.  Thyroid disease.  Being overweight.  Kidney disease. Sometimes the cause is not known. What increases the risk? You are more likely to develop this condition if:  You are older.  You smoke.  You exercise often and very hard.  You have a family history of this condition.  You are a man.  You use drugs.  You drink a lot of alcohol.  You have lung conditions, such as emphysema, pneumonia, or COPD.  You have sleep apnea. What are the signs or symptoms? Common symptoms of this condition include:  A feeling that your heart is beating very fast.  Chest pain or discomfort.  Feeling short of breath.  Suddenly feeling light-headed or weak.  Getting tired easily during activity.  Fainting.  Sweating. In some cases, there are no symptoms. How is this treated? Treatment for this condition depends on underlying conditions and how you feel when you have atrial fibrillation. They include:  Medicines to: ? Prevent blood clots. ? Treat heart rate or heart rhythm problems.  Using devices, such as a pacemaker, to correct heart rhythm problems.  Doing surgery to remove the part of the heart that sends bad signals.  Closing an area where clots can form in the heart (left atrial appendage). In some cases,  your doctor will treat other underlying conditions. Follow these instructions at home: Medicines  Take over-the-counter and prescription medicines only as told by your doctor.  Do not take any new medicines without first talking to your doctor.  If you are taking blood thinners: ? Talk with your doctor before you take any medicines that have aspirin or NSAIDs, such as ibuprofen, in them. ? Take your medicine exactly as told by your doctor. Take it at the same time each day. ? Avoid activities that could hurt or bruise you. Follow instructions about how to prevent falls. ? Wear a bracelet that says you are taking blood thinners. Or, carry a card that lists what medicines you take. Lifestyle      Do not use any products that have nicotine or tobacco in them. These include cigarettes, e-cigarettes, and chewing tobacco. If you need help quitting, ask your doctor.  Eat heart-healthy foods. Talk with your doctor about the right eating plan for you.  Exercise regularly as told by your doctor.  Do not drink alcohol.  Lose weight if you are overweight.  Do not use drugs, including cannabis. General instructions  If you have a condition that causes breathing to stop for a short period of time (apnea), treat it as told by your doctor.  Keep a healthy weight. Do not use diet pills unless your doctor says they are safe for you. Diet pills may make heart problems worse.  Keep all follow-up visits as told by your doctor. This is important. Contact a  doctor if:  You notice a change in the speed, rhythm, or strength of your heartbeat.  You are taking a blood-thinning medicine and you get more bruising.  You get tired more easily when you move or exercise.  You have a sudden change in weight. Get help right away if:   You have pain in your chest or your belly (abdomen).  You have trouble breathing.  You have side effects of blood thinners, such as blood in your vomit, poop (stool),  or pee (urine), or bleeding that cannot stop.  You have any signs of a stroke. "BE FAST" is an easy way to remember the main warning signs: ? B - Balance. Signs are dizziness, sudden trouble walking, or loss of balance. ? E - Eyes. Signs are trouble seeing or a change in how you see. ? F - Face. Signs are sudden weakness or loss of feeling in the face, or the face or eyelid drooping on one side. ? A - Arms. Signs are weakness or loss of feeling in an arm. This happens suddenly and usually on one side of the body. ? S - Speech. Signs are sudden trouble speaking, slurred speech, or trouble understanding what people say. ? T - Time. Time to call emergency services. Write down what time symptoms started.  You have other signs of a stroke, such as: ? A sudden, very bad headache with no known cause. ? Feeling like you may vomit (nausea). ? Vomiting. ? A seizure. These symptoms may be an emergency. Do not wait to see if the symptoms will go away. Get medical help right away. Call your local emergency services (911 in the [place] Do not drive yourself to the hospital. Summary  Atrial fibrillation is a type of heartbeat that is irregular or fast.  You are at higher risk of this condition if you smoke, are older, have diabetes, or are overweight.  Follow your doctor's instructions about medicines, diet, exercise, and follow-up visits.  Get help right away if you have signs or symptoms of a stroke.  Get help right away if you cannot catch your breath, or you have chest pain or discomfort. This information is not intended to replace advice given to you by your health care provider. Make sure you discuss any questions you have with your health care provider. Document Revised: 12/25/2018 Document Reviewed: 12/25/2018 Elsevier Patient Education  [place].

## 2020-06-19 NOTE — TOC Transition Note (Signed)
Transition of Care [facility]) - CM/SW Discharge Note   Patient Details  Name: Tammy [name] MRN: [id number] Date of Birth: 31-Jul-1950  Transition of Care [facility]) CM/SW Contact:  Izola Price, RN Phone Number: 06/19/2020, 4:26 PM   Clinical Narrative:   CM went to take Eliquis discount card but patient had already been discharged at 1430. Simmie Davies RN CM           Patient Goals and CMS Choice        Discharge Placement                       Discharge Plan and Services                                     Social Determinants of Health (SDOH) Interventions     Readmission Risk Interventions No flowsheet data found.

## 2020-06-21 ENCOUNTER — Telehealth: Payer: Self-pay | Admitting: Physical Medicine and Rehabilitation

## 2020-06-21 ENCOUNTER — Ambulatory Visit: Payer: Medicare Other | Admitting: Orthopaedic Surgery

## 2020-06-21 DIAGNOSIS — I480 Paroxysmal atrial fibrillation: Secondary | ICD-10-CM | POA: Diagnosis not present

## 2020-06-21 DIAGNOSIS — G4730 Sleep apnea, unspecified: Secondary | ICD-10-CM | POA: Diagnosis not present

## 2020-06-21 DIAGNOSIS — I4891 Unspecified atrial fibrillation: Secondary | ICD-10-CM | POA: Diagnosis not present

## 2020-06-21 DIAGNOSIS — R079 Chest pain, unspecified: Secondary | ICD-10-CM | POA: Diagnosis not present

## 2020-06-21 DIAGNOSIS — E782 Mixed hyperlipidemia: Secondary | ICD-10-CM | POA: Diagnosis not present

## 2020-06-21 DIAGNOSIS — R0602 Shortness of breath: Secondary | ICD-10-CM | POA: Diagnosis not present

## 2020-06-21 DIAGNOSIS — E669 Obesity, unspecified: Secondary | ICD-10-CM | POA: Diagnosis not present

## 2020-06-21 DIAGNOSIS — I10 Essential (primary) hypertension: Secondary | ICD-10-CM | POA: Diagnosis not present

## 2020-06-21 DIAGNOSIS — R9431 Abnormal electrocardiogram [ECG] [EKG]: Secondary | ICD-10-CM | POA: Diagnosis not present

## 2020-06-21 NOTE — Telephone Encounter (Signed)
Patient called needing an appointment with Dr Ernestina Patches for her back. The number to contact patient is [phone]

## 2020-06-22 ENCOUNTER — Encounter: Payer: Self-pay | Admitting: Physical Medicine and Rehabilitation

## 2020-06-22 NOTE — Telephone Encounter (Signed)
Left message #1 to reschedule appointment.

## 2020-06-23 ENCOUNTER — Encounter: Payer: Self-pay | Admitting: Physical Medicine and Rehabilitation

## 2020-06-23 ENCOUNTER — Ambulatory Visit: Payer: Self-pay

## 2020-06-23 ENCOUNTER — Ambulatory Visit (INDEPENDENT_AMBULATORY_CARE_PROVIDER_SITE_OTHER): Payer: Medicare Other | Admitting: Physical Medicine and Rehabilitation

## 2020-06-23 ENCOUNTER — Other Ambulatory Visit: Payer: Self-pay

## 2020-06-23 VITALS — BP 136/91 | HR 118

## 2020-06-23 DIAGNOSIS — M5416 Radiculopathy, lumbar region: Secondary | ICD-10-CM

## 2020-06-23 DIAGNOSIS — N181 Chronic kidney disease, stage 1: Secondary | ICD-10-CM | POA: Diagnosis not present

## 2020-06-23 MED ORDER — DEXAMETHASONE SODIUM PHOSPHATE 10 MG/ML IJ SOLN
15.0000 mg | Freq: Once | INTRAMUSCULAR | Status: AC
  Administered 2020-06-23: 15 mg

## 2020-06-23 NOTE — Procedures (Signed)
Lumbosacral Transforaminal Epidural Steroid Injection - Sub-Pedicular Approach with Fluoroscopic Guidance  Patient: Tammy [name]      Date of Birth: 1951/03/21 MRN: [id number] PCP: [name], MD      Visit Date: 06/23/2020   Universal Protocol:    Date/Time: 06/23/2020  Consent Given By: the patient  Position: PRONE  Additional Comments: Vital signs were monitored before and after the procedure. Patient was prepped and draped in the usual sterile fashion. The correct patient, procedure, and site was verified.   Injection Procedure Details:   Procedure diagnoses: Lumbar radiculopathy [M54.16]    Meds Administered:  Meds ordered this encounter  Medications  . dexamethasone (DECADRON) injection 15 mg    Laterality: Left  Location/Site:  L4-L5  Needle:5.0 in., 22 ga.  Short bevel or Quincke spinal needle  Needle Placement: Transforaminal  Findings:    -Comments: Excellent flow of contrast along the nerve, nerve root and into the epidural space.  Procedure Details: After squaring off the end-plates to get a true AP view, the C-arm was positioned so that an oblique view of the foramen as noted above was visualized. The target area is just inferior to the "nose of the scotty dog" or sub pedicular. The soft tissues overlying this structure were infiltrated with 2-3 ml. of 1% Lidocaine without Epinephrine.  The spinal needle was inserted toward the target using a "trajectory" view along the fluoroscope beam.  Under AP and lateral visualization, the needle was advanced so it did not puncture dura and was located close the 6 O'Clock position of the pedical in AP tracterory. Biplanar projections were used to confirm position. Aspiration was confirmed to be negative for CSF and/or blood. A 1-2 ml. volume of Omniscan (patient allergy to iodine and contrast) was injected and flow of contrast was noted at each level. Radiographs were obtained for documentation purposes.    After attaining the desired flow of contrast documented above, a 0.5 to 1.0 ml test dose of 0.25% Marcaine was injected into each respective transforaminal space.  The patient was observed for 90 seconds post injection.  After no sensory deficits were reported, and normal lower extremity motor function was noted,   the above injectate was administered so that equal amounts of the injectate were placed at each foramen (level) into the transforaminal epidural space.   Additional Comments:  The patient tolerated the procedure well Dressing: 2 x 2 sterile gauze and Band-Aid    Post-procedure details: Patient was observed during the procedure. Post-procedure instructions were reviewed.  Patient left the clinic in stable condition.

## 2020-06-23 NOTE — Progress Notes (Signed)
Pt state lower back pain that travel all down her left leg. Pt state walking and standing makes the pain worse. Pt state she has to lay down to help ease the pain.  Numeric Pain Rating Scale and Functional Assessment Average Pain 0   In the last MONTH (on 0-10 scale) has pain interfered with the following?  1. General activity like being  able to carry out your everyday physical activities such as walking, climbing stairs, carrying groceries, or moving a chair?  Rating(8)   +Driver, +BT, -Dye Allergies. Pt allergy to IV dye.

## 2020-06-25 DIAGNOSIS — R079 Chest pain, unspecified: Secondary | ICD-10-CM | POA: Diagnosis not present

## 2020-06-28 ENCOUNTER — Encounter: Payer: Self-pay | Admitting: Radiology

## 2020-06-28 ENCOUNTER — Other Ambulatory Visit: Payer: Self-pay

## 2020-06-28 ENCOUNTER — Ambulatory Visit: Payer: Self-pay | Admitting: Cardiovascular Disease

## 2020-06-28 ENCOUNTER — Inpatient Hospital Stay
Admission: EM | Admit: 2020-06-28 | Discharge: 2020-06-30 | DRG: 308 | Disposition: A | Discharge status: Home / Self Care | Payer: Medicare Other | Attending: Internal Medicine | Admitting: Internal Medicine

## 2020-06-28 ENCOUNTER — Emergency Department: Payer: Medicare Other

## 2020-06-28 DIAGNOSIS — Z87891 Personal history of nicotine dependence: Secondary | ICD-10-CM

## 2020-06-28 DIAGNOSIS — E119 Type 2 diabetes mellitus without complications: Secondary | ICD-10-CM | POA: Diagnosis not present

## 2020-06-28 DIAGNOSIS — F32A Depression, unspecified: Secondary | ICD-10-CM | POA: Diagnosis present

## 2020-06-28 DIAGNOSIS — Z91013 Allergy to seafood: Secondary | ICD-10-CM

## 2020-06-28 DIAGNOSIS — Z8249 Family history of ischemic heart disease and other diseases of the circulatory system: Secondary | ICD-10-CM | POA: Diagnosis not present

## 2020-06-28 DIAGNOSIS — I110 Hypertensive heart disease with heart failure: Secondary | ICD-10-CM | POA: Diagnosis not present

## 2020-06-28 DIAGNOSIS — Z7901 Long term (current) use of anticoagulants: Secondary | ICD-10-CM

## 2020-06-28 DIAGNOSIS — E785 Hyperlipidemia, unspecified: Secondary | ICD-10-CM | POA: Diagnosis not present

## 2020-06-28 DIAGNOSIS — G4730 Sleep apnea, unspecified: Secondary | ICD-10-CM | POA: Diagnosis present

## 2020-06-28 DIAGNOSIS — Z803 Family history of malignant neoplasm of breast: Secondary | ICD-10-CM | POA: Diagnosis not present

## 2020-06-28 DIAGNOSIS — Z884 Allergy status to anesthetic agent status: Secondary | ICD-10-CM | POA: Diagnosis not present

## 2020-06-28 DIAGNOSIS — Z7984 Long term (current) use of oral hypoglycemic drugs: Secondary | ICD-10-CM

## 2020-06-28 DIAGNOSIS — Z6841 Body Mass Index (BMI) 40.0 and over, adult: Secondary | ICD-10-CM | POA: Diagnosis not present

## 2020-06-28 DIAGNOSIS — Z888 Allergy status to other drugs, medicaments and biological substances status: Secondary | ICD-10-CM

## 2020-06-28 DIAGNOSIS — M797 Fibromyalgia: Secondary | ICD-10-CM | POA: Diagnosis present

## 2020-06-28 DIAGNOSIS — Z79899 Other long term (current) drug therapy: Secondary | ICD-10-CM | POA: Diagnosis not present

## 2020-06-28 DIAGNOSIS — R61 Generalized hyperhidrosis: Secondary | ICD-10-CM | POA: Diagnosis present

## 2020-06-28 DIAGNOSIS — J9600 Acute respiratory failure, unspecified whether with hypoxia or hypercapnia: Secondary | ICD-10-CM | POA: Diagnosis present

## 2020-06-28 DIAGNOSIS — I5031 Acute diastolic (congestive) heart failure: Secondary | ICD-10-CM | POA: Diagnosis not present

## 2020-06-28 DIAGNOSIS — I5022 Chronic systolic (congestive) heart failure: Secondary | ICD-10-CM | POA: Diagnosis not present

## 2020-06-28 DIAGNOSIS — Z91041 Radiographic dye allergy status: Secondary | ICD-10-CM | POA: Diagnosis not present

## 2020-06-28 DIAGNOSIS — R0602 Shortness of breath: Secondary | ICD-10-CM | POA: Diagnosis not present

## 2020-06-28 DIAGNOSIS — I4891 Unspecified atrial fibrillation: Principal | ICD-10-CM | POA: Diagnosis present

## 2020-06-28 DIAGNOSIS — I5033 Acute on chronic diastolic (congestive) heart failure: Secondary | ICD-10-CM | POA: Diagnosis present

## 2020-06-28 DIAGNOSIS — E876 Hypokalemia: Secondary | ICD-10-CM | POA: Diagnosis not present

## 2020-06-28 DIAGNOSIS — I10 Essential (primary) hypertension: Secondary | ICD-10-CM | POA: Diagnosis present

## 2020-06-28 DIAGNOSIS — I509 Heart failure, unspecified: Secondary | ICD-10-CM | POA: Diagnosis not present

## 2020-06-28 DIAGNOSIS — J811 Chronic pulmonary edema: Secondary | ICD-10-CM | POA: Diagnosis not present

## 2020-06-28 DIAGNOSIS — G4733 Obstructive sleep apnea (adult) (pediatric): Secondary | ICD-10-CM | POA: Diagnosis present

## 2020-06-28 DIAGNOSIS — Z20822 Contact with and (suspected) exposure to covid-19: Secondary | ICD-10-CM | POA: Diagnosis present

## 2020-06-28 LAB — CBG MONITORING, ED
Glucose-Capillary: 102 mg/dL — ABNORMAL HIGH (ref 70–99)
Glucose-Capillary: 101 mg/dL — ABNORMAL HIGH (ref 70–99)
Glucose-Capillary: 102 mg/dL — ABNORMAL HIGH (ref 70–99)

## 2020-06-28 LAB — RESP PANEL BY RT-PCR (FLU A&B, COVID) ARPGX2
Influenza A by PCR: NEGATIVE
Influenza B by PCR: NEGATIVE
SARS Coronavirus 2 by RT PCR: NEGATIVE

## 2020-06-28 LAB — COMPREHENSIVE METABOLIC PANEL
ALT: 44 U/L (ref 0–44)
AST: 35 U/L (ref 15–41)
Albumin: 3.6 g/dL (ref 3.5–5.0)
Alkaline Phosphatase: 65 U/L (ref 38–126)
Anion gap: 11 (ref 5–15)
BUN: 20 mg/dL (ref 8–23)
CO2: 25 mmol/L (ref 22–32)
Calcium: 9.2 mg/dL (ref 8.9–10.3)
Chloride: 106 mmol/L (ref 98–111)
Creatinine, Ser: 0.76 mg/dL (ref 0.44–1.00)
GFR, Estimated: >60 mL/min (ref >60)
Glucose, Bld: 132 mg/dL — ABNORMAL HIGH (ref 70–99)
Potassium: 3.8 mmol/L (ref 3.5–5.1)
Sodium: 142 mmol/L (ref 135–145)
Total Bilirubin: 1.1 mg/dL (ref 0.3–1.2)
Total Protein: 7.4 g/dL (ref 6.5–8.1)

## 2020-06-28 LAB — CBC
HCT: 43.1 % (ref 36.0–46.0)
Hemoglobin: 14.1 g/dL (ref 12.0–15.0)
MCH: 30.8 pg (ref 26.0–34.0)
MCHC: 32.7 g/dL (ref 30.0–36.0)
MCV: 94.1 fL (ref 80.0–100.0)
Platelets: 505 10*3/uL — ABNORMAL HIGH (ref 150–400)
RBC: 4.58 MIL/uL (ref 3.87–5.11)
RDW: 13.8 % (ref 11.5–15.5)
WBC: 13.6 10*3/uL — ABNORMAL HIGH (ref 4.0–10.5)
nRBC: 0 % (ref 0.0–0.2)

## 2020-06-28 LAB — PROTIME-INR
INR: 1.3 — ABNORMAL HIGH (ref 0.8–1.2)
Prothrombin Time: 15.2 seconds (ref 11.4–15.2)

## 2020-06-28 LAB — TROPONIN I (HIGH SENSITIVITY)
Troponin I (High Sensitivity): 7 ng/L (ref <18)
Troponin I (High Sensitivity): 6 ng/L (ref <18)

## 2020-06-28 LAB — BRAIN NATRIURETIC PEPTIDE: B Natriuretic Peptide: 1131.8 pg/mL — ABNORMAL HIGH (ref 0.0–100.0)

## 2020-06-28 MED ORDER — FLUOROURACIL 5 % EX CREA: 1.0000 "application " | TOPICAL_CREAM | Freq: Two times a day (BID) | CUTANEOUS | Status: DC

## 2020-06-28 MED ORDER — ONDANSETRON HCL 4 MG/2ML IJ SOLN: 4.0000 mg | Freq: Four times a day (QID) | INTRAMUSCULAR | Status: DC | PRN

## 2020-06-28 MED ORDER — INSULIN ASPART 100 UNIT/ML ~~LOC~~ SOLN
0.0000 [IU] | Freq: Three times a day (TID) | SUBCUTANEOUS | Status: DC
  Administered 2020-06-29 (×2): 3 [IU] via SUBCUTANEOUS
  Filled 2020-06-28 (×2): qty 1

## 2020-06-28 MED ORDER — FESOTERODINE FUMARATE ER 4 MG PO TB24
4.0000 mg | ORAL_TABLET | Freq: Every day | ORAL | Status: DC
  Filled 2020-06-28 (×3): qty 1

## 2020-06-28 MED ORDER — DOCUSATE SODIUM 100 MG PO CAPS
100.0000 mg | ORAL_CAPSULE | ORAL | Status: DC
  Filled 2020-06-28: qty 1

## 2020-06-28 MED ORDER — APIXABAN 5 MG PO TABS
5.0000 mg | ORAL_TABLET | Freq: Two times a day (BID) | ORAL | Status: DC
Start: 2020-06-28 — End: 2020-06-30
  Administered 2020-06-28 – 2020-06-30 (×5): 5 mg via ORAL
  Filled 2020-06-28 (×5): qty 1

## 2020-06-28 MED ORDER — FUROSEMIDE 10 MG/ML IJ SOLN: 40.0000 mg | Freq: Once | INTRAMUSCULAR | Status: AC

## 2020-06-28 MED ORDER — DILTIAZEM HCL-DEXTROSE 125-5 MG/125ML-% IV SOLN (PREMIX)
5.0000 mg/h | INTRAVENOUS | Status: DC
  Administered 2020-06-28: 5 mg/h via INTRAVENOUS
  Administered 2020-06-28 – 2020-06-29 (×2): 12.5 mg/h via INTRAVENOUS
  Administered 2020-06-30: 5 mg/h via INTRAVENOUS
  Administered 2020-06-30: 10 mg/h via INTRAVENOUS
  Filled 2020-06-28 (×5): qty 125

## 2020-06-28 MED ORDER — APIXABAN 5 MG PO TABS: 5.0000 mg | ORAL_TABLET | Freq: Two times a day (BID) | ORAL | Status: DC

## 2020-06-28 MED ORDER — DILTIAZEM HCL ER COATED BEADS 120 MG PO CP24
240.0000 mg | ORAL_CAPSULE | Freq: Every day | ORAL | Status: DC
  Administered 2020-06-28 – 2020-06-30 (×3): 240 mg via ORAL
  Filled 2020-06-28: qty 2
  Filled 2020-06-28 (×2): qty 1

## 2020-06-28 MED ORDER — FUROSEMIDE 10 MG/ML IJ SOLN
40.0000 mg | Freq: Two times a day (BID) | INTRAMUSCULAR | Status: DC
  Administered 2020-06-28 – 2020-06-30 (×4): 40 mg via INTRAVENOUS
  Filled 2020-06-28 (×4): qty 4

## 2020-06-28 MED ORDER — SODIUM CHLORIDE 0.9 % IV SOLN: INTRAVENOUS | Status: DC

## 2020-06-28 MED ORDER — OCUVITE-LUTEIN PO CAPS
1.0000 | ORAL_CAPSULE | Freq: Every day | ORAL | Status: DC
  Administered 2020-06-28 – 2020-06-30 (×3): 1 via ORAL
  Filled 2020-06-28 (×3): qty 1

## 2020-06-28 MED ORDER — PRAVASTATIN SODIUM 40 MG PO TABS
40.0000 mg | ORAL_TABLET | Freq: Every evening | ORAL | Status: DC
  Administered 2020-06-29: 40 mg via ORAL
  Filled 2020-06-28 (×4): qty 1

## 2020-06-28 MED ORDER — ACETAMINOPHEN 325 MG PO TABS
650.0000 mg | ORAL_TABLET | ORAL | Status: DC | PRN
  Administered 2020-06-28: 650 mg via ORAL
  Filled 2020-06-28: qty 2

## 2020-06-28 MED ORDER — EPINEPHRINE 0.3 MG/0.3ML IJ SOAJ
0.3000 mg | INTRAMUSCULAR | Status: DC | PRN
  Filled 2020-06-28: qty 0.3

## 2020-06-28 MED ORDER — FUROSEMIDE 10 MG/ML IJ SOLN
INTRAMUSCULAR | Status: AC
  Administered 2020-06-28: 40 mg via INTRAVENOUS
  Filled 2020-06-28: qty 4

## 2020-06-28 MED ORDER — SOTALOL HCL 80 MG PO TABS
80.0000 mg | ORAL_TABLET | Freq: Two times a day (BID) | ORAL | Status: DC
  Administered 2020-06-28 – 2020-06-30 (×5): 80 mg via ORAL
  Filled 2020-06-28 (×6): qty 1

## 2020-06-28 MED ORDER — BEMPEDOIC ACID 180 MG PO TABS: 180.0000 mg | ORAL_TABLET | Freq: Every day | ORAL | Status: DC

## 2020-06-28 NOTE — Consult Note (Signed)
Tammy [name] is a 69 y.o. female  [id number]  Primary Cardiologist: Neoma Laming Reason for Consultation: A fib with RVR and Acute HFpEF Exacerbation  HPI: Patient is a 69 year old female with past medical history of atrial fibrillation, hypertension, hyperlipidemia, sleep apnea and DMII. Patient is presenting to the hospital with palpitations and worsening dyspnea over three days. Patient recently hospitalized just over 1 week ago when she was first diagnosed with A. fib with RVR.  Patient was discharged and seen in the office and at that time remained in atrial fibrillation but was rate controlled.  Patient now back in Atrial fibrillation with RVR accompanied by HFpEF exacerbation. Patient was started on a Cardizem infusion.  Echocardiogram from last admission showed EF 50 to 55% with mild LVH and grade I diastolic dysfunction we have been consulted to help with atrial fibrillation and HFpEF management.  Review of Systems: Patient denies chest pain. States she continues to have mild dyspnea and is requiring supplemental oxygen. Denies dizziness or palpitations.     Past Medical History:  Diagnosis Date  . Agatston coronary artery calcium score greater than 400   . Diabetes mellitus ([place])    x 3 years  . DJD (degenerative joint disease)   . Encephalitis   . Fibromyalgia   . HTN (hypertension)   . Hyperlipidemia   . Sleep apnea    No CPAP    (Not in a hospital admission)    . apixaban  5 mg Oral BID  . Bempedoic Acid  180 mg Oral Daily  . diltiazem  240 mg Oral Daily  . docusate sodium  100 mg Oral Weekly  . fesoterodine  4 mg Oral Daily  . fluorouracil  1 application Topical BID  . furosemide  40 mg Intravenous BID  . insulin aspart  0-20 Units Subcutaneous TID WC  . multivitamin-lutein  1 capsule Oral Daily  . pravastatin  40 mg Oral QPM  . sotalol  80 mg Oral Q12H    Infusions: . sodium chloride Stopped (06/28/20 1113)  . diltiazem (CARDIZEM) infusion  12.5 mg/hr (06/28/20 1024)    Allergies  Allergen Reactions  . Betadine [Povidone Iodine] Anaphylaxis  . Contrast Media [Iodinated Diagnostic Agents] Anaphylaxis  . Iodine Anaphylaxis  . Metrizamide Anaphylaxis  . Povidone-Iodine Anaphylaxis  . Shellfish Allergy Anaphylaxis  . Hydrocodone-Acetaminophen Nausea Only    Social History   Socioeconomic History  . Marital status: Married    Spouse name: Not on file  . Number of children: 0  . Years of education: Not on file  . Highest education level: Not on file  Occupational History  . Not on file  Tobacco Use  . Smoking status: Former Smoker    Packs/day: 1.00    Years: 30.00    Pack years: 30.00    Types: Cigarettes    Quit date: 11/01/2008    Years since quitting: 11.6  . Smokeless tobacco: Never Used  Vaping Use  . Vaping Use: Never used  Substance and Sexual Activity  . Alcohol use: No    Alcohol/week: 0.0 standard drinks  . Drug use: No  . Sexual activity: Not on file  Other Topics Concern  . Not on file  Social History Narrative   Lives with husband   Social Determinants of Health   Financial Resource Strain: Not on file  Food Insecurity: Not on file  Transportation Needs: Not on file  Physical Activity: Not on file  Stress: Not on  file  Social Connections: Not on file  Intimate Partner Violence: Not on file    Family History  Problem Relation Age of Onset  . CAD Mother 28  . CAD Brother 50  . Breast cancer Cousin     PHYSICAL EXAM: Vitals:   06/28/20 0830 06/28/20 1100  BP: (!) 131/108 (!) 144/87  Pulse: 75 (!) 109  Resp: 16 15  Temp:    SpO2:  96%     Intake/Output Summary (Last 24 hours) at 06/28/2020 1143 Last data filed at 06/28/2020 0856 Gross per 24 hour  Intake 3.72 ml  Output 1950 ml  Net -1946.28 ml    General:  Well appearing. No respiratory difficulty HEENT: normal Neck: supple. no JVD. Carotids 2+ bilat; no bruits. No lymphadenopathy or thryomegaly appreciated. Cor:  Irregularly irregular.. Lungs: clear Abdomen: soft, nontender, nondistended. No hepatosplenomegaly. No bruits or masses. Good bowel sounds. Extremities: +1 BLE pitting edema Neuro: alert & oriented x 3, cranial nerves grossly intact. moves all 4 extremities w/o difficulty. Affect pleasant.  ECG: Atrial fibrillation with rapid ventricular response.  Results for orders placed or performed during the hospital encounter of 06/28/20 (from the past 24 hour(s))  CBC     Status: Abnormal   Collection Time: 06/28/20  5:39 AM  Result Value Ref Range   WBC 13.6 (H) 4.0 - 10.5 K/uL   RBC 4.58 3.87 - 5.11 MIL/uL   Hemoglobin 14.1 12.0 - 15.0 g/dL   HCT 43.1 36.0 - 46.0 %   MCV 94.1 80.0 - 100.0 fL   MCH 30.8 26.0 - 34.0 pg   MCHC 32.7 30.0 - 36.0 g/dL   RDW 13.8 11.5 - 15.5 %   Platelets 505 (H) 150 - 400 K/uL   nRBC 0.0 0.0 - 0.2 %  Troponin I (High Sensitivity)     Status: None   Collection Time: 06/28/20  5:39 AM  Result Value Ref Range   Troponin I (High Sensitivity) 7 <18 ng/L  Comprehensive metabolic panel     Status: Abnormal   Collection Time: 06/28/20  5:39 AM  Result Value Ref Range   Sodium 142 135 - 145 mmol/L   Potassium 3.8 3.5 - 5.1 mmol/L   Chloride 106 98 - 111 mmol/L   CO2 25 22 - 32 mmol/L   Glucose, Bld 132 (H) 70 - 99 mg/dL   BUN 20 8 - 23 mg/dL   Creatinine, Ser 0.76 0.44 - 1.00 mg/dL   Calcium 9.2 8.9 - 10.3 mg/dL   Total Protein 7.4 6.5 - 8.1 g/dL   Albumin 3.6 3.5 - 5.0 g/dL   AST 35 15 - 41 U/L   ALT 44 0 - 44 U/L   Alkaline Phosphatase 65 38 - 126 U/L   Total Bilirubin 1.1 0.3 - 1.2 mg/dL   GFR, Estimated >60 >60 mL/min   Anion gap 11 5 - 15  Brain natriuretic peptide     Status: Abnormal   Collection Time: 06/28/20  5:39 AM  Result Value Ref Range   B Natriuretic Peptide 1,131.8 (H) 0.0 - 100.0 pg/mL  Resp Panel by RT-PCR (Flu A&B, Covid) Nasopharyngeal Swab     Status: None   Collection Time: 06/28/20  5:40 AM   Specimen: Nasopharyngeal Swab;  Nasopharyngeal(NP) swabs in vial transport medium  Result Value Ref Range   SARS Coronavirus 2 by RT PCR NEGATIVE NEGATIVE   Influenza A by PCR NEGATIVE NEGATIVE   Influenza B by PCR NEGATIVE NEGATIVE  Troponin I (  High Sensitivity)     Status: None   Collection Time: 06/28/20  7:19 AM  Result Value Ref Range   Troponin I (High Sensitivity) 6 <18 ng/L  CBG monitoring, ED     Status: Abnormal   Collection Time: 06/28/20  9:38 AM  Result Value Ref Range   Glucose-Capillary 102 (H) 70 - 99 mg/dL  Protime-INR     Status: Abnormal   Collection Time: 06/28/20 11:16 AM  Result Value Ref Range   Prothrombin Time 15.2 11.4 - 15.2 seconds   INR 1.3 (H) 0.8 - 1.2   DG Chest Port 1 View  Result Date: 06/28/2020 CLINICAL DATA:  Shortness of breath. Recently diagnosed with atrial fibrillation. 10 pound weight gain in the past week. EXAM: PORTABLE CHEST 1 VIEW COMPARISON:  06/16/2020 FINDINGS: The cardiac silhouette is borderline enlarged. Aortic atherosclerosis is noted. There is new pulmonary vascular congestion with bilateral interstitial lung opacities. No sizable pleural effusion or pneumothorax is identified. No acute osseous abnormality is seen. IMPRESSION: New pulmonary vascular congestion and interstitial opacities likely reflecting edema. Electronically Signed   By: Logan Bores M.D.   On: 06/28/2020 05:52     ASSESSMENT AND PLAN: Patient presenting to the emergency department with palpitations and dyspnea.  Patient found to be back in atrial fibrillation with RVR as well as HFpEF exacerbation with elevated BNP.  As this is the patient's second hospitalization due to atrial fibrillation with RVR we will plan on TEE with DCCV scheduled for 12/14 8AM.  Please continue oral sotalol 80 mg twice daily, diltiazem XR 240 mg daily, and please titrate diltiazem infusion for heart rate less than 110.  Patient already appropriately anticoagulated with Eliquis 5 mg twice daily.  In the setting of a HFpEF  exacerbation, continued dyspnea, and pulmonary edema on chest x-ray please continue to diurese with furosemide 40 mg IV twice daily. We will continue to follow.   Adaline Sill NP-C

## 2020-06-28 NOTE — Consult Note (Signed)
   Heart Failure Nurse Navigator Note  HFpEF 50-55%. LVH. Diastolic dysfunction.  She presented with progressive shortness of breath, weight gain of 10 pounds over several days and mild lower extremity edema.   Comorbidities:  Atrial fibrillation Hypertension Hyperlipidemia Sleep apnea Diabetes type 2   Medications:  Cardizem infusion Furosemide 40 mg IV twice daily Eliquis 5 mg twice daily Pravachol 40 mg every p.m. Sotalol 80 mg every 12 hours   Labs: Sodium 142, potassium 2.8, chloride 106, CO2 25, BUN 20, creatinine 0.76, albumin 3.6, AST 35, ALT 44, total bilirubin 1.1, BNP 1131, troponin 7,  Weight 104.3 kg BMI 44.93 Blood pressure 131/108 pulse 100-110   Assessment:   General-she is awake and alert in no acute distress.  Husband at bedside.  HEENT-pupils are equal, normocephalic.  No JVD  Cardiac-heart tones are irregular no murmurs or rubs appreciated.   Chest-breath sounds are clear to posterior auscultation.  Abdomen soft nontender  Musculoskeletal there is no edema noted.  Psych she is pleasant and appropriate, makes good eye contact.  Neurologic-speech is clear, moves all extremities without difficulty.     Patient was seen in the emergency room with her husband at the bedside.  She states that she still gets somewhat short of breath with conversation.  She is almost lying supine on the gurney.   She is scheduled for TEE/DCCV tomorrow.  He and her husband were asking questions about the procedure.  Discussed the relationship between fast heart rate and her normal ejection fraction.  States that she is unable to tell when she is in this fast atrial fib rhythm.   Discussed low-sodium diet and removing salt from the table.  Discussed the importance of monitoring her weight and reporting a 2 to 3 pound weight gain in a day or 5 pounds within a week.  She was given the heart failure teaching booklet along with his own magnet.  Instructed  would follow-up with her tomorrow in case she had more questions.   Pricilla Riffle RN, CHFN

## 2020-06-28 NOTE — ED Triage Notes (Signed)
Patient states that she was recently diagnosed with afib. Patient states that she has been short of breath times three days but became worse this morning. Patient states that she has gained over 10 lbs in the past week.

## 2020-06-28 NOTE — ED Notes (Signed)
In pt's room to check on her, states that the purwick isn't working. Noted to be full. Emptied and bed linens changed. Saturated with urine. Pt cleansed and new linens applied. States having leg cramps. Will review MAR.

## 2020-06-28 NOTE — ED Notes (Signed)
Pt placed on 4L oxygen by nasal cannula due to oxygen level in 80s and c/o SOB.

## 2020-06-28 NOTE — ED Notes (Signed)
Pt reports improved work of breathing, approx 366ml in urine canister; EDP notified

## 2020-06-28 NOTE — H&P (Signed)
History and Physical    Tammy [name] IRW:[id number] DOB: 09-24-1950 DOA: 06/28/2020  PCP: Tammy Redwood, MD   Patient coming from: Home  I have personally briefly reviewed patient's old medical records in [place]  Chief Complaint: Shortness of breath  HPI: Tammy [name] is a 69 y.o. female with medical history significant for diabetes mellitus, hypertension and A. fib who presents to the ER for evaluation of a 3-day history of progressively worsening shortness of breath associated with palpitations.  Patient states that her shortness of breath started about 3 days ago and was initially with exertion but now short of breath.  She admits to having a 10 pound weight gain over the last couple of days.  Shortness of breath is associated with diaphoresis, mild lower extremity swelling as well as palpitations. She denies having any chest pain, no abdominal pain, no orthopnea, no nausea, no vomiting, no changes in her bowel habits, no fever, no chills, no cough no urinary symptoms. Labs show sodium 142, potassium 3.8, chloride 106, bicarb 25, glucose 132, BUN 20, creatinine 0.76, calcium 9.2, alkaline phosphatase 65, albumin 3.6, AST 35, ALT 44, total protein 7.4, BNP 1131, white count 13.6, hemoglobin 14.1, hematocrit 43.1, MCV 94.1, RDW 13.8, platelet count 505 Respiratory viral panel is negative Chest x-ray reviewed by me shows new pulmonary vascular congestion and interstitial opacities likely reflecting pulmonary edema. Twelve-lead EKG reviewed by me shows atrial fibrillation with a response.   ED Course: Patient is a 69 year old Caucasian female recently diagnosed with new onset A. fib who presents to the emergency room for evaluation of a 3-day history of worsening shortness of breath associated with leg swelling, diaphoresis and palpitations.  She admits to 10 pound weight gain in the last couple of days.  Patient was noted to be in rapid atrial fibrillation when she  arrived the ER and was started on a Cardizem drip for rate control.  Chest x-ray showed pulmonary edema and BNP is elevated consistent with acute CHF.  Room air pulse oximetry was in the low 80s at rest and patient is currently on 4 L of oxygen to maintain pulse oximetry greater than 92%.  She received a dose of Lasix and will be admitted to the hospital for further evaluation.  Review of Systems: As per HPI otherwise 10 point review of systems negative.    Past Medical History:  Diagnosis Date  . Agatston coronary artery calcium score greater than 400   . Diabetes mellitus ([place])    x 3 years  . DJD (degenerative joint disease)   . Encephalitis   . Fibromyalgia   . HTN (hypertension)   . Hyperlipidemia   . Sleep apnea    No CPAP    Past Surgical History:  Procedure Laterality Date  . APPENDECTOMY    . BREAST CYST EXCISION    . BREAST EXCISIONAL BIOPSY Left 2004  . KNEE ARTHROSCOPY       reports that she quit smoking about 11 years ago. Her smoking use included cigarettes. She has a 30.00 pack-year smoking history. She has never used smokeless tobacco. She reports that she does not drink alcohol and does not use drugs.  Allergies  Allergen Reactions  . Betadine [Povidone Iodine] Anaphylaxis  . Contrast Media [Iodinated Diagnostic Agents] Anaphylaxis  . Iodine Anaphylaxis  . Metrizamide Anaphylaxis  . Povidone-Iodine Anaphylaxis  . Shellfish Allergy Anaphylaxis  . Hydrocodone-Acetaminophen Nausea Only    Family History  Problem Relation Age of Onset  .  CAD Mother 54  . CAD Brother 97  . Breast cancer Cousin      Prior to Admission medications   Medication Sig Start Date End Date Taking? Authorizing Provider  amLODipine (NORVASC) 10 MG tablet Take 10 mg by mouth daily.   Yes [provider]  apixaban (ELIQUIS) 5 MG TABS tablet Take 1 tablet (5 mg total) by mouth 2 (two) times daily. 06/19/20  Yes Wieting, Richard, MD  Bempedoic Acid 180 MG TABS Take 180 mg by  mouth daily.   Yes [provider]  beta carotene w/minerals (OCUVITE) tablet Take 1 tablet by mouth daily.   Yes [provider]  Docusate Sodium (DSS) 100 MG CAPS Take 100 mg by mouth once a week.   Yes [provider]  DULoxetine (CYMBALTA) 60 MG capsule Take 60 mg by mouth daily.   Yes [provider]  EPINEPHrine 0.3 mg/0.3 mL IJ SOAJ injection Inject 0.3 mg into the muscle as needed for anaphylaxis.   Yes [provider]  Evolocumab (REPATHA SURECLICK) 277 MG/ML SOAJ Inject 1 Dose into the skin every 14 (fourteen) days.   Yes [provider]  Exenatide (BYDUREON [place]) Inject 1 Dose into the skin once a week.   Yes [provider]  fesoterodine (TOVIAZ) 4 MG TB24 tablet Take 4 mg by mouth daily.   Yes [provider]  metFORMIN (GLUCOPHAGE) 1000 MG tablet Take 1,000 mg by mouth daily with breakfast.    Yes [provider]  naproxen sodium (ALEVE) 220 MG tablet Take 440 mg by mouth daily as needed (pain).   Yes [provider]  pravastatin (PRAVACHOL) 40 MG tablet Take 1 tablet (40 mg total) by mouth every evening. 01/29/20 06/17/20 Yes Hilty, Nadean Corwin, MD  sotalol (BETAPACE) 80 MG tablet Take 1 tablet (80 mg total) by mouth every 12 (twelve) hours. 06/19/20  Yes Wieting, Richard, MD  valACYclovir (VALTREX) 500 MG tablet Take 500 mg by mouth daily.    Yes [provider]  diltiazem (CARDIZEM CD) 240 MG 24 hr capsule Take 1 capsule (240 mg total) by mouth daily. 06/20/20   Loletha Grayer, MD  fluorouracil (EFUDEX) 5 % cream Apply 1 application topically. 05/27/20   [provider]    Physical Exam: Vitals:   06/28/20 0715 06/28/20 0730 06/28/20 0800 06/28/20 0817  BP:  (!) 160/100 139/86   Pulse: (!) 144 (!) 132 (!) 131   Resp: (!) 22 20 17    Temp:    97.8 F (36.6 C)  TempSrc:    Oral  SpO2: 96% 96% 95%   Weight:      Height:         Vitals:   06/28/20 0715 06/28/20 0730  06/28/20 0800 06/28/20 0817  BP:  (!) 160/100 139/86   Pulse: (!) 144 (!) 132 (!) 131   Resp: (!) 22 20 17    Temp:    97.8 F (36.6 C)  TempSrc:    Oral  SpO2: 96% 96% 95%   Weight:      Height:        Constitutional: NAD, alert and oriented x 3 Eyes: PERRL, lids and conjunctivae normal ENMT: Mucous membranes are moist.  Neck: normal, supple, no masses, no thyromegaly Respiratory: Rales at the bases bilaterally, no wheezing,  Normal respiratory effort. No accessory muscle use.  Cardiovascular: Irregularly irregular, tachycardic no murmurs / rubs / gallops. 1+ extremity edema. 2+ pedal pulses. No carotid bruits.  Abdomen: no tenderness, no  masses palpated. No hepatosplenomegaly. Bowel sounds positive.  Central adiposity Musculoskeletal: no clubbing / cyanosis. No joint deformity upper and lower extremities.  Skin: no rashes, lesions, ulcers.  Varicose veins in both lower extremities Neurologic: No gross focal neurologic deficit. Psychiatric: Normal mood and affect.   Labs on Admission: I have personally reviewed following labs and imaging studies  CBC: Recent Labs  Lab 06/28/20 0539  WBC 13.6*  HGB 14.1  HCT 43.1  MCV 94.1  PLT 469*   Basic Metabolic Panel: Recent Labs  Lab 06/28/20 0539  NA 142  K 3.8  CL 106  CO2 25  GLUCOSE 132*  BUN 20  CREATININE 0.76  CALCIUM 9.2   GFR: Estimated Creatinine Clearance: 72.3 mL/min (by C-G formula based on SCr of 0.76 mg/dL). Liver Function Tests: Recent Labs  Lab 06/28/20 0539  AST 35  ALT 44  ALKPHOS 65  BILITOT 1.1  PROT 7.4  ALBUMIN 3.6   No results for input(s): LIPASE, AMYLASE in the last 168 hours. No results for input(s): AMMONIA in the last 168 hours. Coagulation Profile: No results for input(s): INR, PROTIME in the last 168 hours. Cardiac Enzymes: No results for input(s): CKTOTAL, CKMB, CKMBINDEX, TROPONINI in the last 168 hours. BNP (last 3 results) No results for input(s): PROBNP in the last 8760  hours. HbA1C: No results for input(s): HGBA1C in the last 72 hours. CBG: No results for input(s): GLUCAP in the last 168 hours. Lipid Profile: No results for input(s): CHOL, HDL, LDLCALC, TRIG, CHOLHDL, LDLDIRECT in the last 72 hours. Thyroid Function Tests: No results for input(s): TSH, T4TOTAL, FREET4, T3FREE, THYROIDAB in the last 72 hours. Anemia Panel: No results for input(s): VITAMINB12, FOLATE, FERRITIN, TIBC, IRON, RETICCTPCT in the last 72 hours. Urine analysis:    Component Value Date/Time   COLORURINE YELLOW 03/30/2016 0424   APPEARANCEUR CLEAR 03/30/2016 0424   LABSPEC 1.020 03/30/2016 0424   PHURINE 7.5 03/30/2016 0424   GLUCOSEU NEGATIVE 03/30/2016 0424   HGBUR NEGATIVE 03/30/2016 0424   BILIRUBINUR NEGATIVE 03/30/2016 0424   KETONESUR NEGATIVE 03/30/2016 0424   PROTEINUR >300 (A) 03/30/2016 0424   UROBILINOGEN 0.2 04/26/2008 1526   NITRITE NEGATIVE 03/30/2016 0424   LEUKOCYTESUR NEGATIVE 03/30/2016 0424    Radiological Exams on Admission: DG Chest Port 1 View  Result Date: 06/28/2020 CLINICAL DATA:  Shortness of breath. Recently diagnosed with atrial fibrillation. 10 pound weight gain in the past week. EXAM: PORTABLE CHEST 1 VIEW COMPARISON:  06/16/2020 FINDINGS: The cardiac silhouette is borderline enlarged. Aortic atherosclerosis is noted. There is new pulmonary vascular congestion with bilateral interstitial lung opacities. No sizable pleural effusion or pneumothorax is identified. No acute osseous abnormality is seen. IMPRESSION: New pulmonary vascular congestion and interstitial opacities likely reflecting edema. Electronically Signed   By: Logan Bores M.D.   On: 06/28/2020 05:52    EKG: Independently reviewed.  Atrial fibrillation with rapid ventricular rate  Assessment/Plan Principal Problem:   Atrial fibrillation with RVR (HCC) Active Problems:   Diabetes mellitus (HCC)   HTN (hypertension)   Sleep apnea   Obesity, Class III, BMI 40-49.9 (morbid  obesity) (HCC)   Depression   Respiratory failure, acute (HCC)   Acute diastolic CHF (congestive heart failure) (HCC)     Atrial fibrillation with RVR Patient with a history of recently diagnosed A. fib discharged home on sotalol who presents to the ER for evaluation of shortness of breath and palpitations and noted to be in atrial fibrillation with rapid ventricular rate Continue  Cardizem drip Resume sotalol and oral Cardizem to optimize rate control Continue apixaban as primary prophylaxis for an acute stroke     Acute diastolic dysfunction CHF Most likely rate related Patient had a recent 2D echocardiogram which showed an LVEF of 50 to 55% with LVH and LV diastolic dysfunction Place patient on Lasix 40 mg IV daily Optimize blood pressure control Request cardiology consult Maintain low-sodium diet    Acute respiratory failure Secondary to acute diastolic dysfunction CHF Patient was tachypneic and noted to have room air pulse oximetry in the low 80s She is currently on 4 L of oxygen with improvement in her pulse oximetry to greater than 92% Patient will need to be assessed for home oxygen need prior to discharge Wean off oxygen as tolerated     Diabetes mellitus Maintain consistent carbohydrate diet Hold Metformin Glycemic control with sliding scale insulin     Morbid obesity (BMI 42) Complicates overall prognosis and care     Hypertension Continue diltiazem and sotalol    Dyslipidemia Continue statins       DVT prophylaxis: Apixaban Code Status: Full code Family Communication: Greater than 50% of time was spent discussing patient's condition and plan of care with her and her husband at the bedside.  All questions and concerns have been addressed.  She verbalizes understanding and agrees with the plan.  CODE STATUS was discussed and she is a full code Disposition Plan: Back to previous home environment Consults called:  Cardiology    Seara Hinesley MD [facility]     06/28/2020, 8:41 AM

## 2020-06-28 NOTE — ED Provider Notes (Signed)
[facility] Emergency Department Provider Note   ____________________________________________   Event Date/Time   First MD Initiated Contact with Patient 06/28/20 [phone]     (approximate)  I have reviewed the triage vital signs and the nursing notes.   HISTORY  Chief Complaint Shortness of Breath    HPI Tammy [name] is a 69 y.o. female with a stated past medical history of diabetes, hypertension, and atrial fibrillation who presents for shortness of breath and palpitations that been worsening over the last 3 days and became much worse this morning prompting her visit to the emergency department.  Patient states dyspnea on exertion is partially relieved at rest.  Patient denies any medication nonadherence.  Patient denies any other exacerbating or relieving factors.  Patient currently denies any vision changes, tinnitus, difficulty speaking, facial droop, sore throat, chest pain, abdominal pain, nausea/vomiting/diarrhea, dysuria, or weakness/numbness/paresthesias in any extremity         Past Medical History:  Diagnosis Date  . Agatston coronary artery calcium score greater than 400   . Diabetes mellitus ([place])    x 3 years  . DJD (degenerative joint disease)   . Encephalitis   . Fibromyalgia   . HTN (hypertension)   . Hyperlipidemia   . Sleep apnea    No CPAP    Patient Active Problem List   Diagnosis Date Noted  . Respiratory failure, acute ([place]) 06/28/2020  . Acute diastolic CHF (congestive heart failure) ([place]) 06/28/2020  . Atrial fibrillation with rapid ventricular response ([place]) 06/28/2020  . Acquired thrombophilia ([place])   . Depression   . Atrial fibrillation with RVR ([place]) 06/16/2020  . Diabetes mellitus ([place])   . HTN (hypertension)   . Sleep apnea   . Obesity, Class III, BMI 40-49.9 (morbid obesity) ([place])   . Chronic venous insufficiency 12/30/2018  . Varicose veins of both lower extremities with inflammation 12/30/2018  .  DJD (degenerative joint disease) 12/30/2018  . Snoring 11/27/2018  . Daytime sleepiness 11/27/2018  . Excessive sleepiness 11/27/2018  . Educated about COVID-19 virus infection 11/27/2018  . SOB (shortness of breath) 11/27/2018  . Hyperlipidemia 05/20/2015  . Knee pain 06/06/2012    Past Surgical History:  Procedure Laterality Date  . APPENDECTOMY    . BREAST CYST EXCISION    . BREAST EXCISIONAL BIOPSY Left 2004  . KNEE ARTHROSCOPY    . TEE WITHOUT CARDIOVERSION N/A 06/29/2020   Procedure: TRANSESOPHAGEAL ECHOCARDIOGRAM (TEE) with DCCV;  Surgeon: Dionisio David, MD;  Location: [facility] ORS;  Service: Cardiovascular;  Laterality: N/A;    Prior to Admission medications   Medication Sig Start Date End Date Taking? Authorizing Provider  apixaban (ELIQUIS) 5 MG TABS tablet Take 1 tablet (5 mg total) by mouth 2 (two) times daily. 06/19/20  Yes Wieting, Richard, MD  Bempedoic Acid 180 MG TABS Take 180 mg by mouth daily.   Yes [provider]  beta carotene w/minerals (OCUVITE) tablet Take 1 tablet by mouth daily.   Yes [provider]  Docusate Sodium (DSS) 100 MG CAPS Take 100 mg by mouth once a week.   Yes [provider]  DULoxetine (CYMBALTA) 60 MG capsule Take 60 mg by mouth daily.   Yes [provider]  EPINEPHrine 0.3 mg/0.3 mL IJ SOAJ injection Inject 0.3 mg into the muscle as needed for anaphylaxis.   Yes [provider]  Evolocumab (REPATHA SURECLICK) 947 MG/ML SOAJ Inject 1 Dose into the skin every 14 (fourteen) days.   Yes  [provider]  Exenatide (BYDUREON [place]) Inject 1 Dose into the skin once a week.   Yes [provider]  fesoterodine (TOVIAZ) 4 MG TB24 tablet Take 4 mg by mouth daily.   Yes [provider]  metFORMIN (GLUCOPHAGE) 1000 MG tablet Take 1,000 mg by mouth daily with breakfast.    Yes [provider]  pravastatin (PRAVACHOL) 40 MG tablet Take 1 tablet (40 mg total) by mouth every evening.  01/29/20 06/17/20 Yes Hilty, Nadean Corwin, MD  sotalol (BETAPACE) 80 MG tablet Take 1 tablet (80 mg total) by mouth every 12 (twelve) hours. 06/19/20  Yes Wieting, Richard, MD  valACYclovir (VALTREX) 500 MG tablet Take 500 mg by mouth daily.    Yes [provider]  digoxin (LANOXIN) 0.25 MG tablet Take 1 tablet (0.25 mg total) by mouth daily. 07/01/20   Ezekiel Slocumb, DO  diltiazem (CARDIZEM CD) 240 MG 24 hr capsule Take 1 capsule (240 mg total) by mouth daily. 06/20/20   Loletha Grayer, MD  fluorouracil (EFUDEX) 5 % cream Apply 1 application topically. 05/27/20   [provider]  furosemide (LASIX) 20 MG tablet Take 1 tablet (20 mg total) by mouth 2 (two) times daily. 06/30/20   Ezekiel Slocumb, DO  potassium chloride SA (KLOR-CON) 20 MEQ tablet Take 2 tablets (40 mEq total) by mouth daily. 06/30/20   Ezekiel Slocumb, DO    Allergies Betadine [povidone iodine], Contrast media [iodinated diagnostic agents], Iodine, Metrizamide, Povidone-iodine, Shellfish allergy, and Hydrocodone-acetaminophen  Family History  Problem Relation Age of Onset  . CAD Mother 7  . CAD Brother 22  . Breast cancer Cousin     Social History Social History   Tobacco Use  . Smoking status: Former Smoker    Packs/day: 1.00    Years: 30.00    Pack years: 30.00    Types: Cigarettes    Quit date: 11/01/2008    Years since quitting: 11.6  . Smokeless tobacco: Never Used  Vaping Use  . Vaping Use: Never used  Substance Use Topics  . Alcohol use: No    Alcohol/week: 0.0 standard drinks  . Drug use: No    Review of Systems Constitutional: No fever/chills Eyes: No visual changes. ENT: No sore throat. Cardiovascular: Denies chest pain. Respiratory: Endorses shortness of breath. Gastrointestinal: No abdominal pain.  No nausea, no vomiting.  No diarrhea. Genitourinary: Negative for dysuria. Musculoskeletal: Negative for acute arthralgias Skin: Negative for rash. Neurological: Negative  for headaches, weakness/numbness/paresthesias in any extremity Psychiatric: Negative for suicidal ideation/homicidal ideation   ____________________________________________   PHYSICAL EXAM:  VITAL SIGNS: ED Triage Vitals  Enc Vitals Group     BP 06/28/20 0525 (!) 152/104     Pulse Rate 06/28/20 0521 (!) 137     Resp 06/28/20 0521 (!) 24     Temp --      Temp src --      SpO2 06/28/20 0521 90 %     Weight 06/28/20 0522 230 lb (104.3 kg)     Height 06/28/20 0522 5' (1.524 m)     Head Circumference --      Peak Flow --      Pain Score 06/28/20 0521 0     Pain Loc --      Pain Edu? --      Excl. in [place]? --    Constitutional: Alert and oriented. Well appearing and in no acute distress. Eyes: Conjunctivae are normal. PERRL. Head: Atraumatic. Nose: No congestion/rhinnorhea.  Mouth/Throat: Mucous membranes are moist. Neck: No stridor Cardiovascular: Tachycardic irregularly irregular rhythm.  Grossly normal heart sounds.  Good peripheral circulation. Respiratory: Normal respiratory effort.  No retractions. Gastrointestinal: Soft and nontender. No distention. Musculoskeletal: No obvious deformities Neurologic:  Normal speech and language. No gross focal neurologic deficits are appreciated. Skin:  Skin is warm and dry. No rash noted. Psychiatric: Mood and affect are normal. Speech and behavior are normal.  ____________________________________________   LABS (all labs ordered are listed, but only abnormal results are displayed)  Labs Reviewed  CBC - Abnormal; Notable for the following components:      Result Value   WBC 13.6 (*)    Platelets 505 (*)    All other components within normal limits  COMPREHENSIVE METABOLIC PANEL - Abnormal; Notable for the following components:   Glucose, Bld 132 (*)    All other components within normal limits  BRAIN NATRIURETIC PEPTIDE - Abnormal; Notable for the following components:   B Natriuretic Peptide 1,131.8 (*)    All other components  within normal limits  PROTIME-INR - Abnormal; Notable for the following components:   INR 1.3 (*)    All other components within normal limits  BASIC METABOLIC PANEL - Abnormal; Notable for the following components:   Potassium 3.2 (*)    Glucose, Bld 122 (*)    All other components within normal limits  CBC - Abnormal; Notable for the following components:   WBC 14.1 (*)    Platelets 451 (*)    All other components within normal limits  GLUCOSE, CAPILLARY - Abnormal; Notable for the following components:   Glucose-Capillary 135 (*)    All other components within normal limits  BASIC METABOLIC PANEL - Abnormal; Notable for the following components:   Potassium 3.0 (*)    Glucose, Bld 120 (*)    BUN 25 (*)    All other components within normal limits  GLUCOSE, CAPILLARY - Abnormal; Notable for the following components:   Glucose-Capillary 131 (*)    All other components within normal limits  GLUCOSE, CAPILLARY - Abnormal; Notable for the following components:   Glucose-Capillary 129 (*)    All other components within normal limits  GLUCOSE, CAPILLARY - Abnormal; Notable for the following components:   Glucose-Capillary 116 (*)    All other components within normal limits  GLUCOSE, CAPILLARY - Abnormal; Notable for the following components:   Glucose-Capillary 108 (*)    All other components within normal limits  CBG MONITORING, ED - Abnormal; Notable for the following components:   Glucose-Capillary 102 (*)    All other components within normal limits  CBG MONITORING, ED - Abnormal; Notable for the following components:   Glucose-Capillary 101 (*)    All other components within normal limits  CBG MONITORING, ED - Abnormal; Notable for the following components:   Glucose-Capillary 102 (*)    All other components within normal limits  RESP PANEL BY RT-PCR (FLU A&B, COVID) ARPGX2  MAGNESIUM  MAGNESIUM  GLUCOSE, CAPILLARY  CBG MONITORING, ED  TROPONIN I (HIGH SENSITIVITY)   TROPONIN I (HIGH SENSITIVITY)   ____________________________________________  EKG  ED ECG REPORT I, Naaman Plummer, the attending physician, personally viewed and interpreted this ECG.  Date: 06/28/2020 EKG Time: 0513 Rate: 137 Rhythm: Atrial fibrillation with rapid ventricular response QRS Axis: normal Intervals: normal ST/T Wave abnormalities: normal Narrative Interpretation: no evidence of acute ischemia  ____________________________________________  RADIOLOGY  ED MD interpretation: Portable 1 view x-ray of the chest shows pulmonary  vascular congestion and interstitial opacities reflecting edema  Official radiology report(s): No results found.  ____________________________________________   PROCEDURES  Procedure(s) performed (including Critical Care):  .1-3 Lead EKG Interpretation Performed by: Naaman Plummer, MD Authorized by: Naaman Plummer, MD     Interpretation: abnormal     ECG rate:  125   ECG rate assessment: tachycardic     Rhythm: atrial fibrillation     Ectopy: none     Conduction: normal   .Critical Care Performed by: Naaman Plummer, MD Authorized by: Naaman Plummer, MD   Critical care provider statement:    Critical care time (minutes):  39   Critical care time was exclusive of:  Separately billable procedures and treating other patients   Critical care was necessary to treat or prevent imminent or life-threatening deterioration of the following conditions:  Cardiac failure   Critical care was time spent personally by me on the following activities:  Discussions with consultants, evaluation of patient's response to treatment, examination of patient, ordering and performing treatments and interventions, ordering and review of laboratory studies, ordering and review of radiographic studies, pulse oximetry, re-evaluation of patient's condition, obtaining history from patient or surrogate and review of old charts   I assumed direction of critical  care for this patient from another provider in my specialty: no       ____________________________________________   INITIAL IMPRESSION / ASSESSMENT AND PLAN / ED COURSE  As part of my medical decision making, I reviewed the following data within the [place] notes reviewed and incorporated, Labs reviewed, EKG interpreted, Old chart reviewed, Radiograph reviewed and Notes from prior ED visits reviewed and incorporated        + atrial fibrillation w/ RVR DDx: Pneumothorax, Pneumonia, Pulmonary Embolus, Tamponade, ACS, Thyrotoxicosis.  No history or evidence decompensated heart failure. Given their history and exam it is likely this patient is unlikely to spontaneously revert to a rate controlled rhythm and necessitates a thorough workup for their arrhythmia. Workup: ECG, CXR, CBC, BMP, UA, Troponin, BNP, TSH, Ca-Mag-Phos Interventions: Defer Cardioversion (uncertain historical reliability with time of onset, increased risk of thromboembolic stroke).  Start diltiazem bolus and drip  Patients presentation not consistent with  Reassessment: Patient maintained NSR during multi-hour observation in ED. Disposition: Discharge home with prompt PCP follow up and cardiology referral.   Disposition: Admit  Clinical Course as of 07/01/20 0709  Mon Jun 28, 2020  0644 Pulse Rate(!): 123 [EB]    Clinical Course User Index [EB] Naaman Plummer, MD     ____________________________________________   FINAL CLINICAL IMPRESSION(S) / ED DIAGNOSES  Final diagnoses:  Shortness of breath  Congestive heart failure, unspecified HF chronicity, unspecified heart failure type [facility])  Atrial fibrillation with rapid ventricular response [facility])     ED Discharge Orders         Ordered    digoxin (LANOXIN) 0.25 MG tablet  Daily        06/30/20 1555    furosemide (LASIX) 20 MG tablet  2 times daily        06/30/20 1555    potassium chloride SA (KLOR-CON) 20 MEQ tablet   Daily        06/30/20 1555    Increase activity slowly        06/30/20 1555    Diet - low sodium heart healthy        06/30/20 1555    Discharge instructions  Comments: Dr. Humphrey Rolls said he will see you in his clinic on Monday to follow up.   Please take all medications as prescribed.    We stopped your amlodipine for now, because your BP is normal with it being held.  If you have a BP monitor at home, please check once a day, write them down and bring to follow up doctor appointments.  They may need to restart amlodipine at some point.   06/30/20 1555    Call MD for:  temperature >100.4        06/30/20 1555    Call MD for:  persistant nausea and vomiting        06/30/20 1555    Call MD for:  extreme fatigue        06/30/20 1555    Call MD for:  persistant dizziness or light-headedness        06/30/20 1555    (HEART FAILURE PATIENTS) Call MD:  Anytime you have any of the following symptoms: 1) 3 pound weight gain in 24 hours or 5 pounds in 1 week 2) shortness of breath, with or without a dry hacking cough 3) swelling in the hands, feet or stomach 4) if you have to sleep on extra pillows at night in order to breathe.        06/30/20 1555    Amb referral to [facility]        06/28/20 0731           Note:  This document was prepared using Dragon voice recognition software and may include unintentional dictation errors.   Naaman Plummer, MD 07/01/20 [phone]

## 2020-06-29 ENCOUNTER — Encounter
Admission: EM | Disposition: A | Discharge status: Home / Self Care | Payer: Self-pay | Source: Home / Self Care | Attending: Obstetrics and Gynecology

## 2020-06-29 ENCOUNTER — Inpatient Hospital Stay: Payer: Medicare Other | Admitting: Anesthesiology

## 2020-06-29 ENCOUNTER — Encounter: Payer: Self-pay | Admitting: Cardiovascular Disease

## 2020-06-29 ENCOUNTER — Inpatient Hospital Stay
Admit: 2020-06-29 | Discharge: 2020-06-29 | Disposition: A | Discharge status: Home / Self Care | Payer: Medicare Other | Attending: Cardiovascular Disease | Admitting: Cardiovascular Disease

## 2020-06-29 DIAGNOSIS — I4891 Unspecified atrial fibrillation: Principal | ICD-10-CM

## 2020-06-29 HISTORY — PX: TEE WITHOUT CARDIOVERSION: SHX5443

## 2020-06-29 LAB — CBC
HCT: 43.2 % (ref 36.0–46.0)
Hemoglobin: 14.1 g/dL (ref 12.0–15.0)
MCH: 30.3 pg (ref 26.0–34.0)
MCHC: 32.6 g/dL (ref 30.0–36.0)
MCV: 92.7 fL (ref 80.0–100.0)
Platelets: 451 10*3/uL — ABNORMAL HIGH (ref 150–400)
RBC: 4.66 MIL/uL (ref 3.87–5.11)
RDW: 13.5 % (ref 11.5–15.5)
WBC: 14.1 10*3/uL — ABNORMAL HIGH (ref 4.0–10.5)
nRBC: 0 % (ref 0.0–0.2)

## 2020-06-29 LAB — BASIC METABOLIC PANEL
Anion gap: 11 (ref 5–15)
BUN: 20 mg/dL (ref 8–23)
CO2: 31 mmol/L (ref 22–32)
Calcium: 9.1 mg/dL (ref 8.9–10.3)
Chloride: 100 mmol/L (ref 98–111)
Creatinine, Ser: 0.81 mg/dL (ref 0.44–1.00)
GFR, Estimated: >60 mL/min (ref >60)
Glucose, Bld: 122 mg/dL — ABNORMAL HIGH (ref 70–99)
Potassium: 3.2 mmol/L — ABNORMAL LOW (ref 3.5–5.1)
Sodium: 142 mmol/L (ref 135–145)

## 2020-06-29 LAB — GLUCOSE, CAPILLARY
Glucose-Capillary: 135 mg/dL — ABNORMAL HIGH (ref 70–99)
Glucose-Capillary: 131 mg/dL — ABNORMAL HIGH (ref 70–99)
Glucose-Capillary: 129 mg/dL — ABNORMAL HIGH (ref 70–99)

## 2020-06-29 LAB — CBG MONITORING, ED: Glucose-Capillary: 98 mg/dL (ref 70–99)

## 2020-06-29 LAB — MAGNESIUM: Magnesium: 1.9 mg/dL (ref 1.7–2.4)

## 2020-06-29 SURGERY — ECHOCARDIOGRAM, TRANSESOPHAGEAL
Anesthesia: General

## 2020-06-29 MED ORDER — BUTAMBEN-TETRACAINE-BENZOCAINE 2-2-14 % EX AERO
INHALATION_SPRAY | CUTANEOUS | Status: AC
  Filled 2020-06-29: qty 5

## 2020-06-29 MED ORDER — POTASSIUM CHLORIDE CRYS ER 20 MEQ PO TBCR
20.0000 meq | EXTENDED_RELEASE_TABLET | Freq: Two times a day (BID) | ORAL | Status: DC
  Administered 2020-06-29 (×2): 20 meq via ORAL
  Filled 2020-06-29: qty 1

## 2020-06-29 MED ORDER — LIDOCAINE VISCOUS HCL 2 % MT SOLN
OROMUCOSAL | Status: AC
  Filled 2020-06-29: qty 15

## 2020-06-29 MED ORDER — SODIUM CHLORIDE FLUSH 0.9 % IV SOLN
INTRAVENOUS | Status: AC
  Filled 2020-06-29: qty 10

## 2020-06-29 MED ORDER — PROPOFOL 10 MG/ML IV BOLUS
INTRAVENOUS | Status: DC | PRN
  Administered 2020-06-29: 20 mg via INTRAVENOUS
  Administered 2020-06-29: 10 mg via INTRAVENOUS
  Administered 2020-06-29: 15 mg via INTRAVENOUS
  Administered 2020-06-29: 40 mg via INTRAVENOUS
  Administered 2020-06-29 (×2): 30 mg via INTRAVENOUS
  Administered 2020-06-29: 15 mg via INTRAVENOUS

## 2020-06-29 MED ORDER — PROPOFOL 500 MG/50ML IV EMUL
INTRAVENOUS | Status: AC
  Filled 2020-06-29: qty 50

## 2020-06-29 MED ORDER — SODIUM CHLORIDE 0.9% FLUSH
10.0000 mL | Freq: Two times a day (BID) | INTRAVENOUS | Status: DC
  Administered 2020-06-29 – 2020-06-30 (×2): 10 mL via INTRAVENOUS

## 2020-06-29 MED ORDER — PROPOFOL 10 MG/ML IV BOLUS
INTRAVENOUS | Status: AC
  Filled 2020-06-29: qty 20

## 2020-06-29 MED ORDER — EPHEDRINE SULFATE 50 MG/ML IJ SOLN
INTRAMUSCULAR | Status: DC | PRN
  Administered 2020-06-29: 10 mg via INTRAVENOUS

## 2020-06-29 MED ORDER — LIDOCAINE HCL (CARDIAC) PF 100 MG/5ML IV SOSY
PREFILLED_SYRINGE | INTRAVENOUS | Status: DC | PRN
  Administered 2020-06-29: 40 mg via INTRAVENOUS

## 2020-06-29 NOTE — Progress Notes (Signed)
PROGRESS NOTE    Tammy [name]  GQB:[id number] DOB: [birth year] DOA: 06/28/2020 PCP: [name], MD  Outpatient Specialists: [facility]    Brief Narrative:  Patient is a 69 year old Caucasian female recently diagnosed with new onset A. fib who presents to the emergency room for evaluation of a 3-day history of worsening shortness of breath associated with leg swelling, diaphoresis and palpitations.  She admits to 10 pound weight gain in the last couple of days.  Patient was noted to be in rapid atrial fibrillation when she arrived the ER and was started on a Cardizem drip for rate control.  Chest x-ray showed pulmonary edema and BNP is elevated consistent with acute CHF.  Room air pulse oximetry was in the low 80s at rest and patient is currently on 4 L of oxygen to maintain pulse oximetry greater than 92%.  She received a dose of Lasix and will be admitted to the hospital for further evaluation.   Assessment & Plan:   Principal Problem:   Atrial fibrillation with RVR (HCC) Active Problems:   Diabetes mellitus (HCC)   HTN (hypertension)   Sleep apnea   Obesity, Class III, BMI 40-49.9 (morbid obesity) (HCC)   Depression   Respiratory failure, acute (HCC)   Acute diastolic CHF (congestive heart failure) ([place])  # Atrial fibrillation with RVR Patient with a history of recently diagnosed A. fib discharged home on sotalol who presents to the ER for evaluation of shortness of breath and palpitations and noted to be in atrial fibrillation with rapid ventricular rate. Cardioversion attempted this AM, unsuccessful - defer rate control to cardiology, currently on cardizem oral and gtt, sotolol - cont apixaban   # Acute diastolic dysfunction CHF Most likely 2/2 rvr. Echo w/o sig abnormalities, does have hx diastolic dysfunction - cont lasix 40 iv bid - strict I/os - rate control as below  # Acute respiratory failure Secondary to acute diastolic dysfunction CHF from a fib w/  rvr. Patient was tachypneic and noted to have room air pulse oximetry in the low 80s. She is currently on 4 L of oxygen with improvement in her pulse oximetry to greater than 92% - treat as above - wean o2 as able  # Diabetes mellitus Maintain consistent carbohydrate diet - Hold Metformin - Glycemic control with sliding scale insulin  # Hypertension BP low normal on dilt gtt - Continue diltiazem and sotalol  # Dyslipidemia - Continue statin  # Hypokalemia 3.2  - kcl 20 bid - f/u mg  # OSA - needs outpatient titration study and start of cpap, that is planned   DVT prophylaxis: apixaban Code Status: full Family Communication: husband updated @ bedside 12/14  Status is: Inpatient  Remains inpatient appropriate because:Inpatient level of care appropriate due to severity of illness   Dispo: The patient is from: Home              Anticipated d/c is to: Home              Anticipated d/c date is: 1-3 days              Patient currently is not medically stable to d/c.        Consultants:  cardiology  Procedures: D/c cardioversion 12/14  Antimicrobials:  none    Subjective: This morning no chest pain or palpitations, sob improved, has appetite no n/v/d.  Objective: Vitals:   06/29/20 0900 06/29/20 0930 06/29/20 0945 06/29/20 1000  BP: 120/80 108/71  118/62  Pulse: 86 71 (!) 111 (!) 116  Resp: 17 16 15 19   Temp:      TempSrc:      SpO2: 97%  93% 95%  Weight:      Height:        Intake/Output Summary (Last 24 hours) at 06/29/2020 1010 Last data filed at 06/29/2020 8563 Gross per 24 hour  Intake 273.3 ml  Output 3550 ml  Net -3276.7 ml   Filed Weights   06/28/20 0522 06/29/20 0750  Weight: 104.3 kg 99.8 kg    Examination:  General exam: Appears calm and comfortable  Respiratory system: faint rales w/ deep inspiration Cardiovascular system: S1 & S2 heard, soft systolic murmur, tachycardic, irreg irreg Gastrointestinal system: Abdomen is  nondistended, soft and nontender. No organomegaly or masses felt. Normal bowel sounds heard. Central nervous system: Alert and oriented. No focal neurological deficits. Extremities: Symmetric 5 x 5 power. Skin: No rashes, lesions or ulcers, trace LE pitting edema Psychiatry: Judgement and insight appear normal. Mood & affect appropriate.     Data Reviewed: I have personally reviewed following labs and imaging studies  CBC: Recent Labs  Lab 06/28/20 0539 06/29/20 0427  WBC 13.6* 14.1*  HGB 14.1 14.1  HCT 43.1 43.2  MCV 94.1 92.7  PLT 505* 149*   Basic Metabolic Panel: Recent Labs  Lab 06/28/20 0539 06/29/20 0427  NA 142 142  K 3.8 3.2*  CL 106 100  CO2 25 31  GLUCOSE 132* 122*  BUN 20 20  CREATININE 0.76 0.81  CALCIUM 9.2 9.1   GFR: Estimated Creatinine Clearance: 69.5 mL/min (by C-G formula based on SCr of 0.81 mg/dL). Liver Function Tests: Recent Labs  Lab 06/28/20 0539  AST 35  ALT 44  ALKPHOS 65  BILITOT 1.1  PROT 7.4  ALBUMIN 3.6   No results for input(s): LIPASE, AMYLASE in the last 168 hours. No results for input(s): AMMONIA in the last 168 hours. Coagulation Profile: Recent Labs  Lab 06/28/20 1116  INR 1.3*   Cardiac Enzymes: No results for input(s): CKTOTAL, CKMB, CKMBINDEX, TROPONINI in the last 168 hours. BNP (last 3 results) No results for input(s): PROBNP in the last 8760 hours. HbA1C: No results for input(s): HGBA1C in the last 72 hours. CBG: Recent Labs  Lab 06/28/20 0938 06/28/20 1308 06/28/20 1615 06/29/20 1002  GLUCAP 102* 101* 102* 98   Lipid Profile: No results for input(s): CHOL, HDL, LDLCALC, TRIG, CHOLHDL, LDLDIRECT in the last 72 hours. Thyroid Function Tests: No results for input(s): TSH, T4TOTAL, FREET4, T3FREE, THYROIDAB in the last 72 hours. Anemia Panel: No results for input(s): VITAMINB12, FOLATE, FERRITIN, TIBC, IRON, RETICCTPCT in the last 72 hours. Urine analysis:    Component Value Date/Time   COLORURINE  YELLOW 03/30/2016 0424   APPEARANCEUR CLEAR 03/30/2016 0424   LABSPEC 1.020 03/30/2016 0424   PHURINE 7.5 03/30/2016 0424   GLUCOSEU NEGATIVE 03/30/2016 0424   HGBUR NEGATIVE 03/30/2016 0424   BILIRUBINUR NEGATIVE 03/30/2016 0424   KETONESUR NEGATIVE 03/30/2016 0424   PROTEINUR >300 (A) 03/30/2016 0424   UROBILINOGEN 0.2 04/26/2008 1526   NITRITE NEGATIVE 03/30/2016 0424   LEUKOCYTESUR NEGATIVE 03/30/2016 0424   Sepsis Labs: @LABRCNTIP (procalcitonin:4,lacticidven:4)  ) Recent Results (from the past 240 hour(s))  Resp Panel by RT-PCR (Flu A&B, Covid) Nasopharyngeal Swab     Status: None   Collection Time: 06/28/20  5:40 AM   Specimen: Nasopharyngeal Swab; Nasopharyngeal(NP) swabs in vial transport medium  Result Value Ref Range Status   SARS Coronavirus  2 by RT PCR NEGATIVE NEGATIVE Final    Comment: (NOTE) SARS-CoV-2 target nucleic acids are NOT DETECTED.  The SARS-CoV-2 RNA is generally detectable in upper respiratory specimens during the acute phase of infection. The lowest concentration of SARS-CoV-2 viral copies this assay can detect is 138 copies/mL. A negative result does not preclude SARS-Cov-2 infection and should not be used as the sole basis for treatment or other patient management decisions. A negative result may occur with  improper specimen collection/handling, submission of specimen other than nasopharyngeal swab, presence of viral mutation(s) within the areas targeted by this assay, and inadequate number of viral copies(<138 copies/mL). A negative result must be combined with clinical observations, patient history, and epidemiological information. The expected result is Negative.  Fact Sheet for Patients:  EntrepreneurPulse.com.au  Fact Sheet for Healthcare Providers:  IncredibleEmployment.be  This test is no t yet approved or cleared by the [place] FDA and  has been authorized for detection and/or diagnosis of  SARS-CoV-2 by FDA under an Emergency Use Authorization (EUA). This EUA will remain  in effect (meaning this test can be used) for the duration of the COVID-19 declaration under Section 564(b)(1) of the Act, 21 U.S.C.section 360bbb-3(b)(1), unless the authorization is terminated  or revoked sooner.       Influenza A by PCR NEGATIVE NEGATIVE Final   Influenza B by PCR NEGATIVE NEGATIVE Final    Comment: (NOTE) The Xpert Xpress SARS-CoV-2/FLU/RSV plus assay is intended as an aid in the diagnosis of influenza from Nasopharyngeal swab specimens and should not be used as a sole basis for treatment. Nasal washings and aspirates are unacceptable for Xpert Xpress SARS-CoV-2/FLU/RSV testing.  Fact Sheet for Patients: EntrepreneurPulse.com.au  Fact Sheet for Healthcare Providers: IncredibleEmployment.be  This test is not yet approved or cleared by the [place] FDA and has been authorized for detection and/or diagnosis of SARS-CoV-2 by FDA under an Emergency Use Authorization (EUA). This EUA will remain in effect (meaning this test can be used) for the duration of the COVID-19 declaration under Section 564(b)(1) of the Act, 21 U.S.C. section 360bbb-3(b)(1), unless the authorization is terminated or revoked.  Performed at [facility], [address], [place], [place] [address]          Radiology Studies: [facility] Chest [place] 1 View  Result Date: 06/28/2020 CLINICAL DATA:  Shortness of breath. Recently diagnosed with atrial fibrillation. 10 pound weight gain in the past week. EXAM: PORTABLE CHEST 1 VIEW COMPARISON:  06/16/2020 FINDINGS: The cardiac silhouette is borderline enlarged. Aortic atherosclerosis is noted. There is new pulmonary vascular congestion with bilateral interstitial lung opacities. No sizable pleural effusion or pneumothorax is identified. No acute osseous abnormality is seen. IMPRESSION: New pulmonary vascular congestion  and interstitial opacities likely reflecting edema. Electronically Signed   By: Logan Bores M.D.   On: 06/28/2020 05:52   ECHO TEE  Result Date: 06/29/2020    TRANSESOPHOGEAL ECHO REPORT   Patient Name:   Tammy [name] [facility] Date of Exam: 06/29/2020 Medical Rec #:  [id number]            Height:       60.0 in Accession #:    [id number]           Weight:       230.0 lb Date of Birth:  02-06-1951           BSA:          1.981 m Patient Age:    84 years  BP:           134/77 mmHg Patient Gender: F                    HR:           102 bpm. Exam Location:  [facility] Procedure: Transesophageal Echo, Color Doppler and Cardiac Doppler Indications:     Atrial Fibrillation 427.31  History:         Patient has prior history of Echocardiogram examinations, most                  recent 06/17/2020. Risk Factors:Hypertension and Diabetes.  Sonographer:     Sherrie Sport RDCS (AE) Referring Phys:  [place] Diagnosing Phys: Neoma Laming MD PROCEDURE: The transesophogeal probe was passed without difficulty through the esophogus of the patient. Sedation performed by different physician. The patient developed no complications during the procedure. IMPRESSIONS  1. Left ventricular ejection fraction, by estimation, is 55 to 60%. The left ventricle has normal function. The left ventricle demonstrates regional wall motion abnormalities (see scoring diagram/findings for description).  2. Right ventricular systolic function is mildly reduced. The right ventricular size is normal. Mildly increased right ventricular wall thickness.  3. Left atrial size was mild to moderately dilated. No left atrial/left atrial appendage thrombus was detected.  4. Right atrial size was mildly dilated.  5. The mitral valve is normal in structure. Trivial mitral valve regurgitation.  6. The aortic valve is normal in structure. Aortic valve regurgitation is trivial. FINDINGS  Left Ventricle: Left ventricular ejection fraction, by estimation, is  55 to 60%. The left ventricle has normal function. The left ventricle demonstrates regional wall motion abnormalities. The left ventricular internal cavity size was normal in size. Right Ventricle: The right ventricular size is normal. Mildly increased right ventricular wall thickness. Right ventricular systolic function is mildly reduced. Left Atrium: Left atrial size was mild to moderately dilated. No left atrial/left atrial appendage thrombus was detected. Right Atrium: Right atrial size was mildly dilated. Pericardium: The pericardium was not well visualized. Mitral Valve: The mitral valve is normal in structure. Trivial mitral valve regurgitation. Tricuspid Valve: The tricuspid valve is grossly normal. Tricuspid valve regurgitation is trivial. Aortic Valve: The aortic valve is normal in structure. Aortic valve regurgitation is trivial. Pulmonic Valve: The pulmonic valve was normal in structure. Pulmonic valve regurgitation is not visualized. Aorta: The aortic root was not well visualized. IAS/Shunts: No atrial level shunt detected by color flow Doppler. Neoma Laming MD Electronically signed by Neoma Laming MD Signature Date/Time: 06/29/2020/8:39:03 AM    Final         Scheduled Meds: . apixaban  5 mg Oral BID  . Bempedoic Acid  180 mg Oral Daily  . butamben-tetracaine-benzocaine      . diltiazem  240 mg Oral Daily  . docusate sodium  100 mg Oral Weekly  . fesoterodine  4 mg Oral Daily  . fluorouracil  1 application Topical BID  . furosemide  40 mg Intravenous BID  . insulin aspart  0-20 Units Subcutaneous TID WC  . lidocaine      . multivitamin-lutein  1 capsule Oral Daily  . pravastatin  40 mg Oral QPM  . sodium chloride flush      . sotalol  80 mg Oral Q12H   Continuous Infusions: . sodium chloride Stopped (06/28/20 1113)  . diltiazem (CARDIZEM) infusion 12.5 mg/hr (06/29/20 0614)     LOS: 1 day  Time spent: 40 min    Desma Maxim, MD [facility]   If 7PM-7AM,  please contact night-coverage [email or web address] Password [email or web address] 06/29/2020, 10:10 AM

## 2020-06-29 NOTE — Anesthesia Preprocedure Evaluation (Signed)
Anesthesia Evaluation  Patient identified by MRN, date of birth, ID band Patient awake    Reviewed: Allergy & Precautions, H&P , NPO status , Patient's Chart, lab work & pertinent test results  History of Anesthesia Complications Negative for: history of anesthetic complications  Airway Mallampati: II  TM Distance: >3 FB     Dental  (+) Chipped   Pulmonary sleep apnea , COPD,  COPD inhaler, former smoker,    breath sounds clear to auscultation       Cardiovascular hypertension, (-) angina+ CAD and +CHF  (-) Past MI and (-) Cardiac Stents (-) dysrhythmias  Rhythm:irregular Rate:Tachycardia  From Cardiology consult note: 69 year old female with past medical history of atrial fibrillation, hypertension, hyperlipidemia, sleep apnea and DMII. Patient is presenting to the hospital with palpitations and worsening dyspnea over three days. Patient recently hospitalized just over 1 week ago when she was first diagnosed with A. fib with RVR.  Patient was discharged and seen in the office and at that time remained in atrial fibrillation but was rate controlled.  Patient now back in Atrial fibrillation with RVR accompanied by HFpEF exacerbation. Patient was started on a Cardizem infusion.  Echocardiogram from last admission showed EF 50 to 55% with mild LVH and grade I diastolic dysfunction    Neuro/Psych PSYCHIATRIC DISORDERS Depression negative neurological ROS     GI/Hepatic negative GI ROS, Neg liver ROS,   Endo/Other  diabetes  Renal/GU negative Renal ROS  negative genitourinary   Musculoskeletal   Abdominal   Peds  Hematology negative hematology ROS (+)   Anesthesia Other Findings Past Medical History: No date: [place] coronary artery calcium score greater than 400 No date: Diabetes mellitus (HCC)     Comment:  x 3 years No date: DJD (degenerative joint disease) No date: Encephalitis No date: Fibromyalgia No date: HTN  (hypertension) No date: Hyperlipidemia No date: Sleep apnea     Comment:  No CPAP  Past Surgical History: No date: APPENDECTOMY No date: BREAST CYST EXCISION 2004: BREAST EXCISIONAL BIOPSY; Left No date: KNEE ARTHROSCOPY  BMI    Body Mass Index: 42.97 kg/m      Reproductive/Obstetrics negative OB ROS                             Anesthesia Physical Anesthesia Plan  ASA: III  Anesthesia Plan: General   Post-op Pain Management:    Induction:   PONV Risk Score and Plan:   Airway Management Planned: Simple Face Mask  Additional Equipment:   Intra-op Plan:   Post-operative Plan:   Informed Consent: I have reviewed the patients History and Physical, chart, labs and discussed the procedure including the risks, benefits and alternatives for the proposed anesthesia with the patient or authorized representative who has indicated his/her understanding and acceptance.     Dental Advisory Given  Plan Discussed with: Anesthesiologist, CRNA and Surgeon  Anesthesia Plan Comments:         Anesthesia Quick Evaluation

## 2020-06-29 NOTE — Plan of Care (Signed)
Patient admitted to the floor from ED, alert and oriented, patient on cardizem drip rate 12.88ml/her, denies any pain  at this time, VSS , will continue to monitor .   Problem: Clinical Measurements: Goal: Ability to maintain clinical measurements within normal limits will improve Outcome: Progressing   Problem: Clinical Measurements: Goal: Diagnostic test results will improve Outcome: Progressing   Problem: Clinical Measurements: Goal: Respiratory complications will improve Outcome: Progressing

## 2020-06-29 NOTE — Progress Notes (Addendum)
   Heart Failure Nurse Navigator Note  HFpEF 55-60% LVH. Diastolic dysfunction.  RVSF mildly reduced.  Mild to moderate LAE.  Mild RAE.  She presented with progressive shortness of breath, weight gain of 10 pounds over several days and mild lower extremity edema.    Co morbidities:  Atrial fibrillation Hypertension Hyperlipidemia Sleep apnea Type 2 diabetes   Medications:  Cardizem infusion Furosemide 40 mg IV twice daily Close 5 mg twice daily Cardizem CD 240 mg daily Potassium chloride 20 mEq 2 times daily Pravachol 40 mg every evening Sotalol 80 mg every 12 hours   Labs:  Sodium 142, potassium 3.2, chloride 100, CO2 31, BUN 20, creatinine 0.81, magnesium 1.9, globin 14 1, adequate 43.2.  Intake 273 mL Output 4550 L Blood pressure 121/80 Weight 99.4 kg BMI 42.79    Assessment:   General-she is awake and alert sitting up in the bed.  Husband is present at the bedside.  A little disappointed that her cardioversion did not take.   HEENT-pupils are equal,normocephalic.  No JVD.   Cardiac-tones are irregular, S1-S2, no murmurs or rubs appreciated.   Chest-breath sounds are clear to posterior auscultation.  Abdomen is soft nontender.  Positive  bowel sounds.  Musculoskeletal there is no lower extremity edema, pedal pulses are palpable.  Psych she is pleasant and appropriate, talkative.  Neurologic-she moves all extremities without difficulty.  Speech is clear.     Follow-up visit today with patient and husband.  Discussed  the relationship between obstructive sleep apnea untreated and atrial fibrillation.  They voiced understanding.  Also discussed with them today and reinforced the importance of daily weights and recording, reporting weight gain of 2 pounds overnight or 5 pounds within a week to physician.  Also stressed the importance of low-sodium diet and removing the saltshaker from the table.  Also talked about monitoring her heart rates either  with a pulse ox or blood pressure machine.  She voiced understanding.  She was given an appointment to see Darylene Price NP in the outpatient heart failure clinic on December 27 at [place], [facility]

## 2020-06-29 NOTE — Anesthesia Procedure Notes (Signed)
Date/Time: 06/29/2020 8:15 AM Performed by: Tera Mater, MD Pre-anesthesia Checklist: Patient identified, Emergency Drugs available, Suction available, Patient being monitored and Timeout performed Patient Re-evaluated:Patient Re-evaluated prior to induction Oxygen Delivery Method: Nasal cannula and Simple face mask Preoxygenation: Pre-oxygenation with 100% oxygen Placement Confirmation: positive ETCO2 Comments: Used POM mask

## 2020-06-29 NOTE — Transfer of Care (Signed)
Immediate Anesthesia Transfer of Care Note  Patient: Tammy [name]  Procedure(s) Performed: TRANSESOPHAGEAL ECHOCARDIOGRAM (TEE) with DCCV (N/A )  Patient Location: PACU  Anesthesia Type:General  Level of Consciousness: awake, alert  and oriented  Airway & Oxygen Therapy: Patient Spontanous Breathing and Patient connected to face mask oxygen  Post-op Assessment: Report given to RN and Post -op Vital signs reviewed and stable  Post vital signs: Reviewed and stable  Last Vitals:  Vitals Value Taken Time  BP 103/74 06/29/20 0838  Temp    Pulse 116 06/29/20 0838  Resp 22 06/29/20 0838  SpO2 96 % 06/29/20 0838    Last Pain:  Vitals:   06/29/20 0750  TempSrc: Oral  PainSc:          Complications: No complications documented.

## 2020-06-29 NOTE — Progress Notes (Signed)
  NAME:  Tammy [name]   MRN: [id number] DOB:  05/22/1951   ADMIT DATE: 06/28/2020  Procedure: Electrical Cardioversion Indications:  Atrial Fibrillation  Procedure Details:    Time Out: Verified patient identification, verified procedure, site/side was marked, verified correct patient position, special equipment/implants available, medications/allergies/relevent history reviewed, required imaging and test results available.    Patient placed on cardiac monitor, pulse oximetry, supplemental oxygen as necessary.  Sedation given:  Pacer pads placed   Cardioverted . At 200 j 3 times Cardioverted at NSR but went back to afib  Evaluation: Findings: Post procedure EKG shows: afib Complications: none Patient did well.     Dionisio David, M.D. [facility]   06/29/2020 8:30 AM

## 2020-06-29 NOTE — ED Notes (Signed)
Report given to [place], Therapist, sports. Pt taking for procedure at this time

## 2020-06-29 NOTE — Anesthesia Postprocedure Evaluation (Signed)
Anesthesia Post Note  Patient: Tammy [name]  Procedure(s) Performed: TRANSESOPHAGEAL ECHOCARDIOGRAM (TEE) with DCCV (N/A )  Patient location during evaluation: PACU Anesthesia Type: General Level of consciousness: awake and alert Pain management: pain level controlled Vital Signs Assessment: post-procedure vital signs reviewed and stable Respiratory status: spontaneous breathing, nonlabored ventilation and respiratory function stable Cardiovascular status: blood pressure returned to baseline and stable Postop Assessment: no apparent nausea or vomiting Anesthetic complications: no   No complications documented.   Last Vitals:  Vitals:   06/29/20 0945 06/29/20 1000  BP:  118/62  Pulse: (!) 111 (!) 116  Resp: 15 19  Temp:    SpO2: 93% 95%    Last Pain:  Vitals:   06/29/20 1007  TempSrc:   PainSc: 0-No pain                 Brett Canales Dior Dominik

## 2020-06-29 NOTE — Progress Notes (Signed)
*  PRELIMINARY RESULTS* Echocardiogram Echocardiogram Transesophageal has been performed.  Sherrie Sport 06/29/2020, 8:34 AM

## 2020-06-29 NOTE — Progress Notes (Signed)
SUBJECTIVE:tolerated TEE/Cardioversion   Vitals:   06/29/20 0713 06/29/20 0717 06/29/20 0750 06/29/20 0803  BP:  134/77 108/80 108/80  Pulse: (!) 121 (!) 102 99 (!) 114  Resp: 14 16 20 18   Temp:   97.9 F (36.6 C)   TempSrc:   Oral   SpO2: 94% 95% 93% 93%  Weight:   99.8 kg   Height:   5' (1.524 m)     Intake/Output Summary (Last 24 hours) at 06/29/2020 0831 Last data filed at 06/29/2020 1219 Gross per 24 hour  Intake 273.3 ml  Output 4550 ml  Net -4276.7 ml    LABS: Basic Metabolic Panel: Recent Labs    06/28/20 0539 06/29/20 0427  NA 142 142  K 3.8 3.2*  CL 106 100  CO2 25 31  GLUCOSE 132* 122*  BUN 20 20  CREATININE 0.76 0.81  CALCIUM 9.2 9.1   Liver Function Tests: Recent Labs    06/28/20 0539  AST 35  ALT 44  ALKPHOS 65  BILITOT 1.1  PROT 7.4  ALBUMIN 3.6   No results for input(s): LIPASE, AMYLASE in the last 72 hours. CBC: Recent Labs    06/28/20 0539 06/29/20 0427  WBC 13.6* 14.1*  HGB 14.1 14.1  HCT 43.1 43.2  MCV 94.1 92.7  PLT 505* 451*   Cardiac Enzymes: No results for input(s): CKTOTAL, CKMB, CKMBINDEX, TROPONINI in the last 72 hours. BNP: Invalid input(s): POCBNP D-Dimer: No results for input(s): DDIMER in the last 72 hours. Hemoglobin A1C: No results for input(s): HGBA1C in the last 72 hours. Fasting Lipid Panel: No results for input(s): CHOL, HDL, LDLCALC, TRIG, CHOLHDL, LDLDIRECT in the last 72 hours. Thyroid Function Tests: No results for input(s): TSH, T4TOTAL, T3FREE, THYROIDAB in the last 72 hours.  Invalid input(s): FREET3 Anemia Panel: No results for input(s): VITAMINB12, FOLATE, FERRITIN, TIBC, IRON, RETICCTPCT in the last 72 hours.   PHYSICAL EXAM General: Well developed, well nourished, in no acute distress HEENT:  Normocephalic and atramatic Neck:  No JVD.  Lungs: Clear bilaterally to auscultation and percussion. Heart: HRRR . Normal S1 and S2 without gallops or murmurs.  Abdomen: Bowel sounds are  positive, abdomen soft and non-tender  Msk:  Back normal, normal gait. Normal strength and tone for age. Extremities: No clubbing, cyanosis or edema.   Neuro: Alert and oriented X 3. Psych:  Good affect, responds appropriately  TELEMETRY: afib ASSESSMENT AND PLAN: Unsuccessfull after 3 times with cardioversion as has to have sleep apnoea treated. Rate control till then. Converted to 200 j  But went back to afib.  Principal Problem:   Atrial fibrillation with RVR (HCC) Active Problems:   Diabetes mellitus (HCC)   HTN (hypertension)   Sleep apnea   Obesity, Class III, BMI 40-49.9 (morbid obesity) (HCC)   Depression   Respiratory failure, acute (HCC)   Acute diastolic CHF (congestive heart failure) (HCC)    Tammy Viger A, MD, [facility] 06/29/2020 8:31 AM

## 2020-06-29 NOTE — ED Notes (Signed)
MD messaged to place diet order

## 2020-06-30 LAB — BASIC METABOLIC PANEL
Anion gap: 11 (ref 5–15)
BUN: 25 mg/dL — ABNORMAL HIGH (ref 8–23)
CO2: 30 mmol/L (ref 22–32)
Calcium: 8.9 mg/dL (ref 8.9–10.3)
Chloride: 100 mmol/L (ref 98–111)
Creatinine, Ser: 0.81 mg/dL (ref 0.44–1.00)
GFR, Estimated: >60 mL/min (ref >60)
Glucose, Bld: 120 mg/dL — ABNORMAL HIGH (ref 70–99)
Potassium: 3 mmol/L — ABNORMAL LOW (ref 3.5–5.1)
Sodium: 141 mmol/L (ref 135–145)

## 2020-06-30 LAB — MAGNESIUM: Magnesium: 1.9 mg/dL (ref 1.7–2.4)

## 2020-06-30 LAB — GLUCOSE, CAPILLARY
Glucose-Capillary: 116 mg/dL — ABNORMAL HIGH (ref 70–99)
Glucose-Capillary: 92 mg/dL (ref 70–99)
Glucose-Capillary: 108 mg/dL — ABNORMAL HIGH (ref 70–99)

## 2020-06-30 MED ORDER — DIGOXIN 250 MCG PO TABS
0.2500 mg | ORAL_TABLET | Freq: Every day | ORAL | Status: DC
  Administered 2020-06-30: 0.25 mg via ORAL
  Filled 2020-06-30: qty 1

## 2020-06-30 MED ORDER — FUROSEMIDE 20 MG PO TABS: 20.0000 mg | ORAL_TABLET | Freq: Two times a day (BID) | ORAL | 1 refills | Status: DC

## 2020-06-30 MED ORDER — FUROSEMIDE 20 MG PO TABS: 20.0000 mg | ORAL_TABLET | Freq: Two times a day (BID) | ORAL | Status: DC

## 2020-06-30 MED ORDER — DIGOXIN 250 MCG PO TABS: 0.2500 mg | ORAL_TABLET | Freq: Every day | ORAL | 1 refills | Status: DC

## 2020-06-30 MED ORDER — POTASSIUM CHLORIDE CRYS ER 20 MEQ PO TBCR: 40.0000 meq | EXTENDED_RELEASE_TABLET | Freq: Every day | ORAL | 0 refills | Status: DC

## 2020-06-30 MED ORDER — POTASSIUM CHLORIDE CRYS ER 20 MEQ PO TBCR
40.0000 meq | EXTENDED_RELEASE_TABLET | Freq: Two times a day (BID) | ORAL | Status: DC
  Administered 2020-06-30: 40 meq via ORAL
  Filled 2020-06-30: qty 2

## 2020-06-30 NOTE — Progress Notes (Signed)
Patient discharged at this time via wheelchair accompanied with spouse to home with all belongings. PIVs/Tele removed by Probation officer. Patient verbalized understanding of discharge instructions and follow up appointments/referrals. NAD noted, no further questions/concerns verbalized.

## 2020-06-30 NOTE — Discharge Summary (Signed)
Physician Discharge Summary  Tammy Tammy [name] VOJ:[id number] DOB: 28-Nov-1950 DOA: 06/28/2020  PCP: Tammy Redwood, MD  Admit date: 06/28/2020 Discharge date: 06/30/2020  Admitted From: home Disposition:  home  Recommendations for Outpatient Follow-up:  1. Follow up with PCP in 1-2 weeks 2. Please obtain BMP/CBC in one week 3. Please follow up with cardiology, Tammy Tammy [name], on Monday next week 4. Please follow up on patient's BP.  Amlodipine was held and then stopped at time of discharge as pt was normotensive with it held.  May need to resume if BP again elevated after discharge.  Home Health: No  Equipment/Devices: None   Discharge Condition: Stable  CODE STATUS: Full  Diet recommendation: Heart Healthy / Carb Modified    Discharge Diagnoses: Principal Problem:   Atrial fibrillation with RVR (HCC) Active Problems:   Diabetes mellitus (HCC)   HTN (hypertension)   Sleep apnea   Obesity, Class III, BMI 40-49.9 (morbid obesity) (HCC)   Depression   Respiratory failure, acute (HCC)   Acute diastolic CHF (congestive heart failure) (HCC)    Summary of HPI and Tammy [name] Course:   Per Tammy Tammy [name] 12/14: "Patient is a 69 year old Caucasian female recently diagnosed with new onset A. fib who presents to the emergency room for evaluation of a 3-day history of worsening shortness of breath associated with leg swelling, diaphoresis and palpitations. She admits to 10 pound weight gain in the last couple of days. Patient was noted to be in rapid atrial fibrillation when she arrived the ER and was started on a Cardizem drip for rate control. Chest x-ray showed pulmonary edema and BNP is elevated consistent with acute CHF. Room air pulse oximetry was in the low 80s at rest and patient is currently on 4 L of oxygen to maintain pulse oximetry greater than 92%. She received a dose of Lasix and will be admitted to the Tammy [name] for further evaluation."  Patient was treated for A-fib with RVR and  acute decompensation of diastolic CHF and associated acute hypoxic respiratory failure.    Cardiology, Tammy Tammy [name] was consulted.   Placed on Cardizem drip with good heart rate control. TEE with Cardioversion was done but unsuccessful in maintaining normal sinus rhythm. TEE showed EF 55-60%. Started on Digoxin. Diuresed with IV Lasix.  Patient clinically improved and stable for discharge.  Is to have close follow up Tammy Tammy [name] on Monday.    Discharge Instructions   Discharge Instructions    (HEART FAILURE PATIENTS) Call MD:  Anytime you have any of the following symptoms: 1) 3 pound weight gain in 24 hours or 5 pounds in 1 week 2) shortness of breath, with or without a dry hacking cough 3) swelling in the hands, feet or stomach 4) if you have to sleep on extra pillows at night in order to breathe.   Complete by: As directed    Amb referral to [facility]   Complete by: As directed    Call MD for:  extreme fatigue   Complete by: As directed    Call MD for:  persistant dizziness or light-headedness   Complete by: As directed    Call MD for:  persistant nausea and vomiting   Complete by: As directed    Call MD for:  temperature >100.4   Complete by: As directed    Diet - low sodium heart healthy   Complete by: As directed    Discharge instructions   Complete by: As directed    Tammy Tammy [name] said he will  see you in his clinic on Monday to follow up.   Please take all medications as prescribed.    We stopped your amlodipine for now, because your BP is normal with it being held.  If you have a BP monitor at home, please check once a day, write them down and bring to follow up doctor appointments.  They may need to restart amlodipine at some point.   Increase activity slowly   Complete by: As directed      Allergies as of 06/30/2020      Reactions   Betadine [povidone Iodine] Anaphylaxis   Contrast Media [iodinated Diagnostic Agents] Anaphylaxis   Iodine Anaphylaxis   Metrizamide  Anaphylaxis   Povidone-iodine Anaphylaxis   Shellfish Allergy Anaphylaxis   Hydrocodone-acetaminophen Nausea Only      Medication List    STOP taking these medications   amLODipine 10 MG tablet Commonly known as: NORVASC   naproxen sodium 220 MG tablet Commonly known as: ALEVE     TAKE these medications   apixaban 5 MG Tabs tablet Commonly known as: ELIQUIS Take 1 tablet (5 mg total) by mouth 2 (two) times daily.   Bempedoic Acid 180 MG Tabs Take 180 mg by mouth daily.   beta carotene w/minerals tablet Take 1 tablet by mouth daily.   BYDUREON [place] Inject 1 Dose into the skin once a week.   digoxin 0.25 MG tablet Commonly known as: LANOXIN Take 1 tablet (0.25 mg total) by mouth daily. Start taking on: July 01, 2020   diltiazem 240 MG 24 hr capsule Commonly known as: CARDIZEM CD Take 1 capsule (240 mg total) by mouth daily.   DSS 100 MG Caps Take 100 mg by mouth once a week.   DULoxetine 60 MG capsule Commonly known as: CYMBALTA Take 60 mg by mouth daily.   EPINEPHrine 0.3 mg/0.3 mL Soaj injection Commonly known as: EPI-PEN Inject 0.3 mg into the muscle as needed for anaphylaxis.   fesoterodine 4 MG Tb24 tablet Commonly known as: TOVIAZ Take 4 mg by mouth daily.   fluorouracil 5 % cream Commonly known as: EFUDEX Apply 1 application topically.   furosemide 20 MG tablet Commonly known as: LASIX Take 1 tablet (20 mg total) by mouth 2 (two) times daily.   metFORMIN 1000 MG tablet Commonly known as: GLUCOPHAGE Take 1,000 mg by mouth daily with breakfast.   potassium chloride SA 20 MEQ tablet Commonly known as: KLOR-CON Take 2 tablets (40 mEq total) by mouth daily.   pravastatin 40 MG tablet Commonly known as: PRAVACHOL Take 1 tablet (40 mg total) by mouth every evening.   Repatha SureClick 884 MG/ML Soaj Generic drug: Evolocumab Inject 1 Dose into the skin every 14 (fourteen) days.   sotalol 80 MG tablet Commonly known as: BETAPACE Take 1  tablet (80 mg total) by mouth every 12 (twelve) hours.   valACYclovir 500 MG tablet Commonly known as: VALTREX Take 500 mg by mouth daily.       Follow-up Information    [facility] Follow up on 07/12/2020.   Specialty: Cardiology Why: at [place] through the [place] entrance Contact information: [place]             Allergies  Allergen Reactions  . Betadine [Povidone Iodine] Anaphylaxis  . Contrast Media [Iodinated Diagnostic Agents] Anaphylaxis  . Iodine Anaphylaxis  . Metrizamide Anaphylaxis  . Povidone-Iodine Anaphylaxis  . Shellfish Allergy  Anaphylaxis  . Hydrocodone-Acetaminophen Nausea Only    Consultations:  Cardiology    Procedures/Studies: DG Chest Port 1 View  Result Date: 06/28/2020 CLINICAL DATA:  Shortness of breath. Recently diagnosed with atrial fibrillation. 10 pound weight gain in the past week. EXAM: PORTABLE CHEST 1 VIEW COMPARISON:  06/16/2020 FINDINGS: The cardiac silhouette is borderline enlarged. Aortic atherosclerosis is noted. There is new pulmonary vascular congestion with bilateral interstitial lung opacities. No sizable pleural effusion or pneumothorax is identified. No acute osseous abnormality is seen. IMPRESSION: New pulmonary vascular congestion and interstitial opacities likely reflecting edema. Electronically Signed   By: Logan Bores M.D.   On: 06/28/2020 05:52   DG Chest Portable 1 View  Result Date: 06/16/2020 CLINICAL DATA:  Chest pain, dyspnea EXAM: PORTABLE CHEST 1 VIEW COMPARISON:  05/17/2020 FINDINGS: Lungs are well expanded, symmetric, and clear. No pneumothorax or pleural effusion. Cardiac size within normal limits. Pulmonary vascularity is normal. Osseous structures are age-appropriate. No acute bone abnormality. IMPRESSION: No active disease. Electronically Signed   By: Fidela Salisbury MD   On: 06/16/2020  22:25   ECHOCARDIOGRAM COMPLETE  Result Date: 06/17/2020    ECHOCARDIOGRAM REPORT   Patient Name:   Tammy Tammy [name] Date of Exam: 06/17/2020 Medical Rec #:  [id number]            Height:       60.0 in Accession #:    [id number]           Weight:       220.0 lb Date of Birth:  [birth year]           BSA:          1.944 m Patient Age:    76 years             BP:           100/67 mmHg Patient Gender: F                    HR:           94 bpm. Exam Location:  [facility] Procedure: 2D Echo, Cardiac Doppler and Color Doppler Indications:     Atrial Fibrillation 427.31  History:         Patient has no prior history of Echocardiogram examinations.                  Risk Factors:Hypertension, Dyslipidemia and Diabetes.  Sonographer:     Sherrie Sport RDCS (AE) Referring Phys:  [id number] [place] Diagnosing Phys: Neoma Laming MD IMPRESSIONS  1. Left ventricular ejection fraction, by estimation, is 50 to 55%. The left ventricle has low normal function. The left ventricle has no regional wall motion abnormalities. The left ventricular internal cavity size was mildly dilated. There is mild left ventricular hypertrophy. Left ventricular diastolic parameters are consistent with Grade I diastolic dysfunction (impaired relaxation).  2. Right ventricular systolic function is normal. The right ventricular size is mildly enlarged.  3. Left atrial size was moderately dilated.  4. Right atrial size was mild to moderately dilated.  5. The mitral valve is normal in structure. No evidence of mitral valve regurgitation. No evidence of mitral stenosis.  6. The aortic valve is normal in structure. Aortic valve regurgitation is not visualized. No aortic stenosis is present.  7. The inferior vena cava is normal in size with greater than 50% respiratory variability, suggesting right atrial pressure of 3 mmHg. FINDINGS  Left Ventricle: Left ventricular ejection fraction,  by estimation, is 50 to 55%. The left ventricle has low normal function. The  left ventricle has no regional wall motion abnormalities. The left ventricular internal cavity size was mildly dilated. There is mild left ventricular hypertrophy. Left ventricular diastolic parameters are consistent with Grade I diastolic dysfunction (impaired relaxation). Right Ventricle: The right ventricular size is mildly enlarged. No increase in right ventricular wall thickness. Right ventricular systolic function is normal. Left Atrium: Left atrial size was moderately dilated. Right Atrium: Right atrial size was mild to moderately dilated. Pericardium: There is no evidence of pericardial effusion. Mitral Valve: The mitral valve is normal in structure. No evidence of mitral valve regurgitation. No evidence of mitral valve stenosis. Tricuspid Valve: The tricuspid valve is normal in structure. Tricuspid valve regurgitation is trivial. No evidence of tricuspid stenosis. Aortic Valve: The aortic valve is normal in structure. Aortic valve regurgitation is not visualized. No aortic stenosis is present. Aortic valve mean gradient measures 4.0 mmHg. Aortic valve peak gradient measures 7.4 mmHg. Aortic valve area, by VTI measures 1.66 cm. Pulmonic Valve: The pulmonic valve was normal in structure. Pulmonic valve regurgitation is not visualized. No evidence of pulmonic stenosis. Aorta: The aortic root is normal in size and structure. Venous: The inferior vena cava is normal in size with greater than 50% respiratory variability, suggesting right atrial pressure of 3 mmHg. IAS/Shunts: No atrial level shunt detected by color flow Doppler.  LEFT VENTRICLE PLAX 2D LVIDd:         4.84 cm  Diastology LVIDs:         3.47 cm  LV e' medial:    9.14 cm/s LV PW:         1.23 cm  LV E/e' medial:  11.8 LV IVS:        0.92 cm  LV e' lateral:   10.00 cm/s LVOT diam:     2.00 cm  LV E/e' lateral: 10.8 LV SV:         42 LV SV Index:   21 LVOT Area:     3.14 cm  RIGHT VENTRICLE RV Basal diam:  2.90 cm RV S prime:     8.49 cm/s TAPSE  (M-mode): 3.5 cm LEFT ATRIUM             Index       RIGHT ATRIUM           Index LA diam:        4.30 cm 2.21 cm/m  RA Area:     21.70 cm LA Vol (A2C):   82.7 ml 42.55 ml/m RA Volume:   62.70 ml  32.26 ml/m LA Vol (A4C):   80.5 ml 41.41 ml/m LA Biplane Vol: 82.5 ml 42.44 ml/m  AORTIC VALVE                   PULMONIC VALVE AV Area (Vmax):    1.42 cm    PV Vmax:        0.66 m/s AV Area (Vmean):   1.40 cm    PV Peak grad:   1.7 mmHg AV Area (VTI):     1.66 cm    RVOT Peak grad: 2 mmHg AV Vmax:           136.00 cm/s AV Vmean:          90.200 cm/s AV VTI:            0.251 m AV Peak Grad:      7.4 mmHg  AV Mean Grad:      4.0 mmHg LVOT Vmax:         61.30 cm/s LVOT Vmean:        40.200 cm/s LVOT VTI:          0.133 m LVOT/AV VTI ratio: 0.53  AORTA Ao Root diam: 2.60 cm MITRAL VALVE MV Area (PHT): 5.38 cm     SHUNTS MV Decel Time: 141 msec     Systemic VTI:  0.13 m MV E velocity: 108.00 cm/s  Systemic Diam: 2.00 cm Neoma Laming MD Electronically signed by Neoma Laming MD Signature Date/Time: 06/17/2020/4:05:43 PM    Final    XR C-ARM NO REPORT  Result Date: 06/23/2020 Please see Notes tab for imaging impression.  ECHO TEE  Result Date: 06/29/2020    TRANSESOPHOGEAL ECHO REPORT   Patient Name:   Tammy Tammy [name] Date of Exam: 06/29/2020 Medical Rec #:  [id number]            Height:       60.0 in Accession #:    [id number]           Weight:       230.0 lb Date of Birth:  06/16/1951           BSA:          1.981 m Patient Age:    5 years             BP:           134/77 mmHg Patient Gender: F                    HR:           102 bpm. Exam Location:  [facility] Procedure: Transesophageal Echo, Color Doppler and Cardiac Doppler Indications:     Atrial Fibrillation 427.31  History:         Patient has prior history of Echocardiogram examinations, most                  recent 06/17/2020. Risk Factors:Hypertension and Diabetes.  Sonographer:     Sherrie Sport RDCS (AE) Referring Phys:  [place] Diagnosing  Phys: Neoma Laming MD PROCEDURE: The transesophogeal probe was passed without difficulty through the esophogus of the patient. Sedation performed by different physician. The patient developed no complications during the procedure. IMPRESSIONS  1. Left ventricular ejection fraction, by estimation, is 55 to 60%. The left ventricle has normal function. The left ventricle demonstrates regional wall motion abnormalities (see scoring diagram/findings for description).  2. Right ventricular systolic function is mildly reduced. The right ventricular size is normal. Mildly increased right ventricular wall thickness.  3. Left atrial size was mild to moderately dilated. No left atrial/left atrial appendage thrombus was detected.  4. Right atrial size was mildly dilated.  5. The mitral valve is normal in structure. Trivial mitral valve regurgitation.  6. The aortic valve is normal in structure. Aortic valve regurgitation is trivial. FINDINGS  Left Ventricle: Left ventricular ejection fraction, by estimation, is 55 to 60%. The left ventricle has normal function. The left ventricle demonstrates regional wall motion abnormalities. The left ventricular internal cavity size was normal in size. Right Ventricle: The right ventricular size is normal. Mildly increased right ventricular wall thickness. Right ventricular systolic function is mildly reduced. Left Atrium: Left atrial size was mild to moderately dilated. No left atrial/left atrial appendage thrombus was detected. Right Atrium: Right atrial size was mildly dilated.  Pericardium: The pericardium was not well visualized. Mitral Valve: The mitral valve is normal in structure. Trivial mitral valve regurgitation. Tricuspid Valve: The tricuspid valve is grossly normal. Tricuspid valve regurgitation is trivial. Aortic Valve: The aortic valve is normal in structure. Aortic valve regurgitation is trivial. Pulmonic Valve: The pulmonic valve was normal in structure. Pulmonic valve  regurgitation is not visualized. Aorta: The aortic root was not well visualized. IAS/Shunts: No atrial level shunt detected by color flow Doppler. Neoma Laming MD Electronically signed by Neoma Laming MD Signature Date/Time: 06/29/2020/8:39:03 AM    Final      Procedure - Cardioversion  Subjective: Pt feels well.  No chest pain, palpitations for shortness of breath.   Discharge Exam: Vitals:   06/30/20 1429 06/30/20 1544  BP:  120/71  Pulse:  75  Resp:    Temp:  97.8 F (36.6 C)  SpO2: 97% 96%   Vitals:   06/30/20 0748 06/30/20 1145 06/30/20 1429 06/30/20 1544  BP: 135/63 125/66  120/71  Pulse: 65 72  75  Resp:  18    Temp: 97.6 F (36.4 C) 98.5 F (36.9 C)  97.8 F (36.6 C)  TempSrc: Oral Oral  Oral  SpO2: 98% 98% 97% 96%  Weight:      Height:        General: Pt is alert, awake, not in acute distress Cardiovascular: irregular rhythm, regular rate, S1/S2 +, no rubs, no gallops Respiratory: CTA bilaterally, no wheezing, no rhonchi Abdominal: Soft, NT, ND, bowel sounds + Extremities: no edema, no cyanosis    The results of significant diagnostics from this hospitalization (including imaging, microbiology, ancillary and laboratory) are listed below for reference.     Microbiology: Recent Results (from the past 240 hour(s))  Resp Panel by RT-PCR (Flu A&B, Covid) Nasopharyngeal Swab     Status: None   Collection Time: 06/28/20  5:40 AM   Specimen: Nasopharyngeal Swab; Nasopharyngeal(NP) swabs in vial transport medium  Result Value Ref Range Status   SARS Coronavirus 2 by RT PCR NEGATIVE NEGATIVE Final    Comment: (NOTE) SARS-CoV-2 target nucleic acids are NOT DETECTED.  The SARS-CoV-2 RNA is generally detectable in upper respiratory specimens during the acute phase of infection. The lowest concentration of SARS-CoV-2 viral copies this assay can detect is 138 copies/mL. A negative result does not preclude SARS-Cov-2 infection and should not be used as the sole  basis for treatment or other patient management decisions. A negative result may occur with  improper specimen collection/handling, submission of specimen other than nasopharyngeal swab, presence of viral mutation(s) within the areas targeted by this assay, and inadequate number of viral copies(<138 copies/mL). A negative result must be combined with clinical observations, patient history, and epidemiological information. The expected result is Negative.  Fact Sheet for Patients:  EntrepreneurPulse.com.au  Fact Sheet for Healthcare Providers:  IncredibleEmployment.be  This test is no t yet approved or cleared by the [place] FDA and  has been authorized for detection and/or diagnosis of SARS-CoV-2 by FDA under an Emergency Use Authorization (EUA). This EUA will remain  in effect (meaning this test can be used) for the duration of the COVID-19 declaration under Section 564(b)(1) of the Act, 21 U.S.C.section 360bbb-3(b)(1), unless the authorization is terminated  or revoked sooner.       Influenza A by PCR NEGATIVE NEGATIVE Final   Influenza B by PCR NEGATIVE NEGATIVE Final    Comment: (NOTE) The Xpert Xpress SARS-CoV-2/FLU/RSV plus assay is intended as an aid in  the diagnosis of influenza from Nasopharyngeal swab specimens and should not be used as a sole basis for treatment. Nasal washings and aspirates are unacceptable for Xpert Xpress SARS-CoV-2/FLU/RSV testing.  Fact Sheet for Patients: EntrepreneurPulse.com.au  Fact Sheet for Healthcare Providers: IncredibleEmployment.be  This test is not yet approved or cleared by the [place] FDA and has been authorized for detection and/or diagnosis of SARS-CoV-2 by FDA under an Emergency Use Authorization (EUA). This EUA will remain in effect (meaning this test can be used) for the duration of the COVID-19 declaration under Section 564(b)(1) of the Act,  21 U.S.C. section 360bbb-3(b)(1), unless the authorization is terminated or revoked.  Performed at [facility]my [name], [address] [place], [place] [address]      Labs: BNP (last 3 results) Recent Labs    06/16/20 2154 06/28/20 0539  BNP 263.3* 2,440.1*   Basic Metabolic Panel: Recent Labs  Lab 06/28/20 0539 06/29/20 0427 06/30/20 0602  NA 142 142 141  K 3.8 3.2* 3.0*  CL 106 100 100  CO2 25 31 30   GLUCOSE 132* 122* 120*  BUN 20 20 25*  CREATININE 0.76 0.81 0.81  CALCIUM 9.2 9.1 8.9  MG  --  1.9 1.9   Liver Function Tests: Recent Labs  Lab 06/28/20 0539  AST 35  ALT 44  ALKPHOS 65  BILITOT 1.1  PROT 7.4  ALBUMIN 3.6   No results for input(s): LIPASE, AMYLASE in the last 168 hours. No results for input(s): AMMONIA in the last 168 hours. CBC: Recent Labs  Lab 06/28/20 0539 06/29/20 0427  WBC 13.6* 14.1*  HGB 14.1 14.1  HCT 43.1 43.2  MCV 94.1 92.7  PLT 505* 451*   Cardiac Enzymes: No results for input(s): CKTOTAL, CKMB, CKMBINDEX, TROPONINI in the last 168 hours. BNP: Invalid input(s): POCBNP CBG: Recent Labs  Lab 06/29/20 1208 06/29/20 1723 06/29/20 2119 06/30/20 0753 06/30/20 1149  GLUCAP 135* 131* 129* 116* 92   D-Dimer No results for input(s): DDIMER in the last 72 hours. Hgb A1c No results for input(s): HGBA1C in the last 72 hours. Lipid Profile No results for input(s): CHOL, HDL, LDLCALC, TRIG, CHOLHDL, LDLDIRECT in the last 72 hours. Thyroid function studies No results for input(s): TSH, T4TOTAL, T3FREE, THYROIDAB in the last 72 hours.  Invalid input(s): FREET3 Anemia work up No results for input(s): VITAMINB12, FOLATE, FERRITIN, TIBC, IRON, RETICCTPCT in the last 72 hours. Urinalysis    Component Value Date/Time   COLORURINE YELLOW 03/30/2016 0424   APPEARANCEUR CLEAR 03/30/2016 0424   LABSPEC 1.020 03/30/2016 0424   PHURINE 7.5 03/30/2016 0424   GLUCOSEU NEGATIVE 03/30/2016 0424   HGBUR NEGATIVE 03/30/2016 0424    BILIRUBINUR NEGATIVE 03/30/2016 0424   KETONESUR NEGATIVE 03/30/2016 0424   PROTEINUR >300 (A) 03/30/2016 0424   UROBILINOGEN 0.2 04/26/2008 1526   NITRITE NEGATIVE 03/30/2016 0424   LEUKOCYTESUR NEGATIVE 03/30/2016 0424   Sepsis Labs Invalid input(s): PROCALCITONIN,  WBC,  LACTICIDVEN Microbiology Recent Results (from the past 240 hour(s))  Resp Panel by RT-PCR (Flu A&B, Covid) Nasopharyngeal Swab     Status: None   Collection Time: 06/28/20  5:40 AM   Specimen: Nasopharyngeal Swab; Nasopharyngeal(NP) swabs in vial transport medium  Result Value Ref Range Status   SARS Coronavirus 2 by RT PCR NEGATIVE NEGATIVE Final    Comment: (NOTE) SARS-CoV-2 target nucleic acids are NOT DETECTED.  The SARS-CoV-2 RNA is generally detectable in upper respiratory specimens during the acute phase of infection. The lowest concentration of SARS-CoV-2  viral copies this assay can detect is 138 copies/mL. A negative result does not preclude SARS-Cov-2 infection and should not be used as the sole basis for treatment or other patient management decisions. A negative result may occur with  improper specimen collection/handling, submission of specimen other than nasopharyngeal swab, presence of viral mutation(s) within the areas targeted by this assay, and inadequate number of viral copies(<138 copies/mL). A negative result must be combined with clinical observations, patient history, and epidemiological information. The expected result is Negative.  Fact Sheet for Patients:  EntrepreneurPulse.com.au  Fact Sheet for Healthcare Providers:  IncredibleEmployment.be  This test is no t yet approved or cleared by the [place] FDA and  has been authorized for detection and/or diagnosis of SARS-CoV-2 by FDA under an Emergency Use Authorization (EUA). This EUA will remain  in effect (meaning this test can be used) for the duration of the COVID-19 declaration under  Section 564(b)(1) of the Act, 21 U.S.C.section 360bbb-3(b)(1), unless the authorization is terminated  or revoked sooner.       Influenza A by PCR NEGATIVE NEGATIVE Final   Influenza B by PCR NEGATIVE NEGATIVE Final    Comment: (NOTE) The Xpert Xpress SARS-CoV-2/FLU/RSV plus assay is intended as an aid in the diagnosis of influenza from Nasopharyngeal swab specimens and should not be used as a sole basis for treatment. Nasal washings and aspirates are unacceptable for Xpert Xpress SARS-CoV-2/FLU/RSV testing.  Fact Sheet for Patients: EntrepreneurPulse.com.au  Fact Sheet for Healthcare Providers: IncredibleEmployment.be  This test is not yet approved or cleared by the [place] FDA and has been authorized for detection and/or diagnosis of SARS-CoV-2 by FDA under an Emergency Use Authorization (EUA). This EUA will remain in effect (meaning this test can be used) for the duration of the COVID-19 declaration under Section 564(b)(1) of the Act, 21 U.S.C. section 360bbb-3(b)(1), unless the authorization is terminated or revoked.  Performed at [facility], [address] [place], [place] [address]      Time coordinating discharge: Over 30 minutes  SIGNED:   Ezekiel Slocumb, DO [facility] 06/30/2020, 4:05 PM   If 7PM-7AM, please contact night-coverage [email or web address]

## 2020-06-30 NOTE — Progress Notes (Signed)
SUBJECTIVE: Patient resting comfortably in bed. States her dyspnea has improved. Patient on 4L [place] when assessed and was decreased to 2L [place]. Denies any throat discomfort or chest pain after TEE and DCCV yesterday.   Vitals:   06/29/20 2001 06/29/20 2348 06/30/20 0335 06/30/20 0748  BP: 123/80 113/72 131/71 135/63  Pulse: 75 67 67 65  Resp:      Temp: 98.1 F (36.7 C) 97.9 F (36.6 C) 97.7 F (36.5 C) 97.6 F (36.4 C)  TempSrc: Oral Oral Oral Oral  SpO2: 97% 96% 96% 98%  Weight:   99.2 kg   Height:        Intake/Output Summary (Last 24 hours) at 06/30/2020 0928 Last data filed at 06/30/2020 0700 Gross per 24 hour  Intake 736.17 ml  Output 1500 ml  Net -763.83 ml    LABS: Basic Metabolic Panel: Recent Labs    06/29/20 0427 06/30/20 0602  NA 142 141  K 3.2* 3.0*  CL 100 100  CO2 31 30  GLUCOSE 122* 120*  BUN 20 25*  CREATININE 0.81 0.81  CALCIUM 9.1 8.9  MG 1.9 1.9   Liver Function Tests: Recent Labs    06/28/20 0539  AST 35  ALT 44  ALKPHOS 65  BILITOT 1.1  PROT 7.4  ALBUMIN 3.6   No results for input(s): LIPASE, AMYLASE in the last 72 hours. CBC: Recent Labs    06/28/20 0539 06/29/20 0427  WBC 13.6* 14.1*  HGB 14.1 14.1  HCT 43.1 43.2  MCV 94.1 92.7  PLT 505* 451*   Cardiac Enzymes: No results for input(s): CKTOTAL, CKMB, CKMBINDEX, TROPONINI in the last 72 hours. BNP: Invalid input(s): POCBNP D-Dimer: No results for input(s): DDIMER in the last 72 hours. Hemoglobin A1C: No results for input(s): HGBA1C in the last 72 hours. Fasting Lipid Panel: No results for input(s): CHOL, HDL, LDLCALC, TRIG, CHOLHDL, LDLDIRECT in the last 72 hours. Thyroid Function Tests: No results for input(s): TSH, T4TOTAL, T3FREE, THYROIDAB in the last 72 hours.  Invalid input(s): FREET3 Anemia Panel: No results for input(s): VITAMINB12, FOLATE, FERRITIN, TIBC, IRON, RETICCTPCT in the last 72 hours.   PHYSICAL EXAM General: Well developed, well nourished, in no  acute distress HEENT:  Normocephalic and atramatic Neck:  No JVD.  Lungs: Clear bilaterally to auscultation and percussion. Heart: Irregularly irregular  Abdomen: Bowel sounds are positive, abdomen soft and non-tender  Msk:  Back normal, normal gait. Normal strength and tone for age. Extremities: No clubbing, cyanosis or edema.   Neuro: Alert and oriented X 3. Psych:  Good affect, responds appropriately  TELEMETRY: Atrial fibrillation 86/bpm  ASSESSMENT AND PLAN: Patient presenting to the emergency department with palpitations and dyspnea and found to be in A fib RVR with a HFpEF exacerbation. With this being the patient's second admission for A fib RVR, TEE and DCCV was completed. Patient failed cardioversion x3. Patient further rate controlled today, but is continuing to require diltiazem infusion. Plan to start digoxin 250mcg PO and continue oral sotalol 80 mg twice daily and diltiazem XR 240 mg daily with goal to titrate off the diltiazem infusion today.  Patient already appropriately anticoagulated with Eliquis 5 mg twice daily. Patient had a normal NST as an outpatient recently and therefore continued Atrial fibrillation most likely induced by uncontrolled OSA. Patient has planned sleep study scheduled next week. Regarding HFpEF, patient's dyspnea has vastly improved with diuresis and a 5kg weight loss. Will transition furosemide to oral today. We will continue to follow.  Principal Problem:   Atrial fibrillation with RVR (HCC) Active Problems:   Diabetes mellitus (HCC)   HTN (hypertension)   Sleep apnea   Obesity, Class III, BMI 40-49.9 (morbid obesity) (HCC)   Depression   Respiratory failure, acute (HCC)   Acute diastolic CHF (congestive heart failure) ([place])    Chart review, assessment, and plan time spent 32 minutes.  Adaline Sill, NP-C 06/30/2020 9:28 AM

## 2020-06-30 NOTE — Evaluation (Signed)
Physical Therapy Evaluation Patient Details Name: Tammy [name] MRN: [id number] DOB: [birth year] Today's Date: 06/30/2020   History of Present Illness  Pt is a 69 yo female that presented to the emergency department with palpitations and dyspnea and found to be in A fib RVR with a HFpEF exacerbation. With this being the patient's second admission for A fib RVR, TEE and DCCV was completed. Patient failed cardioversion x3. PMH of DM, fibromyalgia, HTN, HLD.    Clinical Impression  Pt alert, agreeable to PT. Trialed on room air, and pt able to maintain spO2 >90% throughout session, RN notified of pt status. Pt reported that she is independent at baseline for IADLs/ADLs, no falls.  The patient was able to perform bed mobility modI, normal sitting balance noted. Several bouts of mobility performed; ultimately the patient was able to ambulate to the bathroom and utilize normal commode and don underwear with supervision. Also able to stand at the sink and brush her teeth without assist needed. Pt did report that she preferred to utilize the RW for longer bouts of ambulation (~242ft this session), no unsteadiness, LOB or SOB noted. The patient demonstrated and reported near return to baseline level of functioning, no further acute PT needs indicated. PT to sign off. Please reconsult PT if pt status changes or acute needs are identified.   SaO2 on room air at rest = 97% SaO2 on room air while ambulating = 92%  Of note, HR in 70s-80s in normal sinus rhythm throughout session     Follow Up Recommendations No PT follow up    Equipment Recommendations  Rolling walker with 5" wheels    Recommendations for Other Services       Precautions / Restrictions Precautions Precautions: None Restrictions Weight Bearing Restrictions: No      Mobility  Bed Mobility Overal bed mobility: Modified Independent                  Transfers Overall transfer level: Needs assistance Equipment  used: Rolling walker (2 wheeled);None Transfers: Sit to/from Stand Sit to Stand: Supervision         General transfer comment: cued for hand placement for RW use to improve safety  Ambulation/Gait   Gait Distance (Feet): 300 Feet Assistive device: Rolling walker (2 wheeled);None     Gait velocity interpretation: >2.62 ft/sec, indicative of community ambulatory General Gait Details: pt able to ambulate in room without AD, tended to prefer at least unilateral support. Wanted to utilize RW for longer walk "just in case"  Stairs            Wheelchair Mobility    Modified Rankin (Stroke Patients Only)       Balance Overall balance assessment: Modified Independent                                           Pertinent Vitals/Pain Pain Assessment: No/denies pain    Home Living Family/patient expects to be discharged to:: Private residence Living Arrangements: Spouse/significant other Available Help at Discharge: Family;Available 24 hours/day Type of Home: House Home Access: Stairs to enter Entrance Stairs-Rails: Can reach both;Right;Left Entrance Stairs-Number of Steps: 2 Home Layout: Two level Home Equipment: Grab bars - tub/shower;Grab bars - toilet;Shower seat - built in      Prior Function Level of Independence: Independent  Hand Dominance   Dominant Hand: Right    Extremity/Trunk Assessment   Upper Extremity Assessment Upper Extremity Assessment: Overall WFL for tasks assessed    Lower Extremity Assessment Lower Extremity Assessment: Overall WFL for tasks assessed       Communication   Communication: No difficulties  Cognition Arousal/Alertness: Awake/alert Behavior During Therapy: WFL for tasks assessed/performed Overall Cognitive Status: Within Functional Limits for tasks assessed                                        General Comments      Exercises Other Exercises Other Exercises: Pt  able to utilize standard commode with supervision and grab bars, and don underwear modI Other Exercises: pt stood at sink to brush her teeth without support, modI   Assessment/Plan    PT Assessment Patent does not need any further PT services  PT Problem List         PT Treatment Interventions      PT Goals (Current goals can be found in the Care Plan section)  Acute Rehab PT Goals Patient Stated Goal: to go home PT Goal Formulation: With patient Time For Goal Achievement: 07/14/20 Potential to Achieve Goals: Good    Frequency     Barriers to discharge        Co-evaluation               AM-PAC PT "6 Clicks" Mobility  Outcome Measure Help needed turning from your back to your side while in a flat bed without using bedrails?: None Help needed moving from lying on your back to sitting on the side of a flat bed without using bedrails?: None Help needed moving to and from a bed to a chair (including a wheelchair)?: None Help needed standing up from a chair using your arms (e.g., wheelchair or bedside chair)?: None Help needed to walk in hospital room?: None Help needed climbing 3-5 steps with a railing? : None 6 Click Score: 24    End of Session Equipment Utilized During Treatment: Gait belt Activity Tolerance: Patient tolerated treatment well Patient left: in chair;with call bell/phone within reach Nurse Communication: Mobility status (need to plug in chair alarm) PT Visit Diagnosis: Other abnormalities of gait and mobility (R26.89)    Time: 6237-6283 PT Time Calculation (min) (ACUTE ONLY): 34 min   Charges:   PT Evaluation $PT Eval Low Complexity: 1 Low PT Treatments $Therapeutic Exercise: 8-22 mins $Therapeutic Activity: 8-22 mins       Lieutenant Diego PT, DPT 2:38 PM,06/30/20

## 2020-07-05 DIAGNOSIS — I5032 Chronic diastolic (congestive) heart failure: Secondary | ICD-10-CM | POA: Diagnosis not present

## 2020-07-05 DIAGNOSIS — R079 Chest pain, unspecified: Secondary | ICD-10-CM | POA: Diagnosis not present

## 2020-07-05 DIAGNOSIS — E782 Mixed hyperlipidemia: Secondary | ICD-10-CM | POA: Diagnosis not present

## 2020-07-05 DIAGNOSIS — I4891 Unspecified atrial fibrillation: Secondary | ICD-10-CM | POA: Diagnosis not present

## 2020-07-05 DIAGNOSIS — I10 Essential (primary) hypertension: Secondary | ICD-10-CM | POA: Diagnosis not present

## 2020-07-05 DIAGNOSIS — G4730 Sleep apnea, unspecified: Secondary | ICD-10-CM | POA: Diagnosis not present

## 2020-07-06 DIAGNOSIS — F32A Depression, unspecified: Secondary | ICD-10-CM | POA: Diagnosis not present

## 2020-07-06 DIAGNOSIS — I480 Paroxysmal atrial fibrillation: Secondary | ICD-10-CM | POA: Diagnosis not present

## 2020-07-06 DIAGNOSIS — I10 Essential (primary) hypertension: Secondary | ICD-10-CM | POA: Diagnosis not present

## 2020-07-06 DIAGNOSIS — I4891 Unspecified atrial fibrillation: Secondary | ICD-10-CM | POA: Diagnosis not present

## 2020-07-06 DIAGNOSIS — D72829 Elevated white blood cell count, unspecified: Secondary | ICD-10-CM | POA: Diagnosis not present

## 2020-07-06 DIAGNOSIS — E1129 Type 2 diabetes mellitus with other diabetic kidney complication: Secondary | ICD-10-CM | POA: Diagnosis not present

## 2020-07-06 DIAGNOSIS — D6869 Other thrombophilia: Secondary | ICD-10-CM | POA: Diagnosis not present

## 2020-07-07 NOTE — Progress Notes (Signed)
Patient ID: Tammy [name], female    DOB: 05-18-1951, 69 y.o.   MRN: [id number]  HPI  Tammy [name] is a 69 y/o female with a history of atrial fibrillation, CAD, DM, hyperlipidemia, HTN, obstructive sleep apnea, fibromyalgia, previous tobacco use and chronic heart failure.   Echo report from 06/29/20 reviewed and showed an EF of 55-60% along with mild/moderate LAE and trivial MR.   Admitted 06/28/20 due to shortness of breath due to AF with RVR and acute HF. Cardiology consult obtained. Placed on cardizem drip and able to switch to oral medications. Initially given IV lasix with transition to oral diuretics. Discharged after 2 days. Admitted 06/16/20 due to new onset AF with RVR. Cardiology consult obtained. Cardizem drip begun and then switched to oral cardizem. Discharged after 3 days.   She presents today for her initial visit with a chief complaint of moderate fatigue with little exertion. She describes this as chronic in nature having been present for several weeks. She has associated palpitations and easy bruising along with this. She denies any difficulty sleeping, dizziness, abdominal distention, pedal edema, chest pain, shortness of breath, cough or weight gain.   Past Medical History:  Diagnosis Date  . Agatston coronary artery calcium score greater than 400   . Arrhythmia    atrial fibrillation  . CHF (congestive heart failure) ([place])   . Diabetes mellitus ([place])    x 3 years  . DJD (degenerative joint disease)   . Encephalitis   . Fibromyalgia   . HTN (hypertension)   . Hyperlipidemia   . Sleep apnea    No CPAP   Past Surgical History:  Procedure Laterality Date  . APPENDECTOMY    . BREAST CYST EXCISION    . BREAST EXCISIONAL BIOPSY Left 2004  . KNEE ARTHROSCOPY    . TEE WITHOUT CARDIOVERSION N/A 06/29/2020   Procedure: TRANSESOPHAGEAL ECHOCARDIOGRAM (TEE) with DCCV;  Surgeon: Dionisio David, MD;  Location: [facility] ORS;  Service: Cardiovascular;  Laterality: N/A;    Family History  Problem Relation Age of Onset  . CAD Mother 35  . CAD Brother 47  . Breast cancer Cousin    Social History   Tobacco Use  . Smoking status: Former Smoker    Packs/day: 1.00    Years: 30.00    Pack years: 30.00    Types: Cigarettes    Quit date: 11/01/2008    Years since quitting: 11.7  . Smokeless tobacco: Never Used  Substance Use Topics  . Alcohol use: No    Alcohol/week: 0.0 standard drinks   Allergies  Allergen Reactions  . Betadine [Povidone Iodine] Anaphylaxis  . Contrast Media [Iodinated Diagnostic Agents] Anaphylaxis  . Iodine Anaphylaxis  . Metrizamide Anaphylaxis  . Povidone-Iodine Anaphylaxis  . Shellfish Allergy Anaphylaxis  . Hydrocodone-Acetaminophen Nausea Only   Prior to Admission medications   Medication Sig Start Date End Date Taking? Authorizing Provider  apixaban (ELIQUIS) 5 MG TABS tablet Take 1 tablet (5 mg total) by mouth 2 (two) times daily. 06/19/20  Yes Wieting, Richard, MD  beta carotene w/minerals (OCUVITE) tablet Take 1 tablet by mouth daily.   Yes [provider]  digoxin (LANOXIN) 0.25 MG tablet Take 1 tablet (0.25 mg total) by mouth daily. 07/01/20  Yes Nicole Kindred A, DO  diltiazem (CARDIZEM CD) 240 MG 24 hr capsule Take 1 capsule (240 mg total) by mouth daily. 06/20/20  Yes Wieting, Delfino Lovett, MD  Docusate Sodium (DSS) 100 MG CAPS Take 100 mg by mouth  once a week.   Yes [provider]  DULoxetine (CYMBALTA) 60 MG capsule Take 60 mg by mouth daily.   Yes [provider]  EPINEPHrine 0.3 mg/0.3 mL IJ SOAJ injection Inject 0.3 mg into the muscle as needed for anaphylaxis.   Yes [provider]  Evolocumab (REPATHA SURECLICK) 140 MG/ML SOAJ Inject 1 Dose into the skin every 14 (fourteen) days.   Yes [provider]  Exenatide (BYDUREON [place]) Inject 1 Dose into the skin once a week.   Yes [provider]  fesoterodine (TOVIAZ) 4 MG TB24 tablet Take 4 mg by mouth daily.   Yes  [provider]  fluorouracil (EFUDEX) 5 % cream Apply 1 application topically. 05/27/20  Yes [provider]  furosemide (LASIX) 20 MG tablet Take 1 tablet (20 mg total) by mouth 2 (two) times daily. 06/30/20  Yes Esaw Grandchild A, DO  metFORMIN (GLUCOPHAGE) 1000 MG tablet Take 1,000 mg by mouth daily with breakfast.    Yes [provider]  potassium chloride SA (KLOR-CON) 20 MEQ tablet Take 2 tablets (40 mEq total) by mouth daily. 06/30/20  Yes Pennie Banter, DO  sotalol (BETAPACE) 80 MG tablet Take 1 tablet (80 mg total) by mouth every 12 (twelve) hours. 06/19/20  Yes Wieting, Richard, MD  valACYclovir (VALTREX) 500 MG tablet Take 500 mg by mouth daily.    Yes [provider]  pravastatin (PRAVACHOL) 40 MG tablet Take 1 tablet (40 mg total) by mouth every evening. 01/29/20 06/17/20  Chrystie Nose, MD    Review of Systems  Constitutional: Positive for fatigue (tire easily). Negative for appetite change.  HENT: Negative for congestion, postnasal drip and sore throat.   Eyes: Negative.   Respiratory: Negative for cough, chest tightness and shortness of breath.   Cardiovascular: Positive for palpitations (at times). Negative for chest pain and leg swelling.  Gastrointestinal: Negative for abdominal distention and abdominal pain.  Endocrine: Negative.   Genitourinary: Negative.   Musculoskeletal: Negative for back pain and neck pain.  Skin: Negative.   Allergic/Immunologic: Negative.   Neurological: Negative for dizziness and light-headedness.  Hematological: Negative for adenopathy. Bruises/bleeds easily.  Psychiatric/Behavioral: Negative for dysphoric mood and sleep disturbance (sleeping on 1 pillow). The patient is not nervous/anxious.    Vitals:   07/12/20 0903  BP: 126/71  Pulse: 75  Resp: 20  SpO2: 98%  Weight: 215 lb 6 oz (97.7 kg)  Height: 5' (1.524 m)   Wt Readings from Last 3 Encounters:  07/12/20 215 lb 6 oz (97.7 kg)  06/30/20  218 lb 9.6 oz (99.2 kg)  06/19/20 221 lb 1.6 oz (100.3 kg)   Lab Results  Component Value Date   CREATININE 0.81 06/30/2020   CREATININE 0.81 06/29/2020   CREATININE 0.76 06/28/2020    Physical Exam Vitals and nursing note reviewed. Exam conducted with a chaperone present (husband present).  Constitutional:      Appearance: Normal appearance.  HENT:     Head: Normocephalic and atraumatic.  Cardiovascular:     Rate and Rhythm: Normal rate. Rhythm irregular.  Pulmonary:     Effort: Pulmonary effort is normal. No respiratory distress.     Breath sounds: No wheezing or rales.  Abdominal:     General: Abdomen is flat.     Palpations: Abdomen is soft.     Tenderness: There is no abdominal tenderness.  Musculoskeletal:        General: No tenderness.     Cervical back: Normal  range of motion and neck supple.     Right lower leg: No edema.     Left lower leg: No edema.     Comments: Numerous superficial varicose veins on bilateral lower legs  Skin:    General: Skin is warm and dry.  Neurological:     General: No focal deficit present.     Mental Status: She is alert and oriented to person, place, and time.  Psychiatric:        Mood and Affect: Mood normal.        Behavior: Behavior normal.        Thought Content: Thought content normal.     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 (LAE)- - NYHA class III - euvolemic today - weighing daily; reminded to call for an overnight weight gain of > 2 pounds or a weekly weight gain of > 5 pounds - not adding salt to her food and has been reading food labels; discussed keeping sodium content to ~ 2000mg  / day - sees cardiology Humphrey Rolls) tomorrow - could consider adding entresto at future visits - BNP 06/28/20 was 1131.8 - has not gotten her flu vaccine for this season - has gotten 2 covid vaccines  2: HTN- - BP looks good today - saw PCP Brigitte Pulse) 03/31/20 - BMP 06/30/20  reviewed and showed sodium 141, potassium 3.0, creatinine 0.81 & GFR >60  3: DM- - A1c 06/17/20 was 5.6% - fasting glucose in clinic today was 114  4: atrial fibrillation- - rate controlled on sotalol and digoxin - taking apixaban  5: OSA- - has sleep study scheduled January 2022   Medication bottles reviewed.   Return in 2 months or sooner for any questions/problems before then.

## 2020-07-12 ENCOUNTER — Encounter: Payer: Self-pay | Admitting: Family

## 2020-07-12 ENCOUNTER — Other Ambulatory Visit: Payer: Self-pay

## 2020-07-12 ENCOUNTER — Ambulatory Visit: Payer: Medicare Other | Attending: Family | Admitting: Family

## 2020-07-12 VITALS — BP 126/71 | HR 75 | Resp 20 | Ht 60.0 in | Wt 215.4 lb

## 2020-07-12 DIAGNOSIS — I2510 Atherosclerotic heart disease of native coronary artery without angina pectoris: Secondary | ICD-10-CM | POA: Insufficient documentation

## 2020-07-12 DIAGNOSIS — Z87891 Personal history of nicotine dependence: Secondary | ICD-10-CM | POA: Insufficient documentation

## 2020-07-12 DIAGNOSIS — I4891 Unspecified atrial fibrillation: Secondary | ICD-10-CM | POA: Insufficient documentation

## 2020-07-12 DIAGNOSIS — I110 Hypertensive heart disease with heart failure: Secondary | ICD-10-CM | POA: Insufficient documentation

## 2020-07-12 DIAGNOSIS — G4733 Obstructive sleep apnea (adult) (pediatric): Secondary | ICD-10-CM | POA: Diagnosis not present

## 2020-07-12 DIAGNOSIS — E119 Type 2 diabetes mellitus without complications: Secondary | ICD-10-CM | POA: Diagnosis not present

## 2020-07-12 DIAGNOSIS — Z7901 Long term (current) use of anticoagulants: Secondary | ICD-10-CM | POA: Diagnosis not present

## 2020-07-12 DIAGNOSIS — I509 Heart failure, unspecified: Secondary | ICD-10-CM | POA: Diagnosis not present

## 2020-07-12 DIAGNOSIS — Z7984 Long term (current) use of oral hypoglycemic drugs: Secondary | ICD-10-CM | POA: Diagnosis not present

## 2020-07-12 DIAGNOSIS — I10 Essential (primary) hypertension: Secondary | ICD-10-CM

## 2020-07-12 DIAGNOSIS — Z79899 Other long term (current) drug therapy: Secondary | ICD-10-CM | POA: Insufficient documentation

## 2020-07-12 DIAGNOSIS — I5032 Chronic diastolic (congestive) heart failure: Secondary | ICD-10-CM

## 2020-07-12 DIAGNOSIS — E785 Hyperlipidemia, unspecified: Secondary | ICD-10-CM | POA: Insufficient documentation

## 2020-07-12 DIAGNOSIS — G4730 Sleep apnea, unspecified: Secondary | ICD-10-CM

## 2020-07-12 LAB — GLUCOSE, CAPILLARY: Glucose-Capillary: 114 mg/dL — ABNORMAL HIGH (ref 70–99)

## 2020-07-12 NOTE — Patient Instructions (Signed)
Continue weighing daily and call for an overnight weight gain of > 2 pounds or a weekly weight gain of >5 pounds. 

## 2020-07-13 DIAGNOSIS — G4730 Sleep apnea, unspecified: Secondary | ICD-10-CM | POA: Diagnosis not present

## 2020-07-13 DIAGNOSIS — I4891 Unspecified atrial fibrillation: Secondary | ICD-10-CM | POA: Diagnosis not present

## 2020-07-13 DIAGNOSIS — I10 Essential (primary) hypertension: Secondary | ICD-10-CM | POA: Diagnosis not present

## 2020-07-13 DIAGNOSIS — E782 Mixed hyperlipidemia: Secondary | ICD-10-CM | POA: Diagnosis not present

## 2020-07-14 ENCOUNTER — Encounter: Payer: Self-pay | Admitting: Orthopaedic Surgery

## 2020-07-14 ENCOUNTER — Ambulatory Visit (INDEPENDENT_AMBULATORY_CARE_PROVIDER_SITE_OTHER): Payer: Medicare Other | Admitting: Orthopaedic Surgery

## 2020-07-14 DIAGNOSIS — G8929 Other chronic pain: Secondary | ICD-10-CM

## 2020-07-14 DIAGNOSIS — M5442 Lumbago with sciatica, left side: Secondary | ICD-10-CM

## 2020-07-14 DIAGNOSIS — M4807 Spinal stenosis, lumbosacral region: Secondary | ICD-10-CM

## 2020-07-14 NOTE — Progress Notes (Signed)
The patient is following up after having an epidural steroid injection by Dr. Alvester Morin to treat sciatic changes there is going down her left leg.  She said the injections helped her quite a bit.  Therapy also helped.  Since the injection she does let me know that she has been hospitalized with atrial fibrillation and congestive heart failure.  She is now on Eliquis.  She reports now she is back in a regular rhythm and feels better overall.  She had had a history of A. fib in the past.  She has negative straight leg raise bilaterally.  She gets up out of a chair easily and mobilizes well.  Her bilateral hip exam is also normal and her bilateral knee exam shows some valgus malalignment due to her obesity but no significant issues otherwise.  Since she is doing so well follow-up can be as needed.  If things worsen anyway she will let us know.

## 2020-07-17 HISTORY — PX: BACK SURGERY: SHX140

## 2020-07-26 DIAGNOSIS — G4733 Obstructive sleep apnea (adult) (pediatric): Secondary | ICD-10-CM | POA: Diagnosis not present

## 2020-07-27 DIAGNOSIS — N181 Chronic kidney disease, stage 1: Secondary | ICD-10-CM | POA: Diagnosis not present

## 2020-07-27 DIAGNOSIS — R809 Proteinuria, unspecified: Secondary | ICD-10-CM | POA: Diagnosis not present

## 2020-07-27 DIAGNOSIS — E1122 Type 2 diabetes mellitus with diabetic chronic kidney disease: Secondary | ICD-10-CM | POA: Diagnosis not present

## 2020-07-27 DIAGNOSIS — E876 Hypokalemia: Secondary | ICD-10-CM | POA: Diagnosis not present

## 2020-07-27 DIAGNOSIS — I129 Hypertensive chronic kidney disease with stage 1 through stage 4 chronic kidney disease, or unspecified chronic kidney disease: Secondary | ICD-10-CM | POA: Diagnosis not present

## 2020-08-05 DIAGNOSIS — I5032 Chronic diastolic (congestive) heart failure: Secondary | ICD-10-CM | POA: Diagnosis not present

## 2020-08-05 DIAGNOSIS — M1712 Unilateral primary osteoarthritis, left knee: Secondary | ICD-10-CM | POA: Diagnosis not present

## 2020-08-05 DIAGNOSIS — R609 Edema, unspecified: Secondary | ICD-10-CM | POA: Diagnosis not present

## 2020-08-05 DIAGNOSIS — M25562 Pain in left knee: Secondary | ICD-10-CM | POA: Diagnosis not present

## 2020-08-05 DIAGNOSIS — M25561 Pain in right knee: Secondary | ICD-10-CM | POA: Diagnosis not present

## 2020-08-05 DIAGNOSIS — M170 Bilateral primary osteoarthritis of knee: Secondary | ICD-10-CM | POA: Diagnosis not present

## 2020-08-05 DIAGNOSIS — E782 Mixed hyperlipidemia: Secondary | ICD-10-CM | POA: Diagnosis not present

## 2020-08-05 DIAGNOSIS — I4891 Unspecified atrial fibrillation: Secondary | ICD-10-CM | POA: Diagnosis not present

## 2020-08-05 DIAGNOSIS — E669 Obesity, unspecified: Secondary | ICD-10-CM | POA: Diagnosis not present

## 2020-08-05 DIAGNOSIS — G4730 Sleep apnea, unspecified: Secondary | ICD-10-CM | POA: Diagnosis not present

## 2020-08-05 DIAGNOSIS — I10 Essential (primary) hypertension: Secondary | ICD-10-CM | POA: Diagnosis not present

## 2020-08-10 DIAGNOSIS — G4733 Obstructive sleep apnea (adult) (pediatric): Secondary | ICD-10-CM | POA: Diagnosis not present

## 2020-08-11 DIAGNOSIS — M25561 Pain in right knee: Secondary | ICD-10-CM | POA: Diagnosis not present

## 2020-08-11 DIAGNOSIS — M1711 Unilateral primary osteoarthritis, right knee: Secondary | ICD-10-CM | POA: Diagnosis not present

## 2020-08-18 DIAGNOSIS — M1712 Unilateral primary osteoarthritis, left knee: Secondary | ICD-10-CM | POA: Diagnosis not present

## 2020-08-18 DIAGNOSIS — M25562 Pain in left knee: Secondary | ICD-10-CM | POA: Diagnosis not present

## 2020-08-19 DIAGNOSIS — M25561 Pain in right knee: Secondary | ICD-10-CM | POA: Diagnosis not present

## 2020-08-19 DIAGNOSIS — M1711 Unilateral primary osteoarthritis, right knee: Secondary | ICD-10-CM | POA: Diagnosis not present

## 2020-08-24 NOTE — Progress Notes (Signed)
Tammy [name] - 70 y.o. female MRN [id number]  Date of birth: [birth year]  Office Visit Note: Visit Date: 06/23/2020 PCP: [name], MD Referred by: [name], MD  Subjective: Chief Complaint  Patient presents with  . Left Leg - Pain  . Lower Back - Pain   HPI:  Tammy [name] is a 70 y.o. female who comes in today at the request of Dr. Jean Rosenthal for planned Left L4-L5 Lumbar epidural steroid injection with fluoroscopic guidance.  The patient has failed conservative care including home exercise, medications, time and activity modification.  This injection will be diagnostic and hopefully therapeutic.  Please see requesting physician notes for further details and justification.  MRI reviewed with images and spine model.  MRI reviewed in the note below.  Patient's case complicated by severe allergy to contrast and iodine.  We will use small amount of Omniscan.  ROS Otherwise per HPI.  Assessment & Plan: Visit Diagnoses:    ICD-10-CM   1. Lumbar radiculopathy  M54.16 XR C-ARM NO REPORT    Epidural Steroid injection    dexamethasone (DECADRON) injection 15 mg    Plan: No additional findings.   Meds & Orders:  Meds ordered this encounter  Medications  . dexamethasone (DECADRON) injection 15 mg    Orders Placed This Encounter  Procedures  . XR C-ARM NO REPORT  . Epidural Steroid injection    Follow-up: Return for visit to requesting physician as needed.   Procedures: No procedures performed  Lumbosacral Transforaminal Epidural Steroid Injection - Sub-Pedicular Approach with Fluoroscopic Guidance  Patient: Tammy [name]      Date of Birth: 06/30/1951 MRN: [id number] PCP: [name], MD      Visit Date: 06/23/2020   Universal Protocol:    Date/Time: 06/23/2020  Consent Given By: the patient  Position: PRONE  Additional Comments: Vital signs were monitored before and after the procedure. Patient was prepped and draped in  the usual sterile fashion. The correct patient, procedure, and site was verified.   Injection Procedure Details:   Procedure diagnoses: Lumbar radiculopathy [M54.16]    Meds Administered:  Meds ordered this encounter  Medications  . dexamethasone (DECADRON) injection 15 mg    Laterality: Left  Location/Site:  L4-L5  Needle:5.0 in., 22 ga.  Short bevel or Quincke spinal needle  Needle Placement: Transforaminal  Findings:    -Comments: Excellent flow of contrast along the nerve, nerve root and into the epidural space.  Procedure Details: After squaring off the end-plates to get a true AP view, the C-arm was positioned so that an oblique view of the foramen as noted above was visualized. The target area is just inferior to the "nose of the scotty dog" or sub pedicular. The soft tissues overlying this structure were infiltrated with 2-3 ml. of 1% Lidocaine without Epinephrine.  The spinal needle was inserted toward the target using a "trajectory" view along the fluoroscope beam.  Under AP and lateral visualization, the needle was advanced so it did not puncture dura and was located close the 6 O'Clock position of the pedical in AP tracterory. Biplanar projections were used to confirm position. Aspiration was confirmed to be negative for CSF and/or blood. A 1-2 ml. volume of Omniscan (patient allergy to iodine and contrast) was injected and flow of contrast was noted at each level. Radiographs were obtained for documentation purposes.   After attaining the desired flow of contrast documented above, a 0.5 to 1.0 ml test dose of 0.25% Marcaine  was injected into each respective transforaminal space.  The patient was observed for 90 seconds post injection.  After no sensory deficits were reported, and normal lower extremity motor function was noted,   the above injectate was administered so that equal amounts of the injectate were placed at each foramen (level) into the transforaminal epidural  space.   Additional Comments:  The patient tolerated the procedure well Dressing: 2 x 2 sterile gauze and Band-Aid    Post-procedure details: Patient was observed during the procedure. Post-procedure instructions were reviewed.  Patient left the clinic in stable condition.      Clinical History: MRI LUMBAR SPINE WITHOUT CONTRAST  TECHNIQUE: Multiplanar, multisequence MR imaging of the lumbar spine was performed. No intravenous contrast was administered.  COMPARISON:  Radiographs 02/02/2020  FINDINGS: Segmentation: The inferior-most fully formed vertebral body is labeled L5-S1.  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 Levocurvature. Otherwise, no substantial subluxation.  Vertebrae: Degenerative/discogenic changes about the right L3-L4 disc. Otherwise, no focal marrow signal abnormality to suggest acute fracture, discitis/osteomyelitis, or suspicious bone lesion vertebral body heights are maintained.  Conus medullaris and cauda equina: Conus extends to the L1-L2 level. Conus appears normal.  Paraspinal and other soft tissues: Unremarkable.  Disc levels:  T10-T11 and T11-T12 are only imaged on sagittal sequences without evidence of significant canal or foraminal stenosis.  T12-L1: There is a broad-based disc bulge with superimposed left subarticular disc protrusion. Mild canal and left subarticular recess stenosis. No significant foraminal stenosis.  L1-L2: Broad-based disc bulge with superimposed left paracentral disc protrusion. Mild canal and left subarticular recess stenosis without significant foraminal stenosis.  L2-L3: Broad-based disc bulge with moderate bilateral facet hypertrophy and ligamentum flavum thickening. Small superimposed central disc protrusion. There is resulting mild canal stenosis and mild bilateral foraminal stenosis.  L3-L4: Right eccentric broad-based disc bulge with severe right and moderate left facet  hypertrophy. There is resulting moderate canal stenosis and right greater than left subarticular recess stenosis with possible impingement on the right. Moderate to severe right and moderate left foraminal stenosis.  L4-L5: Grade 1 anterolisthesis of L4 on L5. Broad-based disc bulge and severe bilateral facet hypertrophy. There is resulting moderate to severe canal stenosis and bilateral subarticular recess narrowing. There is a left foraminal/extraforaminal disc herniation (see series 3, image 13) which appears to contact the exiting left L4 nerve root and results in moderate left foraminal stenosis. Mild right foraminal stenosis.  L5-S1: Small central disc protrusion. Severe bilateral facet hypertrophy. Mild bilateral foraminal stenosis. No significant canal stenosis.  IMPRESSION: 1. Multilevel canal stenosis, moderate to severe at L4-L5, moderate at L3-L4, and mild at L2-L3. 2. Right eccentric degenerative disc disease at L3-L4 with moderate to severe right and moderate left foraminal stenosis. There is right greater than left subarticular recess stenosis with possible impingement. 3. At L4-L5 there is a left foraminal/extraforaminal disc herniation which appears to contact the exiting left L4 nerve root with moderate left foraminal stenosis. 4. At T12-L1 and L1-L2 there are left subarticular disc protrusions which appear to contact descending nerve roots without evidence of impingement.   Electronically Signed   By: Margaretha Sheffield MD   On: 04/30/2020 08:20     Objective:  VS:  HT:    WT:   BMI:     BP:(!) 136/91  HR:(!) 118bpm  TEMP: ( )  RESP:  Physical Exam Vitals and nursing note reviewed.  Constitutional:      General: She is not in acute distress.  Appearance: Normal appearance. She is obese. She is not ill-appearing.  HENT:     Head: Normocephalic and atraumatic.     Right Ear: External ear normal.     Left Ear: External ear normal.  Eyes:      Extraocular Movements: Extraocular movements intact.  Cardiovascular:     Rate and Rhythm: Normal rate.     Pulses: Normal pulses.  Pulmonary:     Effort: Pulmonary effort is normal. No respiratory distress.  Abdominal:     General: There is no distension.     Palpations: Abdomen is soft.  Musculoskeletal:        General: Tenderness present.     Cervical back: Neck supple.     Right lower leg: No edema.     Left lower leg: No edema.     Comments: Patient has good distal strength with no pain over the greater trochanters.  No clonus or focal weakness.  Skin:    Findings: No erythema, lesion or rash.  Neurological:     General: No focal deficit present.     Mental Status: She is alert and oriented to person, place, and time.     Sensory: No sensory deficit.     Motor: No weakness or abnormal muscle tone.     Coordination: Coordination normal.  Psychiatric:        Mood and Affect: Mood normal.        Behavior: Behavior normal.      Imaging: No results found.

## 2020-08-25 DIAGNOSIS — M25562 Pain in left knee: Secondary | ICD-10-CM | POA: Diagnosis not present

## 2020-08-25 DIAGNOSIS — M1712 Unilateral primary osteoarthritis, left knee: Secondary | ICD-10-CM | POA: Diagnosis not present

## 2020-08-26 DIAGNOSIS — M25561 Pain in right knee: Secondary | ICD-10-CM | POA: Diagnosis not present

## 2020-08-26 DIAGNOSIS — M1711 Unilateral primary osteoarthritis, right knee: Secondary | ICD-10-CM | POA: Diagnosis not present

## 2020-09-09 ENCOUNTER — Other Ambulatory Visit: Payer: Self-pay

## 2020-09-09 ENCOUNTER — Ambulatory Visit: Payer: Medicare Other | Attending: Family | Admitting: Family

## 2020-09-09 ENCOUNTER — Encounter: Payer: Self-pay | Admitting: Family

## 2020-09-09 VITALS — BP 138/87 | HR 88 | Resp 18 | Ht 60.0 in | Wt 227.4 lb

## 2020-09-09 DIAGNOSIS — I4891 Unspecified atrial fibrillation: Secondary | ICD-10-CM | POA: Insufficient documentation

## 2020-09-09 DIAGNOSIS — G4733 Obstructive sleep apnea (adult) (pediatric): Secondary | ICD-10-CM | POA: Diagnosis not present

## 2020-09-09 DIAGNOSIS — R002 Palpitations: Secondary | ICD-10-CM | POA: Insufficient documentation

## 2020-09-09 DIAGNOSIS — I5032 Chronic diastolic (congestive) heart failure: Secondary | ICD-10-CM | POA: Diagnosis not present

## 2020-09-09 DIAGNOSIS — E119 Type 2 diabetes mellitus without complications: Secondary | ICD-10-CM | POA: Insufficient documentation

## 2020-09-09 DIAGNOSIS — Z7984 Long term (current) use of oral hypoglycemic drugs: Secondary | ICD-10-CM | POA: Diagnosis not present

## 2020-09-09 DIAGNOSIS — I110 Hypertensive heart disease with heart failure: Secondary | ICD-10-CM | POA: Insufficient documentation

## 2020-09-09 DIAGNOSIS — Z87891 Personal history of nicotine dependence: Secondary | ICD-10-CM | POA: Diagnosis not present

## 2020-09-09 DIAGNOSIS — Z888 Allergy status to other drugs, medicaments and biological substances status: Secondary | ICD-10-CM | POA: Insufficient documentation

## 2020-09-09 DIAGNOSIS — Z7901 Long term (current) use of anticoagulants: Secondary | ICD-10-CM | POA: Diagnosis not present

## 2020-09-09 DIAGNOSIS — Z79899 Other long term (current) drug therapy: Secondary | ICD-10-CM | POA: Insufficient documentation

## 2020-09-09 DIAGNOSIS — G4730 Sleep apnea, unspecified: Secondary | ICD-10-CM

## 2020-09-09 DIAGNOSIS — I2510 Atherosclerotic heart disease of native coronary artery without angina pectoris: Secondary | ICD-10-CM | POA: Insufficient documentation

## 2020-09-09 DIAGNOSIS — Z885 Allergy status to narcotic agent status: Secondary | ICD-10-CM | POA: Insufficient documentation

## 2020-09-09 DIAGNOSIS — I10 Essential (primary) hypertension: Secondary | ICD-10-CM

## 2020-09-09 DIAGNOSIS — Z8249 Family history of ischemic heart disease and other diseases of the circulatory system: Secondary | ICD-10-CM | POA: Diagnosis not present

## 2020-09-09 NOTE — Patient Instructions (Addendum)
Continue weighing daily and call for an overnight weight gain of > 2 pounds or a weekly weight gain of >5 pounds.   Call us in the future if you'd like to schedule another appointment 

## 2020-09-09 NOTE — Progress Notes (Signed)
Patient ID: Tammy [name], female    DOB: May 31, 1951, 70 y.o.   MRN: [id number]  HPI  Tammy [name] is a 70 y/o female with a history of atrial fibrillation, CAD, DM, hyperlipidemia, HTN, obstructive sleep apnea, fibromyalgia, previous tobacco use and chronic heart failure.   Echo report from 06/29/20 reviewed and showed an EF of 55-60% along with mild/moderate LAE and trivial MR.   Admitted 06/28/20 due to shortness of breath due to AF with RVR and acute HF. Cardiology consult obtained. Placed on cardizem drip and able to switch to oral medications. Initially given IV lasix with transition to oral diuretics. Discharged after 2 days. Admitted 06/16/20 due to new onset AF with RVR. Cardiology consult obtained. Cardizem drip begun and then switched to oral cardizem. Discharged after 3 days.   She presents today for a follow-up visit with a chief complaint of moderate fatigue upon minimal exertion. She describes this as chronic in nature having been present for several years. She has associated palpitations and weight gain along with this. She denies any difficulty sleeping, abdominal distention, pedal edema, chest pain, dizziness, shortness of breath or cough.   She's had a sleep study and is waiting on delivery of CPAP equipment. She says that she was told that it could be June before she receives it.   Past Medical History:  Diagnosis Date  . Agatston coronary artery calcium score greater than 400   . Arrhythmia    atrial fibrillation  . CHF (congestive heart failure) ([place])   . Diabetes mellitus ([place])    x 3 years  . DJD (degenerative joint disease)   . Encephalitis   . Fibromyalgia   . HTN (hypertension)   . Hyperlipidemia   . Sleep apnea    No CPAP   Past Surgical History:  Procedure Laterality Date  . APPENDECTOMY    . BREAST CYST EXCISION    . BREAST EXCISIONAL BIOPSY Left 2004  . KNEE ARTHROSCOPY    . TEE WITHOUT CARDIOVERSION N/A 06/29/2020   Procedure: TRANSESOPHAGEAL  ECHOCARDIOGRAM (TEE) with DCCV;  Surgeon: Dionisio David, MD;  Location: [facility] ORS;  Service: Cardiovascular;  Laterality: N/A;   Family History  Problem Relation Age of Onset  . CAD Mother 107  . CAD Brother 36  . Breast cancer Cousin    Social History   Tobacco Use  . Smoking status: Former Smoker    Packs/day: 1.00    Years: 30.00    Pack years: 30.00    Types: Cigarettes    Quit date: 11/01/2008    Years since quitting: 11.8  . Smokeless tobacco: Never Used  Substance Use Topics  . Alcohol use: No    Alcohol/week: 0.0 standard drinks   Allergies  Allergen Reactions  . Betadine [Povidone Iodine] Anaphylaxis  . Contrast Media [Iodinated Diagnostic Agents] Anaphylaxis  . Iodine Anaphylaxis  . Metrizamide Anaphylaxis  . Povidone-Iodine Anaphylaxis  . Shellfish Allergy Anaphylaxis  . Hydrocodone-Acetaminophen Nausea Only   Prior to Admission medications   Medication Sig Start Date End Date Taking? Authorizing Provider  apixaban (ELIQUIS) 5 MG TABS tablet Take 1 tablet (5 mg total) by mouth 2 (two) times daily. 06/19/20  Yes Wieting, Richard, MD  beta carotene w/minerals (OCUVITE) tablet Take 1 tablet by mouth daily.   Yes [provider]  diltiazem (CARDIZEM CD) 240 MG 24 hr capsule Take 1 capsule (240 mg total) by mouth daily. 06/20/20  Yes Loletha Grayer, MD  Docusate Sodium (DSS) 100 MG  CAPS Take 100 mg by mouth once a week.   Yes [provider]  DULoxetine (CYMBALTA) 60 MG capsule Take 60 mg by mouth daily.   Yes [provider]  EPINEPHrine 0.3 mg/0.3 mL IJ SOAJ injection Inject 0.3 mg into the muscle as needed for anaphylaxis.   Yes [provider]  Evolocumab (REPATHA SURECLICK) 413 MG/ML SOAJ Inject 1 Dose into the skin every 14 (fourteen) days.   Yes [provider]  Exenatide (BYDUREON [place]) Inject 1 Dose into the skin once a week.   Yes [provider]  fesoterodine (TOVIAZ) 4 MG TB24 tablet Take 4 mg by mouth  daily.   Yes [provider]  fluorouracil (EFUDEX) 5 % cream Apply 1 application topically. 05/27/20  Yes [provider]  furosemide (LASIX) 20 MG tablet Take 1 tablet (20 mg total) by mouth 2 (two) times daily. 06/30/20  Yes Nicole Kindred A, DO  metFORMIN (GLUCOPHAGE) 1000 MG tablet Take 1,000 mg by mouth daily with breakfast.    Yes [provider]  potassium chloride SA (KLOR-CON) 20 MEQ tablet Take 2 tablets (40 mEq total) by mouth daily. 06/30/20  Yes Ezekiel Slocumb, DO  sotalol (BETAPACE) 80 MG tablet Take 1 tablet (80 mg total) by mouth every 12 (twelve) hours. 06/19/20  Yes Wieting, Richard, MD  valACYclovir (VALTREX) 500 MG tablet Take 500 mg by mouth daily.    Yes [provider]  pravastatin (PRAVACHOL) 40 MG tablet Take 1 tablet (40 mg total) by mouth every evening. 01/29/20 06/17/20  Pixie Casino, MD    Review of Systems  Constitutional: Positive for fatigue (tire easily). Negative for appetite change.  HENT: Negative for congestion, postnasal drip and sore throat.   Eyes: Negative.   Respiratory: Negative for cough, chest tightness and shortness of breath.   Cardiovascular: Positive for palpitations (at times). Negative for chest pain and leg swelling.  Gastrointestinal: Negative for abdominal distention and abdominal pain.  Endocrine: Negative.   Genitourinary: Negative.   Musculoskeletal: Negative for back pain and neck pain.  Skin: Negative.   Allergic/Immunologic: Negative.   Neurological: Negative for dizziness and light-headedness.  Hematological: Negative for adenopathy. Bruises/bleeds easily.  Psychiatric/Behavioral: Negative for dysphoric mood and sleep disturbance (sleeping on 1 pillow). The patient is not nervous/anxious.    Vitals:   09/09/20 1221  BP: 138/87  Pulse: 88  Resp: 18  SpO2: 98%  Weight: 227 lb 6 oz (103.1 kg)  Height: 5' (1.524 m)   Wt Readings from Last 3 Encounters:  09/09/20 227 lb 6 oz (103.1  kg)  07/12/20 215 lb 6 oz (97.7 kg)  06/30/20 218 lb 9.6 oz (99.2 kg)   Lab Results  Component Value Date   CREATININE 0.81 06/30/2020   CREATININE 0.81 06/29/2020   CREATININE 0.76 06/28/2020   Physical Exam Vitals and nursing note reviewed.  Constitutional:      Appearance: Normal appearance.  HENT:     Head: Normocephalic and atraumatic.  Cardiovascular:     Rate and Rhythm: Normal rate. Rhythm irregular.  Pulmonary:     Effort: Pulmonary effort is normal. No respiratory distress.     Breath sounds: No wheezing or rales.  Abdominal:     General: Abdomen is flat.     Palpations: Abdomen is soft.     Tenderness: There is no abdominal tenderness.  Musculoskeletal:        General: No tenderness.     Cervical back: Normal range of motion  and neck supple.     Right lower leg: No edema.     Left lower leg: No edema.     Comments: Numerous superficial varicose veins on bilateral lower legs  Skin:    General: Skin is warm and dry.  Neurological:     General: No focal deficit present.     Mental Status: She is alert and oriented to person, place, and time.  Psychiatric:        Mood and Affect: Mood normal.        Behavior: Behavior normal.        Thought Content: Thought content normal.     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 (LAE)- - NYHA class III - euvolemic today - weighing daily; reminded to call for an overnight weight gain of > 2 pounds or a weekly weight gain of > 5 pounds - weight up 12 pounds from last visit here 2 months ago - not adding salt to her food and has been reading food labels; discussed keeping sodium content to ~ 2000mg  / day - saw cardiology Humphrey Rolls) 08/05/20 - BNP 06/28/20 was 1131.8 - has received her flu vaccine for this season - has gotten 2 covid vaccines  2: HTN- - BP looks good today - saw PCP Brigitte Pulse) 03/31/20; returns March 2022 - BMP 06/30/20 reviewed and showed sodium  141, potassium 3.0, creatinine 0.81 & GFR >60  3: DM- - A1c 06/17/20 was 5.6% - fasting glucose at home was 103  4: atrial fibrillation- - rate controlled on sotalol and diltiazem - no longer taking digoxin - taking apixaban  5: OSA- - had sleep study January 2022 - awaiting CPAP equipment   Patient did not bring her medications nor a list. Each medication was verbally reviewed with the patient and she was encouraged to bring the bottles to every visit to confirm accuracy of list.  Due to HF stability, will not make a return appointment for patient at this time. Advised patient to follow closely with her cardiologist and PCP but if she ever needs [place], she can call to schedule another appointment and she was comfortable with this plan.

## 2020-09-10 ENCOUNTER — Encounter: Payer: Self-pay | Admitting: Family

## 2020-09-21 DIAGNOSIS — M25551 Pain in right hip: Secondary | ICD-10-CM | POA: Diagnosis not present

## 2020-09-21 DIAGNOSIS — Z13820 Encounter for screening for osteoporosis: Secondary | ICD-10-CM | POA: Diagnosis not present

## 2020-09-21 DIAGNOSIS — E1129 Type 2 diabetes mellitus with other diabetic kidney complication: Secondary | ICD-10-CM | POA: Diagnosis not present

## 2020-09-21 DIAGNOSIS — M5136 Other intervertebral disc degeneration, lumbar region: Secondary | ICD-10-CM | POA: Diagnosis not present

## 2020-09-21 DIAGNOSIS — E785 Hyperlipidemia, unspecified: Secondary | ICD-10-CM | POA: Diagnosis not present

## 2020-09-21 DIAGNOSIS — R82998 Other abnormal findings in urine: Secondary | ICD-10-CM | POA: Diagnosis not present

## 2020-09-22 DIAGNOSIS — M170 Bilateral primary osteoarthritis of knee: Secondary | ICD-10-CM | POA: Diagnosis not present

## 2020-09-22 DIAGNOSIS — M1712 Unilateral primary osteoarthritis, left knee: Secondary | ICD-10-CM | POA: Diagnosis not present

## 2020-09-22 DIAGNOSIS — M25562 Pain in left knee: Secondary | ICD-10-CM | POA: Diagnosis not present

## 2020-09-27 ENCOUNTER — Telehealth: Payer: Self-pay | Admitting: Physician Assistant

## 2020-09-27 NOTE — Telephone Encounter (Signed)
Pt said she threw out her knee and would love to be worked in if possible by [employer]. Let me know if she can be seen and ill give her a call.

## 2020-09-27 NOTE — Telephone Encounter (Signed)
I will let you look at your schedule and let me know

## 2020-09-27 NOTE — Telephone Encounter (Signed)
tomorrow

## 2020-09-27 NOTE — Telephone Encounter (Signed)
Worked in tomorrow morning

## 2020-09-28 ENCOUNTER — Encounter: Payer: Self-pay | Admitting: Physician Assistant

## 2020-09-28 ENCOUNTER — Other Ambulatory Visit: Payer: Self-pay

## 2020-09-28 ENCOUNTER — Ambulatory Visit (INDEPENDENT_AMBULATORY_CARE_PROVIDER_SITE_OTHER): Payer: Medicare Other

## 2020-09-28 ENCOUNTER — Ambulatory Visit (INDEPENDENT_AMBULATORY_CARE_PROVIDER_SITE_OTHER): Payer: Medicare Other | Admitting: Physician Assistant

## 2020-09-28 DIAGNOSIS — M25562 Pain in left knee: Secondary | ICD-10-CM

## 2020-09-28 NOTE — Progress Notes (Signed)
Office Visit Note   Patient: Tammy [name]           Date of Birth: 02-28-1951           MRN: [id number] Visit Date: 09/28/2020              Requested by: Ginger Organ., MD [address] [place],  [place] [address] PCP: Ginger Organ., MD   Assessment & Plan: Visit Diagnoses:  1. Left knee pain, unspecified chronicity     Plan: Discussed with patient that at this point time we are unable to give any other injections in the knee.  She is unable to take NSAIDs due to her anticoagulation.  She has failed conservative treatment and most likely with benefit from total knee replacement.  However her BMI is 44.3 and she is on anticoagulation.  She definitely needs clearance from her cardiologist to come off of the anticoagulation and she would need to lose weight.  Discussed quad strengthening exercises with her.  Hopefully the cortisone injection will start working over the next few days.  We did give her a hinged knee brace to give her some stability with the knee.  She has a follow-up with Dr. Ninfa Linden next week for her and at that point time we can reevaluate knee.  Questions were encouraged and answered at length.  She will continue to use a walker weightbearing as tolerated on the left lower extremity.  Follow-Up Instructions: Return in about 1 week (around 10/05/2020).   Orders:  Orders Placed This Encounter  Procedures  . XR Knee 1-2 Views Left   No orders of the defined types were placed in this encounter.     Procedures: No procedures performed   Clinical Data: No additional findings.   Subjective: Chief Complaint  Patient presents with  . Left Knee - Pain    HPI Tammy [name] comes in today for left knee pain.  She states her left knee gave out on Saturday.  A week ago she had a cortisone injection at [facility] where she has been undergoing supplemental injections.  She states she is having pain in the knee when standing.  She is using a  walker.  She is taking hydrocodone but this really is not helping with the knee pain.  She has had no new injury to the left knee. Patient is diabetic but reports good control of her diabetes hemoglobin A1c of 5.6.  She is also on Eliquis due to atrial fibrillation. Review of Systems See HPI.  Objective: Vital Signs: There were no vitals taken for this visit.  Physical Exam General: Well-developed well-nourished female no acute distress mood affect appropriate. Psych: Alert and oriented x3. Ortho Exam Left knee no gross deformity.  Overall good range of motion of the knee with patellofemoral crepitus.  No abnormal warmth erythema or effusion. Specialty Comments:  No specialty comments available.  Imaging: XR Knee 1-2 Views Left  Result Date: 09/28/2020 Left knee 2 views: Severe patellofemoral arthritis.  Near bone-on-bone lateral compartment.  Moderate narrowing of the medial compartment.  No bony abnormalities no acute fractures.  Knee is well located.    PMFS History: Patient Active Problem List   Diagnosis Date Noted  . Respiratory failure, acute ([place]) 06/28/2020  . Acute diastolic CHF (congestive heart failure) ([place]) 06/28/2020  . Atrial fibrillation with rapid ventricular response ([place]) 06/28/2020  . Acquired thrombophilia ([place])   . Depression   . Atrial fibrillation with RVR ([place])  06/16/2020  . Diabetes mellitus ([place])   . HTN (hypertension)   . Sleep apnea   . Obesity, Class III, BMI 40-49.9 (morbid obesity) ([place])   . Chronic venous insufficiency 12/30/2018  . Varicose veins of both lower extremities with inflammation 12/30/2018  . DJD (degenerative joint disease) 12/30/2018  . Snoring 11/27/2018  . Daytime sleepiness 11/27/2018  . Excessive sleepiness 11/27/2018  . Educated about COVID-19 virus infection 11/27/2018  . SOB (shortness of breath) 11/27/2018  . Hyperlipidemia 05/20/2015  . Knee pain 06/06/2012   Past Medical History:  Diagnosis Date  . Agatston  coronary artery calcium score greater than 400   . Arrhythmia    atrial fibrillation  . CHF (congestive heart failure) ([place])   . Diabetes mellitus ([place])    x 3 years  . DJD (degenerative joint disease)   . Encephalitis   . Fibromyalgia   . HTN (hypertension)   . Hyperlipidemia   . Sleep apnea    No CPAP    Family History  Problem Relation Age of Onset  . CAD Mother 84  . CAD Brother 58  . Breast cancer Cousin     Past Surgical History:  Procedure Laterality Date  . APPENDECTOMY    . BREAST CYST EXCISION    . BREAST EXCISIONAL BIOPSY Left 2004  . KNEE ARTHROSCOPY    . TEE WITHOUT CARDIOVERSION N/A 06/29/2020   Procedure: TRANSESOPHAGEAL ECHOCARDIOGRAM (TEE) with DCCV;  Surgeon: Dionisio David, MD;  Location: [facility] ORS;  Service: Cardiovascular;  Laterality: N/A;   Social History   Occupational History  . Not on file  Tobacco Use  . Smoking status: Former Smoker    Packs/day: 1.00    Years: 30.00    Pack years: 30.00    Types: Cigarettes    Quit date: 11/01/2008    Years since quitting: 11.9  . Smokeless tobacco: Never Used  Vaping Use  . Vaping Use: Never used  Substance and Sexual Activity  . Alcohol use: No    Alcohol/week: 0.0 standard drinks  . Drug use: No  . Sexual activity: Not on file

## 2020-09-29 DIAGNOSIS — Z1339 Encounter for screening examination for other mental health and behavioral disorders: Secondary | ICD-10-CM | POA: Diagnosis not present

## 2020-09-29 DIAGNOSIS — E1129 Type 2 diabetes mellitus with other diabetic kidney complication: Secondary | ICD-10-CM | POA: Diagnosis not present

## 2020-09-29 DIAGNOSIS — E785 Hyperlipidemia, unspecified: Secondary | ICD-10-CM | POA: Diagnosis not present

## 2020-09-29 DIAGNOSIS — D6869 Other thrombophilia: Secondary | ICD-10-CM | POA: Diagnosis not present

## 2020-09-29 DIAGNOSIS — I480 Paroxysmal atrial fibrillation: Secondary | ICD-10-CM | POA: Diagnosis not present

## 2020-09-29 DIAGNOSIS — Z1331 Encounter for screening for depression: Secondary | ICD-10-CM | POA: Diagnosis not present

## 2020-09-29 DIAGNOSIS — Z0000 Encounter for general adult medical examination without abnormal findings: Secondary | ICD-10-CM | POA: Diagnosis not present

## 2020-09-29 DIAGNOSIS — I700 Atherosclerosis of aorta: Secondary | ICD-10-CM | POA: Diagnosis not present

## 2020-09-29 DIAGNOSIS — I2510 Atherosclerotic heart disease of native coronary artery without angina pectoris: Secondary | ICD-10-CM | POA: Diagnosis not present

## 2020-09-29 DIAGNOSIS — I10 Essential (primary) hypertension: Secondary | ICD-10-CM | POA: Diagnosis not present

## 2020-09-29 DIAGNOSIS — J439 Emphysema, unspecified: Secondary | ICD-10-CM | POA: Diagnosis not present

## 2020-09-29 DIAGNOSIS — E1149 Type 2 diabetes mellitus with other diabetic neurological complication: Secondary | ICD-10-CM | POA: Diagnosis not present

## 2020-09-29 DIAGNOSIS — G729 Myopathy, unspecified: Secondary | ICD-10-CM | POA: Diagnosis not present

## 2020-10-01 ENCOUNTER — Other Ambulatory Visit: Payer: Self-pay | Admitting: Internal Medicine

## 2020-10-01 DIAGNOSIS — F17201 Nicotine dependence, unspecified, in remission: Secondary | ICD-10-CM

## 2020-10-04 DIAGNOSIS — E782 Mixed hyperlipidemia: Secondary | ICD-10-CM | POA: Diagnosis not present

## 2020-10-04 DIAGNOSIS — K921 Melena: Secondary | ICD-10-CM | POA: Diagnosis not present

## 2020-10-04 DIAGNOSIS — I10 Essential (primary) hypertension: Secondary | ICD-10-CM | POA: Diagnosis not present

## 2020-10-04 DIAGNOSIS — G4730 Sleep apnea, unspecified: Secondary | ICD-10-CM | POA: Diagnosis not present

## 2020-10-04 DIAGNOSIS — R0602 Shortness of breath: Secondary | ICD-10-CM | POA: Diagnosis not present

## 2020-10-04 DIAGNOSIS — I4891 Unspecified atrial fibrillation: Secondary | ICD-10-CM | POA: Diagnosis not present

## 2020-10-06 ENCOUNTER — Ambulatory Visit (INDEPENDENT_AMBULATORY_CARE_PROVIDER_SITE_OTHER): Payer: Medicare Other | Admitting: Orthopaedic Surgery

## 2020-10-06 ENCOUNTER — Encounter: Payer: Self-pay | Admitting: Orthopaedic Surgery

## 2020-10-06 VITALS — Ht 60.0 in | Wt 228.8 lb

## 2020-10-06 DIAGNOSIS — M4807 Spinal stenosis, lumbosacral region: Secondary | ICD-10-CM | POA: Diagnosis not present

## 2020-10-06 DIAGNOSIS — M25562 Pain in left knee: Secondary | ICD-10-CM | POA: Diagnosis not present

## 2020-10-06 DIAGNOSIS — G8929 Other chronic pain: Secondary | ICD-10-CM

## 2020-10-06 DIAGNOSIS — M1712 Unilateral primary osteoarthritis, left knee: Secondary | ICD-10-CM | POA: Insufficient documentation

## 2020-10-06 NOTE — Progress Notes (Signed)
Well-known to me.  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  She does wear knee brace on that knee.  She has had several interventions remotely in the past including a arthroscopic intervention with a meniscal surgery in [place].  She has had steroid injections and gel injections in that knee.  She does have a BMI of almost 45.  She has been seen by cardiology and cleared for knee replacement surgery.  However, given her BMI, we have to delay surgery until it is below 40.  I explained this to her in detail about the risk and benefits of surgery and we had a long thorough discussion about the height and risks of failure given her weight.  I have recommended weight loss and she is working on this.  The knee brace has helped some.  An epidural steroid injection in her lumbar spine only helped for about a week in terms of her spinal stenosis.  She has been through physical therapy as well.  I did show her knee replacement model and explained in detail what knee replacement surgery involves.  The knee has a painful arc of motion of that knee.  We can see her back in 3 months for repeat weight and BMI calculation.  She understands this plan as well.

## 2020-10-15 ENCOUNTER — Ambulatory Visit
Admission: RE | Admit: 2020-10-15 | Discharge: 2020-10-15 | Disposition: A | Discharge status: Home / Self Care | Payer: Medicare Other | Source: Ambulatory Visit | Attending: Internal Medicine | Admitting: Internal Medicine

## 2020-10-15 DIAGNOSIS — Z87891 Personal history of nicotine dependence: Secondary | ICD-10-CM | POA: Diagnosis not present

## 2020-10-15 DIAGNOSIS — F17201 Nicotine dependence, unspecified, in remission: Secondary | ICD-10-CM

## 2020-10-19 ENCOUNTER — Other Ambulatory Visit: Payer: Self-pay | Admitting: Gastroenterology

## 2020-10-19 DIAGNOSIS — K5730 Diverticulosis of large intestine without perforation or abscess without bleeding: Secondary | ICD-10-CM | POA: Diagnosis not present

## 2020-10-19 DIAGNOSIS — Z800 Family history of malignant neoplasm of digestive organs: Secondary | ICD-10-CM | POA: Diagnosis not present

## 2020-10-19 DIAGNOSIS — K921 Melena: Secondary | ICD-10-CM | POA: Diagnosis not present

## 2020-10-19 DIAGNOSIS — Z1211 Encounter for screening for malignant neoplasm of colon: Secondary | ICD-10-CM | POA: Diagnosis not present

## 2020-10-23 DIAGNOSIS — U071 COVID-19: Secondary | ICD-10-CM | POA: Diagnosis not present

## 2020-11-15 ENCOUNTER — Encounter: Payer: Self-pay | Admitting: Orthopaedic Surgery

## 2020-11-23 ENCOUNTER — Encounter (HOSPITAL_COMMUNITY): Payer: Self-pay | Admitting: Gastroenterology

## 2020-11-23 ENCOUNTER — Other Ambulatory Visit: Payer: Self-pay

## 2020-11-23 ENCOUNTER — Other Ambulatory Visit (HOSPITAL_COMMUNITY)
Admission: RE | Admit: 2020-11-23 | Discharge: 2020-11-23 | Disposition: A | Discharge status: Home / Self Care | Payer: Medicare Other | Source: Ambulatory Visit | Attending: Gastroenterology | Admitting: Gastroenterology

## 2020-11-23 ENCOUNTER — Encounter: Payer: Self-pay | Admitting: Orthopaedic Surgery

## 2020-11-23 DIAGNOSIS — Z01812 Encounter for preprocedural laboratory examination: Secondary | ICD-10-CM | POA: Diagnosis not present

## 2020-11-23 DIAGNOSIS — Z20822 Contact with and (suspected) exposure to covid-19: Secondary | ICD-10-CM | POA: Insufficient documentation

## 2020-11-23 LAB — SARS CORONAVIRUS 2 (TAT 6-24 HRS): SARS Coronavirus 2: NEGATIVE

## 2020-11-24 ENCOUNTER — Telehealth: Payer: Self-pay | Admitting: Orthopaedic Surgery

## 2020-11-24 DIAGNOSIS — M25562 Pain in left knee: Secondary | ICD-10-CM | POA: Diagnosis not present

## 2020-11-24 DIAGNOSIS — M25561 Pain in right knee: Secondary | ICD-10-CM | POA: Diagnosis not present

## 2020-11-24 DIAGNOSIS — M170 Bilateral primary osteoarthritis of knee: Secondary | ICD-10-CM | POA: Diagnosis not present

## 2020-11-24 DIAGNOSIS — M1712 Unilateral primary osteoarthritis, left knee: Secondary | ICD-10-CM | POA: Diagnosis not present

## 2020-11-24 NOTE — Telephone Encounter (Signed)
Received medical records release form from patient  

## 2020-11-26 ENCOUNTER — Ambulatory Visit (HOSPITAL_COMMUNITY)
Admission: RE | Admit: 2020-11-26 | Discharge: 2020-11-26 | Disposition: A | Discharge status: Home / Self Care | Payer: Medicare Other | Attending: Gastroenterology | Admitting: Gastroenterology

## 2020-11-26 ENCOUNTER — Ambulatory Visit (HOSPITAL_COMMUNITY): Payer: Medicare Other | Admitting: Certified Registered Nurse Anesthetist

## 2020-11-26 ENCOUNTER — Encounter (HOSPITAL_COMMUNITY)
Admission: RE | Disposition: A | Discharge status: Home / Self Care | Payer: Self-pay | Source: Home / Self Care | Attending: Gastroenterology

## 2020-11-26 ENCOUNTER — Encounter (HOSPITAL_COMMUNITY): Payer: Self-pay | Admitting: Gastroenterology

## 2020-11-26 ENCOUNTER — Other Ambulatory Visit: Payer: Self-pay

## 2020-11-26 DIAGNOSIS — Z888 Allergy status to other drugs, medicaments and biological substances status: Secondary | ICD-10-CM | POA: Insufficient documentation

## 2020-11-26 DIAGNOSIS — Z800 Family history of malignant neoplasm of digestive organs: Secondary | ICD-10-CM | POA: Insufficient documentation

## 2020-11-26 DIAGNOSIS — D120 Benign neoplasm of cecum: Secondary | ICD-10-CM | POA: Diagnosis not present

## 2020-11-26 DIAGNOSIS — Z91041 Radiographic dye allergy status: Secondary | ICD-10-CM | POA: Diagnosis not present

## 2020-11-26 DIAGNOSIS — K921 Melena: Secondary | ICD-10-CM | POA: Diagnosis not present

## 2020-11-26 DIAGNOSIS — Z7901 Long term (current) use of anticoagulants: Secondary | ICD-10-CM | POA: Diagnosis not present

## 2020-11-26 DIAGNOSIS — Z87891 Personal history of nicotine dependence: Secondary | ICD-10-CM | POA: Insufficient documentation

## 2020-11-26 DIAGNOSIS — K5730 Diverticulosis of large intestine without perforation or abscess without bleeding: Secondary | ICD-10-CM | POA: Insufficient documentation

## 2020-11-26 DIAGNOSIS — I4891 Unspecified atrial fibrillation: Secondary | ICD-10-CM | POA: Insufficient documentation

## 2020-11-26 DIAGNOSIS — Z91013 Allergy to seafood: Secondary | ICD-10-CM | POA: Insufficient documentation

## 2020-11-26 DIAGNOSIS — D122 Benign neoplasm of ascending colon: Secondary | ICD-10-CM | POA: Diagnosis not present

## 2020-11-26 DIAGNOSIS — D123 Benign neoplasm of transverse colon: Secondary | ICD-10-CM | POA: Insufficient documentation

## 2020-11-26 DIAGNOSIS — G4730 Sleep apnea, unspecified: Secondary | ICD-10-CM | POA: Diagnosis not present

## 2020-11-26 DIAGNOSIS — K635 Polyp of colon: Secondary | ICD-10-CM | POA: Diagnosis not present

## 2020-11-26 DIAGNOSIS — R195 Other fecal abnormalities: Secondary | ICD-10-CM | POA: Diagnosis not present

## 2020-11-26 HISTORY — PX: COLONOSCOPY WITH PROPOFOL: SHX5780

## 2020-11-26 HISTORY — PX: POLYPECTOMY: SHX5525

## 2020-11-26 HISTORY — PX: HEMOSTASIS CLIP PLACEMENT: SHX6857

## 2020-11-26 HISTORY — PX: SUBMUCOSAL TATTOO INJECTION: SHX6856

## 2020-11-26 LAB — GLUCOSE, CAPILLARY: Glucose-Capillary: 106 mg/dL — ABNORMAL HIGH (ref 70–99)

## 2020-11-26 SURGERY — COLONOSCOPY WITH PROPOFOL
Anesthesia: Monitor Anesthesia Care

## 2020-11-26 MED ORDER — PROPOFOL 500 MG/50ML IV EMUL
INTRAVENOUS | Status: DC | PRN
  Administered 2020-11-26: 150 ug/kg/min via INTRAVENOUS

## 2020-11-26 MED ORDER — LACTATED RINGERS IV SOLN: INTRAVENOUS | Status: DC

## 2020-11-26 MED ORDER — SPOT INK MARKER SYRINGE KIT
PACK | SUBMUCOSAL | Status: AC
  Filled 2020-11-26: qty 5

## 2020-11-26 MED ORDER — PROPOFOL 10 MG/ML IV BOLUS
INTRAVENOUS | Status: DC | PRN
  Administered 2020-11-26: 20 mg via INTRAVENOUS
  Administered 2020-11-26 (×2): 40 mg via INTRAVENOUS

## 2020-11-26 MED ORDER — SPOT INK MARKER SYRINGE KIT
PACK | SUBMUCOSAL | Status: DC | PRN
  Administered 2020-11-26: 1 mL via SUBMUCOSAL

## 2020-11-26 MED ORDER — PROPOFOL 500 MG/50ML IV EMUL
INTRAVENOUS | Status: AC
  Filled 2020-11-26: qty 150

## 2020-11-26 MED ORDER — PHENYLEPHRINE 40 MCG/ML (10ML) SYRINGE FOR IV PUSH (FOR BLOOD PRESSURE SUPPORT)
PREFILLED_SYRINGE | INTRAVENOUS | Status: DC | PRN
  Administered 2020-11-26 (×3): 80 ug via INTRAVENOUS

## 2020-11-26 SURGICAL SUPPLY — 22 items

## 2020-11-26 NOTE — Consult Note (Signed)
  Tammy [name]   HPI: This 70 year old white female presents to the office for colorectal cancer screening. She was found to have a positive FIT test in March, 2022. She has 1 BM per day with no obvious blood or mucus in the stool. In December, 2021 she was diagnosed with atrial fibrillation and is now on Eliquis. She has a good appetite and has lost 15 pounds since 2019. She denies having any complaints of abdominal pain, nausea, vomiting, acid reflux, dysphagia or odynophagia. She denies having a family history of celiac sprue or IBD. Her paternal grandfather was diagnosed with colon cancer in his 11's. Her last colonoscopy was done on 11/15/2011 which revealed scattered diverticula, internal hemorrhoids, ? vascular ectasia non bleeding in the mid-right colon and hyperplastic polyps were removed from the descending colon at 60 cm and from the rectum.    Past Medical History:  Diagnosis Date  . Agatston coronary artery calcium score greater than 400   . Arrhythmia    atrial fibrillation  . CHF (congestive heart failure) ([place])   . Diabetes mellitus ([place])    x 3 years  . DJD (degenerative joint disease)   . Encephalitis   . Fibromyalgia   . HTN (hypertension)   . Hyperlipidemia   . Sleep apnea    No CPAP    Past Surgical History:  Procedure Laterality Date  . APPENDECTOMY    . BREAST CYST EXCISION    . BREAST EXCISIONAL BIOPSY Left 2004  . KNEE ARTHROSCOPY    . TEE WITHOUT CARDIOVERSION N/A 06/29/2020   Procedure: TRANSESOPHAGEAL ECHOCARDIOGRAM (TEE) with DCCV;  Surgeon: Dionisio David, MD;  Location: [facility] ORS;  Service: Cardiovascular;  Laterality: N/A;    Family History  Problem Relation Age of Onset  . CAD Mother 35  . CAD Brother 24  . Breast cancer Cousin     Social History:  reports that she quit smoking about 12 years ago. Her smoking use included cigarettes. She has a 30.00 pack-year smoking history. She has never used smokeless tobacco. She reports that she  does not drink alcohol and does not use drugs.  Allergies:  Allergies  Allergen Reactions  . Betadine [Povidone Iodine] Anaphylaxis  . Contrast Media [Iodinated Diagnostic Agents] Anaphylaxis  . Iodine Anaphylaxis  . Metrizamide Anaphylaxis  . Povidone-Iodine Anaphylaxis  . Shellfish Allergy Anaphylaxis  . Hydrocodone-Acetaminophen Nausea Only    Medications:  Scheduled:  Continuous: . lactated ringers 20 mL/hr at 11/26/20 1016    Results for orders placed or performed during the hospital encounter of 11/26/20 (from the past 24 hour(s))  Glucose, capillary     Status: Abnormal   Collection Time: 11/26/20 10:16 AM  Result Value Ref Range   Glucose-Capillary 106 (H) 70 - 99 mg/dL     No results found.  ROS:  As stated above in the HPI otherwise negative.  Blood pressure 138/75, pulse 78, temperature 97.7 F (36.5 C), temperature source Temporal, resp. rate 14, height 5' (1.524 m), weight 104.3 kg, SpO2 97 %.    PE: Gen: NAD, Alert and Oriented HEENT:  [place]/AT, EOMI Neck: Supple, no LAD Lungs: CTA Bilaterally CV: RRR without M/G/R ABD: Soft, NTND, +BS Ext: No C/C/E  Assessment/Plan: 1) Positive FIT - colonoscopy  Garry Nicolini D 11/26/2020, 10:53 AM

## 2020-11-26 NOTE — Anesthesia Procedure Notes (Addendum)
Procedure Name: MAC Date/Time: 11/26/2020 10:53 AM Performed by: West Pugh, CRNA Pre-anesthesia Checklist: Patient identified, Emergency Drugs available, Suction available, Patient being monitored and Timeout performed Patient Re-evaluated:Patient Re-evaluated prior to induction Oxygen Delivery Method: Simple face mask Preoxygenation: Pre-oxygenation with 100% oxygen Induction Type: IV induction Placement Confirmation: positive ETCO2 Dental Injury: Teeth and Oropharynx as per pre-operative assessment

## 2020-11-26 NOTE — Anesthesia Postprocedure Evaluation (Signed)
Anesthesia Post Note  Patient: Tammy [name]  Procedure(s) Performed: COLONOSCOPY WITH PROPOFOL (N/A ) POLYPECTOMY HEMOSTASIS CLIP PLACEMENT SUBMUCOSAL TATTOO INJECTION     Patient location during evaluation: Endoscopy Anesthesia Type: MAC Level of consciousness: awake Pain management: pain level controlled Vital Signs Assessment: post-procedure vital signs reviewed and stable Respiratory status: spontaneous breathing Cardiovascular status: stable Postop Assessment: no apparent nausea or vomiting Anesthetic complications: no   No complications documented.  Last Vitals:  Vitals:   11/26/20 1150 11/26/20 1200  BP: (!) 124/55 (!) 141/70  Pulse: 66 67  Resp: 14 13  Temp:    SpO2: 98% 98%    Last Pain:  Vitals:   11/26/20 1200  TempSrc:   PainSc: 0-No pain                 Breon Rehm

## 2020-11-26 NOTE — Anesthesia Preprocedure Evaluation (Addendum)
Anesthesia Evaluation  Patient identified by MRN, date of birth, ID band Patient awake    Reviewed: Allergy & Precautions, NPO status , Patient's Chart, lab work & pertinent test results  Airway Mallampati: II  TM Distance: >3 FB     Dental   Pulmonary sleep apnea , former smoker,    breath sounds clear to auscultation       Cardiovascular hypertension, + CAD and +CHF   Rhythm:Regular Rate:Normal     Neuro/Psych PSYCHIATRIC DISORDERS Depression  Neuromuscular disease    GI/Hepatic negative GI ROS, Neg liver ROS,   Endo/Other  diabetes  Renal/GU negative Renal ROS     Musculoskeletal  (+) Arthritis , Fibromyalgia -  Abdominal   Peds  Hematology   Anesthesia Other Findings   Reproductive/Obstetrics                             Anesthesia Physical Anesthesia Plan  ASA: III  Anesthesia Plan: MAC   Post-op Pain Management:    Induction: Intravenous  PONV Risk Score and Plan: Propofol infusion  Airway Management Planned: Nasal Cannula and Simple Face Mask  Additional Equipment:   Intra-op Plan:   Post-operative Plan:   Informed Consent: I have reviewed the patients History and Physical, chart, labs and discussed the procedure including the risks, benefits and alternatives for the proposed anesthesia with the patient or authorized representative who has indicated his/her understanding and acceptance.     Dental advisory given  Plan Discussed with: CRNA and Anesthesiologist  Anesthesia Plan Comments:         Anesthesia Quick Evaluation

## 2020-11-26 NOTE — Op Note (Signed)
[facility] Patient Name: Tammy [name] Procedure Date: 11/26/2020 MRN: [id number] Attending MD: Carol Ada , MD Date of Birth: 1950/10/16 CSN: [id number] Age: 70 Admit Type: Outpatient Procedure:                Colonoscopy Indications:              Positive fecal immunochemical test Providers:                Carol Ada, MD, Mariana Arn, Ladona Ridgel,                            Technician, Margurite Auerbach Referring MD:              Medicines:                Propofol per Anesthesia Complications:            No immediate complications. Estimated Blood Loss:     Estimated blood loss was minimal. Procedure:                Pre-Anesthesia Assessment:                           - Prior to the procedure, a History and Physical                            was performed, and patient medications and                            allergies were reviewed. [name] tolerance of                            previous anesthesia was also reviewed. The risks                            and benefits of the procedure and the sedation                            options and risks were discussed with the patient.                            All questions were answered, and informed consent                            was obtained. Prior Anticoagulants: The patient has                            taken no previous anticoagulant or antiplatelet                            agents. ASA Grade Assessment: III - A patient with                            severe systemic disease. After reviewing the risks  and benefits, the patient was deemed in                            satisfactory condition to undergo the procedure.                           - Sedation was administered by an anesthesia                            professional. Deep sedation was attained.                           After obtaining informed consent, the colonoscope                            was passed  under direct vision. Throughout the                            procedure, the patient's blood pressure, pulse, and                            oxygen saturations were monitored continuously. The                            CF-HQ190L ([id number]) Olympus colonoscope was                            introduced through the anus and advanced to the the                            cecum, identified by appendiceal orifice and                            ileocecal valve. The colonoscopy was performed                            without difficulty. The patient tolerated the                            procedure well. The quality of the bowel                            preparation was good. The ileocecal valve,                            appendiceal orifice, and rectum were photographed. Scope In: 11:00:18 AM Scope Out: 11:37:00 AM Scope Withdrawal Time: 0 hours 33 minutes 53 seconds  Total Procedure Duration: 0 hours 36 minutes 42 seconds  Findings:      Eight sessile polyps were found in the transverse colon, ascending colon       and cecum. The polyps were 2 to 10 mm in size. These polyps were removed       with a cold snare. Resection and retrieval were complete. To prevent       bleeding post-intervention, three hemostatic clips  were successfully       placed (MR conditional). There was no bleeding at the end of the       procedure.      A larger sessile polyp was found in the cecum. It was unclear if this       was a recurrent polyp was there was a suggestion of a scar in the area.       A piecemeal cold snare approach was performed. Three hemoclips were       deployed to prevent any bleeding or perforation. Distal to the area SPOT       was injected.      Scattered small and large-mouthed diverticula were found in the sigmoid       colon, descending colon and transverse colon. Impression:               - Eight 2 to 10 mm polyps in the transverse colon,                            in the ascending  colon and in the cecum, removed                            with a cold snare. Resected and retrieved. Clips                            (MR conditional) were placed.                           - Diverticulosis in the sigmoid colon, in the                            descending colon and in the transverse colon. Moderate Sedation:      Not Applicable - Patient had care per Anesthesia. Recommendation:           - Patient has a contact number available for                            emergencies. The signs and symptoms of potential                            delayed complications were discussed with the                            patient. Return to normal activities tomorrow.                            Written discharge instructions were provided to the                            patient.                           - Resume previous diet.                           - Continue present medications.                           -  Await pathology results.                           - Repeat colonoscopy in 6 - 12 months for                            surveillance. Procedure Code(s):        --- Professional ---                           [phone], Colonoscopy, flexible; with removal of                            tumor(s), polyp(s), or other lesion(s) by snare                            technique Diagnosis Code(s):        --- Professional ---                           K63.5, Polyp of colon                           R19.5, Other fecal abnormalities                           K57.30, Diverticulosis of large intestine without                            perforation or abscess without bleeding CPT copyright 2019 American Medical Association. All rights reserved. The codes documented in this report are preliminary and upon coder review may  be revised to meet current compliance requirements. Carol Ada, MD Carol Ada, MD 11/26/2020 11:51:06 AM This report has been signed electronically. Number of Addenda: 0

## 2020-11-26 NOTE — Discharge Instructions (Signed)

## 2020-11-26 NOTE — Transfer of Care (Signed)
Immediate Anesthesia Transfer of Care Note  Patient: Tammy [name]  Procedure(s) Performed: COLONOSCOPY WITH PROPOFOL (N/A ) POLYPECTOMY HEMOSTASIS CLIP PLACEMENT SUBMUCOSAL TATTOO INJECTION  Patient Location: PACU and Endoscopy Unit  Anesthesia Type:MAC  Level of Consciousness: awake, alert  and patient cooperative  Airway & Oxygen Therapy: Patient Spontanous Breathing and Patient connected to face mask oxygen  Post-op Assessment: Report given to RN and Post -op Vital signs reviewed and stable  Post vital signs: Reviewed and stable  Last Vitals:  Vitals Value Taken Time  BP    Temp    Pulse 68 11/26/20 1144  Resp 8 11/26/20 1144  SpO2 99 % 11/26/20 1144  Vitals shown include unvalidated device data.  Last Pain:  Vitals:   11/26/20 1009  TempSrc: Temporal  PainSc: 0-No pain         Complications: No complications documented.

## 2020-11-29 ENCOUNTER — Encounter (HOSPITAL_COMMUNITY): Payer: Self-pay | Admitting: Gastroenterology

## 2020-11-29 LAB — SURGICAL PATHOLOGY

## 2020-12-01 DIAGNOSIS — M4316 Spondylolisthesis, lumbar region: Secondary | ICD-10-CM | POA: Diagnosis not present

## 2020-12-01 DIAGNOSIS — M48062 Spinal stenosis, lumbar region with neurogenic claudication: Secondary | ICD-10-CM | POA: Diagnosis not present

## 2020-12-01 DIAGNOSIS — M47896 Other spondylosis, lumbar region: Secondary | ICD-10-CM | POA: Diagnosis not present

## 2020-12-01 DIAGNOSIS — M25561 Pain in right knee: Secondary | ICD-10-CM | POA: Diagnosis not present

## 2020-12-01 DIAGNOSIS — M1711 Unilateral primary osteoarthritis, right knee: Secondary | ICD-10-CM | POA: Diagnosis not present

## 2020-12-02 DIAGNOSIS — M25562 Pain in left knee: Secondary | ICD-10-CM | POA: Diagnosis not present

## 2020-12-02 DIAGNOSIS — M1712 Unilateral primary osteoarthritis, left knee: Secondary | ICD-10-CM | POA: Diagnosis not present

## 2020-12-07 DIAGNOSIS — M4316 Spondylolisthesis, lumbar region: Secondary | ICD-10-CM | POA: Diagnosis not present

## 2020-12-07 DIAGNOSIS — M48062 Spinal stenosis, lumbar region with neurogenic claudication: Secondary | ICD-10-CM | POA: Diagnosis not present

## 2020-12-08 DIAGNOSIS — M1711 Unilateral primary osteoarthritis, right knee: Secondary | ICD-10-CM | POA: Diagnosis not present

## 2020-12-08 DIAGNOSIS — M25561 Pain in right knee: Secondary | ICD-10-CM | POA: Diagnosis not present

## 2020-12-09 DIAGNOSIS — M25562 Pain in left knee: Secondary | ICD-10-CM | POA: Diagnosis not present

## 2020-12-09 DIAGNOSIS — M1712 Unilateral primary osteoarthritis, left knee: Secondary | ICD-10-CM | POA: Diagnosis not present

## 2020-12-15 DIAGNOSIS — M25561 Pain in right knee: Secondary | ICD-10-CM | POA: Diagnosis not present

## 2020-12-15 DIAGNOSIS — M1711 Unilateral primary osteoarthritis, right knee: Secondary | ICD-10-CM | POA: Diagnosis not present

## 2020-12-16 DIAGNOSIS — M1712 Unilateral primary osteoarthritis, left knee: Secondary | ICD-10-CM | POA: Diagnosis not present

## 2020-12-16 DIAGNOSIS — M25562 Pain in left knee: Secondary | ICD-10-CM | POA: Diagnosis not present

## 2020-12-22 DIAGNOSIS — M1711 Unilateral primary osteoarthritis, right knee: Secondary | ICD-10-CM | POA: Diagnosis not present

## 2020-12-22 DIAGNOSIS — M25561 Pain in right knee: Secondary | ICD-10-CM | POA: Diagnosis not present

## 2020-12-23 DIAGNOSIS — M25562 Pain in left knee: Secondary | ICD-10-CM | POA: Diagnosis not present

## 2020-12-23 DIAGNOSIS — M1712 Unilateral primary osteoarthritis, left knee: Secondary | ICD-10-CM | POA: Diagnosis not present

## 2020-12-24 DIAGNOSIS — M4316 Spondylolisthesis, lumbar region: Secondary | ICD-10-CM | POA: Diagnosis not present

## 2020-12-24 DIAGNOSIS — M48061 Spinal stenosis, lumbar region without neurogenic claudication: Secondary | ICD-10-CM | POA: Diagnosis not present

## 2020-12-24 DIAGNOSIS — M5127 Other intervertebral disc displacement, lumbosacral region: Secondary | ICD-10-CM | POA: Diagnosis not present

## 2020-12-24 DIAGNOSIS — M5136 Other intervertebral disc degeneration, lumbar region: Secondary | ICD-10-CM | POA: Diagnosis not present

## 2020-12-24 DIAGNOSIS — M4807 Spinal stenosis, lumbosacral region: Secondary | ICD-10-CM | POA: Diagnosis not present

## 2020-12-24 DIAGNOSIS — M47816 Spondylosis without myelopathy or radiculopathy, lumbar region: Secondary | ICD-10-CM | POA: Diagnosis not present

## 2020-12-30 DIAGNOSIS — M1711 Unilateral primary osteoarthritis, right knee: Secondary | ICD-10-CM | POA: Diagnosis not present

## 2020-12-30 DIAGNOSIS — R931 Abnormal findings on diagnostic imaging of heart and coronary circulation: Secondary | ICD-10-CM | POA: Insufficient documentation

## 2020-12-30 DIAGNOSIS — R5383 Other fatigue: Secondary | ICD-10-CM | POA: Insufficient documentation

## 2020-12-30 DIAGNOSIS — M25561 Pain in right knee: Secondary | ICD-10-CM | POA: Diagnosis not present

## 2020-12-30 NOTE — Progress Notes (Addendum)
Cardiology Office Note   Date:  12/31/2020   ID:  Evy, Lutterman July 11, 1951, MRN [id number]  PCP:  Ginger Organ., MD  Cardiologist:   [name], MD   Chief Complaint  Patient presents with   Atrial Fibrillation       History of Present Illness: Tammy [name] is a 70 y.o. female who presents for follow up of coronary calcium. Her level is greater than 99 percentile for age.  I sent her for a POET (Plain Old Exercise Treadmill) in April 2018 that was negative for evidence of ischemia.  She returns for follow up.  She has had a treadmill the other day that was also negative for any evidence of ischemia.  This was done to follow-up her calcium score which is greater than 800 in 2016.  Since I last saw her she was hospitalized a couple of times at [facility].  I reviewed these records for this visit.  She developed new onset atrial fibrillation with rapid rate.  She was actually seen by another practice there.  I looked through these notes.  She had some acute diastolic dysfunction related to this.  She was placed on anticoagulation and had TEE cardioversion but it was unsuccessful.  She was actually placed on sotalol as well.  It looks like she was discharged in fibrillation but at some point went back into sinus rhythm and seems to have been maintaining sinus rhythm since then.  She does not feel any of the chest discomfort or shortness of breath that she was having.  She is not having any new palpitations, presyncope or syncope.  She does have some puffiness in her feet and hands and takes some diuretic but she is not had any acute PND or orthopnea.  She is getting ready to have back surgery and needs preop clearance.  Also she has been diagnosed with sleep apnea and is awaiting a CPAP machine.   Past Medical History:  Diagnosis Date   Agatston coronary artery calcium score greater than 400    Arrhythmia    atrial fibrillation   CHF (congestive heart  failure) ([place])    Diabetes mellitus ([place])    x 3 years   DJD (degenerative joint disease)    Encephalitis    Fibromyalgia    HTN (hypertension)    Hyperlipidemia    Sleep apnea    No CPAP    Past Surgical History:  Procedure Laterality Date   APPENDECTOMY     BREAST CYST EXCISION     BREAST EXCISIONAL BIOPSY Left 2004   COLONOSCOPY WITH PROPOFOL N/A 11/26/2020   Procedure: COLONOSCOPY WITH PROPOFOL;  Surgeon: Carol Ada, MD;  Location: [facility];  Service: Endoscopy;  Laterality: N/A;   HEMOSTASIS CLIP PLACEMENT  11/26/2020   Procedure: HEMOSTASIS CLIP PLACEMENT;  Surgeon: Carol Ada, MD;  Location: [facility];  Service: Endoscopy;;   KNEE ARTHROSCOPY     POLYPECTOMY  11/26/2020   Procedure: POLYPECTOMY;  Surgeon: Carol Ada, MD;  Location: [facility];  Service: Endoscopy;;   SUBMUCOSAL TATTOO INJECTION  11/26/2020   Procedure: SUBMUCOSAL TATTOO INJECTION;  Surgeon: Carol Ada, MD;  Location: [facility];  Service: Endoscopy;;   TEE WITHOUT CARDIOVERSION N/A 06/29/2020   Procedure: TRANSESOPHAGEAL ECHOCARDIOGRAM (TEE) with DCCV;  Surgeon: Dionisio David, MD;  Location: [facility] ORS;  Service: Cardiovascular;  Laterality: N/A;     Current Outpatient Medications  Medication Sig Dispense Refill   apixaban (ELIQUIS) 5 MG TABS tablet  Take 1 tablet (5 mg total) by mouth 2 (two) times daily. 60 tablet 0   beta carotene w/minerals (OCUVITE) tablet Take 1 tablet by mouth daily.     diltiazem (CARDIZEM CD) 240 MG 24 hr capsule Take 1 capsule (240 mg total) by mouth daily. 30 capsule 0   docusate sodium (COLACE) 100 MG capsule Take 100 mg by mouth daily as needed for mild constipation.     DULoxetine (CYMBALTA) 60 MG capsule Take 60 mg by mouth daily.     EPINEPHrine 0.3 mg/0.3 mL IJ SOAJ injection Inject 0.3 mg into the muscle as needed for anaphylaxis.     Evolocumab (REPATHA SURECLICK) 267 MG/ML SOAJ Inject 140 mg into the skin every 14 (fourteen) days.     Exenatide ER  (BYDUREON) 2 MG PEN Inject 2 mg into the skin every Tuesday.     fesoterodine (TOVIAZ) 4 MG TB24 tablet Take 4 mg by mouth daily.     furosemide (LASIX) 20 MG tablet Take 1 tablet (20 mg total) by mouth 2 (two) times daily. (Patient taking differently: Take 20 mg by mouth daily.) 30 tablet 1   metFORMIN (GLUCOPHAGE) 1000 MG tablet Take 1,000 mg by mouth daily with breakfast.      sotalol (BETAPACE) 80 MG tablet Take 1 tablet (80 mg total) by mouth every 12 (twelve) hours. 60 tablet 0   valACYclovir (VALTREX) 1000 MG tablet Take 1,000 mg by mouth daily.     No current facility-administered medications for this visit.    Allergies:   Betadine [povidone iodine], Contrast media [iodinated diagnostic agents], Iodine, Metrizamide, Povidone-iodine, Shellfish allergy, and Hydrocodone-acetaminophen    ROS:  Please see the history of present illness.   Otherwise, review of systems are positive for none.   All other systems are reviewed and negative.    PHYSICAL EXAM: VS:  BP 140/80 (BP Location: Left Arm, Patient Position: Sitting, Cuff Size: Normal)   Pulse 79   Resp 18   Ht 5' (1.524 m)   Wt 246 lb (111.6 kg)   SpO2 97%   BMI 48.04 kg/m  , BMI Body mass index is 48.04 kg/m. GENERAL:  Well appearing NECK:  No jugular venous distention, waveform within normal limits, carotid upstroke brisk and symmetric, no bruits, no thyromegaly LUNGS:  Clear to auscultation bilaterally CHEST:  Unremarkable HEART:  PMI not displaced or sustained,S1 and S2 within normal limits, no S3, no S4, no clicks, no rubs, no murmurs ABD:  Flat, positive bowel sounds normal in frequency in pitch, no bruits, no rebound, no guarding, no midline pulsatile mass, no hepatomegaly, no splenomegaly EXT:  2 plus pulses throughout, trace edema, no cyanosis no clubbing, varicose veins   EKG:  EKG is  ordered today. The ekg ordered today demonstrates sinus rhythm, rate 79, axis within normal limits, intervals within normal limits,  no acute ST-T wave changes.   Recent Labs: 06/17/2020: TSH 1.454 06/28/2020: ALT 44; B Natriuretic Peptide 1,131.8 06/29/2020: Hemoglobin 14.1; Platelets 451 06/30/2020: BUN 25; Creatinine, Ser 0.81; Magnesium 1.9; Potassium 3.0; Sodium 141      Wt Readings from Last 3 Encounters:  12/31/20 246 lb (111.6 kg)  11/23/20 230 lb (104.3 kg)  10/06/20 228 lb 12.8 oz (103.8 kg)      Other studies Reviewed: Additional studies/ records that were reviewed today include: Labs, extensive review of [place] records Review of the above records demonstrates:  Please see elsewhere in the note.     ASSESSMENT AND PLAN:  ATRIAL  FIB: She seems to be maintaining sinus rhythm.  I will check a CBC today and also basic metabolic profile.  Otherwise no change in therapy.  CORONARY CALCIUM:      She had a negative POET.   She has had no symptoms since then.  We will continue with risk reduction.  DYSLIPIDEMIA: Her LDL most recently was 113.  She has not tolerated Zetia or statins.  She has seen Dr Debara Pickett.  I will send him a message to see if there is any other suggestions.   FAITUGE:    She has diagnosed with sleep apnea we will see how this responds when she gets her mask.  She is on the waiting list.   PREOP: The patient has no high risk features for the planned lumbar surgery.  She has no need for repeat cardiovascular testing according to ACC/AHA guidelines.  She can hold her Eliquis for 3 days prior.     Current medicines are reviewed at length with the patient today.  The patient does not have concerns regarding medicines.  The following changes have been made:  None Labs/ tests ordered today include:  None  Orders Placed This Encounter  Procedures   CBC   Basic metabolic panel   EKG [id number]      Disposition:   FU with APP in 6 months   Signed, [name], MD  12/31/2020 9:48 AM    [place]

## 2020-12-31 ENCOUNTER — Encounter: Payer: Self-pay | Admitting: Cardiology

## 2020-12-31 ENCOUNTER — Ambulatory Visit (INDEPENDENT_AMBULATORY_CARE_PROVIDER_SITE_OTHER): Payer: Medicare Other | Admitting: Cardiology

## 2020-12-31 ENCOUNTER — Other Ambulatory Visit: Payer: Self-pay

## 2020-12-31 VITALS — BP 140/80 | HR 79 | Resp 18 | Ht 60.0 in | Wt 246.0 lb

## 2020-12-31 DIAGNOSIS — M4726 Other spondylosis with radiculopathy, lumbar region: Secondary | ICD-10-CM | POA: Diagnosis not present

## 2020-12-31 DIAGNOSIS — E785 Hyperlipidemia, unspecified: Secondary | ICD-10-CM | POA: Diagnosis not present

## 2020-12-31 DIAGNOSIS — R931 Abnormal findings on diagnostic imaging of heart and coronary circulation: Secondary | ICD-10-CM | POA: Diagnosis not present

## 2020-12-31 DIAGNOSIS — M5432 Sciatica, left side: Secondary | ICD-10-CM | POA: Diagnosis not present

## 2020-12-31 DIAGNOSIS — M48062 Spinal stenosis, lumbar region with neurogenic claudication: Secondary | ICD-10-CM | POA: Diagnosis not present

## 2020-12-31 DIAGNOSIS — R5383 Other fatigue: Secondary | ICD-10-CM

## 2020-12-31 DIAGNOSIS — M4316 Spondylolisthesis, lumbar region: Secondary | ICD-10-CM | POA: Diagnosis not present

## 2020-12-31 LAB — CBC
Hematocrit: 43.4 % (ref 34.0–46.6)
Hemoglobin: 14.3 g/dL (ref 11.1–15.9)
MCH: 29.1 pg (ref 26.6–33.0)
MCHC: 32.9 g/dL (ref 31.5–35.7)
MCV: 88 fL (ref 79–97)
Platelets: 284 10*3/uL (ref 150–450)
RBC: 4.92 x10E6/uL (ref 3.77–5.28)
RDW: 13.9 % (ref 11.7–15.4)
WBC: 9.1 10*3/uL (ref 3.4–10.8)

## 2020-12-31 LAB — BASIC METABOLIC PANEL
BUN/Creatinine Ratio: 23 (ref 12–28)
BUN: 16 mg/dL (ref 8–27)
CO2: 25 mmol/L (ref 20–29)
Calcium: 9.8 mg/dL (ref 8.7–10.3)
Chloride: 98 mmol/L (ref 96–106)
Creatinine, Ser: 0.71 mg/dL (ref 0.57–1.00)
Glucose: 92 mg/dL (ref 65–99)
Potassium: 4.6 mmol/L (ref 3.5–5.2)
Sodium: 139 mmol/L (ref 134–144)
eGFR: 92 mL/min/{1.73_m2} (ref >59)

## 2020-12-31 NOTE — Patient Instructions (Addendum)
Medication Instructions:  Your physician recommends that you continue on your current medications as directed. Please refer to the Current Medication list given to you today.  *If you need a refill on your cardiac medications before your next appointment, please call your pharmacy*   Lab Work: BMET, CBC to be drawn today.    Testing/Procedures: None ordered.    Follow-Up: At [facility], you and your health needs are our priority.  As part of our continuing mission to provide you with exceptional heart care, we have created designated Provider Care Teams.  These Care Teams include your primary Cardiologist (physician) and Advanced Practice Providers (APPs -  Physician Assistants and Nurse Practitioners) who all work together to provide you with the care you need, when you need it.  We recommend signing up for the patient portal called "MyChart".  Sign up information is provided on this After Visit Summary.  MyChart is used to connect with patients for Virtual Visits (Telemedicine).  Patients are able to view lab/test results, encounter notes, upcoming appointments, etc.  Non-urgent messages can be sent to your provider as well.   To learn more about what you can do with MyChart, go to [email or web address].    Your next appointment:   6 month(s)  The format for your next appointment:   In Person  Provider:   With office PA

## 2021-01-05 ENCOUNTER — Ambulatory Visit: Payer: Medicare Other | Admitting: Orthopaedic Surgery

## 2021-01-06 ENCOUNTER — Telehealth: Payer: Self-pay | Admitting: *Deleted

## 2021-01-06 NOTE — Telephone Encounter (Signed)
   [facility] Pre-operative Risk Assessment    Patient Name: Tammy [name]  DOB: 1950/08/03  MRN: [id number]   HEARTCARE STAFF: - Please ensure there is not already an duplicate clearance open for this procedure. - Under Visit Info/Reason for Call, type in Other and utilize the format Clearance MM/DD/YY or Clearance TBD. Do not use dashes or single digits. - If request is for dental extraction, please clarify the # of teeth to be extracted. - If the patient is currently at the dentist's office, call Pre-Op APP to address. If the patient is not currently in the dentist office, please route to the Pre-Op pool  Request for surgical clearance:  What type of surgery is being performed? L3-4 L4-5 DECOMPFUSION-4 HOURS CASE, OVERNIGHT STAY   When is this surgery scheduled? TBD   What type of clearance is required (medical clearance vs. Pharmacy clearance to hold med vs. Both)? BOTH  Are there any medications that need to be held prior to surgery and how long?ELIQUIS FOR 3 DAYS   Practice name and name of physician performing surgery? Ivan Croft MD   What is the office phone number? [phone]   7.   What is the office fax number? [phone]  8.   Anesthesia type (None, local, MAC, general) ? GENERAL   Fredia Beets 01/06/2021, 7:27 AM  _________________________________________________________________   (provider comments below)

## 2021-01-06 NOTE — Telephone Encounter (Signed)
I faxed Dr. Rosezella [place] note that included clearance and [place] hold.

## 2021-01-24 DIAGNOSIS — I480 Paroxysmal atrial fibrillation: Secondary | ICD-10-CM | POA: Diagnosis not present

## 2021-01-24 DIAGNOSIS — E1129 Type 2 diabetes mellitus with other diabetic kidney complication: Secondary | ICD-10-CM | POA: Diagnosis not present

## 2021-01-24 DIAGNOSIS — I2510 Atherosclerotic heart disease of native coronary artery without angina pectoris: Secondary | ICD-10-CM | POA: Diagnosis not present

## 2021-01-24 DIAGNOSIS — M5136 Other intervertebral disc degeneration, lumbar region: Secondary | ICD-10-CM | POA: Diagnosis not present

## 2021-01-24 DIAGNOSIS — E1149 Type 2 diabetes mellitus with other diabetic neurological complication: Secondary | ICD-10-CM | POA: Diagnosis not present

## 2021-01-24 DIAGNOSIS — I700 Atherosclerosis of aorta: Secondary | ICD-10-CM | POA: Diagnosis not present

## 2021-01-24 DIAGNOSIS — D6869 Other thrombophilia: Secondary | ICD-10-CM | POA: Diagnosis not present

## 2021-01-24 DIAGNOSIS — I6523 Occlusion and stenosis of bilateral carotid arteries: Secondary | ICD-10-CM | POA: Diagnosis not present

## 2021-01-24 DIAGNOSIS — I10 Essential (primary) hypertension: Secondary | ICD-10-CM | POA: Diagnosis not present

## 2021-01-24 DIAGNOSIS — G729 Myopathy, unspecified: Secondary | ICD-10-CM | POA: Diagnosis not present

## 2021-01-24 DIAGNOSIS — E785 Hyperlipidemia, unspecified: Secondary | ICD-10-CM | POA: Diagnosis not present

## 2021-01-28 DIAGNOSIS — M4316 Spondylolisthesis, lumbar region: Secondary | ICD-10-CM | POA: Diagnosis not present

## 2021-01-28 DIAGNOSIS — M4726 Other spondylosis with radiculopathy, lumbar region: Secondary | ICD-10-CM | POA: Diagnosis not present

## 2021-01-28 DIAGNOSIS — M5432 Sciatica, left side: Secondary | ICD-10-CM | POA: Diagnosis not present

## 2021-01-28 DIAGNOSIS — M48062 Spinal stenosis, lumbar region with neurogenic claudication: Secondary | ICD-10-CM | POA: Diagnosis not present

## 2021-02-02 DIAGNOSIS — M48061 Spinal stenosis, lumbar region without neurogenic claudication: Secondary | ICD-10-CM | POA: Diagnosis not present

## 2021-02-02 DIAGNOSIS — Z01812 Encounter for preprocedural laboratory examination: Secondary | ICD-10-CM | POA: Diagnosis not present

## 2021-02-02 DIAGNOSIS — M4316 Spondylolisthesis, lumbar region: Secondary | ICD-10-CM | POA: Diagnosis not present

## 2021-02-02 DIAGNOSIS — M5430 Sciatica, unspecified side: Secondary | ICD-10-CM | POA: Diagnosis not present

## 2021-02-02 DIAGNOSIS — M5416 Radiculopathy, lumbar region: Secondary | ICD-10-CM | POA: Diagnosis not present

## 2021-02-07 DIAGNOSIS — M4726 Other spondylosis with radiculopathy, lumbar region: Secondary | ICD-10-CM | POA: Diagnosis not present

## 2021-02-07 DIAGNOSIS — M48062 Spinal stenosis, lumbar region with neurogenic claudication: Secondary | ICD-10-CM | POA: Diagnosis not present

## 2021-02-07 DIAGNOSIS — G4730 Sleep apnea, unspecified: Secondary | ICD-10-CM | POA: Diagnosis not present

## 2021-02-07 DIAGNOSIS — I4891 Unspecified atrial fibrillation: Secondary | ICD-10-CM | POA: Diagnosis not present

## 2021-02-07 DIAGNOSIS — M4316 Spondylolisthesis, lumbar region: Secondary | ICD-10-CM | POA: Diagnosis not present

## 2021-02-07 DIAGNOSIS — E119 Type 2 diabetes mellitus without complications: Secondary | ICD-10-CM | POA: Diagnosis not present

## 2021-02-07 DIAGNOSIS — Z4889 Encounter for other specified surgical aftercare: Secondary | ICD-10-CM | POA: Diagnosis not present

## 2021-02-07 DIAGNOSIS — Z7901 Long term (current) use of anticoagulants: Secondary | ICD-10-CM | POA: Diagnosis not present

## 2021-02-07 DIAGNOSIS — M47816 Spondylosis without myelopathy or radiculopathy, lumbar region: Secondary | ICD-10-CM | POA: Diagnosis not present

## 2021-02-07 DIAGNOSIS — M5432 Sciatica, left side: Secondary | ICD-10-CM | POA: Diagnosis not present

## 2021-02-07 DIAGNOSIS — Z7409 Other reduced mobility: Secondary | ICD-10-CM | POA: Diagnosis not present

## 2021-02-07 DIAGNOSIS — Z789 Other specified health status: Secondary | ICD-10-CM | POA: Diagnosis not present

## 2021-02-08 DIAGNOSIS — I4891 Unspecified atrial fibrillation: Secondary | ICD-10-CM | POA: Diagnosis not present

## 2021-02-08 DIAGNOSIS — M47816 Spondylosis without myelopathy or radiculopathy, lumbar region: Secondary | ICD-10-CM | POA: Diagnosis not present

## 2021-02-08 DIAGNOSIS — M4316 Spondylolisthesis, lumbar region: Secondary | ICD-10-CM | POA: Diagnosis not present

## 2021-02-08 DIAGNOSIS — M4726 Other spondylosis with radiculopathy, lumbar region: Secondary | ICD-10-CM | POA: Diagnosis not present

## 2021-02-08 DIAGNOSIS — M5432 Sciatica, left side: Secondary | ICD-10-CM | POA: Diagnosis not present

## 2021-02-08 DIAGNOSIS — M4326 Fusion of spine, lumbar region: Secondary | ICD-10-CM | POA: Diagnosis not present

## 2021-02-08 DIAGNOSIS — M48062 Spinal stenosis, lumbar region with neurogenic claudication: Secondary | ICD-10-CM | POA: Diagnosis not present

## 2021-02-08 DIAGNOSIS — Z9889 Other specified postprocedural states: Secondary | ICD-10-CM | POA: Diagnosis not present

## 2021-02-08 DIAGNOSIS — Z7409 Other reduced mobility: Secondary | ICD-10-CM | POA: Diagnosis not present

## 2021-02-08 DIAGNOSIS — Z981 Arthrodesis status: Secondary | ICD-10-CM | POA: Diagnosis not present

## 2021-02-09 DIAGNOSIS — M4726 Other spondylosis with radiculopathy, lumbar region: Secondary | ICD-10-CM | POA: Diagnosis not present

## 2021-02-09 DIAGNOSIS — I4891 Unspecified atrial fibrillation: Secondary | ICD-10-CM | POA: Diagnosis not present

## 2021-02-09 DIAGNOSIS — M4316 Spondylolisthesis, lumbar region: Secondary | ICD-10-CM | POA: Diagnosis not present

## 2021-02-09 DIAGNOSIS — Z7409 Other reduced mobility: Secondary | ICD-10-CM | POA: Diagnosis not present

## 2021-02-09 DIAGNOSIS — M48062 Spinal stenosis, lumbar region with neurogenic claudication: Secondary | ICD-10-CM | POA: Diagnosis not present

## 2021-02-09 DIAGNOSIS — M5432 Sciatica, left side: Secondary | ICD-10-CM | POA: Diagnosis not present

## 2021-02-11 DIAGNOSIS — Z7901 Long term (current) use of anticoagulants: Secondary | ICD-10-CM | POA: Diagnosis not present

## 2021-02-11 DIAGNOSIS — E119 Type 2 diabetes mellitus without complications: Secondary | ICD-10-CM | POA: Diagnosis not present

## 2021-02-11 DIAGNOSIS — Z79891 Long term (current) use of opiate analgesic: Secondary | ICD-10-CM | POA: Diagnosis not present

## 2021-02-11 DIAGNOSIS — E785 Hyperlipidemia, unspecified: Secondary | ICD-10-CM | POA: Diagnosis not present

## 2021-02-11 DIAGNOSIS — Z7982 Long term (current) use of aspirin: Secondary | ICD-10-CM | POA: Diagnosis not present

## 2021-02-11 DIAGNOSIS — M1990 Unspecified osteoarthritis, unspecified site: Secondary | ICD-10-CM | POA: Diagnosis not present

## 2021-02-11 DIAGNOSIS — I2510 Atherosclerotic heart disease of native coronary artery without angina pectoris: Secondary | ICD-10-CM | POA: Diagnosis not present

## 2021-02-11 DIAGNOSIS — G5600 Carpal tunnel syndrome, unspecified upper limb: Secondary | ICD-10-CM | POA: Diagnosis not present

## 2021-02-11 DIAGNOSIS — G4733 Obstructive sleep apnea (adult) (pediatric): Secondary | ICD-10-CM | POA: Diagnosis not present

## 2021-02-11 DIAGNOSIS — H9193 Unspecified hearing loss, bilateral: Secondary | ICD-10-CM | POA: Diagnosis not present

## 2021-02-11 DIAGNOSIS — Z981 Arthrodesis status: Secondary | ICD-10-CM | POA: Diagnosis not present

## 2021-02-11 DIAGNOSIS — Z9181 History of falling: Secondary | ICD-10-CM | POA: Diagnosis not present

## 2021-02-11 DIAGNOSIS — M797 Fibromyalgia: Secondary | ICD-10-CM | POA: Diagnosis not present

## 2021-02-11 DIAGNOSIS — I119 Hypertensive heart disease without heart failure: Secondary | ICD-10-CM | POA: Diagnosis not present

## 2021-02-11 DIAGNOSIS — Z7984 Long term (current) use of oral hypoglycemic drugs: Secondary | ICD-10-CM | POA: Diagnosis not present

## 2021-02-11 DIAGNOSIS — J449 Chronic obstructive pulmonary disease, unspecified: Secondary | ICD-10-CM | POA: Diagnosis not present

## 2021-02-11 DIAGNOSIS — M48062 Spinal stenosis, lumbar region with neurogenic claudication: Secondary | ICD-10-CM | POA: Diagnosis not present

## 2021-02-11 DIAGNOSIS — M5432 Sciatica, left side: Secondary | ICD-10-CM | POA: Diagnosis not present

## 2021-02-11 DIAGNOSIS — M4726 Other spondylosis with radiculopathy, lumbar region: Secondary | ICD-10-CM | POA: Diagnosis not present

## 2021-02-11 DIAGNOSIS — Z4789 Encounter for other orthopedic aftercare: Secondary | ICD-10-CM | POA: Diagnosis not present

## 2021-02-11 DIAGNOSIS — M4316 Spondylolisthesis, lumbar region: Secondary | ICD-10-CM | POA: Diagnosis not present

## 2021-02-14 DIAGNOSIS — M4726 Other spondylosis with radiculopathy, lumbar region: Secondary | ICD-10-CM | POA: Diagnosis not present

## 2021-02-14 DIAGNOSIS — M4316 Spondylolisthesis, lumbar region: Secondary | ICD-10-CM | POA: Diagnosis not present

## 2021-02-14 DIAGNOSIS — M48062 Spinal stenosis, lumbar region with neurogenic claudication: Secondary | ICD-10-CM | POA: Diagnosis not present

## 2021-02-14 DIAGNOSIS — M5432 Sciatica, left side: Secondary | ICD-10-CM | POA: Diagnosis not present

## 2021-02-14 DIAGNOSIS — Z4789 Encounter for other orthopedic aftercare: Secondary | ICD-10-CM | POA: Diagnosis not present

## 2021-02-14 DIAGNOSIS — E119 Type 2 diabetes mellitus without complications: Secondary | ICD-10-CM | POA: Diagnosis not present

## 2021-02-15 DIAGNOSIS — M48062 Spinal stenosis, lumbar region with neurogenic claudication: Secondary | ICD-10-CM | POA: Diagnosis not present

## 2021-02-15 DIAGNOSIS — M4726 Other spondylosis with radiculopathy, lumbar region: Secondary | ICD-10-CM | POA: Diagnosis not present

## 2021-02-15 DIAGNOSIS — M4316 Spondylolisthesis, lumbar region: Secondary | ICD-10-CM | POA: Diagnosis not present

## 2021-02-15 DIAGNOSIS — M5432 Sciatica, left side: Secondary | ICD-10-CM | POA: Diagnosis not present

## 2021-02-15 DIAGNOSIS — Z4789 Encounter for other orthopedic aftercare: Secondary | ICD-10-CM | POA: Diagnosis not present

## 2021-02-15 DIAGNOSIS — E119 Type 2 diabetes mellitus without complications: Secondary | ICD-10-CM | POA: Diagnosis not present

## 2021-02-16 DIAGNOSIS — E119 Type 2 diabetes mellitus without complications: Secondary | ICD-10-CM | POA: Diagnosis not present

## 2021-02-16 DIAGNOSIS — M4316 Spondylolisthesis, lumbar region: Secondary | ICD-10-CM | POA: Diagnosis not present

## 2021-02-16 DIAGNOSIS — M5432 Sciatica, left side: Secondary | ICD-10-CM | POA: Diagnosis not present

## 2021-02-16 DIAGNOSIS — M4726 Other spondylosis with radiculopathy, lumbar region: Secondary | ICD-10-CM | POA: Diagnosis not present

## 2021-02-16 DIAGNOSIS — M48062 Spinal stenosis, lumbar region with neurogenic claudication: Secondary | ICD-10-CM | POA: Diagnosis not present

## 2021-02-16 DIAGNOSIS — Z4789 Encounter for other orthopedic aftercare: Secondary | ICD-10-CM | POA: Diagnosis not present

## 2021-02-17 DIAGNOSIS — M5432 Sciatica, left side: Secondary | ICD-10-CM | POA: Diagnosis not present

## 2021-02-17 DIAGNOSIS — E119 Type 2 diabetes mellitus without complications: Secondary | ICD-10-CM | POA: Diagnosis not present

## 2021-02-17 DIAGNOSIS — Z4789 Encounter for other orthopedic aftercare: Secondary | ICD-10-CM | POA: Diagnosis not present

## 2021-02-17 DIAGNOSIS — M48062 Spinal stenosis, lumbar region with neurogenic claudication: Secondary | ICD-10-CM | POA: Diagnosis not present

## 2021-02-17 DIAGNOSIS — M4726 Other spondylosis with radiculopathy, lumbar region: Secondary | ICD-10-CM | POA: Diagnosis not present

## 2021-02-17 DIAGNOSIS — M4316 Spondylolisthesis, lumbar region: Secondary | ICD-10-CM | POA: Diagnosis not present

## 2021-02-21 DIAGNOSIS — M48062 Spinal stenosis, lumbar region with neurogenic claudication: Secondary | ICD-10-CM | POA: Diagnosis not present

## 2021-02-21 DIAGNOSIS — M5432 Sciatica, left side: Secondary | ICD-10-CM | POA: Diagnosis not present

## 2021-02-21 DIAGNOSIS — M4316 Spondylolisthesis, lumbar region: Secondary | ICD-10-CM | POA: Diagnosis not present

## 2021-02-21 DIAGNOSIS — E119 Type 2 diabetes mellitus without complications: Secondary | ICD-10-CM | POA: Diagnosis not present

## 2021-02-21 DIAGNOSIS — M4726 Other spondylosis with radiculopathy, lumbar region: Secondary | ICD-10-CM | POA: Diagnosis not present

## 2021-02-21 DIAGNOSIS — Z4789 Encounter for other orthopedic aftercare: Secondary | ICD-10-CM | POA: Diagnosis not present

## 2021-02-22 DIAGNOSIS — M4316 Spondylolisthesis, lumbar region: Secondary | ICD-10-CM | POA: Diagnosis not present

## 2021-02-22 DIAGNOSIS — M48062 Spinal stenosis, lumbar region with neurogenic claudication: Secondary | ICD-10-CM | POA: Diagnosis not present

## 2021-02-22 DIAGNOSIS — M4726 Other spondylosis with radiculopathy, lumbar region: Secondary | ICD-10-CM | POA: Diagnosis not present

## 2021-02-22 DIAGNOSIS — Z4789 Encounter for other orthopedic aftercare: Secondary | ICD-10-CM | POA: Diagnosis not present

## 2021-02-22 DIAGNOSIS — M5432 Sciatica, left side: Secondary | ICD-10-CM | POA: Diagnosis not present

## 2021-02-22 DIAGNOSIS — E119 Type 2 diabetes mellitus without complications: Secondary | ICD-10-CM | POA: Diagnosis not present

## 2021-02-24 DIAGNOSIS — Z4789 Encounter for other orthopedic aftercare: Secondary | ICD-10-CM | POA: Diagnosis not present

## 2021-02-24 DIAGNOSIS — M4316 Spondylolisthesis, lumbar region: Secondary | ICD-10-CM | POA: Diagnosis not present

## 2021-02-24 DIAGNOSIS — M48062 Spinal stenosis, lumbar region with neurogenic claudication: Secondary | ICD-10-CM | POA: Diagnosis not present

## 2021-02-24 DIAGNOSIS — M5432 Sciatica, left side: Secondary | ICD-10-CM | POA: Diagnosis not present

## 2021-02-24 DIAGNOSIS — M4726 Other spondylosis with radiculopathy, lumbar region: Secondary | ICD-10-CM | POA: Diagnosis not present

## 2021-02-24 DIAGNOSIS — E119 Type 2 diabetes mellitus without complications: Secondary | ICD-10-CM | POA: Diagnosis not present

## 2021-02-28 DIAGNOSIS — E119 Type 2 diabetes mellitus without complications: Secondary | ICD-10-CM | POA: Diagnosis not present

## 2021-02-28 DIAGNOSIS — M48062 Spinal stenosis, lumbar region with neurogenic claudication: Secondary | ICD-10-CM | POA: Diagnosis not present

## 2021-02-28 DIAGNOSIS — Z4789 Encounter for other orthopedic aftercare: Secondary | ICD-10-CM | POA: Diagnosis not present

## 2021-02-28 DIAGNOSIS — M5432 Sciatica, left side: Secondary | ICD-10-CM | POA: Diagnosis not present

## 2021-02-28 DIAGNOSIS — M4726 Other spondylosis with radiculopathy, lumbar region: Secondary | ICD-10-CM | POA: Diagnosis not present

## 2021-02-28 DIAGNOSIS — M4316 Spondylolisthesis, lumbar region: Secondary | ICD-10-CM | POA: Diagnosis not present

## 2021-03-07 DIAGNOSIS — M4726 Other spondylosis with radiculopathy, lumbar region: Secondary | ICD-10-CM | POA: Diagnosis not present

## 2021-03-07 DIAGNOSIS — Z4789 Encounter for other orthopedic aftercare: Secondary | ICD-10-CM | POA: Diagnosis not present

## 2021-03-07 DIAGNOSIS — M48062 Spinal stenosis, lumbar region with neurogenic claudication: Secondary | ICD-10-CM | POA: Diagnosis not present

## 2021-03-07 DIAGNOSIS — M5432 Sciatica, left side: Secondary | ICD-10-CM | POA: Diagnosis not present

## 2021-03-07 DIAGNOSIS — E119 Type 2 diabetes mellitus without complications: Secondary | ICD-10-CM | POA: Diagnosis not present

## 2021-03-07 DIAGNOSIS — M4316 Spondylolisthesis, lumbar region: Secondary | ICD-10-CM | POA: Diagnosis not present

## 2021-03-08 DIAGNOSIS — M4726 Other spondylosis with radiculopathy, lumbar region: Secondary | ICD-10-CM | POA: Diagnosis not present

## 2021-03-08 DIAGNOSIS — M4326 Fusion of spine, lumbar region: Secondary | ICD-10-CM | POA: Diagnosis not present

## 2021-03-08 DIAGNOSIS — Z981 Arthrodesis status: Secondary | ICD-10-CM | POA: Diagnosis not present

## 2021-03-13 DIAGNOSIS — Z7901 Long term (current) use of anticoagulants: Secondary | ICD-10-CM | POA: Diagnosis not present

## 2021-03-13 DIAGNOSIS — Z7984 Long term (current) use of oral hypoglycemic drugs: Secondary | ICD-10-CM | POA: Diagnosis not present

## 2021-03-13 DIAGNOSIS — I2510 Atherosclerotic heart disease of native coronary artery without angina pectoris: Secondary | ICD-10-CM | POA: Diagnosis not present

## 2021-03-13 DIAGNOSIS — Z7982 Long term (current) use of aspirin: Secondary | ICD-10-CM | POA: Diagnosis not present

## 2021-03-13 DIAGNOSIS — M4316 Spondylolisthesis, lumbar region: Secondary | ICD-10-CM | POA: Diagnosis not present

## 2021-03-13 DIAGNOSIS — Z9181 History of falling: Secondary | ICD-10-CM | POA: Diagnosis not present

## 2021-03-13 DIAGNOSIS — Z4789 Encounter for other orthopedic aftercare: Secondary | ICD-10-CM | POA: Diagnosis not present

## 2021-03-13 DIAGNOSIS — Z981 Arthrodesis status: Secondary | ICD-10-CM | POA: Diagnosis not present

## 2021-03-13 DIAGNOSIS — M797 Fibromyalgia: Secondary | ICD-10-CM | POA: Diagnosis not present

## 2021-03-13 DIAGNOSIS — E119 Type 2 diabetes mellitus without complications: Secondary | ICD-10-CM | POA: Diagnosis not present

## 2021-03-13 DIAGNOSIS — H9193 Unspecified hearing loss, bilateral: Secondary | ICD-10-CM | POA: Diagnosis not present

## 2021-03-13 DIAGNOSIS — I119 Hypertensive heart disease without heart failure: Secondary | ICD-10-CM | POA: Diagnosis not present

## 2021-03-13 DIAGNOSIS — M5432 Sciatica, left side: Secondary | ICD-10-CM | POA: Diagnosis not present

## 2021-03-13 DIAGNOSIS — M1990 Unspecified osteoarthritis, unspecified site: Secondary | ICD-10-CM | POA: Diagnosis not present

## 2021-03-13 DIAGNOSIS — G4733 Obstructive sleep apnea (adult) (pediatric): Secondary | ICD-10-CM | POA: Diagnosis not present

## 2021-03-13 DIAGNOSIS — G5600 Carpal tunnel syndrome, unspecified upper limb: Secondary | ICD-10-CM | POA: Diagnosis not present

## 2021-03-13 DIAGNOSIS — M48062 Spinal stenosis, lumbar region with neurogenic claudication: Secondary | ICD-10-CM | POA: Diagnosis not present

## 2021-03-13 DIAGNOSIS — J449 Chronic obstructive pulmonary disease, unspecified: Secondary | ICD-10-CM | POA: Diagnosis not present

## 2021-03-13 DIAGNOSIS — E785 Hyperlipidemia, unspecified: Secondary | ICD-10-CM | POA: Diagnosis not present

## 2021-03-13 DIAGNOSIS — M4726 Other spondylosis with radiculopathy, lumbar region: Secondary | ICD-10-CM | POA: Diagnosis not present

## 2021-03-13 DIAGNOSIS — Z79891 Long term (current) use of opiate analgesic: Secondary | ICD-10-CM | POA: Diagnosis not present

## 2021-03-15 DIAGNOSIS — M4316 Spondylolisthesis, lumbar region: Secondary | ICD-10-CM | POA: Diagnosis not present

## 2021-03-15 DIAGNOSIS — M5432 Sciatica, left side: Secondary | ICD-10-CM | POA: Diagnosis not present

## 2021-03-15 DIAGNOSIS — M48062 Spinal stenosis, lumbar region with neurogenic claudication: Secondary | ICD-10-CM | POA: Diagnosis not present

## 2021-03-15 DIAGNOSIS — M4726 Other spondylosis with radiculopathy, lumbar region: Secondary | ICD-10-CM | POA: Diagnosis not present

## 2021-03-15 DIAGNOSIS — Z4789 Encounter for other orthopedic aftercare: Secondary | ICD-10-CM | POA: Diagnosis not present

## 2021-03-15 DIAGNOSIS — E119 Type 2 diabetes mellitus without complications: Secondary | ICD-10-CM | POA: Diagnosis not present

## 2021-03-18 DIAGNOSIS — G4730 Sleep apnea, unspecified: Secondary | ICD-10-CM | POA: Diagnosis not present

## 2021-03-18 DIAGNOSIS — R0602 Shortness of breath: Secondary | ICD-10-CM | POA: Diagnosis not present

## 2021-03-18 DIAGNOSIS — E782 Mixed hyperlipidemia: Secondary | ICD-10-CM | POA: Diagnosis not present

## 2021-03-18 DIAGNOSIS — R609 Edema, unspecified: Secondary | ICD-10-CM | POA: Diagnosis not present

## 2021-03-18 DIAGNOSIS — I10 Essential (primary) hypertension: Secondary | ICD-10-CM | POA: Diagnosis not present

## 2021-03-18 DIAGNOSIS — I4891 Unspecified atrial fibrillation: Secondary | ICD-10-CM | POA: Diagnosis not present

## 2021-03-23 DIAGNOSIS — E119 Type 2 diabetes mellitus without complications: Secondary | ICD-10-CM | POA: Diagnosis not present

## 2021-03-23 DIAGNOSIS — M4316 Spondylolisthesis, lumbar region: Secondary | ICD-10-CM | POA: Diagnosis not present

## 2021-03-23 DIAGNOSIS — Z4789 Encounter for other orthopedic aftercare: Secondary | ICD-10-CM | POA: Diagnosis not present

## 2021-03-23 DIAGNOSIS — M5432 Sciatica, left side: Secondary | ICD-10-CM | POA: Diagnosis not present

## 2021-03-23 DIAGNOSIS — M4726 Other spondylosis with radiculopathy, lumbar region: Secondary | ICD-10-CM | POA: Diagnosis not present

## 2021-03-23 DIAGNOSIS — M48062 Spinal stenosis, lumbar region with neurogenic claudication: Secondary | ICD-10-CM | POA: Diagnosis not present

## 2021-03-29 DIAGNOSIS — E1129 Type 2 diabetes mellitus with other diabetic kidney complication: Secondary | ICD-10-CM | POA: Diagnosis not present

## 2021-03-29 DIAGNOSIS — G729 Myopathy, unspecified: Secondary | ICD-10-CM | POA: Diagnosis not present

## 2021-03-29 DIAGNOSIS — I10 Essential (primary) hypertension: Secondary | ICD-10-CM | POA: Diagnosis not present

## 2021-03-29 DIAGNOSIS — I2510 Atherosclerotic heart disease of native coronary artery without angina pectoris: Secondary | ICD-10-CM | POA: Diagnosis not present

## 2021-03-31 ENCOUNTER — Other Ambulatory Visit: Payer: Self-pay | Admitting: *Deleted

## 2021-03-31 DIAGNOSIS — M48062 Spinal stenosis, lumbar region with neurogenic claudication: Secondary | ICD-10-CM | POA: Diagnosis not present

## 2021-03-31 DIAGNOSIS — E7849 Other hyperlipidemia: Secondary | ICD-10-CM

## 2021-03-31 DIAGNOSIS — T466X5A Adverse effect of antihyperlipidemic and antiarteriosclerotic drugs, initial encounter: Secondary | ICD-10-CM

## 2021-03-31 DIAGNOSIS — Z4789 Encounter for other orthopedic aftercare: Secondary | ICD-10-CM | POA: Diagnosis not present

## 2021-03-31 DIAGNOSIS — M4316 Spondylolisthesis, lumbar region: Secondary | ICD-10-CM | POA: Diagnosis not present

## 2021-03-31 DIAGNOSIS — M4726 Other spondylosis with radiculopathy, lumbar region: Secondary | ICD-10-CM | POA: Diagnosis not present

## 2021-04-06 DIAGNOSIS — I4891 Unspecified atrial fibrillation: Secondary | ICD-10-CM | POA: Diagnosis not present

## 2021-04-07 DIAGNOSIS — M25561 Pain in right knee: Secondary | ICD-10-CM | POA: Diagnosis not present

## 2021-04-07 DIAGNOSIS — M25562 Pain in left knee: Secondary | ICD-10-CM | POA: Diagnosis not present

## 2021-04-07 DIAGNOSIS — M170 Bilateral primary osteoarthritis of knee: Secondary | ICD-10-CM | POA: Diagnosis not present

## 2021-04-07 DIAGNOSIS — M1712 Unilateral primary osteoarthritis, left knee: Secondary | ICD-10-CM | POA: Diagnosis not present

## 2021-04-08 DIAGNOSIS — G4730 Sleep apnea, unspecified: Secondary | ICD-10-CM | POA: Diagnosis not present

## 2021-04-08 DIAGNOSIS — E782 Mixed hyperlipidemia: Secondary | ICD-10-CM | POA: Diagnosis not present

## 2021-04-08 DIAGNOSIS — E669 Obesity, unspecified: Secondary | ICD-10-CM | POA: Diagnosis not present

## 2021-04-08 DIAGNOSIS — I10 Essential (primary) hypertension: Secondary | ICD-10-CM | POA: Diagnosis not present

## 2021-04-08 DIAGNOSIS — I4891 Unspecified atrial fibrillation: Secondary | ICD-10-CM | POA: Diagnosis not present

## 2021-04-08 DIAGNOSIS — E668 Other obesity: Secondary | ICD-10-CM | POA: Diagnosis not present

## 2021-04-13 DIAGNOSIS — M25562 Pain in left knee: Secondary | ICD-10-CM | POA: Diagnosis not present

## 2021-04-13 DIAGNOSIS — E119 Type 2 diabetes mellitus without complications: Secondary | ICD-10-CM | POA: Diagnosis not present

## 2021-04-13 DIAGNOSIS — Z961 Presence of intraocular lens: Secondary | ICD-10-CM | POA: Diagnosis not present

## 2021-04-13 DIAGNOSIS — M1712 Unilateral primary osteoarthritis, left knee: Secondary | ICD-10-CM | POA: Diagnosis not present

## 2021-04-19 DIAGNOSIS — M1711 Unilateral primary osteoarthritis, right knee: Secondary | ICD-10-CM | POA: Diagnosis not present

## 2021-04-19 DIAGNOSIS — M25561 Pain in right knee: Secondary | ICD-10-CM | POA: Diagnosis not present

## 2021-04-21 DIAGNOSIS — M25562 Pain in left knee: Secondary | ICD-10-CM | POA: Diagnosis not present

## 2021-04-21 DIAGNOSIS — M1712 Unilateral primary osteoarthritis, left knee: Secondary | ICD-10-CM | POA: Diagnosis not present

## 2021-04-27 DIAGNOSIS — M1711 Unilateral primary osteoarthritis, right knee: Secondary | ICD-10-CM | POA: Diagnosis not present

## 2021-04-27 DIAGNOSIS — M25561 Pain in right knee: Secondary | ICD-10-CM | POA: Diagnosis not present

## 2021-04-28 DIAGNOSIS — M1712 Unilateral primary osteoarthritis, left knee: Secondary | ICD-10-CM | POA: Diagnosis not present

## 2021-04-28 DIAGNOSIS — M25562 Pain in left knee: Secondary | ICD-10-CM | POA: Diagnosis not present

## 2021-05-05 DIAGNOSIS — M1711 Unilateral primary osteoarthritis, right knee: Secondary | ICD-10-CM | POA: Diagnosis not present

## 2021-05-05 DIAGNOSIS — M25561 Pain in right knee: Secondary | ICD-10-CM | POA: Diagnosis not present

## 2021-05-05 DIAGNOSIS — M4326 Fusion of spine, lumbar region: Secondary | ICD-10-CM | POA: Diagnosis not present

## 2021-05-11 DIAGNOSIS — M1712 Unilateral primary osteoarthritis, left knee: Secondary | ICD-10-CM | POA: Diagnosis not present

## 2021-05-11 DIAGNOSIS — M25562 Pain in left knee: Secondary | ICD-10-CM | POA: Diagnosis not present

## 2021-05-12 DIAGNOSIS — M1711 Unilateral primary osteoarthritis, right knee: Secondary | ICD-10-CM | POA: Diagnosis not present

## 2021-05-12 DIAGNOSIS — M25561 Pain in right knee: Secondary | ICD-10-CM | POA: Diagnosis not present

## 2021-05-19 DIAGNOSIS — M25561 Pain in right knee: Secondary | ICD-10-CM | POA: Diagnosis not present

## 2021-05-19 DIAGNOSIS — M1711 Unilateral primary osteoarthritis, right knee: Secondary | ICD-10-CM | POA: Diagnosis not present

## 2021-05-23 DIAGNOSIS — G729 Myopathy, unspecified: Secondary | ICD-10-CM | POA: Diagnosis not present

## 2021-05-23 DIAGNOSIS — E1149 Type 2 diabetes mellitus with other diabetic neurological complication: Secondary | ICD-10-CM | POA: Diagnosis not present

## 2021-05-23 DIAGNOSIS — R5383 Other fatigue: Secondary | ICD-10-CM | POA: Diagnosis not present

## 2021-05-23 DIAGNOSIS — D6869 Other thrombophilia: Secondary | ICD-10-CM | POA: Diagnosis not present

## 2021-05-23 DIAGNOSIS — F17201 Nicotine dependence, unspecified, in remission: Secondary | ICD-10-CM | POA: Diagnosis not present

## 2021-05-23 DIAGNOSIS — M179 Osteoarthritis of knee, unspecified: Secondary | ICD-10-CM | POA: Diagnosis not present

## 2021-05-23 DIAGNOSIS — I6523 Occlusion and stenosis of bilateral carotid arteries: Secondary | ICD-10-CM | POA: Diagnosis not present

## 2021-05-23 DIAGNOSIS — I10 Essential (primary) hypertension: Secondary | ICD-10-CM | POA: Diagnosis not present

## 2021-05-23 DIAGNOSIS — E1129 Type 2 diabetes mellitus with other diabetic kidney complication: Secondary | ICD-10-CM | POA: Diagnosis not present

## 2021-05-23 DIAGNOSIS — E785 Hyperlipidemia, unspecified: Secondary | ICD-10-CM | POA: Diagnosis not present

## 2021-05-23 DIAGNOSIS — N3941 Urge incontinence: Secondary | ICD-10-CM | POA: Diagnosis not present

## 2021-05-23 DIAGNOSIS — G629 Polyneuropathy, unspecified: Secondary | ICD-10-CM | POA: Diagnosis not present

## 2021-05-23 DIAGNOSIS — I2510 Atherosclerotic heart disease of native coronary artery without angina pectoris: Secondary | ICD-10-CM | POA: Diagnosis not present

## 2021-05-23 DIAGNOSIS — Z23 Encounter for immunization: Secondary | ICD-10-CM | POA: Diagnosis not present

## 2021-05-23 DIAGNOSIS — I480 Paroxysmal atrial fibrillation: Secondary | ICD-10-CM | POA: Diagnosis not present

## 2021-05-23 DIAGNOSIS — I700 Atherosclerosis of aorta: Secondary | ICD-10-CM | POA: Diagnosis not present

## 2021-05-25 DIAGNOSIS — L570 Actinic keratosis: Secondary | ICD-10-CM | POA: Diagnosis not present

## 2021-05-25 DIAGNOSIS — D485 Neoplasm of uncertain behavior of skin: Secondary | ICD-10-CM | POA: Diagnosis not present

## 2021-05-25 DIAGNOSIS — D1801 Hemangioma of skin and subcutaneous tissue: Secondary | ICD-10-CM | POA: Diagnosis not present

## 2021-05-25 DIAGNOSIS — L821 Other seborrheic keratosis: Secondary | ICD-10-CM | POA: Diagnosis not present

## 2021-05-25 DIAGNOSIS — D2371 Other benign neoplasm of skin of right lower limb, including hip: Secondary | ICD-10-CM | POA: Diagnosis not present

## 2021-05-25 DIAGNOSIS — D3617 Benign neoplasm of peripheral nerves and autonomic nervous system of trunk, unspecified: Secondary | ICD-10-CM | POA: Diagnosis not present

## 2021-05-25 DIAGNOSIS — L814 Other melanin hyperpigmentation: Secondary | ICD-10-CM | POA: Diagnosis not present

## 2021-07-01 ENCOUNTER — Ambulatory Visit: Payer: Medicare Other | Admitting: Cardiology

## 2021-07-07 DIAGNOSIS — R002 Palpitations: Secondary | ICD-10-CM | POA: Diagnosis not present

## 2021-07-07 DIAGNOSIS — I4891 Unspecified atrial fibrillation: Secondary | ICD-10-CM | POA: Diagnosis not present

## 2021-07-07 DIAGNOSIS — E782 Mixed hyperlipidemia: Secondary | ICD-10-CM | POA: Diagnosis not present

## 2021-07-07 DIAGNOSIS — G4730 Sleep apnea, unspecified: Secondary | ICD-10-CM | POA: Diagnosis not present

## 2021-07-07 DIAGNOSIS — I340 Nonrheumatic mitral (valve) insufficiency: Secondary | ICD-10-CM | POA: Diagnosis not present

## 2021-07-07 DIAGNOSIS — I10 Essential (primary) hypertension: Secondary | ICD-10-CM | POA: Diagnosis not present

## 2021-07-07 DIAGNOSIS — E669 Obesity, unspecified: Secondary | ICD-10-CM | POA: Diagnosis not present

## 2021-07-08 ENCOUNTER — Encounter: Payer: Self-pay | Admitting: Emergency Medicine

## 2021-07-08 ENCOUNTER — Other Ambulatory Visit: Payer: Self-pay

## 2021-07-08 ENCOUNTER — Emergency Department: Payer: Medicare Other

## 2021-07-08 ENCOUNTER — Emergency Department
Admission: EM | Admit: 2021-07-08 | Discharge: 2021-07-08 | Disposition: A | Discharge status: Home / Self Care | Payer: Medicare Other | Attending: Emergency Medicine | Admitting: Emergency Medicine

## 2021-07-08 DIAGNOSIS — W07XXXA Fall from chair, initial encounter: Secondary | ICD-10-CM | POA: Insufficient documentation

## 2021-07-08 DIAGNOSIS — M545 Low back pain, unspecified: Secondary | ICD-10-CM | POA: Insufficient documentation

## 2021-07-08 DIAGNOSIS — Z7984 Long term (current) use of oral hypoglycemic drugs: Secondary | ICD-10-CM | POA: Insufficient documentation

## 2021-07-08 DIAGNOSIS — I110 Hypertensive heart disease with heart failure: Secondary | ICD-10-CM | POA: Insufficient documentation

## 2021-07-08 DIAGNOSIS — M4326 Fusion of spine, lumbar region: Secondary | ICD-10-CM | POA: Diagnosis not present

## 2021-07-08 DIAGNOSIS — M2578 Osteophyte, vertebrae: Secondary | ICD-10-CM | POA: Diagnosis not present

## 2021-07-08 DIAGNOSIS — E119 Type 2 diabetes mellitus without complications: Secondary | ICD-10-CM | POA: Insufficient documentation

## 2021-07-08 DIAGNOSIS — Z87891 Personal history of nicotine dependence: Secondary | ICD-10-CM | POA: Diagnosis not present

## 2021-07-08 DIAGNOSIS — I4891 Unspecified atrial fibrillation: Secondary | ICD-10-CM | POA: Diagnosis not present

## 2021-07-08 DIAGNOSIS — I5031 Acute diastolic (congestive) heart failure: Secondary | ICD-10-CM | POA: Insufficient documentation

## 2021-07-08 DIAGNOSIS — Z79899 Other long term (current) drug therapy: Secondary | ICD-10-CM | POA: Diagnosis not present

## 2021-07-08 DIAGNOSIS — M5459 Other low back pain: Secondary | ICD-10-CM | POA: Diagnosis not present

## 2021-07-08 DIAGNOSIS — Z7901 Long term (current) use of anticoagulants: Secondary | ICD-10-CM | POA: Insufficient documentation

## 2021-07-08 MED ORDER — OXYCODONE-ACETAMINOPHEN 5-325 MG PO TABS
1.0000 | ORAL_TABLET | Freq: Once | ORAL | Status: AC
Start: 2021-07-08 — End: 2021-07-08
  Administered 2021-07-08: 20:00:00 1 via ORAL
  Filled 2021-07-08: qty 1

## 2021-07-08 MED ORDER — ONDANSETRON 4 MG PO TBDP: 4.0000 mg | ORAL_TABLET | Freq: Three times a day (TID) | ORAL | 0 refills | Status: AC | PRN

## 2021-07-08 MED ORDER — HYDROCODONE-ACETAMINOPHEN 5-325 MG PO TABS: 1.0000 | ORAL_TABLET | Freq: Four times a day (QID) | ORAL | 0 refills | Status: AC | PRN

## 2021-07-08 MED ORDER — ONDANSETRON 4 MG PO TBDP
4.0000 mg | ORAL_TABLET | Freq: Once | ORAL | Status: AC
  Administered 2021-07-08: 20:00:00 4 mg via ORAL
  Filled 2021-07-08: qty 1

## 2021-07-08 NOTE — ED Provider Notes (Signed)
[facility]-EMERGENCY DEPARTMENT  ____________________________________________  Time seen: Approximately 8:05 PM  I have reviewed the triage vital signs and the nursing notes.   HISTORY  Chief Complaint Back Pain   Historian Patient     HPI Tammy [name] is a 70 y.o. female status post L3-L4 and L4-L5 decompression and fusion in July, presents to the emergency department with left-sided low back pain that has occurred for the past 2 months but suddenly worsened in intensity today.  Patient states that it feels like a spasm.  No bowel or bladder incontinence or saddle anesthesia.  Patient denies dysuria, hematuria or increased urinary frequency.  No nausea or vomiting at home.  Patient denies chest pain, chest tightness or shortness of breath.   Past Medical History:  Diagnosis Date   Agatston coronary artery calcium score greater than 400    Arrhythmia    atrial fibrillation   CHF (congestive heart failure) (HCC)    Diabetes mellitus (HCC)    x 3 years   DJD (degenerative joint disease)    Encephalitis    Fibromyalgia    HTN (hypertension)    Hyperlipidemia    Sleep apnea    No CPAP     Immunizations up to date:  Yes.     Past Medical History:  Diagnosis Date   Agatston coronary artery calcium score greater than 400    Arrhythmia    atrial fibrillation   CHF (congestive heart failure) ([place])    Diabetes mellitus ([place])    x 3 years   DJD (degenerative joint disease)    Encephalitis    Fibromyalgia    HTN (hypertension)    Hyperlipidemia    Sleep apnea    No CPAP    Patient Active Problem List   Diagnosis Date Noted   Fatigue 12/30/2020   Elevated coronary artery calcium score 12/30/2020   Unilateral primary osteoarthritis, left knee 10/06/2020   Respiratory failure, acute ([place]) 54/62/7035   Acute diastolic CHF (congestive heart failure) ([place]) 06/28/2020   Atrial fibrillation with rapid ventricular response ([place]) 06/28/2020   Acquired thrombophilia  ([place])    Depression    Atrial fibrillation with RVR ([place]) 06/16/2020   Diabetes mellitus ([place])    HTN (hypertension)    Sleep apnea    Obesity, Class III, BMI 40-49.9 (morbid obesity) ([place])    Chronic venous insufficiency 12/30/2018   Varicose veins of both lower extremities with inflammation 12/30/2018   DJD (degenerative joint disease) 12/30/2018   Snoring 11/27/2018   Daytime sleepiness 11/27/2018   Educated about COVID-19 virus infection 11/27/2018   SOB (shortness of breath) 11/27/2018   Hyperlipidemia 05/20/2015   Knee pain 06/06/2012    Past Surgical History:  Procedure Laterality Date   APPENDECTOMY     BREAST CYST EXCISION     BREAST EXCISIONAL BIOPSY Left 2004   COLONOSCOPY WITH PROPOFOL N/A 11/26/2020   Procedure: COLONOSCOPY WITH PROPOFOL;  Surgeon: Carol Ada, MD;  Location: [facility];  Service: Endoscopy;  Laterality: N/A;   HEMOSTASIS CLIP PLACEMENT  11/26/2020   Procedure: HEMOSTASIS CLIP PLACEMENT;  Surgeon: Carol Ada, MD;  Location: [facility];  Service: Endoscopy;;   KNEE ARTHROSCOPY     POLYPECTOMY  11/26/2020   Procedure: POLYPECTOMY;  Surgeon: Carol Ada, MD;  Location: [facility];  Service: Endoscopy;;   SUBMUCOSAL TATTOO INJECTION  11/26/2020   Procedure: SUBMUCOSAL TATTOO INJECTION;  Surgeon: Carol Ada, MD;  Location: [facility];  Service: Endoscopy;;   TEE WITHOUT CARDIOVERSION N/A 06/29/2020  Procedure: TRANSESOPHAGEAL ECHOCARDIOGRAM (TEE) with DCCV;  Surgeon: Dionisio David, MD;  Location: [facility] ORS;  Service: Cardiovascular;  Laterality: N/A;    Prior to Admission medications   Medication Sig Start Date End Date Taking? Authorizing Provider  HYDROcodone-acetaminophen (NORCO) 5-325 MG tablet Take 1 tablet by mouth every 6 (six) hours as needed for up to 5 days. 07/08/21 07/13/21 Yes Vallarie Mare M, PA-C  ondansetron (ZOFRAN-ODT) 4 MG disintegrating tablet Take 1 tablet (4 mg total) by mouth every 8 (eight) hours as needed for up  to 5 days for nausea or vomiting. 07/08/21 07/13/21 Yes Vallarie Mare M, PA-C  apixaban (ELIQUIS) 5 MG TABS tablet Take 1 tablet (5 mg total) by mouth 2 (two) times daily. 06/19/20   Loletha Grayer, MD  beta carotene w/minerals (OCUVITE) tablet Take 1 tablet by mouth daily.    [provider]  diltiazem (CARDIZEM CD) 240 MG 24 hr capsule Take 1 capsule (240 mg total) by mouth daily. 06/20/20   Loletha Grayer, MD  docusate sodium (COLACE) 100 MG capsule Take 100 mg by mouth daily as needed for mild constipation.    [provider]  DULoxetine (CYMBALTA) 60 MG capsule Take 60 mg by mouth daily.    [provider]  EPINEPHrine 0.3 mg/0.3 mL IJ SOAJ injection Inject 0.3 mg into the muscle as needed for anaphylaxis.    [provider]  Evolocumab (REPATHA SURECLICK) 213 MG/ML SOAJ Inject 140 mg into the skin every 14 (fourteen) days.    [provider]  Exenatide ER (BYDUREON) 2 MG PEN Inject 2 mg into the skin every Tuesday.    [provider]  fesoterodine (TOVIAZ) 4 MG TB24 tablet Take 4 mg by mouth daily.    [provider]  furosemide (LASIX) 20 MG tablet Take 1 tablet (20 mg total) by mouth 2 (two) times daily. Patient taking differently: Take 20 mg by mouth daily. 06/30/20   Ezekiel Slocumb, DO  metFORMIN (GLUCOPHAGE) 1000 MG tablet Take 1,000 mg by mouth daily with breakfast.     [provider]  sotalol (BETAPACE) 80 MG tablet Take 1 tablet (80 mg total) by mouth every 12 (twelve) hours. 06/19/20   Loletha Grayer, MD  valACYclovir (VALTREX) 1000 MG tablet Take 1,000 mg by mouth daily.    [provider]    Allergies Betadine [povidone iodine], Contrast media [iodinated contrast media], Iodine, Metrizamide, Povidone-iodine, Shellfish allergy, and Hydrocodone-acetaminophen  Family History  Problem Relation Age of Onset   CAD Mother 70   CAD Brother 24   Breast cancer Cousin     Social History Social  History   Tobacco Use   Smoking status: Former    Packs/day: 1.00    Years: 30.00    Pack years: 30.00    Types: Cigarettes    Quit date: 11/01/2008    Years since quitting: 12.6   Smokeless tobacco: Never  Vaping Use   Vaping Use: Never used  Substance Use Topics   Alcohol use: No    Alcohol/week: 0.0 standard drinks   Drug use: No     Review of Systems  Constitutional: No fever/chills Eyes:  No discharge ENT: No upper respiratory complaints. Respiratory: no cough. No SOB/ use of accessory muscles to breath Gastrointestinal:   No nausea, no vomiting.  No diarrhea.  No constipation. Musculoskeletal: Patient has low back pain.  Skin: Negative for rash, abrasions, lacerations, ecchymosis.    ____________________________________________   PHYSICAL EXAM:  VITAL SIGNS: ED  Triage Vitals  Enc Vitals Group     BP 07/08/21 1456 120/78     Pulse Rate 07/08/21 1456 69     Resp 07/08/21 1456 20     Temp 07/08/21 1456 97.6 F (36.4 C)     Temp Source 07/08/21 1456 Oral     SpO2 07/08/21 1456 94 %     Weight 07/08/21 1452 250 lb (113.4 kg)     Height 07/08/21 1452 5' (1.524 m)     Head Circumference --      Peak Flow --      Pain Score 07/08/21 1452 10     Pain Loc --      Pain Edu? --      Excl. in [place]? --      Constitutional: Alert and oriented. Well appearing and in no acute distress. Eyes: Conjunctivae are normal. PERRL. EOMI. Head: Atraumatic. ENT:      Nose: No congestion/rhinnorhea.      Mouth/Throat: Mucous membranes are moist.  Neck: No stridor.  No cervical spine tenderness to palpation. Cardiovascular: Normal rate, regular rhythm. Normal S1 and S2.  Good peripheral circulation. Respiratory: Normal respiratory effort without tachypnea or retractions. Lungs CTAB. Good air entry to the bases with no decreased or absent breath sounds Gastrointestinal: Bowel sounds x 4 quadrants. Soft and nontender to palpation. No guarding or rigidity. No  distention. Musculoskeletal: Full range of motion to all extremities. No obvious deformities noted Neurologic:  Normal for age. No gross focal neurologic deficits are appreciated.  Skin:  Skin is warm, dry and intact. No rash noted. Psychiatric: Mood and affect are normal for age. Speech and behavior are normal.   ____________________________________________   LABS (all labs ordered are listed, but only abnormal results are displayed)  Labs Reviewed - No data to display ____________________________________________  EKG   ____________________________________________  RADIOLOGY Unk Pinto, personally viewed and evaluated these images (plain radiographs) as part of my medical decision making, as well as reviewing the written report by the radiologist.  DG Lumbar Spine Complete  Result Date: 07/08/2021 CLINICAL DATA:  Fall, pain EXAM: LUMBAR SPINE - COMPLETE 4+ VIEW COMPARISON:  None. FINDINGS: There are 5 non-rib-bearing lumbar vertebrae. Prior lumbar fusion from L3-L5 with intervertebral disc spacer at L4-L5. There is degenerative disc disease above and below the fusion, moderate at L1-L2 and L2-L3 and mild at L5-S1. The intervertebral disc spacer at L4-L5 appears slightly displaced posteriorly in comparison to the prior exam in July. There is a new superior endplate irregularity of L3. IMPRESSION: Possible superior endplate fracture of L3. Slight posterior displacement of the L4-L5 intervertebral disc spacer in comparison to the prior exam. Recommend CT. Posterior fusion rods and pedicle screws appear unremarkable. Degenerative disc disease above and below the lumbar fusion. Electronically Signed   By: Maurine Simmering M.D.   On: 07/08/2021 15:32   CT Lumbar Spine Wo Contrast  Result Date: 07/08/2021 CLINICAL DATA:  Fall with low back pain history of prior surgery EXAM: CT LUMBAR SPINE WITHOUT CONTRAST TECHNIQUE: Multidetector CT imaging of the lumbar spine was performed without  intravenous contrast administration. Multiplanar CT image reconstructions were also generated. COMPARISON:  Radiograph 07/08/2021, 02/08/2021 FINDINGS: Segmentation: 5 lumbar type vertebrae. Alignment: Trace retrolisthesis L2 on L3. Trace anterolisthesis L4 on L5. Vertebrae: Vertebral body heights are maintained. No definitive fracture is seen. Paraspinal and other soft tissues: Aortic atherosclerosis. No paravertebral and or paraspinal soft tissue abnormality is seen Disc levels: Patient is status post spinal  rods and transpedicular screws from L3 through L5 with interbody device at L4-L5. Interbody device at L4-L5 is positioned eccentric to the right and posteriorly. There is mild subsidence of the inter vertebral spacer. At T12-L1, no significant canal stenosis. The foramen are patent. Small posterior disc osteophyte complex. Mild broad-based disc bulge. At L1-L2, disc space narrowing with vacuum discs. Broad-based disc bulge. Mild canal stenosis. There is mild bilateral foraminal narrowing. At L2-L3, disc space narrowing with vacuum disc. Moderate broad-based disc bulge. Mild canal stenosis. Mild bilateral foraminal narrowing. Facet hypertrophic facet degenerative changes and ligamentum flavum thickening. At L3-L4, appears to be left facetectomy. Mild to moderate right foraminal narrowing. At L4-L5, interbody device as described above. Probable right facetectomy. At L5-S1, mild disc space narrowing. No canal stenosis. Hypertrophic facet degenerative changes. Moderate right and mild left foraminal narrowing. IMPRESSION: 1. No acute fracture is seen. 2. Patient is status post spinal rods and transpedicular screws from L3 through L5 with interbody device at L4-L5. Mild subsidence of the interbody device which is positioned eccentric to the right posterior aspect of the disc space. Hardware appears grossly intact. Electronically Signed   By: Donavan Foil M.D.   On: 07/08/2021 19:31     ____________________________________________    PROCEDURES  Procedure(s) performed:     Procedures     Medications  oxyCODONE-acetaminophen (PERCOCET/ROXICET) 5-325 MG per tablet 1 tablet (1 tablet Oral Given 07/08/21 1941)  ondansetron (ZOFRAN-ODT) disintegrating tablet 4 mg (4 mg Oral Given 07/08/21 1942)     ____________________________________________   INITIAL IMPRESSION / ASSESSMENT AND PLAN / ED COURSE  Pertinent labs & imaging results that were available during my care of the patient were reviewed by me and considered in my medical decision making (see chart for details).      Assessment and plan:  Low back pain 70 year old female presents to the emergency department with left-sided low back pain that has occurred for the past month but has worsened in intensity over the past 24 hours.  Initial x-ray of the lumbar spine suggested displacement of the disc spacer at L4-L5 and recommended CT of the lumbar spine.  CT lumbar spine indicated mild subsidence of the interbody device positioned eccentric to the right posterior aspect of the disc space with intact hardware.  Patient has a follow-up appointment with her neurosurgeon in early January.  Recommended keeping appointment.  Patient declined urinalysis to check for UTI or hematuria.  Patient was prescribed a short course of Norco for pain until she can see neurosurgery.  Return precautions were given to return with fever, nausea, vomiting or hematuria.  ____________________________________________  FINAL CLINICAL IMPRESSION(S) / ED DIAGNOSES  Final diagnoses:  Acute left-sided low back pain without sciatica      NEW MEDICATIONS STARTED DURING THIS VISIT:  ED Discharge Orders          Ordered    HYDROcodone-acetaminophen (NORCO) 5-325 MG tablet  Every 6 hours PRN        07/08/21 1958    ondansetron (ZOFRAN-ODT) 4 MG disintegrating tablet  Every 8 hours PRN        07/08/21 1958                 This chart was dictated using voice recognition software/Dragon. Despite best efforts to proofread, errors can occur which can change the meaning. Any change was purely unintentional.     Lannie Fields, PA-C 07/08/21 2016    Vanessa Argyle, MD 07/09/21 1150

## 2021-07-08 NOTE — ED Triage Notes (Signed)
Pt reports had back surgery in August and last week went to sit down and chair moved and she fell. Pt reports pain to lower back from then on.

## 2021-07-08 NOTE — Discharge Instructions (Signed)
Please keep appointment with neurosurgery.  You can take Norco for pain.  Return with any abdominal pain, nausea, hematuria or fever.

## 2021-07-15 DIAGNOSIS — M48062 Spinal stenosis, lumbar region with neurogenic claudication: Secondary | ICD-10-CM | POA: Diagnosis not present

## 2021-07-15 DIAGNOSIS — M4726 Other spondylosis with radiculopathy, lumbar region: Secondary | ICD-10-CM | POA: Diagnosis not present

## 2021-07-15 DIAGNOSIS — M4316 Spondylolisthesis, lumbar region: Secondary | ICD-10-CM | POA: Diagnosis not present

## 2021-07-15 DIAGNOSIS — M4326 Fusion of spine, lumbar region: Secondary | ICD-10-CM | POA: Diagnosis not present

## 2021-07-15 DIAGNOSIS — M545 Low back pain, unspecified: Secondary | ICD-10-CM | POA: Diagnosis not present

## 2021-07-26 DIAGNOSIS — E1129 Type 2 diabetes mellitus with other diabetic kidney complication: Secondary | ICD-10-CM | POA: Diagnosis not present

## 2021-07-26 DIAGNOSIS — I2510 Atherosclerotic heart disease of native coronary artery without angina pectoris: Secondary | ICD-10-CM | POA: Diagnosis not present

## 2021-07-26 DIAGNOSIS — G720 Drug-induced myopathy: Secondary | ICD-10-CM | POA: Diagnosis not present

## 2021-07-26 DIAGNOSIS — I10 Essential (primary) hypertension: Secondary | ICD-10-CM | POA: Diagnosis not present

## 2021-07-28 DIAGNOSIS — E782 Mixed hyperlipidemia: Secondary | ICD-10-CM | POA: Diagnosis not present

## 2021-07-28 DIAGNOSIS — E668 Other obesity: Secondary | ICD-10-CM | POA: Diagnosis not present

## 2021-07-28 DIAGNOSIS — G4730 Sleep apnea, unspecified: Secondary | ICD-10-CM | POA: Diagnosis not present

## 2021-07-28 DIAGNOSIS — I340 Nonrheumatic mitral (valve) insufficiency: Secondary | ICD-10-CM | POA: Diagnosis not present

## 2021-07-28 DIAGNOSIS — E669 Obesity, unspecified: Secondary | ICD-10-CM | POA: Diagnosis not present

## 2021-07-28 DIAGNOSIS — R002 Palpitations: Secondary | ICD-10-CM | POA: Diagnosis not present

## 2021-07-28 DIAGNOSIS — I10 Essential (primary) hypertension: Secondary | ICD-10-CM | POA: Diagnosis not present

## 2021-07-28 DIAGNOSIS — I4891 Unspecified atrial fibrillation: Secondary | ICD-10-CM | POA: Diagnosis not present

## 2021-08-23 DIAGNOSIS — Z20822 Contact with and (suspected) exposure to covid-19: Secondary | ICD-10-CM | POA: Diagnosis not present

## 2021-08-25 DIAGNOSIS — M170 Bilateral primary osteoarthritis of knee: Secondary | ICD-10-CM | POA: Diagnosis not present

## 2021-08-25 DIAGNOSIS — M25562 Pain in left knee: Secondary | ICD-10-CM | POA: Diagnosis not present

## 2021-08-25 DIAGNOSIS — M1711 Unilateral primary osteoarthritis, right knee: Secondary | ICD-10-CM | POA: Diagnosis not present

## 2021-08-25 DIAGNOSIS — M25561 Pain in right knee: Secondary | ICD-10-CM | POA: Diagnosis not present

## 2021-08-31 DIAGNOSIS — M25562 Pain in left knee: Secondary | ICD-10-CM | POA: Diagnosis not present

## 2021-08-31 DIAGNOSIS — M1712 Unilateral primary osteoarthritis, left knee: Secondary | ICD-10-CM | POA: Diagnosis not present

## 2021-09-01 DIAGNOSIS — M25561 Pain in right knee: Secondary | ICD-10-CM | POA: Diagnosis not present

## 2021-09-01 DIAGNOSIS — M1711 Unilateral primary osteoarthritis, right knee: Secondary | ICD-10-CM | POA: Diagnosis not present

## 2021-09-07 DIAGNOSIS — M25562 Pain in left knee: Secondary | ICD-10-CM | POA: Diagnosis not present

## 2021-09-07 DIAGNOSIS — M1712 Unilateral primary osteoarthritis, left knee: Secondary | ICD-10-CM | POA: Diagnosis not present

## 2021-09-08 DIAGNOSIS — R350 Frequency of micturition: Secondary | ICD-10-CM | POA: Diagnosis not present

## 2021-09-08 DIAGNOSIS — R32 Unspecified urinary incontinence: Secondary | ICD-10-CM | POA: Diagnosis not present

## 2021-09-08 DIAGNOSIS — R351 Nocturia: Secondary | ICD-10-CM | POA: Diagnosis not present

## 2021-09-08 DIAGNOSIS — M25561 Pain in right knee: Secondary | ICD-10-CM | POA: Diagnosis not present

## 2021-09-08 DIAGNOSIS — N3946 Mixed incontinence: Secondary | ICD-10-CM | POA: Diagnosis not present

## 2021-09-08 DIAGNOSIS — M1711 Unilateral primary osteoarthritis, right knee: Secondary | ICD-10-CM | POA: Diagnosis not present

## 2021-09-08 DIAGNOSIS — Z6841 Body Mass Index (BMI) 40.0 and over, adult: Secondary | ICD-10-CM | POA: Diagnosis not present

## 2021-09-14 DIAGNOSIS — M1712 Unilateral primary osteoarthritis, left knee: Secondary | ICD-10-CM | POA: Diagnosis not present

## 2021-09-14 DIAGNOSIS — M25562 Pain in left knee: Secondary | ICD-10-CM | POA: Diagnosis not present

## 2021-09-26 DIAGNOSIS — E669 Obesity, unspecified: Secondary | ICD-10-CM | POA: Diagnosis not present

## 2021-09-26 DIAGNOSIS — E668 Other obesity: Secondary | ICD-10-CM | POA: Diagnosis not present

## 2021-09-26 DIAGNOSIS — I4891 Unspecified atrial fibrillation: Secondary | ICD-10-CM | POA: Diagnosis not present

## 2021-09-26 DIAGNOSIS — E782 Mixed hyperlipidemia: Secondary | ICD-10-CM | POA: Diagnosis not present

## 2021-09-26 DIAGNOSIS — I10 Essential (primary) hypertension: Secondary | ICD-10-CM | POA: Diagnosis not present

## 2021-09-26 DIAGNOSIS — I340 Nonrheumatic mitral (valve) insufficiency: Secondary | ICD-10-CM | POA: Diagnosis not present

## 2021-09-26 DIAGNOSIS — R002 Palpitations: Secondary | ICD-10-CM | POA: Diagnosis not present

## 2021-09-26 DIAGNOSIS — E119 Type 2 diabetes mellitus without complications: Secondary | ICD-10-CM | POA: Diagnosis not present

## 2021-10-04 IMAGING — CT CT CHEST LUNG CANCER SCREENING LOW DOSE W/O CM
2 of 5 series · 15 of 40 positions shown, 18 images · non-contrast
Comparison: 10/22/2019 screening chest CT.

CLINICAL DATA: 69-year-old asymptomatic female former smoker with
60 pack-year smoking history, quit smoking 9 years prior.

EXAM:
CT CHEST WITHOUT CONTRAST LOW-DOSE FOR LUNG CANCER SCREENING
TECHNIQUE: Multidetector CT imaging of the chest was performed following the
standard protocol without IV contrast.

[Series 4: lung 1.00 br44 cor · coronal · 0.59mm/px · 3 of 370 slices shown]
[im 74/370  lung]
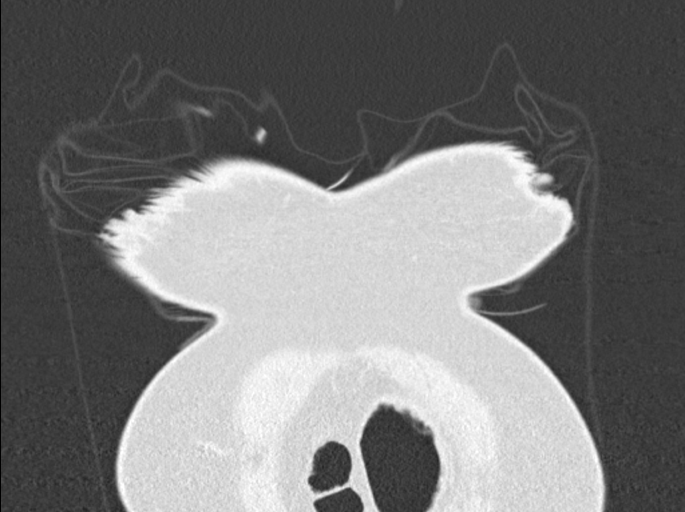
[im 148/370  lung]
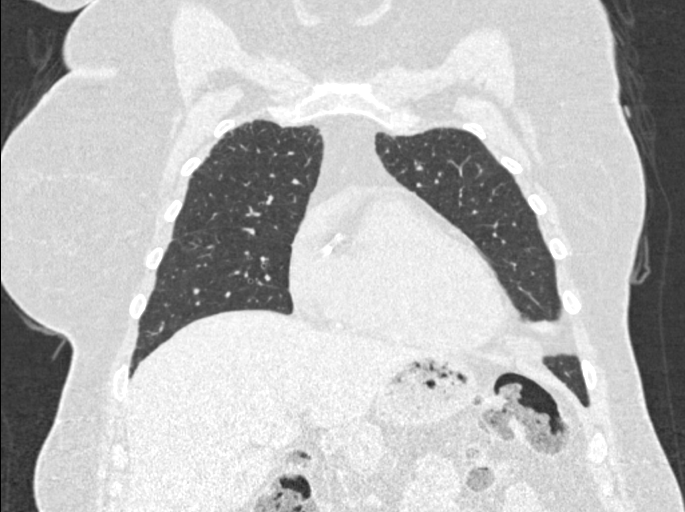
[im 222/370  lung]
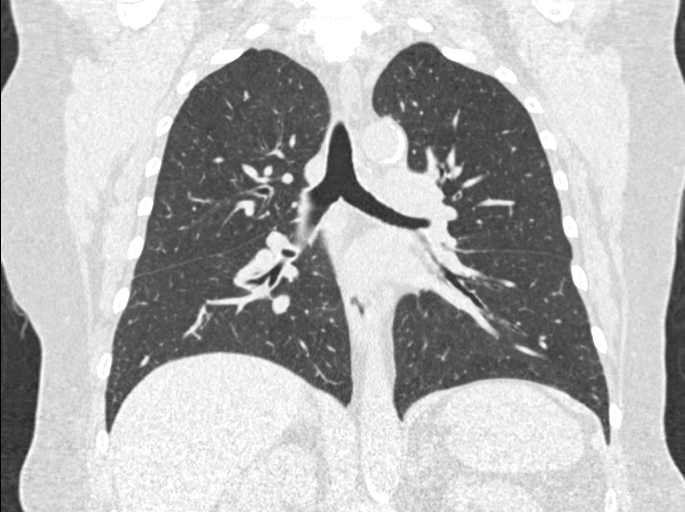

[Series 9: lung 1.00 br60 axial · axial · 0.79mm/px · z∈[-1161,-887]mm · 12 of 304 slices shown, 15 images]
[im 15/304  mediastinal]
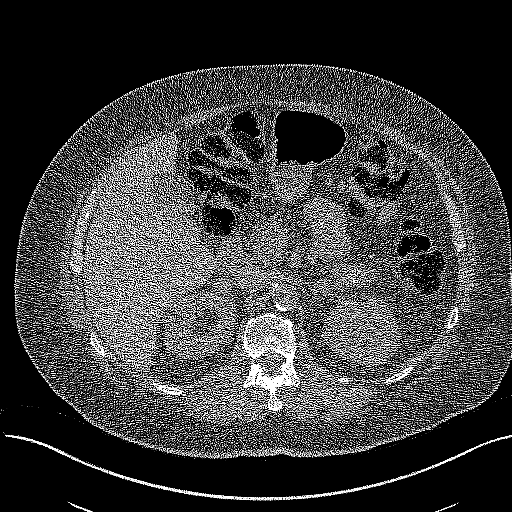
[im 15/304  lung]
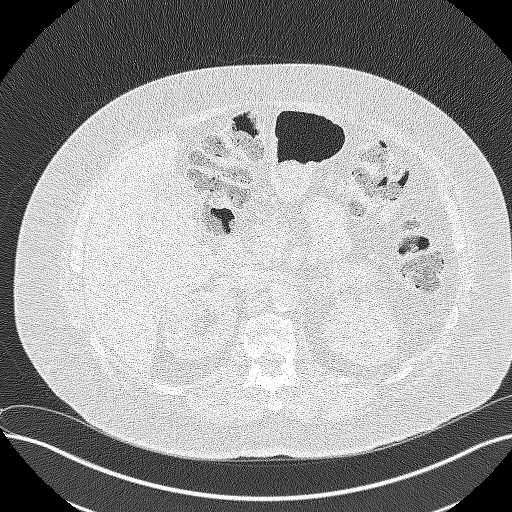
[im 44/304  lung]
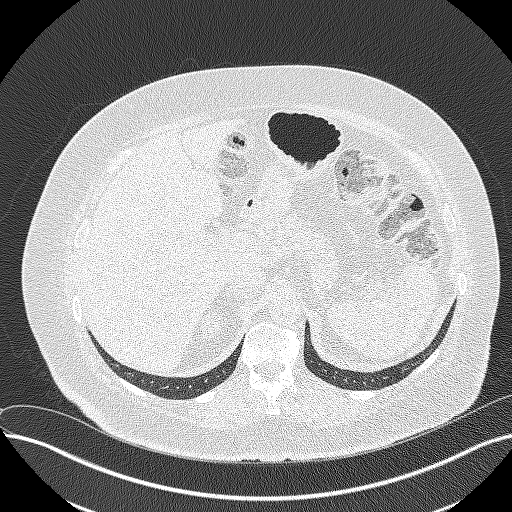
[im 73/304  lung]
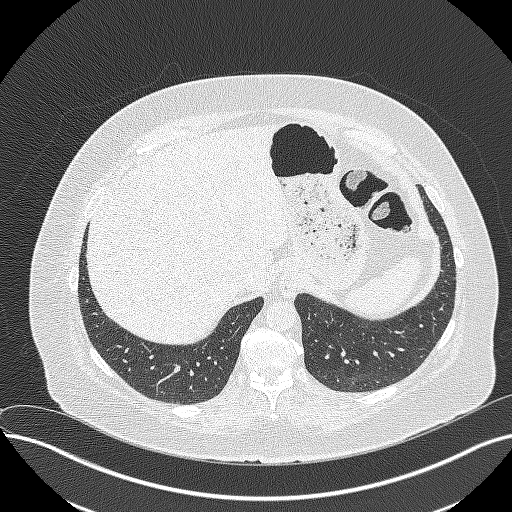
[im 87/304  lung]
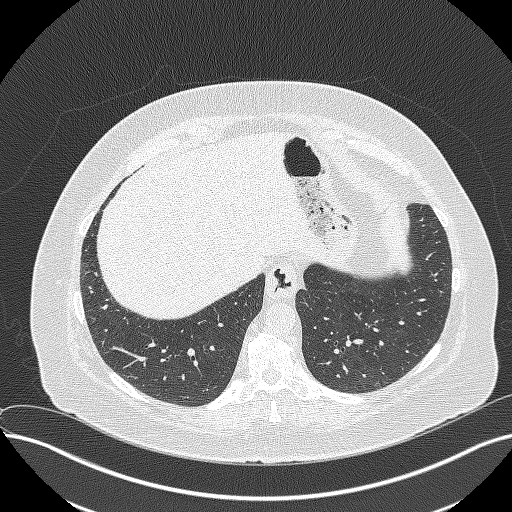
[im 116/304  mediastinal]
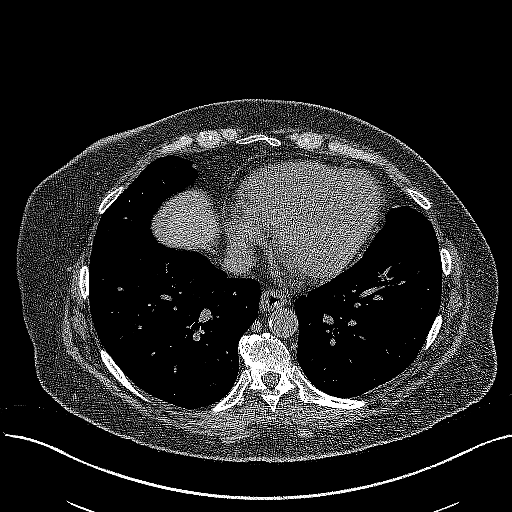
[im 116/304  lung]
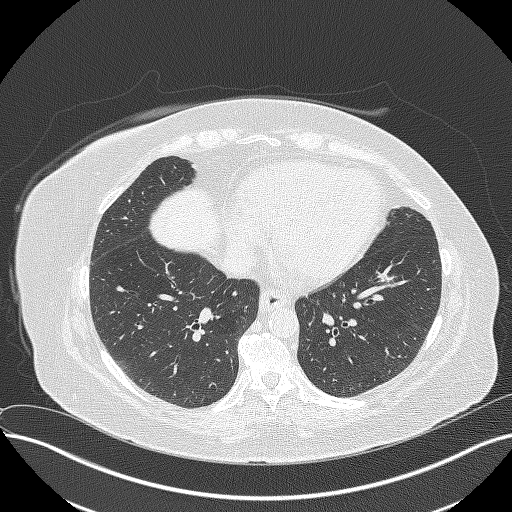
[im 145/304  lung]
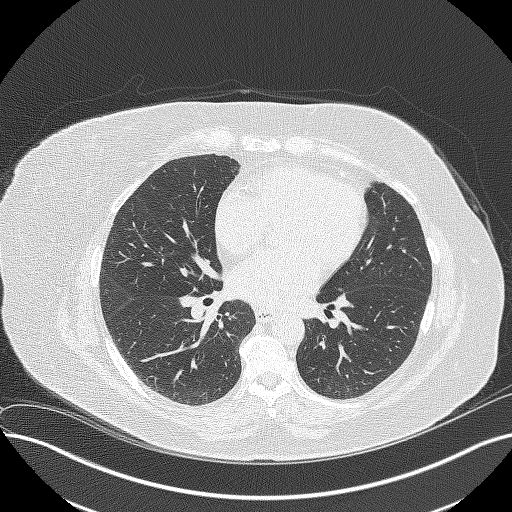
[im 159/304  lung]
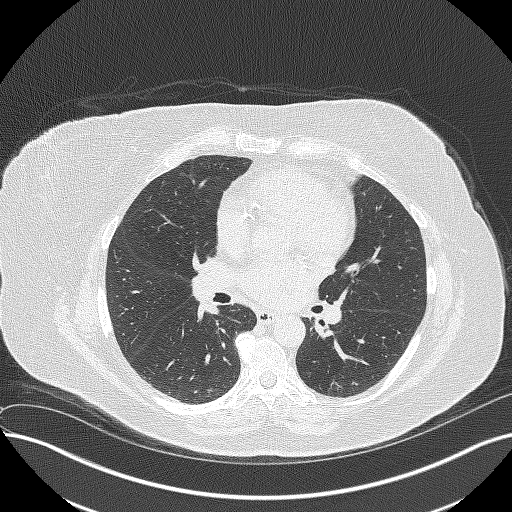
[im 188/304  lung]
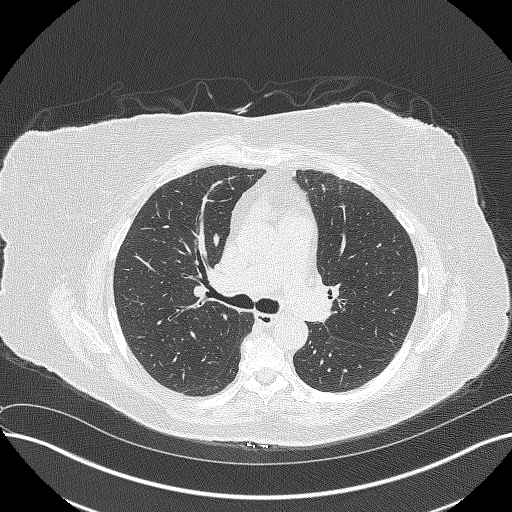
[im 217/304  mediastinal]
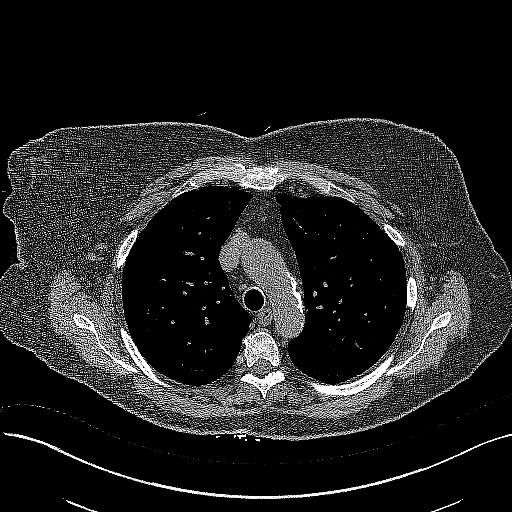
[im 217/304  lung]
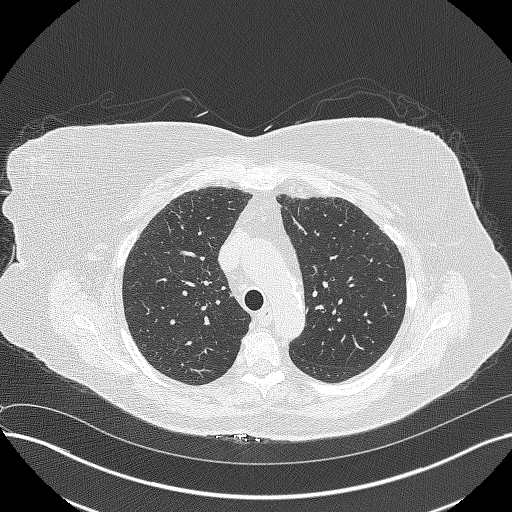
[im 231/304  lung]
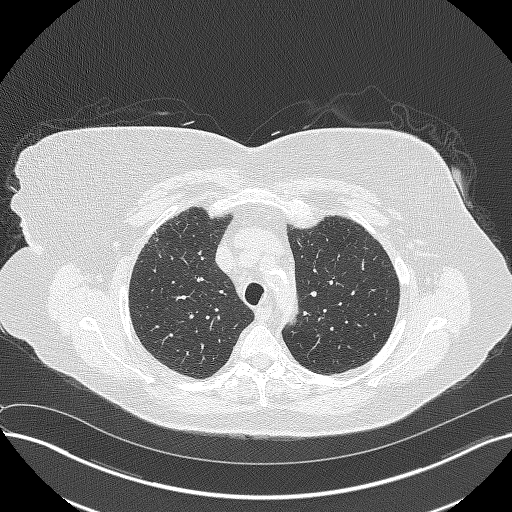
[im 260/304  lung]
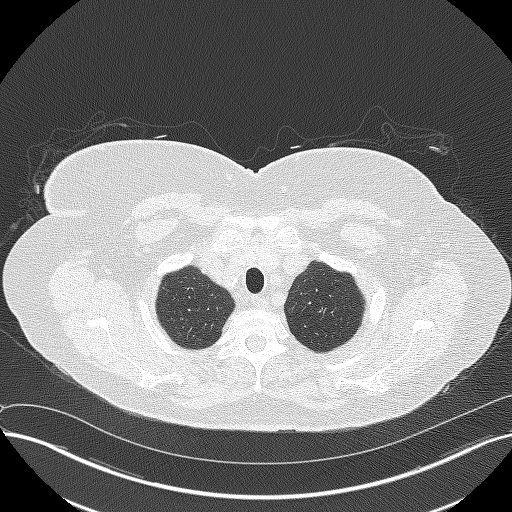
[im 289/304  lung]
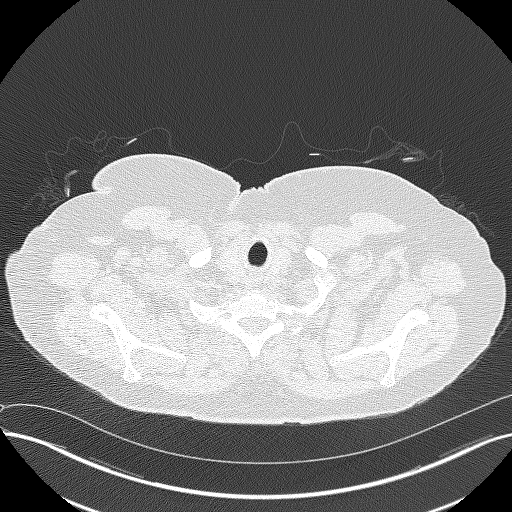

[15 of 40 positions shown; findings below may reference images not displayed]

FINDINGS: Cardiovascular: Normal heart size. No significant pericardial
effusion/thickening. Three-vessel coronary atherosclerosis.
Atherosclerotic nonaneurysmal thoracic aorta. Normal caliber
pulmonary arteries.

Mediastinum/Nodes: No discrete thyroid nodules. Unremarkable
esophagus. No pathologically enlarged axillary, mediastinal or hilar
lymph nodes, noting limited sensitivity for the detection of hilar
adenopathy on this noncontrast study. Cystic 1.9 x 1.4 cm left
pericardiophrenic structure is stable, favor a benign pericardial
cyst.

Lungs/Pleura: No pneumothorax. No pleural effusion. Mild
centrilobular emphysema with diffuse bronchial wall thickening. No
acute consolidative airspace disease or lung masses. No significant
growth of previously visualized scattered pulmonary nodules. No new
significant pulmonary nodules.

Upper abdomen: Small hiatal hernia.  Cholelithiasis.

Musculoskeletal: No aggressive appearing focal osseous lesions.
Moderate thoracic spondylosis.
IMPRESSION: 1. Lung-RADS 2, benign appearance or behavior. Continue annual
screening with low-dose chest CT without contrast in 12 months.
2. Three-vessel coronary atherosclerosis.
3. Small hiatal hernia.
4. Cholelithiasis.
5. Aortic Atherosclerosis (CTV9U-ZHE.E) and Emphysema ([id number]-EW8.H).

## 2021-10-06 DIAGNOSIS — E785 Hyperlipidemia, unspecified: Secondary | ICD-10-CM | POA: Diagnosis not present

## 2021-10-06 DIAGNOSIS — I10 Essential (primary) hypertension: Secondary | ICD-10-CM | POA: Diagnosis not present

## 2021-10-06 DIAGNOSIS — E1129 Type 2 diabetes mellitus with other diabetic kidney complication: Secondary | ICD-10-CM | POA: Diagnosis not present

## 2021-10-07 DIAGNOSIS — M7551 Bursitis of right shoulder: Secondary | ICD-10-CM | POA: Diagnosis not present

## 2021-10-07 DIAGNOSIS — M50123 Cervical disc disorder at C6-C7 level with radiculopathy: Secondary | ICD-10-CM | POA: Diagnosis not present

## 2021-10-07 DIAGNOSIS — M4326 Fusion of spine, lumbar region: Secondary | ICD-10-CM | POA: Diagnosis not present

## 2021-10-07 DIAGNOSIS — M48062 Spinal stenosis, lumbar region with neurogenic claudication: Secondary | ICD-10-CM | POA: Diagnosis not present

## 2021-10-07 DIAGNOSIS — M50122 Cervical disc disorder at C5-C6 level with radiculopathy: Secondary | ICD-10-CM | POA: Diagnosis not present

## 2021-10-11 DIAGNOSIS — Z1339 Encounter for screening examination for other mental health and behavioral disorders: Secondary | ICD-10-CM | POA: Diagnosis not present

## 2021-10-11 DIAGNOSIS — J439 Emphysema, unspecified: Secondary | ICD-10-CM | POA: Diagnosis not present

## 2021-10-11 DIAGNOSIS — Z0000 Encounter for general adult medical examination without abnormal findings: Secondary | ICD-10-CM | POA: Diagnosis not present

## 2021-10-11 DIAGNOSIS — R82998 Other abnormal findings in urine: Secondary | ICD-10-CM | POA: Diagnosis not present

## 2021-10-11 DIAGNOSIS — F17201 Nicotine dependence, unspecified, in remission: Secondary | ICD-10-CM | POA: Diagnosis not present

## 2021-10-11 DIAGNOSIS — Z1331 Encounter for screening for depression: Secondary | ICD-10-CM | POA: Diagnosis not present

## 2021-10-11 DIAGNOSIS — E1149 Type 2 diabetes mellitus with other diabetic neurological complication: Secondary | ICD-10-CM | POA: Diagnosis not present

## 2021-10-11 DIAGNOSIS — I480 Paroxysmal atrial fibrillation: Secondary | ICD-10-CM | POA: Diagnosis not present

## 2021-10-11 DIAGNOSIS — I10 Essential (primary) hypertension: Secondary | ICD-10-CM | POA: Diagnosis not present

## 2021-10-11 DIAGNOSIS — D6869 Other thrombophilia: Secondary | ICD-10-CM | POA: Diagnosis not present

## 2021-10-11 DIAGNOSIS — I2510 Atherosclerotic heart disease of native coronary artery without angina pectoris: Secondary | ICD-10-CM | POA: Diagnosis not present

## 2021-10-11 DIAGNOSIS — L309 Dermatitis, unspecified: Secondary | ICD-10-CM | POA: Diagnosis not present

## 2021-10-11 DIAGNOSIS — I700 Atherosclerosis of aorta: Secondary | ICD-10-CM | POA: Diagnosis not present

## 2021-10-11 DIAGNOSIS — E1129 Type 2 diabetes mellitus with other diabetic kidney complication: Secondary | ICD-10-CM | POA: Diagnosis not present

## 2021-10-12 ENCOUNTER — Other Ambulatory Visit: Payer: Self-pay | Admitting: Internal Medicine

## 2021-10-12 DIAGNOSIS — F17201 Nicotine dependence, unspecified, in remission: Secondary | ICD-10-CM

## 2021-10-13 DIAGNOSIS — M542 Cervicalgia: Secondary | ICD-10-CM | POA: Diagnosis not present

## 2021-10-17 DIAGNOSIS — M542 Cervicalgia: Secondary | ICD-10-CM | POA: Diagnosis not present

## 2021-10-19 DIAGNOSIS — M542 Cervicalgia: Secondary | ICD-10-CM | POA: Diagnosis not present

## 2021-10-25 DIAGNOSIS — M542 Cervicalgia: Secondary | ICD-10-CM | POA: Diagnosis not present

## 2021-10-26 ENCOUNTER — Ambulatory Visit
Admission: RE | Admit: 2021-10-26 | Discharge: 2021-10-26 | Disposition: A | Discharge status: Home / Self Care | Payer: Medicare Other | Source: Ambulatory Visit | Attending: Internal Medicine | Admitting: Internal Medicine

## 2021-10-26 DIAGNOSIS — J432 Centrilobular emphysema: Secondary | ICD-10-CM | POA: Diagnosis not present

## 2021-10-26 DIAGNOSIS — I358 Other nonrheumatic aortic valve disorders: Secondary | ICD-10-CM | POA: Diagnosis not present

## 2021-10-26 DIAGNOSIS — Z87891 Personal history of nicotine dependence: Secondary | ICD-10-CM | POA: Diagnosis not present

## 2021-10-26 DIAGNOSIS — F17201 Nicotine dependence, unspecified, in remission: Secondary | ICD-10-CM

## 2021-10-26 DIAGNOSIS — R911 Solitary pulmonary nodule: Secondary | ICD-10-CM

## 2021-10-26 DIAGNOSIS — I2510 Atherosclerotic heart disease of native coronary artery without angina pectoris: Secondary | ICD-10-CM | POA: Diagnosis not present

## 2021-10-26 HISTORY — DX: Solitary pulmonary nodule: R91.1

## 2021-10-27 ENCOUNTER — Other Ambulatory Visit (HOSPITAL_COMMUNITY): Payer: Self-pay | Admitting: Internal Medicine

## 2021-10-27 ENCOUNTER — Other Ambulatory Visit: Payer: Self-pay | Admitting: Internal Medicine

## 2021-10-27 DIAGNOSIS — R911 Solitary pulmonary nodule: Secondary | ICD-10-CM

## 2021-10-28 ENCOUNTER — Encounter: Payer: Self-pay | Admitting: *Deleted

## 2021-10-28 DIAGNOSIS — M542 Cervicalgia: Secondary | ICD-10-CM | POA: Diagnosis not present

## 2021-10-28 DIAGNOSIS — E119 Type 2 diabetes mellitus without complications: Secondary | ICD-10-CM | POA: Diagnosis not present

## 2021-10-28 DIAGNOSIS — I10 Essential (primary) hypertension: Secondary | ICD-10-CM | POA: Diagnosis not present

## 2021-10-28 NOTE — Progress Notes (Signed)
Referral received to follow up with patient after PET scan for lung nodule. PET scheduled for 4/26. Per Dr. Jacinto Reap, pt needs to be seen in lung clinic Friday 4/28 at 9:30am. Pt will be called with appt. Nothing further needed at this time.  ?

## 2021-11-01 DIAGNOSIS — M542 Cervicalgia: Secondary | ICD-10-CM | POA: Diagnosis not present

## 2021-11-02 DIAGNOSIS — N3946 Mixed incontinence: Secondary | ICD-10-CM | POA: Diagnosis not present

## 2021-11-04 DIAGNOSIS — M542 Cervicalgia: Secondary | ICD-10-CM | POA: Diagnosis not present

## 2021-11-07 DIAGNOSIS — Z20822 Contact with and (suspected) exposure to covid-19: Secondary | ICD-10-CM | POA: Diagnosis not present

## 2021-11-07 DIAGNOSIS — M542 Cervicalgia: Secondary | ICD-10-CM | POA: Diagnosis not present

## 2021-11-09 ENCOUNTER — Ambulatory Visit (HOSPITAL_COMMUNITY)
Admission: RE | Admit: 2021-11-09 | Discharge: 2021-11-09 | Disposition: A | Discharge status: Home / Self Care | Payer: Medicare Other | Source: Ambulatory Visit | Attending: Internal Medicine | Admitting: Internal Medicine

## 2021-11-09 DIAGNOSIS — R911 Solitary pulmonary nodule: Secondary | ICD-10-CM | POA: Insufficient documentation

## 2021-11-09 LAB — GLUCOSE, CAPILLARY: Glucose-Capillary: 107 mg/dL — ABNORMAL HIGH (ref 70–99)

## 2021-11-09 MED ORDER — FLUDEOXYGLUCOSE F - 18 (FDG) INJECTION
12.0000 | Freq: Once | INTRAVENOUS | Status: AC | PRN
  Administered 2021-11-09: 12 via INTRAVENOUS

## 2021-11-10 DIAGNOSIS — M542 Cervicalgia: Secondary | ICD-10-CM | POA: Diagnosis not present

## 2021-11-10 DIAGNOSIS — N398 Other specified disorders of urinary system: Secondary | ICD-10-CM | POA: Diagnosis not present

## 2021-11-10 DIAGNOSIS — Z6841 Body Mass Index (BMI) 40.0 and over, adult: Secondary | ICD-10-CM | POA: Diagnosis not present

## 2021-11-10 DIAGNOSIS — N3946 Mixed incontinence: Secondary | ICD-10-CM | POA: Diagnosis not present

## 2021-11-11 ENCOUNTER — Encounter: Payer: Self-pay | Admitting: *Deleted

## 2021-11-11 ENCOUNTER — Inpatient Hospital Stay: Payer: Medicare Other

## 2021-11-11 ENCOUNTER — Inpatient Hospital Stay: Payer: Medicare Other | Attending: Internal Medicine | Admitting: Internal Medicine

## 2021-11-11 ENCOUNTER — Encounter: Payer: Self-pay | Admitting: Internal Medicine

## 2021-11-11 DIAGNOSIS — Z808 Family history of malignant neoplasm of other organs or systems: Secondary | ICD-10-CM | POA: Insufficient documentation

## 2021-11-11 DIAGNOSIS — J9601 Acute respiratory failure with hypoxia: Secondary | ICD-10-CM | POA: Insufficient documentation

## 2021-11-11 DIAGNOSIS — Z9049 Acquired absence of other specified parts of digestive tract: Secondary | ICD-10-CM | POA: Insufficient documentation

## 2021-11-11 DIAGNOSIS — Z888 Allergy status to other drugs, medicaments and biological substances status: Secondary | ICD-10-CM | POA: Diagnosis not present

## 2021-11-11 DIAGNOSIS — I110 Hypertensive heart disease with heart failure: Secondary | ICD-10-CM | POA: Diagnosis not present

## 2021-11-11 DIAGNOSIS — Z803 Family history of malignant neoplasm of breast: Secondary | ICD-10-CM | POA: Insufficient documentation

## 2021-11-11 DIAGNOSIS — G4730 Sleep apnea, unspecified: Secondary | ICD-10-CM | POA: Insufficient documentation

## 2021-11-11 DIAGNOSIS — Z87891 Personal history of nicotine dependence: Secondary | ICD-10-CM | POA: Insufficient documentation

## 2021-11-11 DIAGNOSIS — I5033 Acute on chronic diastolic (congestive) heart failure: Secondary | ICD-10-CM | POA: Diagnosis not present

## 2021-11-11 DIAGNOSIS — J432 Centrilobular emphysema: Secondary | ICD-10-CM | POA: Diagnosis not present

## 2021-11-11 DIAGNOSIS — Z833 Family history of diabetes mellitus: Secondary | ICD-10-CM | POA: Insufficient documentation

## 2021-11-11 DIAGNOSIS — K8020 Calculus of gallbladder without cholecystitis without obstruction: Secondary | ICD-10-CM | POA: Insufficient documentation

## 2021-11-11 DIAGNOSIS — Z8249 Family history of ischemic heart disease and other diseases of the circulatory system: Secondary | ICD-10-CM | POA: Insufficient documentation

## 2021-11-11 DIAGNOSIS — Z79899 Other long term (current) drug therapy: Secondary | ICD-10-CM | POA: Insufficient documentation

## 2021-11-11 DIAGNOSIS — I2510 Atherosclerotic heart disease of native coronary artery without angina pectoris: Secondary | ICD-10-CM | POA: Insufficient documentation

## 2021-11-11 DIAGNOSIS — I4891 Unspecified atrial fibrillation: Secondary | ICD-10-CM | POA: Insufficient documentation

## 2021-11-11 DIAGNOSIS — R911 Solitary pulmonary nodule: Secondary | ICD-10-CM | POA: Diagnosis not present

## 2021-11-11 DIAGNOSIS — I700 Atherosclerosis of aorta: Secondary | ICD-10-CM | POA: Insufficient documentation

## 2021-11-11 DIAGNOSIS — Z800 Family history of malignant neoplasm of digestive organs: Secondary | ICD-10-CM | POA: Insufficient documentation

## 2021-11-11 DIAGNOSIS — Z885 Allergy status to narcotic agent status: Secondary | ICD-10-CM | POA: Diagnosis not present

## 2021-11-11 DIAGNOSIS — M2550 Pain in unspecified joint: Secondary | ICD-10-CM | POA: Insufficient documentation

## 2021-11-11 DIAGNOSIS — Z7901 Long term (current) use of anticoagulants: Secondary | ICD-10-CM | POA: Insufficient documentation

## 2021-11-11 NOTE — Progress Notes (Signed)
[facility] ?CONSULT NOTE ? ?Patient Care Team: ?Ginger Organ., MD as PCP - General (Internal Medicine) ?[name], MD as PCP - Cardiology (Cardiology) ?Telford Nab, RN as Sales executive ? ?CHIEF COMPLAINTS/PURPOSE OF CONSULTATION: lung nodule/mass ? ?#  ?Oncology History  ? No history exists.  ? ?IMPRESSION: ?Spiculated nodule in the LEFT lower lobe without substantial change ?in size and without signs of increased metabolic activity on FDG PET ?at this time. Indolent bronchogenic neoplasm remains a differential ?consideration. Three-month follow-up chest CT may be helpful for ?further assessment. Referral to multi disciplinary thoracic ?oncologic setting if not yet performed is suggested. Aortic atherosclerosis and coronary artery disease.  ?Cholelithiasis.  ?Abundant stool in the colon, query constipation. ?  ?  ?Electronically Signed ?  By: Zetta Bills M.D. ?  On: 11/09/2021 13:40 ? ?#  FEB 2023- A-fib with RVR and acute decompensation of diastolic CHF and associated acute hypoxic respiratory failure. ? ?HISTORY OF PRESENTING ILLNESS:  ?Tammy [name] 71 y.o.  female history of smoking-currently quit is here for further evaluation and recommendations for lung nodule. ? ?The patient as part of the lung cancer screening program-low-dose CT scan in April 2023 showed about a 10 mm left lower lobe lung nodule.  Patient had a further PET scan. ? ?Patient denies any worsening shortness of breath or cough.  Denies any worsening swelling in the legs.  Denies any wheezing.  Denies any hemoptysis.  ? ? ?Review of Systems  ?Constitutional:  Negative for chills, diaphoresis, fever, malaise/fatigue and weight loss.  ?HENT:  Negative for nosebleeds and sore throat.   ?Eyes:  Negative for double vision.  ?Respiratory:  Negative for cough, hemoptysis, sputum production, shortness of breath and wheezing.   ?Cardiovascular:  Negative for chest pain, palpitations, orthopnea and leg  swelling.  ?Gastrointestinal:  Negative for abdominal pain, blood in stool, constipation, diarrhea, heartburn, melena, nausea and vomiting.  ?Genitourinary:  Negative for dysuria, frequency and urgency.  ?Musculoskeletal:  Positive for joint pain. Negative for back pain.  ?Skin: Negative.  Negative for itching and rash.  ?Neurological:  Negative for dizziness, tingling, focal weakness, weakness and headaches.  ?Endo/Heme/Allergies:  Does not bruise/bleed easily.  ?Psychiatric/Behavioral:  Negative for depression. The patient is not nervous/anxious and does not have insomnia.    ? ?MEDICAL HISTORY:  ?Past Medical History:  ?Diagnosis Date  ? Agatston coronary artery calcium score greater than 400   ? Arrhythmia   ? atrial fibrillation  ? CHF (congestive heart failure) ([place])   ? Diabetes mellitus ([place])   ? x 3 years  ? DJD (degenerative joint disease)   ? Encephalitis   ? Fibromyalgia   ? HTN (hypertension)   ? Hyperlipidemia   ? Sleep apnea   ? No CPAP  ? ? ?SURGICAL HISTORY: ?Past Surgical History:  ?Procedure Laterality Date  ? APPENDECTOMY    ? BREAST CYST EXCISION    ? BREAST EXCISIONAL BIOPSY Left 2004  ? COLONOSCOPY WITH PROPOFOL N/A 11/26/2020  ? Procedure: COLONOSCOPY WITH PROPOFOL;  Surgeon: Carol Ada, MD;  Location: [facility];  Service: Endoscopy;  Laterality: N/A;  ? HEMOSTASIS CLIP PLACEMENT  11/26/2020  ? Procedure: HEMOSTASIS CLIP PLACEMENT;  Surgeon: Carol Ada, MD;  Location: [facility];  Service: Endoscopy;;  ? KNEE ARTHROSCOPY    ? POLYPECTOMY  11/26/2020  ? Procedure: POLYPECTOMY;  Surgeon: Carol Ada, MD;  Location: [facility];  Service: Endoscopy;;  ? SUBMUCOSAL TATTOO INJECTION  11/26/2020  ? Procedure:  SUBMUCOSAL TATTOO INJECTION;  Surgeon: Carol Ada, MD;  Location: [facility];  Service: Endoscopy;;  ? TEE WITHOUT CARDIOVERSION N/A 06/29/2020  ? Procedure: TRANSESOPHAGEAL ECHOCARDIOGRAM (TEE) with DCCV;  Surgeon: Dionisio David, MD;  Location: [facility];  Service:  Cardiovascular;  Laterality: N/A;  ? ? ?SOCIAL HISTORY: ?Social History  ? ?Socioeconomic History  ? Marital status: Married  ?  Spouse name: Not on file  ? Number of children: 0  ? Years of education: Not on file  ? Highest education level: Not on file  ?Occupational History  ? Not on file  ?Tobacco Use  ? Smoking status: Former  ?  Packs/day: 1.00  ?  Years: 30.00  ?  Pack years: 30.00  ?  Types: Cigarettes  ?  Quit date: 11/01/2008  ?  Years since quitting: 13.0  ? Smokeless tobacco: Never  ?Vaping Use  ? Vaping Use: Never used  ?Substance and Sexual Activity  ? Alcohol use: No  ?  Alcohol/week: 0.0 standard drinks  ? Drug use: No  ? Sexual activity: Not on file  ?Other Topics Concern  ? Not on file  ?Social History Narrative  ? Lives with husband in [place]. quit smoking in 2021; smoked 40 years; no alcohol. Retd- office work/printing. No children.    ? ?Social Determinants of Health  ? ?Financial Resource Strain: Not on file  ?Food Insecurity: Not on file  ?Transportation Needs: Not on file  ?Physical Activity: Not on file  ?Stress: Not on file  ?Social Connections: Not on file  ?Intimate Partner Violence: Not on file  ? ? ?FAMILY HISTORY: ?Family History  ?Problem Relation Age of Onset  ? CAD Mother 28  ? Diabetes Father   ? Heart disease Father   ? CAD Brother 74  ? Throat cancer Paternal Uncle   ? Stomach cancer Maternal Grandfather   ? Breast cancer Cousin   ? ? ?ALLERGIES:  is allergic to betadine [povidone iodine], contrast media [iodinated contrast media], iodine, metrizamide, povidone-iodine, shellfish allergy, and hydrocodone-acetaminophen. ? ?MEDICATIONS:  ?Current Outpatient Medications  ?Medication Sig Dispense Refill  ? apixaban (ELIQUIS) 5 MG TABS tablet Take 1 tablet (5 mg total) by mouth 2 (two) times daily. 60 tablet 0  ? beta carotene w/minerals (OCUVITE) tablet Take 1 tablet by mouth daily.    ? diltiazem (CARDIZEM CD) 240 MG 24 hr capsule Take 1 capsule (240 mg total) by mouth daily. 30 capsule  0  ? docusate sodium (COLACE) 100 MG capsule Take 100 mg by mouth daily as needed for mild constipation.    ? DULoxetine (CYMBALTA) 60 MG capsule Take 60 mg by mouth daily.    ? EPINEPHrine 0.3 mg/0.3 mL IJ SOAJ injection Inject 0.3 mg into the muscle as needed for anaphylaxis.    ? Evolocumab (REPATHA SURECLICK) 010 MG/ML SOAJ Inject 140 mg into the skin every 14 (fourteen) days.    ? Exenatide ER (BYDUREON) 2 MG PEN Inject 2 mg into the skin every Tuesday.    ? fesoterodine (TOVIAZ) 4 MG TB24 tablet Take 4 mg by mouth daily.    ? furosemide (LASIX) 20 MG tablet Take 1 tablet (20 mg total) by mouth 2 (two) times daily. (Patient taking differently: Take 20 mg by mouth daily.) 30 tablet 1  ? JARDIANCE 25 MG TABS tablet     ? losartan (COZAAR) 50 MG tablet Take by mouth.    ? metFORMIN (GLUCOPHAGE) 1000 MG tablet Take 1,000 mg by mouth daily with breakfast.     ?  sotalol (BETAPACE) 80 MG tablet Take 1 tablet (80 mg total) by mouth every 12 (twelve) hours. 60 tablet 0  ? valACYclovir (VALTREX) 1000 MG tablet Take 1,000 mg by mouth daily.    ? ?No current facility-administered medications for this visit.  ? ? ?  ?. ? ?PHYSICAL EXAMINATION: ?ECOG PERFORMANCE STATUS: 0 - Asymptomatic ? ?Vitals:  ? 11/11/21 0917  ?BP: 119/66  ?Pulse: 81  ?Temp: 99.1 ?F (37.3 ?C)  ?SpO2: 96%  ? ?Filed Weights  ? 11/11/21 0917  ?Weight: 260 lb (117.9 kg)  ? ? ?Physical Exam ?Vitals and nursing note reviewed.  ?HENT:  ?   Head: Normocephalic and atraumatic.  ?   Mouth/Throat:  ?   Pharynx: Oropharynx is clear.  ?Eyes:  ?   Extraocular Movements: Extraocular movements intact.  ?   Pupils: Pupils are equal, round, and reactive to light.  ?Cardiovascular:  ?   Rate and Rhythm: Normal rate and regular rhythm.  ?Pulmonary:  ?   Comments: Decreased breath sounds bilaterally.  ?Abdominal:  ?   Palpations: Abdomen is soft.  ?Musculoskeletal:     ?   General: Normal range of motion.  ?   Cervical back: Normal range of motion.  ?Skin: ?   General: Skin  is warm.  ?Neurological:  ?   General: No focal deficit present.  ?   Mental Status: She is alert and oriented to person, place, and time.  ?Psychiatric:     ?   Behavior: Behavior normal.     ?   Judgment: Judgme

## 2021-11-11 NOTE — Progress Notes (Signed)
Met with patient during initial consult with Dr. Rogue Bussing to discuss recent imaging findings. All questions answered during visit. Informed pt that she will be called after tumor board discussion on 11/17/21. Contact info given and instructed to call with any questions or needs. Pt verbalized understanding. ?

## 2021-11-11 NOTE — Assessment & Plan Note (Addendum)
#  Left lower lobe 10.5 mm lung nodule [lung cancer screening; April 2023; not present in April 2022 CT scan]-concerning for malignancy.  However PET scan does not have any significant uptake at this time.   ? ?#I had long discussion that based on imaging findings concerning for malignancy-based on CT scan; however not very convincing on PET scan..  Discussed the only way to confirm malignancy or to refute malignancy would be biopsy.  Given the small lesion; location; and the negative PET-options include a follow-up imaging in 3 months or so versus a biopsy.  Discussed that we will review the imaging at the tumor conference next week.  And will reach out to patient/family regarding further plan of care.   ? ?# A.fib/acute decompensation [feb 2023-Dr.Khan]- on Eliquis.  Clinically stable. ? ?# COPD- no inhalers. ? ?Thank you Dr.Shaw for allowing me to participate in the care of your pleasant patient. Please do not hesitate to contact me with questions or concerns in the interim.  Discussed with Hildred Alamin. ? ?# DISPOSITION: ?# follow up TBD- Dr.B ? ?# I reviewed the blood work- with the patient in detail; also reviewed the imaging independently [as summarized above]; and with the patient in detail.  ? ?Cc; Dr.Khan ?

## 2021-11-17 ENCOUNTER — Other Ambulatory Visit: Payer: Self-pay

## 2021-11-17 ENCOUNTER — Other Ambulatory Visit: Payer: Medicare Other

## 2021-11-17 DIAGNOSIS — R911 Solitary pulmonary nodule: Secondary | ICD-10-CM

## 2021-11-17 NOTE — Progress Notes (Signed)
Tumor Board Documentation ? ?Tammy [name] was presented by Dr Rogue Bussing at our Tumor Board on 11/17/2021, which included representatives from medical oncology, radiology, pathology, pharmacy, surgical, pulmonology, genetics, research, navigation, radiation oncology, internal medicine, palliative care. ? ?[name] currently presents as a current patient, for discussion with history of the following treatments: active survellience. ? ?Additionally, we reviewed previous medical and familial history, history of present illness, and recent lab results along with all available histopathologic and imaging studies. The tumor board considered available treatment options and made the following recommendations: ?  ?  ? ?The following procedures/referrals were also placed: No orders of the defined types were placed in this encounter. ? ? ?Clinical Trial Status: not discussed  ? ?Staging used:   ? ?National site-specific guidelines   were discussed with respect to the case. ? ?Tumor board is a meeting of clinicians from various specialty areas who evaluate and discuss patients for whom a multidisciplinary approach is being considered. Final determinations in the plan of care are those of the provider(s). The responsibility for follow up of recommendations given during tumor board is that of the provider.  ? ?Today?s extended care, comprehensive team conference, Jahzaria was not present for the discussion and was not examined.  ? ?Multidisciplinary Tumor Board is a multidisciplinary case peer review process.  Decisions discussed in the Multidisciplinary Tumor Board reflect the opinions of the specialists present at the conference without having examined the patient.  Ultimately, treatment and diagnostic decisions rest with the primary provider(s) and the patient. ? ?

## 2021-11-18 DIAGNOSIS — Z20822 Contact with and (suspected) exposure to covid-19: Secondary | ICD-10-CM | POA: Diagnosis not present

## 2021-11-23 ENCOUNTER — Encounter: Payer: Self-pay | Admitting: *Deleted

## 2021-11-23 NOTE — Progress Notes (Signed)
Per Dr. Jacinto Reap, pt will require active surveillance with pulmonary to follow up on lung nodule. Referral previously placed by clinic staff. Pt scheduled with Dr. Patsey Berthold for initial consult on 12/20/21. Nothing further needed at this time.  ?

## 2021-11-25 DIAGNOSIS — M7551 Bursitis of right shoulder: Secondary | ICD-10-CM | POA: Diagnosis not present

## 2021-11-25 DIAGNOSIS — M4326 Fusion of spine, lumbar region: Secondary | ICD-10-CM | POA: Diagnosis not present

## 2021-11-25 DIAGNOSIS — Z6841 Body Mass Index (BMI) 40.0 and over, adult: Secondary | ICD-10-CM | POA: Diagnosis not present

## 2021-11-30 ENCOUNTER — Inpatient Hospital Stay: Payer: Medicare Other

## 2021-12-02 DIAGNOSIS — I10 Essential (primary) hypertension: Secondary | ICD-10-CM | POA: Diagnosis not present

## 2021-12-02 DIAGNOSIS — E119 Type 2 diabetes mellitus without complications: Secondary | ICD-10-CM | POA: Diagnosis not present

## 2021-12-02 DIAGNOSIS — M170 Bilateral primary osteoarthritis of knee: Secondary | ICD-10-CM | POA: Diagnosis not present

## 2021-12-20 ENCOUNTER — Ambulatory Visit (INDEPENDENT_AMBULATORY_CARE_PROVIDER_SITE_OTHER): Payer: Medicare Other | Admitting: Pulmonary Disease

## 2021-12-20 ENCOUNTER — Encounter: Payer: Self-pay | Admitting: Pulmonary Disease

## 2021-12-20 VITALS — BP 100/60 | HR 79 | Temp 98.0°F | Ht 60.0 in | Wt 254.0 lb

## 2021-12-20 DIAGNOSIS — E1129 Type 2 diabetes mellitus with other diabetic kidney complication: Secondary | ICD-10-CM | POA: Diagnosis not present

## 2021-12-20 DIAGNOSIS — J449 Chronic obstructive pulmonary disease, unspecified: Secondary | ICD-10-CM

## 2021-12-20 DIAGNOSIS — G720 Drug-induced myopathy: Secondary | ICD-10-CM | POA: Diagnosis not present

## 2021-12-20 DIAGNOSIS — R911 Solitary pulmonary nodule: Secondary | ICD-10-CM | POA: Diagnosis not present

## 2021-12-20 DIAGNOSIS — I2510 Atherosclerotic heart disease of native coronary artery without angina pectoris: Secondary | ICD-10-CM | POA: Diagnosis not present

## 2021-12-20 DIAGNOSIS — I10 Essential (primary) hypertension: Secondary | ICD-10-CM | POA: Diagnosis not present

## 2021-12-20 NOTE — Patient Instructions (Addendum)
We will schedule another CT of the chest in 3 months time.  Be done through the lung cancer screening program.  We are scheduling breathing tests.  We will see you in follow-up after that CT chest is done.

## 2021-12-20 NOTE — Progress Notes (Signed)
Subjective:    Patient ID: Tammy [name], female    DOB: 11-02-1950, 71 y.o.   MRN: [id number] Patient Care Team: Ginger Organ., MD as PCP - General (Internal Medicine) [name], MD as PCP - Cardiology (Cardiology) Telford Nab, RN as Oncology Nurse Navigator  Chief Complaint  Patient presents with  . PULMONARY CONSULT    PET 11/09/2021-SOB with exertion and night sweats    HPI    Review of Systems A 10 point review of systems was performed and it is as noted above otherwise negative.  Past Medical History:  Diagnosis Date  . Agatston coronary artery calcium score greater than 400   . Arrhythmia    atrial fibrillation  . CHF (congestive heart failure) ([place])   . Diabetes mellitus ([place])    x 3 years  . DJD (degenerative joint disease)   . Encephalitis   . Fibromyalgia   . HTN (hypertension)   . Hyperlipidemia   . Sleep apnea    No CPAP   Past Surgical History:  Procedure Laterality Date  . APPENDECTOMY    . BREAST CYST EXCISION    . BREAST EXCISIONAL BIOPSY Left 2004  . COLONOSCOPY WITH PROPOFOL N/A 11/26/2020   Procedure: COLONOSCOPY WITH PROPOFOL;  Surgeon: Carol Ada, MD;  [facility];  Service: Endoscopy;  Laterality: N/A;  . HEMOSTASIS CLIP PLACEMENT  11/26/2020   Procedure: HEMOSTASIS CLIP PLACEMENT;  Surgeon: Carol Ada, MD;  [facility];  Service: Endoscopy;;  . KNEE ARTHROSCOPY    . POLYPECTOMY  11/26/2020   Procedure: POLYPECTOMY;  Surgeon: Carol Ada, MD;  [facility];  Service: Endoscopy;;  . SUBMUCOSAL TATTOO INJECTION  11/26/2020   Procedure: SUBMUCOSAL TATTOO INJECTION;  Surgeon: Carol Ada, MD;  [facility];  Service: Endoscopy;;  . TEE WITHOUT CARDIOVERSION N/A 06/29/2020   Procedure: TRANSESOPHAGEAL ECHOCARDIOGRAM (TEE) with DCCV;  Surgeon: Dionisio David, MD;  Location: [facility];  Service: Cardiovascular;  Laterality: N/A;   Patient Active Problem List   Diagnosis Date  Noted  . Nodule of lower lobe of left lung 11/11/2021  . Fatigue 12/30/2020  . Elevated coronary artery calcium score 12/30/2020  . Unilateral primary osteoarthritis, left knee 10/06/2020  . Respiratory failure, acute ([place]) 06/28/2020  . Acute diastolic CHF (congestive heart failure) ([place]) 06/28/2020  . Atrial fibrillation with rapid ventricular response ([place]) 06/28/2020  . Acquired thrombophilia ([place])   . Depression   . Atrial fibrillation with RVR ([place]) 06/16/2020  . Diabetes mellitus ([place])   . HTN (hypertension)   . Sleep apnea   . Obesity, Class III, BMI 40-49.9 (morbid obesity) ([place])   . Chronic venous insufficiency 12/30/2018  . Varicose veins of both lower extremities with inflammation 12/30/2018  . DJD (degenerative joint disease) 12/30/2018  . Snoring 11/27/2018  . Daytime sleepiness 11/27/2018  . Educated about COVID-19 virus infection 11/27/2018  . SOB (shortness of breath) 11/27/2018  . Hyperlipidemia 05/20/2015  . Knee pain 06/06/2012   Family History  Problem Relation Age of Onset  . CAD Mother 39  . Diabetes Father   . Heart disease Father   . CAD Brother 86  . Throat cancer Paternal Uncle   . Stomach cancer Maternal Grandfather   . Breast cancer Cousin    Social History   Tobacco Use  . Smoking status: Former    Packs/day: 1.00    Years: 30.00    Pack years: 30.00    Types: Cigarettes    Quit  date: 11/01/2008    Years since quitting: 13.1  . Smokeless tobacco: Never  Substance Use Topics  . Alcohol use: No    Alcohol/week: 0.0 standard drinks   Allergies  Allergen Reactions  . Betadine [Povidone Iodine] Anaphylaxis  . Contrast Media [Iodinated Contrast Media] Anaphylaxis  . Iodine Anaphylaxis  . Metrizamide Anaphylaxis  . Povidone-Iodine Anaphylaxis  . Shellfish Allergy Anaphylaxis  . Hydrocodone-Acetaminophen Nausea Only   Current Meds  Medication Sig  . apixaban (ELIQUIS) 5 MG TABS tablet Take 1 tablet (5 mg total) by mouth 2 (two) times  daily.  . beta carotene w/minerals (OCUVITE) tablet Take 1 tablet by mouth daily.  Marland Kitchen diltiazem (CARDIZEM CD) 240 MG 24 hr capsule Take 1 capsule (240 mg total) by mouth daily.  Marland Kitchen docusate sodium (COLACE) 100 MG capsule Take 100 mg by mouth daily as needed for mild constipation.  Marland Kitchen EPINEPHrine 0.3 mg/0.3 mL IJ SOAJ injection Inject 0.3 mg into the muscle as needed for anaphylaxis.  . Evolocumab (REPATHA SURECLICK) 741 MG/ML SOAJ Inject 140 mg into the skin every 14 (fourteen) days.  . Exenatide ER (BYDUREON) 2 MG PEN Inject 2 mg into the skin every Tuesday.  . fesoterodine (TOVIAZ) 4 MG TB24 tablet Take 4 mg by mouth daily.  . furosemide (LASIX) 20 MG tablet Take 1 tablet (20 mg total) by mouth 2 (two) times daily. (Patient taking differently: Take 20 mg by mouth daily.)  . JARDIANCE 25 MG TABS tablet   . losartan (COZAAR) 50 MG tablet Take by mouth.  . metFORMIN (GLUCOPHAGE) 1000 MG tablet Take 1,000 mg by mouth daily with breakfast.   . sotalol (BETAPACE) 80 MG tablet Take 1 tablet (80 mg total) by mouth every 12 (twelve) hours.  . valACYclovir (VALTREX) 1000 MG tablet Take 1,000 mg by mouth daily.  . [DISCONTINUED] DULoxetine (CYMBALTA) 60 MG capsule Take 60 mg by mouth daily.   Immunization History  Administered Date(s) Administered  . Influenza-Unspecified 03/21/2021  . PFIZER Comirnaty(Gray Top)Covid-19 Tri-Sucrose Vaccine 10/03/2019, 10/31/2019       Objective:   Physical Exam BP 100/60 (BP Location: Left Arm, Cuff Size: Large)   Pulse 79   Temp 98 F (36.7 C) (Temporal)   Ht 5' (1.524 m)   Wt 254 lb (115.2 kg)   SpO2 96%   BMI 49.61 kg/m  GENERAL: HEAD: Normocephalic, atraumatic.  EYES: Pupils equal, round, reactive to light.  No scleral icterus.  MOUTH:  NECK: Supple. No thyromegaly. Trachea midline. No JVD.  No adenopathy. PULMONARY: Good air entry bilaterally.  No adventitious sounds. CARDIOVASCULAR: S1 and S2. Regular rate and rhythm.  ABDOMEN: MUSCULOSKELETAL: No  joint deformity, no clubbing, no edema.  NEUROLOGIC:  SKIN: Intact,warm,dry. PSYCH:       Assessment & Plan:

## 2021-12-21 ENCOUNTER — Telehealth: Payer: Self-pay | Admitting: Pulmonary Disease

## 2021-12-21 NOTE — Telephone Encounter (Signed)
Lets do 3 months from yesterday's appointment

## 2021-12-21 NOTE — Telephone Encounter (Signed)
Dr. Gonzalez, please see below message and advise. Thanks 

## 2021-12-21 NOTE — Telephone Encounter (Signed)
Patient had LCS CT done 10/26/21 and PET scan done 11/09/21. Dr. Patsey Berthold saw patient in office today and order Chest CT for 3 months. Should the CT be scheduled 3 months from PET scan or 3 months from today's appt

## 2021-12-22 NOTE — Telephone Encounter (Signed)
I made note of this on the CT order

## 2022-01-02 ENCOUNTER — Other Ambulatory Visit: Payer: Self-pay | Admitting: Internal Medicine

## 2022-01-02 DIAGNOSIS — Z1231 Encounter for screening mammogram for malignant neoplasm of breast: Secondary | ICD-10-CM

## 2022-01-06 DIAGNOSIS — M4326 Fusion of spine, lumbar region: Secondary | ICD-10-CM | POA: Diagnosis not present

## 2022-01-18 ENCOUNTER — Ambulatory Visit
Admission: RE | Admit: 2022-01-18 | Discharge: 2022-01-18 | Disposition: A | Discharge status: Home / Self Care | Payer: Medicare Other | Source: Ambulatory Visit | Attending: Internal Medicine | Admitting: Internal Medicine

## 2022-01-18 DIAGNOSIS — Z1231 Encounter for screening mammogram for malignant neoplasm of breast: Secondary | ICD-10-CM

## 2022-01-26 DIAGNOSIS — E669 Obesity, unspecified: Secondary | ICD-10-CM | POA: Diagnosis not present

## 2022-01-26 DIAGNOSIS — R002 Palpitations: Secondary | ICD-10-CM | POA: Diagnosis not present

## 2022-01-26 DIAGNOSIS — E668 Other obesity: Secondary | ICD-10-CM | POA: Diagnosis not present

## 2022-01-26 DIAGNOSIS — G4730 Sleep apnea, unspecified: Secondary | ICD-10-CM | POA: Diagnosis not present

## 2022-01-26 DIAGNOSIS — I10 Essential (primary) hypertension: Secondary | ICD-10-CM | POA: Diagnosis not present

## 2022-01-26 DIAGNOSIS — E782 Mixed hyperlipidemia: Secondary | ICD-10-CM | POA: Diagnosis not present

## 2022-01-26 DIAGNOSIS — I340 Nonrheumatic mitral (valve) insufficiency: Secondary | ICD-10-CM | POA: Diagnosis not present

## 2022-01-26 DIAGNOSIS — I4891 Unspecified atrial fibrillation: Secondary | ICD-10-CM | POA: Diagnosis not present

## 2022-02-14 DIAGNOSIS — Z800 Family history of malignant neoplasm of digestive organs: Secondary | ICD-10-CM | POA: Diagnosis not present

## 2022-02-14 DIAGNOSIS — K5730 Diverticulosis of large intestine without perforation or abscess without bleeding: Secondary | ICD-10-CM | POA: Diagnosis not present

## 2022-02-14 DIAGNOSIS — R635 Abnormal weight gain: Secondary | ICD-10-CM | POA: Diagnosis not present

## 2022-02-14 DIAGNOSIS — Z1211 Encounter for screening for malignant neoplasm of colon: Secondary | ICD-10-CM | POA: Diagnosis not present

## 2022-02-14 DIAGNOSIS — G4733 Obstructive sleep apnea (adult) (pediatric): Secondary | ICD-10-CM | POA: Diagnosis not present

## 2022-02-14 DIAGNOSIS — Z86010 Personal history of colonic polyps: Secondary | ICD-10-CM | POA: Diagnosis not present

## 2022-02-14 DIAGNOSIS — K5904 Chronic idiopathic constipation: Secondary | ICD-10-CM | POA: Diagnosis not present

## 2022-03-06 ENCOUNTER — Ambulatory Visit
Admission: RE | Admit: 2022-03-06 | Discharge: 2022-03-06 | Disposition: A | Discharge status: Home / Self Care | Payer: Medicare Other | Source: Ambulatory Visit | Attending: Pulmonary Disease | Admitting: Pulmonary Disease

## 2022-03-06 DIAGNOSIS — R911 Solitary pulmonary nodule: Secondary | ICD-10-CM

## 2022-03-06 DIAGNOSIS — I700 Atherosclerosis of aorta: Secondary | ICD-10-CM | POA: Diagnosis not present

## 2022-03-06 DIAGNOSIS — J439 Emphysema, unspecified: Secondary | ICD-10-CM | POA: Diagnosis not present

## 2022-03-08 ENCOUNTER — Other Ambulatory Visit: Payer: Self-pay

## 2022-03-08 DIAGNOSIS — R911 Solitary pulmonary nodule: Secondary | ICD-10-CM

## 2022-04-12 ENCOUNTER — Ambulatory Visit: Payer: Medicare Other | Attending: Pulmonary Disease

## 2022-04-14 ENCOUNTER — Ambulatory Visit: Payer: Medicare Other | Admitting: Pulmonary Disease

## 2022-04-18 DIAGNOSIS — I2510 Atherosclerotic heart disease of native coronary artery without angina pectoris: Secondary | ICD-10-CM | POA: Diagnosis not present

## 2022-04-18 DIAGNOSIS — I480 Paroxysmal atrial fibrillation: Secondary | ICD-10-CM | POA: Diagnosis not present

## 2022-04-18 DIAGNOSIS — I10 Essential (primary) hypertension: Secondary | ICD-10-CM | POA: Diagnosis not present

## 2022-04-18 DIAGNOSIS — J439 Emphysema, unspecified: Secondary | ICD-10-CM | POA: Diagnosis not present

## 2022-04-18 DIAGNOSIS — R296 Repeated falls: Secondary | ICD-10-CM | POA: Diagnosis not present

## 2022-04-18 DIAGNOSIS — I700 Atherosclerosis of aorta: Secondary | ICD-10-CM | POA: Diagnosis not present

## 2022-04-18 DIAGNOSIS — L603 Nail dystrophy: Secondary | ICD-10-CM | POA: Diagnosis not present

## 2022-04-18 DIAGNOSIS — E1149 Type 2 diabetes mellitus with other diabetic neurological complication: Secondary | ICD-10-CM | POA: Diagnosis not present

## 2022-04-18 DIAGNOSIS — E1129 Type 2 diabetes mellitus with other diabetic kidney complication: Secondary | ICD-10-CM | POA: Diagnosis not present

## 2022-04-18 DIAGNOSIS — R911 Solitary pulmonary nodule: Secondary | ICD-10-CM | POA: Diagnosis not present

## 2022-04-18 DIAGNOSIS — D6869 Other thrombophilia: Secondary | ICD-10-CM | POA: Diagnosis not present

## 2022-04-19 DIAGNOSIS — M25562 Pain in left knee: Secondary | ICD-10-CM | POA: Diagnosis not present

## 2022-04-19 DIAGNOSIS — M170 Bilateral primary osteoarthritis of knee: Secondary | ICD-10-CM | POA: Diagnosis not present

## 2022-04-19 DIAGNOSIS — M25561 Pain in right knee: Secondary | ICD-10-CM | POA: Diagnosis not present

## 2022-04-20 ENCOUNTER — Ambulatory Visit: Payer: Medicare Other | Attending: Pulmonary Disease

## 2022-04-20 DIAGNOSIS — Z87891 Personal history of nicotine dependence: Secondary | ICD-10-CM | POA: Insufficient documentation

## 2022-04-20 DIAGNOSIS — R0600 Dyspnea, unspecified: Secondary | ICD-10-CM | POA: Diagnosis not present

## 2022-04-20 DIAGNOSIS — J449 Chronic obstructive pulmonary disease, unspecified: Secondary | ICD-10-CM | POA: Diagnosis not present

## 2022-04-20 LAB — PULMONARY FUNCTION TEST ARMC ONLY
DL/VA % pred: 110 %
DL/VA: 4.73 ml/min/mmHg/L
DLCO unc % pred: 121 %
DLCO unc: 20.51 ml/min/mmHg
FEF 25-75 Post: 0.77 L/sec
FEF 25-75 Pre: 0.94 L/sec
FEF2575-%Change-Post: -17 %
FEF2575-%Pred-Post: 46 %
FEF2575-%Pred-Pre: 55 %
FEV1-%Change-Post: -2 %
FEV1-%Pred-Post: 75 %
FEV1-%Pred-Pre: 77 %
FEV1-Post: 1.43 L
FEV1-Pre: 1.48 L
FEV1FVC-%Change-Post: 1 %
FEV1FVC-%Pred-Pre: 84 %
FEV6-%Change-Post: -4 %
FEV6-%Pred-Post: 92 %
FEV6-%Pred-Pre: 96 %
FEV6-Post: 2.22 L
FEV6-Pre: 2.32 L
FEV6FVC-%Change-Post: 0 %
FEV6FVC-%Pred-Post: 104 %
FEV6FVC-%Pred-Pre: 104 %
FVC-%Change-Post: -4 %
FVC-%Pred-Post: 88 %
FVC-%Pred-Pre: 91 %
FVC-Post: 2.22 L
FVC-Pre: 2.32 L
Post FEV1/FVC ratio: 65 %
Post FEV6/FVC ratio: 100 %
Pre FEV1/FVC ratio: 64 %
Pre FEV6/FVC Ratio: 100 %
RV % pred: 130 %
RV: 2.59 L
TLC % pred: 115 %
TLC: 5.16 L

## 2022-04-20 MED ORDER — ALBUTEROL SULFATE (2.5 MG/3ML) 0.083% IN NEBU
2.5000 mg | INHALATION_SOLUTION | Freq: Once | RESPIRATORY_TRACT | Status: AC
  Administered 2022-04-20: 2.5 mg via RESPIRATORY_TRACT
  Filled 2022-04-20: qty 3

## 2022-04-26 DIAGNOSIS — Z961 Presence of intraocular lens: Secondary | ICD-10-CM | POA: Diagnosis not present

## 2022-04-26 DIAGNOSIS — E119 Type 2 diabetes mellitus without complications: Secondary | ICD-10-CM | POA: Diagnosis not present

## 2022-04-26 DIAGNOSIS — M25561 Pain in right knee: Secondary | ICD-10-CM | POA: Diagnosis not present

## 2022-04-26 DIAGNOSIS — M170 Bilateral primary osteoarthritis of knee: Secondary | ICD-10-CM | POA: Diagnosis not present

## 2022-04-26 NOTE — Therapy (Signed)
OUTPATIENT PHYSICAL THERAPY FUNCTIONAL EVALUATION   Patient Name: Tammy [name] MRN: [id number] DOB:20-Aug-1950, 71 y.o., female Today's Date: 04/28/2022   PT End of Session - 04/27/22 1547     Visit Number 1    Number of Visits 13    Date for PT Re-Evaluation 06/22/22    Authorization Type Medicare AB    Authorization Time Period foto v6 and 10, kx mod 15    Progress Note Due on Visit 10    PT Start Time 1548    PT Stop Time 1950    PT Time Calculation (min) 45 min    Activity Tolerance Patient tolerated treatment well    Behavior During Therapy WFL for tasks assessed/performed             Past Medical History:  Diagnosis Date   Agatston coronary artery calcium score greater than 400    Arrhythmia    atrial fibrillation   CHF (congestive heart failure) (HCC)    Diabetes mellitus ([place])    x 3 years   DJD (degenerative joint disease)    Encephalitis    Fibromyalgia    HTN (hypertension)    Hyperlipidemia    Sleep apnea    No CPAP   Past Surgical History:  Procedure Laterality Date   APPENDECTOMY     BREAST CYST EXCISION     BREAST EXCISIONAL BIOPSY Left 2004   COLONOSCOPY WITH PROPOFOL N/A 11/26/2020   Procedure: COLONOSCOPY WITH PROPOFOL;  Surgeon: Carol Ada, MD;  Location: [facility];  Service: Endoscopy;  Laterality: N/A;   HEMOSTASIS CLIP PLACEMENT  11/26/2020   Procedure: HEMOSTASIS CLIP PLACEMENT;  Surgeon: Carol Ada, MD;  Location: [facility];  Service: Endoscopy;;   KNEE ARTHROSCOPY     POLYPECTOMY  11/26/2020   Procedure: POLYPECTOMY;  Surgeon: Carol Ada, MD;  [facility]: [facility];  Service: Endoscopy;;   SUBMUCOSAL TATTOO INJECTION  11/26/2020   Procedure: SUBMUCOSAL TATTOO INJECTION;  Surgeon: Carol Ada, MD;  [facility]: [facility];  Service: Endoscopy;;   TEE WITHOUT CARDIOVERSION N/A 06/29/2020   Procedure: TRANSESOPHAGEAL ECHOCARDIOGRAM (TEE) with DCCV;  Surgeon: Dionisio Gabino Hagin, MD;  Location: [facility];  Service:  Cardiovascular;  Laterality: N/A;   Patient Active Problem List   Diagnosis Date Noted   COPD suggested by initial evaluation ([place]) 04/20/2022   Nodule of lower lobe of left lung 11/11/2021   Fatigue 12/30/2020   Elevated coronary artery calcium score 12/30/2020   Unilateral primary osteoarthritis, left knee 10/06/2020   Respiratory failure, acute ([place]) 93/26/7124   Acute diastolic CHF (congestive heart failure) ([place]) 06/28/2020   Atrial fibrillation with rapid ventricular response ([place]) 06/28/2020   Acquired thrombophilia ([place])    Depression    Atrial fibrillation with RVR ([place]) 06/16/2020   Diabetes mellitus ([place])    HTN (hypertension)    Sleep apnea    Obesity, Class III, BMI 40-49.9 (morbid obesity) ([place])    Chronic venous insufficiency 12/30/2018   Varicose veins of both lower extremities with inflammation 12/30/2018   DJD (degenerative joint disease) 12/30/2018   Snoring 11/27/2018   Daytime sleepiness 11/27/2018   Educated about COVID-19 virus infection 11/27/2018   SOB (shortness of breath) 11/27/2018   Hyperlipidemia 05/20/2015   Knee pain 06/06/2012    PCP: Ginger Organ MD  REFERRING PROVIDER: Ginger Organ MD  REFERRING DIAG: Repeated falls   Rationale for Evaluation and Treatment Rehabilitation  THERAPY DIAG:  Other abnormalities of gait and mobility  Repeated falls  Muscle weakness (generalized)  ONSET DATE: Gradual last couple years, worsening over recent   SUBJECTIVE:                                                                                                                                                                                           SUBJECTIVE STATEMENT: Pt reports gradual decline in function/mobility over past two years since the passing of her mother, for whom she was primary caregiver. Pt states decline has been most notable in past month or so, in which she has begun to require the use of cane or rollator for  ambulation. Multiple falls that she attributes to tripping, although she notes balance is worse on uneven surfaces and incline/declines. Denies any dizziness, passing out, or LE weakness. Denies any overt activity tolerance deficits, states Afib is well controlled and she does not have cardiac symptoms. Has noticed some gradual L hand weakness and intermittent resting tremor RUE over past several months, she states her MD is aware. Pt states prior to her mother's passing she was fairly active, performing household duties and yardwork, which she is now unable to perform due to balance deficits.   PERTINENT HISTORY:   HTN, venous insufficiency, AfibRVR, depression, obesity    PRECAUTIONS: Fall, cardiac hx  WEIGHT BEARING RESTRICTIONS: No  FALLS:  Has patient fallen in last 6 months? Yes. Number of falls 4 months (pt states she tripped over things)  LIVING ENVIRONMENT: Lives with: lives with their spouse Lives in: House/apartment 3 levels Stairs: has stair lift, denies need for stair navigation aside from 2+1STE w 1 rail Has following equipment at home: Single point cane and Walker - 4 wheeled  OCCUPATION: retired  PLOF: Independent  PATIENT GOALS: be able to walk without assistance, navigate community better   OBJECTIVE:   DIAGNOSTIC FINDINGS:  None recently  PATIENT SURVEYS:  FOTO 38%  COGNITION:  Overall cognitive status: Within functional limits for tasks assessed     SENSATION/NEURO: Light touch intact all extremities Resting tremors RUE, pt reports also intention tremor although no ataxia on finger<>chin testing (pt states present about 6 months and MD is aware) Increased difficulty with serial opposition RUE compared to L Unremarkable dysdiadochokinesia testing   POSTURE: rounded shoulders, forward head, and decreased lumbar lordosis    LOWER EXTREMITY MMT:    MMT Right eval Left eval  Hip flexion 5 5  Hip abduction (modified sitting) 5 5  Hip internal  rotation    Hip external rotation    Knee flexion 5 5  Knee extension 4+ 4+   (Blank rows = not tested)  Comments: grip strength mildly reduced  on L compared to R but both intact    FUNCTIONAL TESTS:  5 times sit to stand: 16sec from standard chair no UE support Functional gait assessment: 15/30; use of assistive device (heavy reliance noted). Difficulty w/ narrow BOS walking, changes in gait speed, increase time for turning. Baseline gait impairments as below  GAIT: Distance walked: within clinic Assistive device utilized: Single point cane Level of assistance: Modified independence Comments: somewhat widened BOS, reduced step length B, reduced truncal rotation, increased lateral weight shifting    TODAY'S TREATMENT:  [facility] Adult PT Treatment:                                                                                                                            DATE: 04/27/2022  Therapeutic exercise STS 3x5 Hip extensions at counter x10 each LE   PATIENT EDUCATION:  Education details: Pt education on PT impairments, prognosis, and POC; examination findings as they relate to balance. Rationale for interventions, safe/appropriate HEP performance, fall risk reduction strategies. Person educated: Patient Education method: Explanation, Demonstration, Tactile cues, Verbal cues, and Handouts Education comprehension: verbalized understanding, returned demonstration, verbal cues required, tactile cues required, and needs further education    HOME EXERCISE PROGRAM: Access Code: [email or web address] URL: [email or web address] Date: 04/27/2022 Prepared by: Enis Slipper  Exercises - Sit to Stand with Armchair  - 1 x daily - 7 x weekly - 3 sets - 5 reps - Standing Hip Extension with Counter Support  - 1 x daily - 7 x weekly - 3 sets - 10 reps  ASSESSMENT:  CLINICAL IMPRESSION: Pt is a 71 year old woman who arrives to PT evaluation on this date for repeated falls. Pt endorses  gradual functional decline over past two years, more notable over past month or so, with increased imbalance and difficulty performing daily activities.  During today's session pt demonstrates limitations in dynamic postural stability as evidenced by FGA score of 15/30 which are limiting ability to perform aforementioned activities. Pt also performs 5xSTS in 16 seconds which is indicative of fall risk (cutoff score 12-14sec varying by population). Pt does not demonstrate any overt weakness aside from mild reduction in L grip strength compared to R - she is noted to have resting tremor RUE and mild impairment in serial opposition RUE. Pt tolerates session well, able to perform HEP appropriately with initial cueing. Recommend skilled PT to address aforementioned deficits to improve functional independence/tolerance and reduce fall risk.  Pt departs today's session in no acute distress, all voiced questions/concerns addressed appropriately from PT perspective.     OBJECTIVE IMPAIRMENTS: Abnormal gait, decreased activity tolerance, decreased balance, decreased coordination, decreased endurance, decreased mobility, difficulty walking, decreased strength, improper body mechanics, postural dysfunction, and obesity.   ACTIVITY LIMITATIONS: carrying, lifting, bending, standing, squatting, stairs, and transfers  PARTICIPATION LIMITATIONS: meal prep, cleaning, laundry, shopping, community activity, and yard work  PERSONAL FACTORS: Age and Fitness are also affecting patient's functional outcome.   REHAB POTENTIAL: Good  CLINICAL DECISION MAKING: Evolving/moderate complexity  EVALUATION COMPLEXITY: Moderate   GOALS: Goals reviewed with patient? No  SHORT TERM GOALS: Target date: 05/25/2022  Pt will demonstrate appropriate understanding and performance of initially prescribed HEP in order to facilitate improved independence with management of symptoms.  Baseline: HEP provided on eval Goal status: INITIAL    2. Pt will score greater than or equal to 44% on FOTO in order to demonstrate improved perception of function due to symptoms.  Baseline: 38%  Goal status: INITIAL   LONG TERM GOALS: Target date: 06/22/2022  Pt will score 50% on FOTO in order to demonstrate improved perception of functional status due to symptoms.  Baseline: 38% Goal status: INITIAL  2.  Pt will score greater than or equal to 22/30 on Functional Gait assessment in order to indicate reduced fall risk (cutoff score </= 22/30 predictive of falls per Ernestine Conrad et al 2010, MCID 4 pts Beninato et al 2014)  Baseline: 15/30  Goal status: INITIAL   4. Pt will perform 5xSTS in <13 sec in order to demonstrate reduced fall risk and improved functional independence. (MCID of 2.3sec)  Baseline: 16sec standard chair no UE support  Goal status: INITIAL    PLAN: PT FREQUENCY: 2x/week  PT DURATION: 6 weeks  PLANNED INTERVENTIONS: Therapeutic exercises, Therapeutic activity, Neuromuscular re-education, Balance training, Gait training, Patient/Family education, Self Care, Stair training, Vestibular training, DME instructions, Aquatic Therapy, Manual therapy, and Re-evaluation.  PLAN FOR NEXT SESSION: strength/balance, monitor neuro   Leeroy Cha, PT 04/28/2022, 8:29 AM

## 2022-04-27 ENCOUNTER — Encounter: Payer: Self-pay | Admitting: Physical Therapy

## 2022-04-27 ENCOUNTER — Ambulatory Visit: Payer: Medicare Other | Attending: Internal Medicine | Admitting: Physical Therapy

## 2022-04-27 ENCOUNTER — Other Ambulatory Visit: Payer: Self-pay

## 2022-04-27 DIAGNOSIS — R2689 Other abnormalities of gait and mobility: Secondary | ICD-10-CM | POA: Insufficient documentation

## 2022-04-27 DIAGNOSIS — R296 Repeated falls: Secondary | ICD-10-CM | POA: Insufficient documentation

## 2022-04-27 DIAGNOSIS — M6281 Muscle weakness (generalized): Secondary | ICD-10-CM | POA: Diagnosis not present

## 2022-05-01 NOTE — Therapy (Signed)
OUTPATIENT PHYSICAL THERAPY TREATMENT NOTE   Patient Name: Tammy [name] MRN: [id number] DOB:Dec 20, 1950, 71 y.o., female Today's Date: 05/02/2022  PCP: Ginger Organ MD   REFERRING PROVIDER: Ginger Organ MD  END OF SESSION:   PT End of Session - 05/02/22 1549     Visit Number 2    Number of Visits 13    Date for PT Re-Evaluation 06/22/22    Authorization Type Medicare AB    Authorization Time Period foto v6 and 10, kx mod 15    Progress Note Due on Visit 10    PT Start Time 1548    PT Stop Time 1629    PT Time Calculation (min) 41 min    Activity Tolerance Patient tolerated treatment well    Behavior During Therapy WFL for tasks assessed/performed             Past Medical History:  Diagnosis Date   Agatston coronary artery calcium score greater than 400    Arrhythmia    atrial fibrillation   CHF (congestive heart failure) ([place])    Diabetes mellitus ([place])    x 3 years   DJD (degenerative joint disease)    Encephalitis    Fibromyalgia    HTN (hypertension)    Hyperlipidemia    Sleep apnea    No CPAP   Past Surgical History:  Procedure Laterality Date   APPENDECTOMY     BREAST CYST EXCISION     BREAST EXCISIONAL BIOPSY Left 2004   COLONOSCOPY WITH PROPOFOL N/A 11/26/2020   Procedure: COLONOSCOPY WITH PROPOFOL;  Surgeon: Carol Ada, MD;  [facility];  Service: Endoscopy;  Laterality: N/A;   HEMOSTASIS CLIP PLACEMENT  11/26/2020   Procedure: HEMOSTASIS CLIP PLACEMENT;  Surgeon: Carol Ada, MD;  [facility];  Service: Endoscopy;;   KNEE ARTHROSCOPY     POLYPECTOMY  11/26/2020   Procedure: POLYPECTOMY;  Surgeon: Carol Ada, MD;  [facility];  Service: Endoscopy;;   SUBMUCOSAL TATTOO INJECTION  11/26/2020   Procedure: SUBMUCOSAL TATTOO INJECTION;  Surgeon: Carol Ada, MD;  [facility];  Service: Endoscopy;;   TEE WITHOUT CARDIOVERSION N/A 06/29/2020   Procedure: TRANSESOPHAGEAL  ECHOCARDIOGRAM (TEE) with DCCV;  Surgeon: Dionisio Sybilla Malhotra, MD;  Location: [facility];  Service: Cardiovascular;  Laterality: N/A;   Patient Active Problem List   Diagnosis Date Noted   COPD suggested by initial evaluation ([place]) 04/20/2022   Nodule of lower lobe of left lung 11/11/2021   Fatigue 12/30/2020   Elevated coronary artery calcium score 12/30/2020   Unilateral primary osteoarthritis, left knee 10/06/2020   Respiratory failure, acute ([place]) 73/71/0626   Acute diastolic CHF (congestive heart failure) ([place]) 06/28/2020   Atrial fibrillation with rapid ventricular response ([place]) 06/28/2020   Acquired thrombophilia ([place])    Depression    Atrial fibrillation with RVR ([place]) 06/16/2020   Diabetes mellitus ([place])    HTN (hypertension)    Sleep apnea    Obesity, Class III, BMI 40-49.9 (morbid obesity) ([place])    Chronic venous insufficiency 12/30/2018   Varicose veins of both lower extremities with inflammation 12/30/2018   DJD (degenerative joint disease) 12/30/2018   Snoring 11/27/2018   Daytime sleepiness 11/27/2018   Educated about COVID-19 virus infection 11/27/2018   SOB (shortness of breath) 11/27/2018   Hyperlipidemia 05/20/2015   Knee pain 06/06/2012    REFERRING DIAG: Repeated falls  THERAPY DIAG:  Repeated falls  Other abnormalities of gait and mobility  Muscle weakness (generalized)  Rationale for Evaluation and Treatment Rehabilitation  PERTINENT HISTORY: HTN, venous insufficiency, AfibRVR, depression, obesity  PRECAUTIONS: Fall, cardiac hx  SUBJECTIVE:  Pt arrives with no new complaints, states she has not had any new falls. Reports good compliance with HEP    OBJECTIVE: (objective measures completed at initial evaluation unless otherwise dated)   DIAGNOSTIC FINDINGS:  None recently   PATIENT SURVEYS:  FOTO 38%   COGNITION:           Overall cognitive status: Within functional limits for tasks assessed                           SENSATION/NEURO: Light touch intact all extremities Resting tremors RUE, pt reports also intention tremor although no ataxia on finger<>chin testing (pt states present about 6 months and MD is aware) Increased difficulty with serial opposition RUE compared to L Unremarkable dysdiadochokinesia testing     POSTURE: rounded shoulders, forward head, and decreased lumbar lordosis       LOWER EXTREMITY MMT:     MMT Right eval Left eval  Hip flexion 5 5  Hip abduction (modified sitting) 5 5  Hip internal rotation      Hip external rotation      Knee flexion 5 5  Knee extension 4+ 4+   (Blank rows = not tested)   Comments: grip strength mildly reduced on L compared to R but both intact       FUNCTIONAL TESTS:  5 times sit to stand: 16sec from standard chair no UE support Functional gait assessment: 15/30; use of assistive device (heavy reliance noted). Difficulty w/ narrow BOS walking, changes in gait speed, increase time for turning. Baseline gait impairments as below   GAIT: Distance walked: within clinic Assistive device utilized: Single point cane Level of assistance: Modified independence Comments: somewhat widened BOS, reduced step length B, reduced truncal rotation, increased lateral weight shifting       TODAY'S TREATMENT:  [facility] Adult PT Treatment:                                      DATE: 05/01/22 Therapeutic Exercise: STS 2x5, x5 w 5# DB 2x5 cues for form and BOS Hip extensions x15 each LE at counter Heel raises 2x12 w B UE Neuromuscular re-ed: Obstacle course - airex, 2inch step, 4 cone weaving, blue block stepovers, 4 laps CGA w/ gait belt Cone marches 2x10 B LE for improved single limb stance and sagittal plane activation HHA   [facility] Adult PT Treatment:                                                                                                                            DATE: 04/27/2022  Therapeutic exercise STS 3x5 Hip extensions at counter x10 each  LE     PATIENT EDUCATION:  Education details:  education on safe HEP performance, rationale for interventions throughout Person educated: Patient Education method: Explanation, Demonstration, Tactile cues, Verbal cues Education comprehension: verbalized understanding, returned demonstration, verbal cues required, tactile cues required, and needs further education      HOME EXERCISE PROGRAM: Access Code: [email or web address] URL: [email or web address] Date: 04/27/2022 Prepared by: Enis Slipper   Exercises - Sit to Stand with Armchair  - 1 x daily - 7 x weekly - 3 sets - 5 reps - Standing Hip Extension with Counter Support  - 1 x daily - 7 x weekly - 3 sets - 10 reps   ASSESSMENT:   CLINICAL IMPRESSION: Pt is a 71 year old woman who arrives to PT session on this date for repeated falls. Pt arrives with report of good HEP compliance, no new updates since initial eval. Session initiated with LE program which pt tolerates well, min cues for improved mechanics with standing hip extensions. Followed with obstacle course for improved dynamic postural stability - initial instability requiring CGA-minA but improves with repetition (CGA overall), primary persisting difficulty is with clearance of raised obstacles. Ended session with cone taps for improved sagittal plane activation, clearance, and lateral weight shifting/stability. Pt departs with report of "feeling great", no adverse symptoms, reinforced HEP. Pt departs today's session in no acute distress, all voiced questions/concerns addressed appropriately from PT perspective.        OBJECTIVE IMPAIRMENTS: Abnormal gait, decreased activity tolerance, decreased balance, decreased coordination, decreased endurance, decreased mobility, difficulty walking, decreased strength, improper body mechanics, postural dysfunction, and obesity.    ACTIVITY LIMITATIONS: carrying, lifting, bending, standing, squatting, stairs, and transfers   PARTICIPATION  LIMITATIONS: meal prep, cleaning, laundry, shopping, community activity, and yard work   PERSONAL FACTORS: Age and Fitness are also affecting patient's functional outcome.    REHAB POTENTIAL: Good   CLINICAL DECISION MAKING: Evolving/moderate complexity   EVALUATION COMPLEXITY: Moderate     GOALS: Goals reviewed with patient? No   SHORT TERM GOALS: Target date: 05/25/2022   Pt will demonstrate appropriate understanding and performance of initially prescribed HEP in order to facilitate improved independence with management of symptoms.  Baseline: HEP provided on eval Goal status: INITIAL    2. Pt will score greater than or equal to 44% on FOTO in order to demonstrate improved perception of function due to symptoms.            Baseline: 38%            Goal status: INITIAL    LONG TERM GOALS: Target date: 06/22/2022   Pt will score 50% on FOTO in order to demonstrate improved perception of functional status due to symptoms.  Baseline: 38% Goal status: INITIAL   2.  Pt will score greater than or equal to 22/30 on Functional Gait assessment in order to indicate reduced fall risk (cutoff score </= 22/30 predictive of falls per Ernestine Conrad et al 2010, MCID 4 pts Beninato et al 2014)            Baseline: 15/30            Goal status: INITIAL    4. Pt will perform 5xSTS in <13 sec in order to demonstrate reduced fall risk and improved functional independence. (MCID of 2.3sec)            Baseline: 16sec standard chair no UE support            Goal status: INITIAL      PLAN: PT FREQUENCY: 2x/week   PT DURATION:  6 weeks   PLANNED INTERVENTIONS: Therapeutic exercises, Therapeutic activity, Neuromuscular re-education, Balance training, Gait training, Patient/Family education, Self Care, Stair training, Vestibular training, DME instructions, Aquatic Therapy, Manual therapy, and Re-evaluation.   PLAN FOR NEXT SESSION: continue with strength/balance program with emphasis on improved clearance,  lateral weight shifting, and dual tasking   Leeroy Cha PT, DPT 05/02/2022 5:26 PM

## 2022-05-02 ENCOUNTER — Ambulatory Visit: Payer: Medicare Other | Admitting: Physical Therapy

## 2022-05-02 ENCOUNTER — Encounter: Payer: Self-pay | Admitting: Physical Therapy

## 2022-05-02 DIAGNOSIS — R2689 Other abnormalities of gait and mobility: Secondary | ICD-10-CM | POA: Diagnosis not present

## 2022-05-02 DIAGNOSIS — M6281 Muscle weakness (generalized): Secondary | ICD-10-CM

## 2022-05-02 DIAGNOSIS — R296 Repeated falls: Secondary | ICD-10-CM | POA: Diagnosis not present

## 2022-05-03 DIAGNOSIS — M170 Bilateral primary osteoarthritis of knee: Secondary | ICD-10-CM | POA: Diagnosis not present

## 2022-05-03 DIAGNOSIS — M25562 Pain in left knee: Secondary | ICD-10-CM | POA: Diagnosis not present

## 2022-05-03 DIAGNOSIS — M25561 Pain in right knee: Secondary | ICD-10-CM | POA: Diagnosis not present

## 2022-05-03 NOTE — Therapy (Addendum)
OUTPATIENT PHYSICAL THERAPY TREATMENT NOTE   Patient Name: Tammy [name] MRN: [id number] DOB:03/01/1951, 71 y.o., female Today's Date: 05/05/2022  PCP: Ginger Organ MD   REFERRING PROVIDER: Ginger Organ MD  END OF SESSION:   PT End of Session - 05/05/22 0840     Visit Number 3    Number of Visits 13    Date for PT Re-Evaluation 06/22/22    Authorization Type Medicare AB    Authorization Time Period foto v6 and 10, kx mod 15    Progress Note Due on Visit 10    PT Start Time 0840    PT Stop Time 0920    PT Time Calculation (min) 40 min    Activity Tolerance Patient tolerated treatment well    Behavior During Therapy WFL for tasks assessed/performed              Past Medical History:  Diagnosis Date   Agatston coronary artery calcium score greater than 400    Arrhythmia    atrial fibrillation   CHF (congestive heart failure) ([place])    Diabetes mellitus ([place])    x 3 years   DJD (degenerative joint disease)    Encephalitis    Fibromyalgia    HTN (hypertension)    Hyperlipidemia    Sleep apnea    No CPAP   Past Surgical History:  Procedure Laterality Date   APPENDECTOMY     BREAST CYST EXCISION     BREAST EXCISIONAL BIOPSY Left 2004   COLONOSCOPY WITH PROPOFOL N/A 11/26/2020   Procedure: COLONOSCOPY WITH PROPOFOL;  Surgeon: Carol Ada, MD;  Location: [facility];  Service: Endoscopy;  Laterality: N/A;   HEMOSTASIS CLIP PLACEMENT  11/26/2020   Procedure: HEMOSTASIS CLIP PLACEMENT;  Surgeon: Carol Ada, MD;  Location: [facility];  Service: Endoscopy;;   KNEE ARTHROSCOPY     POLYPECTOMY  11/26/2020   Procedure: POLYPECTOMY;  Surgeon: Carol Ada, MD;  [facility];  Service: Endoscopy;;   SUBMUCOSAL TATTOO INJECTION  11/26/2020   Procedure: SUBMUCOSAL TATTOO INJECTION;  Surgeon: Carol Ada, MD;  [facility]: [facility];  Service: Endoscopy;;   TEE WITHOUT CARDIOVERSION N/A 06/29/2020   Procedure: TRANSESOPHAGEAL  ECHOCARDIOGRAM (TEE) with DCCV;  Surgeon: Dionisio Isobella Ascher, MD;  Location: [facility];  Service: Cardiovascular;  Laterality: N/A;   Patient Active Problem List   Diagnosis Date Noted   COPD suggested by initial evaluation ([place]) 04/20/2022   Nodule of lower lobe of left lung 11/11/2021   Fatigue 12/30/2020   Elevated coronary artery calcium score 12/30/2020   Unilateral primary osteoarthritis, left knee 10/06/2020   Respiratory failure, acute ([place]) 19/14/7829   Acute diastolic CHF (congestive heart failure) ([place]) 06/28/2020   Atrial fibrillation with rapid ventricular response ([place]) 06/28/2020   Acquired thrombophilia ([place])    Depression    Atrial fibrillation with RVR ([place]) 06/16/2020   Diabetes mellitus ([place])    HTN (hypertension)    Sleep apnea    Obesity, Class III, BMI 40-49.9 (morbid obesity) ([place])    Chronic venous insufficiency 12/30/2018   Varicose veins of both lower extremities with inflammation 12/30/2018   DJD (degenerative joint disease) 12/30/2018   Snoring 11/27/2018   Daytime sleepiness 11/27/2018   Educated about COVID-19 virus infection 11/27/2018   SOB (shortness of breath) 11/27/2018   Hyperlipidemia 05/20/2015   Knee pain 06/06/2012    REFERRING DIAG: Repeated falls  THERAPY DIAG:  Repeated falls  Other abnormalities of gait and mobility  Muscle weakness (  generalized)  Rationale for Evaluation and Treatment Rehabilitation  PERTINENT HISTORY: HTN, venous insufficiency, AfibRVR, depression, obesity  PRECAUTIONS: Fall, cardiac hx  SUBJECTIVE:  Pt arrives with report of soreness after last session but not too significant, was pretty busy yesterday but seems to be better today. No other new updates.      OBJECTIVE: (objective measures completed at initial evaluation unless otherwise dated)   DIAGNOSTIC FINDINGS:  None recently   PATIENT SURVEYS:  FOTO 38%   COGNITION:           Overall cognitive status: Within functional limits for tasks  assessed                          SENSATION/NEURO: Light touch intact all extremities Resting tremors RUE, pt reports also intention tremor although no ataxia on finger<>chin testing (pt states present about 6 months and MD is aware) Increased difficulty with serial opposition RUE compared to L Unremarkable dysdiadochokinesia testing     POSTURE: rounded shoulders, forward head, and decreased lumbar lordosis       LOWER EXTREMITY MMT:     MMT Right eval Left eval  Hip flexion 5 5  Hip abduction (modified sitting) 5 5  Hip internal rotation      Hip external rotation      Knee flexion 5 5  Knee extension 4+ 4+   (Blank rows = not tested)   Comments: grip strength mildly reduced on L compared to R but both intact       FUNCTIONAL TESTS:  5 times sit to stand: 16sec from standard chair no UE support Functional gait assessment: 15/30; use of assistive device (heavy reliance noted). Difficulty w/ narrow BOS walking, changes in gait speed, increase time for turning. Baseline gait impairments as below   GAIT: Distance walked: within clinic Assistive device utilized: Single point cane Level of assistance: Modified independence Comments: somewhat widened BOS, reduced step length B, reduced truncal rotation, increased lateral weight shifting       TODAY'S TREATMENT:  [facility] Adult PT Treatment:                                      DATE: 05/05/22 Therapeutic Exercise: STS BW x5, 3x5 w 5# DB, cues for form, BOS, and fwd lean Hamstring curls standing at counter, 2.5# each LUE 2x8 Heel raises 2x12 w 2.5# ankle weights BLE Neuromuscular re-ed: Cone marches 2x12 B LE for improved SLS, UE support at counter, supervision and cues for form Standing hip openers over cone x5, over blue block x5  B - HHA as needed, for improved single limb stability and hip activation, cone as external cue for clearance Obstacle course - airex, 4 cone weaving, airex balance beam, CGA-minA gait belt 6 laps  (HHA provided for balance beam first 5 laps, removed 6th lap)   [facility] Adult PT Treatment:                                      DATE: 05/01/22 Therapeutic Exercise: STS 2x5, x5 w 5# DB 2x5 cues for form and BOS Hip extensions x15 each LE at counter Heel raises 2x12 w B UE Neuromuscular re-ed: Obstacle course - airex, 2inch step, 4 cone weaving, blue block stepovers, 4 laps CGA w/ gait belt Cone marches  2x10 B LE for improved single limb stance and sagittal plane activation HHA   [facility] Adult PT Treatment:                                                                                                                            DATE: 04/27/2022  Therapeutic exercise STS 3x5 Hip extensions at counter x10 each LE     PATIENT EDUCATION:  Education details: education on safe HEP performance, rationale for interventions throughout; pt inquires about incorporating cone taps at home - education on performance with B UE support on counter with empty water bottle, sturdy chair behind her, safety with performance and fall risk reduction strategies Person educated: Patient Education method: Explanation, Demonstration, Tactile cues, Verbal cues Education comprehension: verbalized understanding, returned demonstration, verbal cues required, tactile cues required, and needs further education      HOME EXERCISE PROGRAM: Access Code: [email or web address] URL: [email or web address] Date: 04/27/2022 Prepared by: Enis Slipper   Exercises - Sit to Stand with Armchair  - 1 x daily - 7 x weekly - 3 sets - 5 reps - Standing Hip Extension with Counter Support  - 1 x daily - 7 x weekly - 3 sets - 10 reps   ASSESSMENT:   CLINICAL IMPRESSION: Pt is a very pleasant 71 year old woman who arrives to PT session on this date for repeated falls. Pt arrives with report of mild soreness after last session but improved on arrival. Session initiated with LE strength training with progression for resistance where able  and with addition of hamstring exercise with aim of improving knee flexion for clearance. Pt tolerates well with min cues for form. Followed with postural stability training, most difficulty with airex balance beam requiring HHA for first 5 laps, able to wean on last lap although pt demos increased instability but demonstrates appropriate stepping responses with CGA-minA from PT. Pt continues to demonstrate most difficulty with clearance of RLE d/t weakness and knee stiffness. Pt reports "feeling good" as session goes on, states she is pleased with progress so far. No adverse events. Pt departs today's session in no acute distress, all voiced questions/concerns addressed appropriately from PT perspective.       OBJECTIVE IMPAIRMENTS: Abnormal gait, decreased activity tolerance, decreased balance, decreased coordination, decreased endurance, decreased mobility, difficulty walking, decreased strength, improper body mechanics, postural dysfunction, and obesity.    ACTIVITY LIMITATIONS: carrying, lifting, bending, standing, squatting, stairs, and transfers   PARTICIPATION LIMITATIONS: meal prep, cleaning, laundry, shopping, community activity, and yard work   PERSONAL FACTORS: Age and Fitness are also affecting patient's functional outcome.    REHAB POTENTIAL: Good   CLINICAL DECISION MAKING: Evolving/moderate complexity   EVALUATION COMPLEXITY: Moderate     GOALS: Goals reviewed with patient? No   SHORT TERM GOALS: Target date: 05/25/2022   Pt will demonstrate appropriate understanding and performance of initially prescribed HEP in order to facilitate improved independence with management of symptoms.  Baseline: HEP provided on eval  Goal status: INITIAL    2. Pt will score greater than or equal to 44% on FOTO in order to demonstrate improved perception of function due to symptoms.            Baseline: 38%            Goal status: INITIAL    LONG TERM GOALS: Target date: 06/22/2022   Pt  will score 50% on FOTO in order to demonstrate improved perception of functional status due to symptoms.  Baseline: 38% Goal status: INITIAL   2.  Pt will score greater than or equal to 22/30 on Functional Gait assessment in order to indicate reduced fall risk (cutoff score </= 22/30 predictive of falls per Ernestine Conrad et al 2010, MCID 4 pts Beninato et al 2014)            Baseline: 15/30            Goal status: INITIAL    4. Pt will perform 5xSTS in <13 sec in order to demonstrate reduced fall risk and improved functional independence. (MCID of 2.3sec)            Baseline: 16sec standard chair no UE support            Goal status: INITIAL      PLAN: PT FREQUENCY: 2x/week   PT DURATION: 6 weeks   PLANNED INTERVENTIONS: Therapeutic exercises, Therapeutic activity, Neuromuscular re-education, Balance training, Gait training, Patient/Family education, Self Care, Stair training, Vestibular training, DME instructions, Aquatic Therapy, Manual therapy, and Re-evaluation.   PLAN FOR NEXT SESSION:  continue with strength/balance program with emphasis on improved clearance, lateral weight shifting, and dual tasking   Leeroy Cha PT, DPT 05/05/2022 9:27 AM

## 2022-05-05 ENCOUNTER — Encounter: Payer: Self-pay | Admitting: Physical Therapy

## 2022-05-05 ENCOUNTER — Ambulatory Visit: Payer: Medicare Other | Admitting: Physical Therapy

## 2022-05-05 DIAGNOSIS — R296 Repeated falls: Secondary | ICD-10-CM

## 2022-05-05 DIAGNOSIS — R2689 Other abnormalities of gait and mobility: Secondary | ICD-10-CM

## 2022-05-05 DIAGNOSIS — M6281 Muscle weakness (generalized): Secondary | ICD-10-CM | POA: Diagnosis not present

## 2022-05-09 ENCOUNTER — Ambulatory Visit: Payer: Medicare Other | Admitting: Physical Therapy

## 2022-05-09 ENCOUNTER — Encounter: Payer: Self-pay | Admitting: Physical Therapy

## 2022-05-09 DIAGNOSIS — R296 Repeated falls: Secondary | ICD-10-CM

## 2022-05-09 DIAGNOSIS — R2689 Other abnormalities of gait and mobility: Secondary | ICD-10-CM

## 2022-05-09 DIAGNOSIS — M6281 Muscle weakness (generalized): Secondary | ICD-10-CM

## 2022-05-09 NOTE — Therapy (Signed)
OUTPATIENT PHYSICAL THERAPY TREATMENT NOTE   Patient Name: Tammy [name] MRN: [id number] DOB:10/17/1950, 71 y.o., female Today's Date: 05/09/2022  PCP: Ginger Organ MD   REFERRING PROVIDER: Ginger Organ MD  END OF SESSION:   PT End of Session - 05/09/22 1018     Visit Number 4    Number of Visits 13    Date for PT Re-Evaluation 06/22/22    Authorization Type Medicare AB    Authorization Time Period foto v6 and 10, kx mod 15    PT Start Time 1018    PT Stop Time 1100    PT Time Calculation (min) 42 min    Activity Tolerance Patient tolerated treatment well    Behavior During Therapy WFL for tasks assessed/performed               Past Medical History:  Diagnosis Date   Agatston coronary artery calcium score greater than 400    Arrhythmia    atrial fibrillation   CHF (congestive heart failure) ([place])    Diabetes mellitus ([place])    x 3 years   DJD (degenerative joint disease)    Encephalitis    Fibromyalgia    HTN (hypertension)    Hyperlipidemia    Sleep apnea    No CPAP   Past Surgical History:  Procedure Laterality Date   APPENDECTOMY     BREAST CYST EXCISION     BREAST EXCISIONAL BIOPSY Left 2004   COLONOSCOPY WITH PROPOFOL N/A 11/26/2020   Procedure: COLONOSCOPY WITH PROPOFOL;  Surgeon: Carol Ada, MD;  [facility]: [facility];  Service: Endoscopy;  Laterality: N/A;   HEMOSTASIS CLIP PLACEMENT  11/26/2020   Procedure: HEMOSTASIS CLIP PLACEMENT;  Surgeon: Carol Ada, MD;  [facility];  Service: Endoscopy;;   KNEE ARTHROSCOPY     POLYPECTOMY  11/26/2020   Procedure: POLYPECTOMY;  Surgeon: Carol Ada, MD;  [facility];  Service: Endoscopy;;   SUBMUCOSAL TATTOO INJECTION  11/26/2020   Procedure: SUBMUCOSAL TATTOO INJECTION;  Surgeon: Carol Ada, MD;  [facility];  Service: Endoscopy;;   TEE WITHOUT CARDIOVERSION N/A 06/29/2020   Procedure: TRANSESOPHAGEAL ECHOCARDIOGRAM (TEE) with DCCV;  Surgeon:  Dionisio David, MD;  Location: [facility];  Service: Cardiovascular;  Laterality: N/A;   Patient Active Problem List   Diagnosis Date Noted   COPD suggested by initial evaluation ([place]) 04/20/2022   Nodule of lower lobe of left lung 11/11/2021   Fatigue 12/30/2020   Elevated coronary artery calcium score 12/30/2020   Unilateral primary osteoarthritis, left knee 10/06/2020   Respiratory failure, acute ([place]) 61/95/0932   Acute diastolic CHF (congestive heart failure) ([place]) 06/28/2020   Atrial fibrillation with rapid ventricular response ([place]) 06/28/2020   Acquired thrombophilia ([place])    Depression    Atrial fibrillation with RVR ([place]) 06/16/2020   Diabetes mellitus ([place])    HTN (hypertension)    Sleep apnea    Obesity, Class III, BMI 40-49.9 (morbid obesity) ([place])    Chronic venous insufficiency 12/30/2018   Varicose veins of both lower extremities with inflammation 12/30/2018   DJD (degenerative joint disease) 12/30/2018   Snoring 11/27/2018   Daytime sleepiness 11/27/2018   Educated about COVID-19 virus infection 11/27/2018   SOB (shortness of breath) 11/27/2018   Hyperlipidemia 05/20/2015   Knee pain 06/06/2012    REFERRING DIAG: Repeated falls  THERAPY DIAG:  Repeated falls  Other abnormalities of gait and mobility  Muscle weakness (generalized)  Rationale for Evaluation and Treatment Rehabilitation  PERTINENT HISTORY: HTN, venous insufficiency, AfibRVR, depression, obesity  PRECAUTIONS: Fall, cardiac hx  SUBJECTIVE:  Pt arrives with report of soreness  at level in low back and buttocks. 3/10 but after last session I had one spot on R buttock 8/10 but better now.   I have been busy doing deep cleaning.  I used to dance and I loved it.      OBJECTIVE: (objective measures completed at initial evaluation unless otherwise dated)   DIAGNOSTIC FINDINGS:  None recently   PATIENT SURVEYS:  FOTO 38%   COGNITION:           Overall cognitive status: Within functional  limits for tasks assessed                          SENSATION/NEURO: Light touch intact all extremities Resting tremors RUE, pt reports also intention tremor although no ataxia on finger<>chin testing (pt states present about 6 months and MD is aware) Increased difficulty with serial opposition RUE compared to L Unremarkable dysdiadochokinesia testing     POSTURE: rounded shoulders, forward head, and decreased lumbar lordosis       LOWER EXTREMITY MMT:     MMT Right eval Left eval  Hip flexion 5 5  Hip abduction (modified sitting) 5 5  Hip internal rotation      Hip external rotation      Knee flexion 5 5  Knee extension 4+ 4+   (Blank rows = not tested)   Comments: grip strength mildly reduced on L compared to R but both intact       FUNCTIONAL TESTS:  5 times sit to stand: 16sec from standard chair no UE support Functional gait assessment: 15/30; use of assistive device (heavy reliance noted). Difficulty w/ narrow BOS walking, changes in gait speed, increase time for turning. Baseline gait impairments as below   GAIT: Distance walked: within clinic Assistive device utilized: Single point cane Level of assistance: Modified independence Comments: somewhat widened BOS, reduced step length B, reduced truncal rotation, increased lateral weight shifting       TODAY'S TREATMENT:  [facility] Adult PT Treatment:                                                DATE: 05-09-22 Therapeutic Exercise: updated HEP STS 10# KB, cues for form, BOS, and fwd lean improves with VC  2 x 10 then R UE 5 # DB and L UE 10 # KB x 10 Heel raises 2x12  2.5# ankle weights BLE Hamstring curls standing UE support on back of chair, 2.5# each LUE 2x8 Standing marching with UE support as needed on back of chair, encouraging decreased dependence with UE 2 x 10  R and L with 5 # cuff weights Standing hip abduction on R and L 1 x 10 with 5 # cuff weights Standing hip extension on R and L 1 x 10 with 5 # cuff  weights  Step ups with HHA on 6 inch step x 5 each side Neuromuscular re-ed: all below with 5 # cuff weights for below Cha cha dancing forward/backward and side to side Standing hip openers over cone x5, over blue block x10  B - HHA as needed, for improved single limb stability and hip activation, Cone marches 2x12 B LE for improved SLS, UE support  at counter, supervision and cues for form Standing with UE on chair back as needed with toe taps forward ,side and backward on R and L alternating to reinforce dynamic balance.  In corner, SLS as many attempts with decreased use of UE on back of chair for safety and practice of SLS [facility] Adult PT Treatment:                                      DATE: 05/05/22 Therapeutic Exercise: STS BW x5, 3x5 w 5# DB, cues for form, BOS, and fwd lean Hamstring curls standing at counter, 2.5# each LUE 2x8 Heel raises 2x12 w 2.5# ankle weights BLE Neuromuscular re-ed: Cone marches 2x12 B LE for improved SLS, UE support at counter, supervision and cues for form Standing hip openers over cone x5, over blue block x5  B - HHA as needed, for improved single limb stability and hip activation, cone as external cue for clearance Obstacle course - airex, 4 cone weaving, airex balance beam, CGA-minA gait belt 6 laps (HHA provided for balance beam first 5 laps, removed 6th lap)   [facility] Adult PT Treatment:                                      DATE: 05/01/22 Therapeutic Exercise: STS 2x5, x5 w 5# DB 2x5 cues for form and BOS Hip extensions x15 each LE at counter Heel raises 2x12 w B UE Neuromuscular re-ed: Obstacle course - airex, 2inch step, 4 cone weaving, blue block stepovers, 4 laps CGA w/ gait belt Cone marches 2x10 B LE for improved single limb stance and sagittal plane activation HHA   [facility] Adult PT Treatment:                                                                                                                            DATE: 04/27/2022  Therapeutic  exercise STS 3x5 Hip extensions at counter x10 each LE     PATIENT EDUCATION:  Education details: education on safe HEP performance, rationale for interventions throughout; pt inquires about incorporating cone taps at home - education on performance with B UE support on counter with empty water bottle, sturdy chair behind her, safety with performance and fall risk reduction strategies Person educated: Patient Education method: Explanation, Demonstration, Tactile cues, Verbal cues Education comprehension: verbalized understanding, returned demonstration, verbal cues required, tactile cues required, and needs further education      HOME EXERCISE PROGRAM: Access Code: [email or web address] URL: [email or web address] Date: 05/09/2022 Prepared by: Voncille Lo  Exercises - Sit to Stand with Armchair  - 1 x daily - 7 x weekly - 3 sets - 5 reps - Standing Hip Extension with Counter Support  - 1 x daily - 7 x weekly - 3 sets - 10 reps - standing  3 way with hands on back of chair  - 1 x daily - 7 x weekly - 3 sets - 10 reps   ASSESSMENT:   CLINICAL IMPRESSION:  Pt arrives to PT complaining of 4/10 pain and 8/10 in R buttock after last session.  Pt explained DOMS and verbalized understanding of need for progressive strengthening and short term soreness.  Pt was able to complete 40 min of consistent exercise with most standing.  Pt verbalized loving to dance and incorporated dance steps into neuromuscular education for balance and SL  dynamic training.  Pt with added weight in order to strengthen LE.  Pt needing HHA for step ups and backward walking. Pt continues to demonstrate most difficulty with clearance of RLE d/t weakness and knee stiffness. Pt reports feeling better after session with no residual sorenss.        OBJECTIVE IMPAIRMENTS: Abnormal gait, decreased activity tolerance, decreased balance, decreased coordination, decreased endurance, decreased mobility, difficulty walking,  decreased strength, improper body mechanics, postural dysfunction, and obesity.    ACTIVITY LIMITATIONS: carrying, lifting, bending, standing, squatting, stairs, and transfers   PARTICIPATION LIMITATIONS: meal prep, cleaning, laundry, shopping, community activity, and yard work   PERSONAL FACTORS: Age and Fitness are also affecting patient's functional outcome.    REHAB POTENTIAL: Good   CLINICAL DECISION MAKING: Evolving/moderate complexity   EVALUATION COMPLEXITY: Moderate     GOALS: Goals reviewed with patient? No   SHORT TERM GOALS: Target date: 05/25/2022   Pt will demonstrate appropriate understanding and performance of initially prescribed HEP in order to facilitate improved independence with management of symptoms.  Baseline: HEP provided on eval Goal status: INITIAL    2. Pt will score greater than or equal to 44% on FOTO in order to demonstrate improved perception of function due to symptoms.            Baseline: 38%            Goal status: INITIAL    LONG TERM GOALS: Target date: 06/22/2022   Pt will score 50% on FOTO in order to demonstrate improved perception of functional status due to symptoms.  Baseline: 38% Goal status: INITIAL   2.  Pt will score greater than or equal to 22/30 on Functional Gait assessment in order to indicate reduced fall risk (cutoff score </= 22/30 predictive of falls per Ernestine Conrad et al 2010, MCID 4 pts Beninato et al 2014)            Baseline: 15/30            Goal status: INITIAL    4. Pt will perform 5xSTS in <13 sec in order to demonstrate reduced fall risk and improved functional independence. (MCID of 2.3sec)            Baseline: 16sec standard chair no UE support            Goal status: INITIAL      PLAN: PT FREQUENCY: 2x/week   PT DURATION: 6 weeks   PLANNED INTERVENTIONS: Therapeutic exercises, Therapeutic activity, Neuromuscular re-education, Balance training, Gait training, Patient/Family education, Self Care, Stair  training, Vestibular training, DME instructions, Aquatic Therapy, Manual therapy, and Re-evaluation.   PLAN FOR NEXT SESSION:  continue with strength/balance program with emphasis on improved clearance, lateral weight shifting, and dual tasking   Voncille Lo, PT, [facility] Certified Exercise Expert for the Aging Adult  05/09/22 11:13 AM Phone: [phone] Fax: [phone]

## 2022-05-11 ENCOUNTER — Ambulatory Visit: Payer: Medicare Other | Admitting: Physical Therapy

## 2022-05-11 DIAGNOSIS — R2689 Other abnormalities of gait and mobility: Secondary | ICD-10-CM

## 2022-05-11 DIAGNOSIS — M6281 Muscle weakness (generalized): Secondary | ICD-10-CM

## 2022-05-11 DIAGNOSIS — R296 Repeated falls: Secondary | ICD-10-CM

## 2022-05-11 NOTE — Therapy (Signed)
OUTPATIENT PHYSICAL THERAPY TREATMENT NOTE   Patient Name: Tammy [name] MRN: [id number] DOB:1950-07-27, 71 y.o., female Today's Date: 05/11/2022  PCP: Ginger Organ MD   REFERRING PROVIDER: Ginger Organ MD  END OF SESSION:   PT End of Session - 05/11/22 1106     Visit Number 5    Number of Visits 13    Date for PT Re-Evaluation 06/22/22    Authorization Type Medicare AB    Authorization Time Period foto v6 and 10, kx mod 15    PT Start Time 1103    PT Stop Time 1146    PT Time Calculation (min) 43 min    Activity Tolerance Patient tolerated treatment well    Behavior During Therapy WFL for tasks assessed/performed                Past Medical History:  Diagnosis Date   [place] coronary artery calcium score greater than 400    Arrhythmia    atrial fibrillation   CHF (congestive heart failure) ([place])    Diabetes mellitus ([place])    x 3 years   DJD (degenerative joint disease)    Encephalitis    Fibromyalgia    HTN (hypertension)    Hyperlipidemia    Sleep apnea    No CPAP   Past Surgical History:  Procedure Laterality Date   APPENDECTOMY     BREAST CYST EXCISION     BREAST EXCISIONAL BIOPSY Left 2004   COLONOSCOPY WITH PROPOFOL N/A 11/26/2020   Procedure: COLONOSCOPY WITH PROPOFOL;  Surgeon: Carol Ada, MD;  Location: [facility];  Service: Endoscopy;  Laterality: N/A;   HEMOSTASIS CLIP PLACEMENT  11/26/2020   Procedure: HEMOSTASIS CLIP PLACEMENT;  Surgeon: Carol Ada, MD;  Location: [facility];  Service: Endoscopy;;   KNEE ARTHROSCOPY     POLYPECTOMY  11/26/2020   Procedure: POLYPECTOMY;  Surgeon: Carol Ada, MD;  Location: [facility];  Service: Endoscopy;;   SUBMUCOSAL TATTOO INJECTION  11/26/2020   Procedure: SUBMUCOSAL TATTOO INJECTION;  Surgeon: Carol Ada, MD;  Location: [facility];  Service: Endoscopy;;   TEE WITHOUT CARDIOVERSION N/A 06/29/2020   Procedure: TRANSESOPHAGEAL ECHOCARDIOGRAM (TEE) with DCCV;  Surgeon:  Dionisio David, MD;  Location: [facility];  Service: Cardiovascular;  Laterality: N/A;   Patient Active Problem List   Diagnosis Date Noted   COPD suggested by initial evaluation ([place]) 04/20/2022   Nodule of lower lobe of left lung 11/11/2021   Fatigue 12/30/2020   Elevated coronary artery calcium score 12/30/2020   Unilateral primary osteoarthritis, left knee 10/06/2020   Respiratory failure, acute ([place]) 77/82/4235   Acute diastolic CHF (congestive heart failure) ([place]) 06/28/2020   Atrial fibrillation with rapid ventricular response ([place]) 06/28/2020   Acquired thrombophilia ([place])    Depression    Atrial fibrillation with RVR ([place]) 06/16/2020   Diabetes mellitus ([place])    HTN (hypertension)    Sleep apnea    Obesity, Class III, BMI 40-49.9 (morbid obesity) ([place])    Chronic venous insufficiency 12/30/2018   Varicose veins of both lower extremities with inflammation 12/30/2018   DJD (degenerative joint disease) 12/30/2018   Snoring 11/27/2018   Daytime sleepiness 11/27/2018   Educated about COVID-19 virus infection 11/27/2018   SOB (shortness of breath) 11/27/2018   Hyperlipidemia 05/20/2015   Knee pain 06/06/2012    REFERRING DIAG: Repeated falls  THERAPY DIAG:  Repeated falls  Other abnormalities of gait and mobility  Muscle weakness (generalized)  Rationale for Evaluation and Treatment  Rehabilitation  PERTINENT HISTORY: HTN, venous insufficiency, AfibRVR, depression, obesity  PRECAUTIONS: Fall, cardiac hx  SUBJECTIVE:  "No pain today, improving endurance and walking. Came without my cane today for the first time to an appointment.  Walking in I only needed to take a small rest break at the entrance."    OBJECTIVE: (objective measures completed at initial evaluation unless otherwise dated)   DIAGNOSTIC FINDINGS:  None recently   PATIENT SURVEYS:  FOTO 38%   COGNITION:           Overall cognitive status: Within functional limits for tasks assessed                           SENSATION/NEURO: Light touch intact all extremities Resting tremors RUE, pt reports also intention tremor although no ataxia on finger<>chin testing (pt states present about 6 months and MD is aware) Increased difficulty with serial opposition RUE compared to L Unremarkable dysdiadochokinesia testing     POSTURE: rounded shoulders, forward head, and decreased lumbar lordosis       LOWER EXTREMITY MMT:     MMT Right eval Left eval  Hip flexion 5 5  Hip abduction (modified sitting) 5 5  Hip internal rotation      Hip external rotation      Knee flexion 5 5  Knee extension 4+ 4+   (Blank rows = not tested)   Comments: grip strength mildly reduced on L compared to R but both intact       FUNCTIONAL TESTS:  5 times sit to stand: 16sec from standard chair no UE support Functional gait assessment: 15/30; use of assistive device (heavy reliance noted). Difficulty w/ narrow BOS walking, changes in gait speed, increase time for turning. Baseline gait impairments as below 05/11/2022 6 min walk test - 685 ft   GAIT: Distance walked: within clinic Assistive device utilized: Single point cane Level of assistance: Modified independence Comments: somewhat widened BOS, reduced step length B, reduced truncal rotation, increased lateral weight shifting       TODAY'S TREATMENT:  [facility] Adult PT Treatment:                                                DATE: 05/11/2022 Therapeutic Exercise: Standing hip abduction 3 x 10 bil with RTB Standing hip extension 3 x 10 bil with RTB Sit to stand with 15# KTB 2 x 10 Alternating marching L/R toe tapping on 6 inch step - verbal cues to avoid stomping on the step to promote control Step ups 2 x 5 Leading with L/ R foot on 6 inch step - verbal cues to avoid RLE circumduction to place foot on step. Therapeutic Activity: 6 min walk test - 685 ft   [facility] Adult PT Treatment:                                                DATE:  05-09-22 Therapeutic Exercise: updated HEP STS 10# KB, cues for form, BOS, and fwd lean improves with VC  2 x 10 then R UE 5 # DB and L UE 10 # KB x 10 Heel raises 2x12  2.5# ankle weights BLE Hamstring curls standing  UE support on back of chair, 2.5# each LUE 2x8 Standing marching with UE support as needed on back of chair, encouraging decreased dependence with UE 2 x 10  R and L with 5 # cuff weights Standing hip abduction on R and L 1 x 10 with 5 # cuff weights Standing hip extension on R and L 1 x 10 with 5 # cuff weights  Step ups with HHA on 6 inch step x 5 each side Neuromuscular re-ed: all below with 5 # cuff weights for below Cha cha dancing forward/backward and side to side Standing hip openers over cone x5, over blue block x10  B - HHA as needed, for improved single limb stability and hip activation, Cone marches 2x12 B LE for improved SLS, UE support at counter, supervision and cues for form Standing with UE on chair back as needed with toe taps forward ,side and backward on R and L alternating to reinforce dynamic balance.  In corner, SLS as many attempts with decreased use of UE on back of chair for safety and practice of SLS [facility] Adult PT Treatment:                                      DATE: 05/05/22 Therapeutic Exercise: STS BW x5, 3x5 w 5# DB, cues for form, BOS, and fwd lean Hamstring curls standing at counter, 2.5# each LUE 2x8 Heel raises 2x12 w 2.5# ankle weights BLE Neuromuscular re-ed: Cone marches 2x12 B LE for improved SLS, UE support at counter, supervision and cues for form Standing hip openers over cone x5, over blue block x5  B - HHA as needed, for improved single limb stability and hip activation, cone as external cue for clearance Obstacle course - airex, 4 cone weaving, airex balance beam, CGA-minA gait belt 6 laps (HHA provided for balance beam first 5 laps, removed 6th lap)     PATIENT EDUCATION:  Education details: education on safe HEP performance,  rationale for interventions throughout; pt inquires about incorporating cone taps at home - education on performance with B UE support on counter with empty water bottle, sturdy chair behind her, safety with performance and fall risk reduction strategies Person educated: Patient Education method: Explanation, Demonstration, Tactile cues, Verbal cues Education comprehension: verbalized understanding, returned demonstration, verbal cues required, tactile cues required, and needs further education      HOME EXERCISE PROGRAM: Access Code: [email or web address] URL: [email or web address] Date: 05/09/2022 Prepared by: Voncille Lo  Exercises - Sit to Stand with Armchair  - 1 x daily - 7 x weekly - 3 sets - 5 reps - Standing Hip Extension with Counter Support  - 1 x daily - 7 x weekly - 3 sets - 10 reps - standing 3 way with hands on back of chair  - 1 x daily - 7 x weekly - 3 sets - 10 reps   ASSESSMENT:   CLINICAL IMPRESSION: Pt arrives to session noting no pain and has reported that today is the first session she came to without the use of her SPC. She did well with 6 min walk test and only took 1 rest break last 30 seconds, she got 685 ft which is 55.6% below the age related norm. Cotninued working on hip strengthening with resistance providing standing rest breaks to promote endurance. Provided Red theraband to add to her current HEP. End of session she noted feeling good.  OBJECTIVE IMPAIRMENTS: Abnormal gait, decreased activity tolerance, decreased balance, decreased coordination, decreased endurance, decreased mobility, difficulty walking, decreased strength, improper body mechanics, postural dysfunction, and obesity.    ACTIVITY LIMITATIONS: carrying, lifting, bending, standing, squatting, stairs, and transfers   PARTICIPATION LIMITATIONS: meal prep, cleaning, laundry, shopping, community activity, and yard work   PERSONAL FACTORS: Age and Fitness are also affecting  patient's functional outcome.    REHAB POTENTIAL: Good   CLINICAL DECISION MAKING: Evolving/moderate complexity   EVALUATION COMPLEXITY: Moderate     GOALS: Goals reviewed with patient? No   SHORT TERM GOALS: Target date: 05/25/2022   Pt will demonstrate appropriate understanding and performance of initially prescribed HEP in order to facilitate improved independence with management of symptoms.  Baseline: HEP provided on eval Goal status: INITIAL    2. Pt will score greater than or equal to 44% on FOTO in order to demonstrate improved perception of function due to symptoms.            Baseline: 38%            Goal status: INITIAL    LONG TERM GOALS: Target date: 06/22/2022   Pt will score 50% on FOTO in order to demonstrate improved perception of functional status due to symptoms.  Baseline: 38% Goal status: INITIAL   2.  Pt will score greater than or equal to 22/30 on Functional Gait assessment in order to indicate reduced fall risk (cutoff score </= 22/30 predictive of falls per Ernestine Conrad et al 2010, MCID 4 pts Beninato et al 2014)            Baseline: 15/30            Goal status: INITIAL    4. Pt will perform 5xSTS in <13 sec in order to demonstrate reduced fall risk and improved functional independence. (MCID of 2.3sec)            Baseline: 16sec standard chair no UE support            Goal status: INITIAL      PLAN: PT FREQUENCY: 2x/week   PT DURATION: 6 weeks   PLANNED INTERVENTIONS: Therapeutic exercises, Therapeutic activity, Neuromuscular re-education, Balance training, Gait training, Patient/Family education, Self Care, Stair training, Vestibular training, DME instructions, Aquatic Therapy, Manual therapy, and Re-evaluation.   PLAN FOR NEXT SESSION:  continue with strength/balance program with emphasis on improved clearance, lateral weight shifting, and dual tasking   Kasyn Stouffer PT, DPT, LAT, ATC  05/11/22  12:04 PM

## 2022-05-12 ENCOUNTER — Ambulatory Visit (INDEPENDENT_AMBULATORY_CARE_PROVIDER_SITE_OTHER): Payer: Medicare Other | Admitting: Pulmonary Disease

## 2022-05-12 ENCOUNTER — Encounter: Payer: Self-pay | Admitting: Pulmonary Disease

## 2022-05-12 VITALS — BP 122/70 | HR 75 | Temp 97.6°F | Ht 60.0 in | Wt 254.4 lb

## 2022-05-12 DIAGNOSIS — J449 Chronic obstructive pulmonary disease, unspecified: Secondary | ICD-10-CM

## 2022-05-12 DIAGNOSIS — R911 Solitary pulmonary nodule: Secondary | ICD-10-CM

## 2022-05-12 DIAGNOSIS — Z87891 Personal history of nicotine dependence: Secondary | ICD-10-CM | POA: Diagnosis not present

## 2022-05-12 MED ORDER — TRELEGY ELLIPTA 100-62.5-25 MCG/ACT IN AEPB: 1.0000 | INHALATION_SPRAY | Freq: Every day | RESPIRATORY_TRACT | 0 refills | Status: DC

## 2022-05-12 NOTE — Progress Notes (Signed)
Subjective:    Patient ID: Tammy [name], female    DOB: 11/16/1950, 71 y.o.   MRN: [id number] Patient Care Team: Ginger Organ., MD as PCP - General (Internal Medicine) [name], MD as PCP - Cardiology (Cardiology) Telford Nab, RN as Oncology Nurse Navigator  Chief Complaint  Patient presents with   Follow-up    Lung nodule. No SOB or cough. Wheezing when laying down.    HPI Is a 71 year old former smoker (quit 2010, 30 PY) who presents for follow-up on the issue of a left lower lobe nodule noted on LDCT previously.  She was initially evaluated on 20 December 2021.  For the details of that consult please refer to that note.  Patient has been asymptomatic with regards to the lung findings.  She has been having however the dyspnea for a number of years.  She does not endorse any cough or sputum production.  No fevers, chills or sweats.  No hemoptysis.  She has had no chest pain, paroxysmal nocturnal dyspnea or orthopnea.  At her prior evaluation we reviewed chest CTs as far back as 2020 and it appears that the lesion was there as well.  She had PFTs on 20 April 2022 which are consistent with moderate obstructive lung disease.  Patient does not endorse any other symptomatology.  Overall she feels well and looks well.   DATA 10/26/2021 chest LDCT: Left lower lobe pulmonary nodule main diameter of 10.5 mm, poorly defined. 11/09/2021 PET/CT: No signs of hypermetabolic activity on the nodule in question cannot exclude indolent bronchogenic neoplasm, 88-monthfollow-up chest CT. 03/06/2022 chest CT: Persistent vague area of nodularity unchanged from prior, recommend follow-up CT 6 months. 04/20/2022 PFTs: FEV1 1.48 L or 77% predicted, FVC 2.32 L or 91% predicted, FEV1/FVC 64%, lung volumes normal with mild hyperinflation noted.  Bronchodilator response.  Diffusion capacity normal.  Consistent with moderate obstruction.  Review of Systems A 10 point review of systems was  performed and it is as noted above otherwise negative.   Patient Active Problem List   Diagnosis Date Noted   COPD suggested by initial evaluation ([place] 04/20/2022   Nodule of lower lobe of left lung 11/11/2021   Fatigue 12/30/2020   Elevated coronary artery calcium score 12/30/2020   Unilateral primary osteoarthritis, left knee 10/06/2020   Respiratory failure, acute ([facility] 101/03/3234  Acute diastolic CHF (congestive heart failure) ([place] 06/28/2020   Atrial fibrillation with rapid ventricular response ([facility] 06/28/2020   Acquired thrombophilia ([place]    Depression    Atrial fibrillation with RVR ([facility] 06/16/2020   Diabetes mellitus (H[facility])    HTN (hypertension)    Sleep apnea    Obesity, Class III, BMI 40-49.9 (morbid obesity) ([place]    Chronic venous insufficiency 12/30/2018   Varicose veins of both lower extremities with inflammation 12/30/2018   DJD (degenerative joint disease) 12/30/2018   Snoring 11/27/2018   Daytime sleepiness 11/27/2018   Educated about COVID-19 virus infection 11/27/2018   SOB (shortness of breath) 11/27/2018   Hyperlipidemia 05/20/2015   Knee pain 06/06/2012   Social History   Tobacco Use   Smoking status: Former    Packs/day: 1.00    Years: 30.00    Total pack years: 30.00    Types: Cigarettes    Quit date: 11/01/2008    Years since quitting: 13.5   Smokeless tobacco: Never  Substance Use Topics   Alcohol use: No    Alcohol/week: 0.0 standard drinks of alcohol   Allergies  Allergen Reactions   Betadine [Povidone Iodine] Anaphylaxis   Contrast Media [Iodinated Contrast Media] Anaphylaxis   Iodine Anaphylaxis   Metrizamide Anaphylaxis   Povidone-Iodine Anaphylaxis   Shellfish Allergy Anaphylaxis   Hydrocodone-Acetaminophen Nausea Only   Current Meds  Medication Sig   apixaban (ELIQUIS) 5 MG TABS tablet Take 1 tablet (5 mg total) by mouth 2 (two) times daily.   beta carotene w/minerals (OCUVITE) tablet Take 1 tablet by mouth daily.    diltiazem (CARDIZEM CD) 240 MG 24 hr capsule Take 1 capsule (240 mg total) by mouth daily.   docusate sodium (COLACE) 100 MG capsule Take 100 mg by mouth daily as needed for mild constipation.   EPINEPHrine 0.3 mg/0.3 mL IJ SOAJ injection Inject 0.3 mg into the muscle as needed for anaphylaxis.   Evolocumab (REPATHA SURECLICK) 767 MG/ML SOAJ Inject 140 mg into the skin every 14 (fourteen) days.   fesoterodine (TOVIAZ) 4 MG TB24 tablet Take 4 mg by mouth daily.   furosemide (LASIX) 20 MG tablet Take 1 tablet (20 mg total) by mouth 2 (two) times daily. (Patient taking differently: Take 20 mg by mouth daily.)   metFORMIN (GLUCOPHAGE) 1000 MG tablet Take 1,000 mg by mouth daily with breakfast.    sotalol (BETAPACE) 80 MG tablet Take 1 tablet (80 mg total) by mouth every 12 (twelve) hours.   tirzepatide [facility]) 5 MG/0.5ML Pen inject '5mg'$  Subcutaneous weekly for 90 days   valACYclovir (VALTREX) 1000 MG tablet Take 1,000 mg by mouth daily.   Immunization History  Administered Date(s) Administered   Influenza-Unspecified 03/21/2021   PFIZER Comirnaty(Gray Top)Covid-19 Tri-Sucrose Vaccine 10/03/2019, 10/31/2019  Declined flu vaccine     Objective:   Physical Exam BP 122/70 (BP Location: Left Arm, Cuff Size: Large)   Pulse 75   Temp 97.6 F (36.4 C)   Ht 5' (1.524 m)   Wt 254 lb 6.4 oz (115.4 kg)   SpO2 92%   BMI 49.68 kg/m  GENERAL: Morbidly obese woman, no acute distress, fully ambulatory.  No conversational dyspnea. HEAD: Normocephalic, atraumatic.  EYES: Pupils equal, round, reactive to light.  No scleral icterus.  MOUTH: Oral mucosa moist.  No thrush. NECK: Supple. No thyromegaly. Trachea midline. No JVD.  No adenopathy. PULMONARY: Good air entry bilaterally.  No adventitious sounds. CARDIOVASCULAR: S1 and S2. Regular rate and rhythm.  No rubs, murmurs or gallops heard.. ABDOMEN: Obese, otherwise benign. MUSCULOSKELETAL: No joint deformity, no clubbing, trace lower extremity edema.   NEUROLOGIC: Grossly nonfocal, gait slow.  Speech is fluent. SKIN: Intact,warm,dry.  Multiple varicosities lower extremities, mild stasis changes. PSYCH: Mood and behavior normal.   Recent Results (from the past 2160 hour(s))  Pulmonary Function Test [facility] Only     Status: None   Collection Time: 04/20/22  4:02 PM  Result Value Ref Range   FVC-Pre 2.32 L   FVC-%Pred-Pre 91 %   FVC-Post 2.22 L   FVC-%Pred-Post 88 %   FVC-%Change-Post -4 %   FEV1-Pre 1.48 L   FEV1-%Pred-Pre 77 %   FEV1-Post 1.43 L   FEV1-%Pred-Post 75 %   FEV1-%Change-Post -2 %   FEV6-Pre 2.32 L   FEV6-%Pred-Pre 96 %   FEV6-Post 2.22 L   FEV6-%Pred-Post 92 %   FEV6-%Change-Post -4 %   Pre FEV1/FVC ratio 64 %   FEV1FVC-%Pred-Pre 84 %   Post FEV1/FVC ratio 65 %   FEV1FVC-%Change-Post 1 %   Pre FEV6/FVC Ratio 100 %   FEV6FVC-%Pred-Pre 104 %   Post FEV6/FVC ratio 100 %  FEV6FVC-%Pred-Post 104 %   FEV6FVC-%Change-Post 0 %   FEF 25-75 Pre 0.94 L/sec   FEF2575-%Pred-Pre 55 %   FEF 25-75 Post 0.77 L/sec   FEF2575-%Pred-Post 46 %   FEF2575-%Change-Post -17 %   RV 2.59 L   RV % pred 130 %   TLC 5.16 L   TLC % pred 115 %   DLCO unc 20.51 ml/min/mmHg   DLCO unc % pred 121 %   DL/VA 4.73 ml/min/mmHg/L   DL/VA % pred 110 %  Study consistent with moderate obstructive airways disease.   Images is from CT chest performed 06 March 2022 showing a small nodule (new) in the left upper lobe that appears inflammatory:     Lesion in question, subsolid, unchanged:    Assessment & Plan:     ICD-10-CM   1. Stage 2 moderate COPD by GOLD classification ([place])  J44.9 CT CHEST WO CONTRAST   He has moderate COPD by recent PFTs No significant bronchodilator response Has noted some wheezing We will give trial of Trelegy Ellipta 100     2. Nodule of lower lobe of left lung  R91.1 CT CHEST WO CONTRAST   Persistent opacity that has been noted since 2020 New mild inflammatory change left upper lobe Follow-up with CT  chest no contrast in 6 months    3. Former smoker  Z87.891    No evidence of relapse 30-pack-year total in the past Quit 13.5 years ago     Orders Placed This Encounter  Procedures   CT CHEST WO CONTRAST    In 6 months    Standing Status:   Future    Standing Expiration Date:   05/13/2023    Order Specific Question:   Preferred imaging location?    Answer:   [facility] ordered this encounter  Medications   Fluticasone-Umeclidin-Vilant (TRELEGY ELLIPTA) 100-62.5-25 MCG/ACT AEPB    Sig: Inhale 1 Dose into the lungs daily.    Dispense:  14 each    Refill:  0    Order Specific Question:   Lot Number?    Answer:   [id number]    Order Specific Question:   Expiration Date?    Answer:   08/18/2023    Order Specific Question:   Quantity    Answer:   1   We have ordered a follow-up CT scan chest for 6 months.  Of note the abnormality in question has been noted since 2020 (independent review).  We will see the patient after that CT is performed.  He is to contact us prior to that time should any new difficulties arise.  She is also to let us know how Trelegy is working for her so we can order it to her mail order pharmacy.  Follow-up visit in 3 months time to reassess COPD.  Renold Don, MD Advanced Bronchoscopy PCCM [facility] [facility]    *This note was dictated using voice recognition software/Dragon.  Despite best efforts to proofread, errors can occur which can change the meaning. Any transcriptional errors that result from this process are unintentional and may not be fully corrected at the time of dictation.

## 2022-05-12 NOTE — Therapy (Signed)
OUTPATIENT PHYSICAL THERAPY TREATMENT NOTE   Patient Name: Tammy [name] MRN: [id number] DOB:[birth year], 71 y.o., female Today's Date: 05/15/2022  PCP: Ginger Organ MD   REFERRING PROVIDER: Ginger Organ MD  END OF SESSION:   PT End of Session - 05/15/22 1004     Visit Number 6    Number of Visits 13    Date for PT Re-Evaluation 06/22/22    Authorization Type Medicare AB    Authorization Time Period foto v6 and 10, kx mod 15    Progress Note Due on Visit 10    PT Start Time 1010    PT Stop Time 1052    PT Time Calculation (min) 42 min    Activity Tolerance Patient tolerated treatment well    Behavior During Therapy WFL for tasks assessed/performed                 Past Medical History:  Diagnosis Date   Agatston coronary artery calcium score greater than 400    Arrhythmia    atrial fibrillation   CHF (congestive heart failure) ([place])    Diabetes mellitus ([place])    x 3 years   DJD (degenerative joint disease)    Encephalitis    Fibromyalgia    HTN (hypertension)    Hyperlipidemia    Sleep apnea    No CPAP   Past Surgical History:  Procedure Laterality Date   APPENDECTOMY     BREAST CYST EXCISION     BREAST EXCISIONAL BIOPSY Left 2004   COLONOSCOPY WITH PROPOFOL N/A 11/26/2020   Procedure: COLONOSCOPY WITH PROPOFOL;  Surgeon: Carol Ada, MD;  Location: [facility];  Service: Endoscopy;  Laterality: N/A;   HEMOSTASIS CLIP PLACEMENT  11/26/2020   Procedure: HEMOSTASIS CLIP PLACEMENT;  Surgeon: Carol Ada, MD;  Location: [facility];  Service: Endoscopy;;   KNEE ARTHROSCOPY     POLYPECTOMY  11/26/2020   Procedure: POLYPECTOMY;  Surgeon: Carol Ada, MD;  [facility];  Service: Endoscopy;;   SUBMUCOSAL TATTOO INJECTION  11/26/2020   Procedure: SUBMUCOSAL TATTOO INJECTION;  Surgeon: Carol Ada, MD;  [facility];  Service: Endoscopy;;   TEE WITHOUT CARDIOVERSION N/A 06/29/2020   Procedure: TRANSESOPHAGEAL  ECHOCARDIOGRAM (TEE) with DCCV;  Surgeon: Dionisio Charletta Voight, MD;  Location: [facility];  Service: Cardiovascular;  Laterality: N/A;   Patient Active Problem List   Diagnosis Date Noted   COPD suggested by initial evaluation ([place]) 04/20/2022   Nodule of lower lobe of left lung 11/11/2021   Fatigue 12/30/2020   Elevated coronary artery calcium score 12/30/2020   Unilateral primary osteoarthritis, left knee 10/06/2020   Respiratory failure, acute ([place]) 76/16/0737   Acute diastolic CHF (congestive heart failure) ([place]) 06/28/2020   Atrial fibrillation with rapid ventricular response ([place]) 06/28/2020   Acquired thrombophilia ([place])    Depression    Atrial fibrillation with RVR ([place]) 06/16/2020   Diabetes mellitus ([place])    HTN (hypertension)    Sleep apnea    Obesity, Class III, BMI 40-49.9 (morbid obesity) ([place])    Chronic venous insufficiency 12/30/2018   Varicose veins of both lower extremities with inflammation 12/30/2018   DJD (degenerative joint disease) 12/30/2018   Snoring 11/27/2018   Daytime sleepiness 11/27/2018   Educated about COVID-19 virus infection 11/27/2018   SOB (shortness of breath) 11/27/2018   Hyperlipidemia 05/20/2015   Knee pain 06/06/2012    REFERRING DIAG: Repeated falls  THERAPY DIAG:  Repeated falls  Other abnormalities of gait and mobility  Muscle weakness (generalized)  Rationale for Evaluation and Treatment Rehabilitation  PERTINENT HISTORY: HTN, venous insufficiency, AfibRVR, depression, obesity  PRECAUTIONS: Fall, cardiac hx  SUBJECTIVE:  Pt arrives without cane, states she has been doing well overall. States she has still been taking cane with her just in case when she is walking more. Denies significant pain/soreness after last session    OBJECTIVE: (objective measures completed at initial evaluation unless otherwise dated)   DIAGNOSTIC FINDINGS:  None recently   PATIENT SURVEYS:  FOTO 38% FOTO 48% 05/15/22   COGNITION:            Overall cognitive status: Within functional limits for tasks assessed                          SENSATION/NEURO: Light touch intact all extremities Resting tremors RUE, pt reports also intention tremor although no ataxia on finger<>chin testing (pt states present about 6 months and MD is aware) Increased difficulty with serial opposition RUE compared to L Unremarkable dysdiadochokinesia testing     POSTURE: rounded shoulders, forward head, and decreased lumbar lordosis       LOWER EXTREMITY MMT:     MMT Right eval Left eval  Hip flexion 5 5  Hip abduction (modified sitting) 5 5  Hip internal rotation      Hip external rotation      Knee flexion 5 5  Knee extension 4+ 4+   (Blank rows = not tested)   Comments: grip strength mildly reduced on L compared to R but both intact       FUNCTIONAL TESTS:  5 times sit to stand: 16sec from standard chair no UE support Functional gait assessment: 15/30; use of assistive device (heavy reliance noted). Difficulty w/ narrow BOS walking, changes in gait speed, increase time for turning. Baseline gait impairments as below 05/11/2022 6 min walk test - 685 ft   GAIT: Distance walked: within clinic Assistive device utilized: Single point cane Level of assistance: Modified independence Comments: somewhat widened BOS, reduced step length B, reduced truncal rotation, increased lateral weight shifting       TODAY'S TREATMENT:  [facility] Adult PT Treatment:                                     DATE: 05/15/22 Therapeutic Exercise: Standing hip abd superset with extension, 2x8 each LE, at counter for UE support Standing heel raises at counter x16, UE support as needed Cone taps B, 2# ankle weights x20, UE support on counter 4 inch step ups 2x8 unilat UE support, cues to maintain movement in sagittal plane 15# KB STS x10 from lowest mat, 20# DB 2x5 STS from mat  Neuromuscular re-ed: 4 laps 4 cones weaving, airex step over, airex beam, CGA, HHA  provided on beam 4 cone weaving fwd/back, SBA-CGA, cues for fwd velocity and reduced trunk lean with retro walking Static stance dynadisc at counter CGA-minA, 3 bouts to fatigue (ranging 5-20sec) for improved postural stability   [facility] Adult PT Treatment:                                                DATE: 05/11/2022 Therapeutic Exercise: Standing hip abduction 3 x 10 bil with RTB Standing hip extension 3  x 10 bil with RTB Sit to stand with 15# KTB 2 x 10 Alternating marching L/R toe tapping on 6 inch step - verbal cues to avoid stomping on the step to promote control Step ups 2 x 5 Leading with L/ R foot on 6 inch step - verbal cues to avoid RLE circumduction to place foot on step. Therapeutic Activity: 6 min walk test - 685 ft   [facility] Adult PT Treatment:                                                DATE: 05-09-22 Therapeutic Exercise: updated HEP STS 10# KB, cues for form, BOS, and fwd lean improves with VC  2 x 10 then R UE 5 # DB and L UE 10 # KB x 10 Heel raises 2x12  2.5# ankle weights BLE Hamstring curls standing UE support on back of chair, 2.5# each LUE 2x8 Standing marching with UE support as needed on back of chair, encouraging decreased dependence with UE 2 x 10  R and L with 5 # cuff weights Standing hip abduction on R and L 1 x 10 with 5 # cuff weights Standing hip extension on R and L 1 x 10 with 5 # cuff weights  Step ups with HHA on 6 inch step x 5 each side Neuromuscular re-ed: all below with 5 # cuff weights for below Cha cha dancing forward/backward and side to side Standing hip openers over cone x5, over blue block x10  B - HHA as needed, for improved single limb stability and hip activation, Cone marches 2x12 B LE for improved SLS, UE support at counter, supervision and cues for form Standing with UE on chair back as needed with toe taps forward ,side and backward on R and L alternating to reinforce dynamic balance.  In corner, SLS as many attempts with decreased  use of UE on back of chair for safety and practice of SLS     PATIENT EDUCATION:  Education details: HEP, rationale for interventions, FOTO, progress thus far Person educated: Patient Education method: Explanation, Demonstration, Tactile cues, Verbal cues Education comprehension: verbalized understanding, returned demonstration, verbal cues required, tactile cues required, and needs further education      HOME EXERCISE PROGRAM: Access Code: [email or web address] URL: [email or web address] Date: 05/09/2022 Prepared by: Voncille Lo  Exercises - Sit to Stand with Armchair  - 1 x daily - 7 x weekly - 3 sets - 5 reps - Standing Hip Extension with Counter Support  - 1 x daily - 7 x weekly - 3 sets - 10 reps - standing 3 way with hands on back of chair  - 1 x daily - 7 x weekly - 3 sets - 10 reps   ASSESSMENT:   CLINICAL IMPRESSION: Pt arrives with no pain, states she has noticed improvement in her balance since beginning therapy. Pt progresses quite well with increased resistance for LE strengthening exercises, denies any pain and requires minimal rest breaks. Pt continues to demonstrate mild postural instability when balance specifically challenged but requires no more than close SBA for majority of obstacle course, with exception of CGA-HHA on airex beam although pt has no overt LOB which is improved compared to previous sessions. Pt also scores 48% on FOTO which is notably improved compared to 38% on initial evaluation and is in line with pt report  of perceived progress. Pt departs today's session in no acute distress, no adverse events, all voiced questions/concerns addressed appropriately from PT perspective.        OBJECTIVE IMPAIRMENTS: Abnormal gait, decreased activity tolerance, decreased balance, decreased coordination, decreased endurance, decreased mobility, difficulty walking, decreased strength, improper body mechanics, postural dysfunction, and obesity.    ACTIVITY  LIMITATIONS: carrying, lifting, bending, standing, squatting, stairs, and transfers   PARTICIPATION LIMITATIONS: meal prep, cleaning, laundry, shopping, community activity, and yard work   PERSONAL FACTORS: Age and Fitness are also affecting patient's functional outcome.    REHAB POTENTIAL: Good   CLINICAL DECISION MAKING: Evolving/moderate complexity   EVALUATION COMPLEXITY: Moderate     GOALS: Goals reviewed with patient? No   SHORT TERM GOALS: Target date: 05/25/2022   Pt will demonstrate appropriate understanding and performance of initially prescribed HEP in order to facilitate improved independence with management of symptoms.  Baseline: HEP provided on eval Goal status: INITIAL    2. Pt will score greater than or equal to 44% on FOTO in order to demonstrate improved perception of function due to symptoms.            Baseline: 38% 05/15/22: 48%            Goal status: MET   LONG TERM GOALS: Target date: 06/22/2022   Pt will score 50% on FOTO in order to demonstrate improved perception of functional status due to symptoms.  Baseline: 38% Goal status: INITIAL   2.  Pt will score greater than or equal to 22/30 on Functional Gait assessment in order to indicate reduced fall risk (cutoff score </= 22/30 predictive of falls per Ernestine Conrad et al 2010, MCID 4 pts Beninato et al 2014)            Baseline: 15/30            Goal status: INITIAL    4. Pt will perform 5xSTS in <13 sec in order to demonstrate reduced fall risk and improved functional independence. (MCID of 2.3sec)            Baseline: 16sec standard chair no UE support            Goal status: INITIAL      PLAN: PT FREQUENCY: 2x/week   PT DURATION: 6 weeks   PLANNED INTERVENTIONS: Therapeutic exercises, Therapeutic activity, Neuromuscular re-education, Balance training, Gait training, Patient/Family education, Self Care, Stair training, Vestibular training, DME instructions, Aquatic Therapy, Manual therapy, and  Re-evaluation.   PLAN FOR NEXT SESSION:   continue with strength/balance program with emphasis on improved clearance, lateral weight shifting, and dual tasking   Leeroy Cha PT, DPT 05/15/2022 10:59 AM

## 2022-05-12 NOTE — Progress Notes (Deleted)
   Subjective:    Patient ID: Tammy [name], female    DOB: [birth year], 71 y.o.   MRN: [id number]  HPI    Review of Systems     Objective:   Physical Exam        Assessment & Plan:

## 2022-05-12 NOTE — Patient Instructions (Addendum)
We are giving you a trial of an inhaler called Trelegy this will be 1 puff daily.  Your mouth well after you use it.  Please let us know how you do with this inhaler so we can call it into your mail order pharmacy.  We are ordering a CT scan of the chest in 6 months time.     We will see you in follow-up in 3 months time call sooner should any new problems arise.

## 2022-05-14 ENCOUNTER — Encounter: Payer: Self-pay | Admitting: Pulmonary Disease

## 2022-05-15 ENCOUNTER — Ambulatory Visit: Payer: Medicare Other | Admitting: Physical Therapy

## 2022-05-15 ENCOUNTER — Encounter: Payer: Self-pay | Admitting: Physical Therapy

## 2022-05-15 DIAGNOSIS — R296 Repeated falls: Secondary | ICD-10-CM

## 2022-05-15 DIAGNOSIS — R2689 Other abnormalities of gait and mobility: Secondary | ICD-10-CM | POA: Diagnosis not present

## 2022-05-15 DIAGNOSIS — M6281 Muscle weakness (generalized): Secondary | ICD-10-CM

## 2022-05-15 MED ORDER — TRELEGY ELLIPTA 100-62.5-25 MCG/ACT IN AEPB: 1.0000 | INHALATION_SPRAY | Freq: Every day | RESPIRATORY_TRACT | 1 refills | Status: DC

## 2022-05-17 ENCOUNTER — Ambulatory Visit: Payer: Medicare Other | Attending: Internal Medicine | Admitting: Physical Therapy

## 2022-05-17 DIAGNOSIS — R2689 Other abnormalities of gait and mobility: Secondary | ICD-10-CM | POA: Insufficient documentation

## 2022-05-17 DIAGNOSIS — M6281 Muscle weakness (generalized): Secondary | ICD-10-CM | POA: Diagnosis not present

## 2022-05-17 DIAGNOSIS — R296 Repeated falls: Secondary | ICD-10-CM | POA: Diagnosis not present

## 2022-05-17 NOTE — Therapy (Signed)
OUTPATIENT PHYSICAL THERAPY TREATMENT NOTE   Patient Name: Tammy [name] MRN: [id number] DOB:02/02/1951, 71 y.o., female Today's Date: 05/17/2022  PCP: Ginger Organ MD   REFERRING PROVIDER: Ginger Organ MD  END OF SESSION:   PT End of Session - 05/17/22 1012     Visit Number 7    Number of Visits 13    Date for PT Re-Evaluation 06/22/22    Authorization Type Medicare AB    Authorization Time Period foto v6 and 10, kx mod 15    Progress Note Due on Visit 10    PT Start Time 1011    PT Stop Time 1058    PT Time Calculation (min) 47 min    Activity Tolerance Patient tolerated treatment well    Behavior During Therapy WFL for tasks assessed/performed                  Past Medical History:  Diagnosis Date   Agatston coronary artery calcium score greater than 400    Arrhythmia    atrial fibrillation   CHF (congestive heart failure) ([place])    Diabetes mellitus ([place])    x 3 years   DJD (degenerative joint disease)    Encephalitis    Fibromyalgia    HTN (hypertension)    Hyperlipidemia    Sleep apnea    No CPAP   Past Surgical History:  Procedure Laterality Date   APPENDECTOMY     BREAST CYST EXCISION     BREAST EXCISIONAL BIOPSY Left 2004   COLONOSCOPY WITH PROPOFOL N/A 11/26/2020   Procedure: COLONOSCOPY WITH PROPOFOL;  Surgeon: Carol Ada, MD;  [facility];  Service: Endoscopy;  Laterality: N/A;   HEMOSTASIS CLIP PLACEMENT  11/26/2020   Procedure: HEMOSTASIS CLIP PLACEMENT;  Surgeon: Carol Ada, MD;  [facility];  Service: Endoscopy;;   KNEE ARTHROSCOPY     POLYPECTOMY  11/26/2020   Procedure: POLYPECTOMY;  Surgeon: Carol Ada, MD;  [facility];  Service: Endoscopy;;   SUBMUCOSAL TATTOO INJECTION  11/26/2020   Procedure: SUBMUCOSAL TATTOO INJECTION;  Surgeon: Carol Ada, MD;  [facility];  Service: Endoscopy;;   TEE WITHOUT CARDIOVERSION N/A 06/29/2020   Procedure: TRANSESOPHAGEAL  ECHOCARDIOGRAM (TEE) with DCCV;  Surgeon: Dionisio David, MD;  Location: [facility];  Service: Cardiovascular;  Laterality: N/A;   Patient Active Problem List   Diagnosis Date Noted   COPD suggested by initial evaluation ([place]) 04/20/2022   Nodule of lower lobe of left lung 11/11/2021   Fatigue 12/30/2020   Elevated coronary artery calcium score 12/30/2020   Unilateral primary osteoarthritis, left knee 10/06/2020   Respiratory failure, acute ([place]) 95/03/3266   Acute diastolic CHF (congestive heart failure) ([place]) 06/28/2020   Atrial fibrillation with rapid ventricular response ([place]) 06/28/2020   Acquired thrombophilia ([place])    Depression    Atrial fibrillation with RVR ([place]) 06/16/2020   Diabetes mellitus ([place])    HTN (hypertension)    Sleep apnea    Obesity, Class III, BMI 40-49.9 (morbid obesity) ([place])    Chronic venous insufficiency 12/30/2018   Varicose veins of both lower extremities with inflammation 12/30/2018   DJD (degenerative joint disease) 12/30/2018   Snoring 11/27/2018   Daytime sleepiness 11/27/2018   Educated about COVID-19 virus infection 11/27/2018   SOB (shortness of breath) 11/27/2018   Hyperlipidemia 05/20/2015   Knee pain 06/06/2012    REFERRING DIAG: Repeated falls  THERAPY DIAG:  Repeated falls  Other abnormalities of gait and  mobility  Muscle weakness (generalized)  Rationale for Evaluation and Treatment Rehabilitation  PERTINENT HISTORY: HTN, venous insufficiency, AfibRVR, depression, obesity  PRECAUTIONS: Fall, cardiac hx  SUBJECTIVE:  "I've done a lot of walking the last few days and I can tell soreness in the legs."   No pain today Agg: none Easing: N/A    OBJECTIVE: (objective measures completed at initial evaluation unless otherwise dated)   DIAGNOSTIC FINDINGS:  None recently   PATIENT SURVEYS:  FOTO 38% FOTO 48% 05/15/22   COGNITION:           Overall cognitive status: Within functional limits for tasks assessed                           SENSATION/NEURO: Light touch intact all extremities Resting tremors RUE, pt reports also intention tremor although no ataxia on finger<>chin testing (pt states present about 6 months and MD is aware) Increased difficulty with serial opposition RUE compared to L Unremarkable dysdiadochokinesia testing     POSTURE: rounded shoulders, forward head, and decreased lumbar lordosis       LOWER EXTREMITY MMT:     MMT Right eval Left eval  Hip flexion 5 5  Hip abduction (modified sitting) 5 5  Hip internal rotation      Hip external rotation      Knee flexion 5 5  Knee extension 4+ 4+   (Blank rows = not tested)   Comments: grip strength mildly reduced on L compared to R but both intact       FUNCTIONAL TESTS:  5 times sit to stand: 16sec from standard chair no UE support Functional gait assessment: 15/30; use of assistive device (heavy reliance noted). Difficulty w/ narrow BOS walking, changes in gait speed, increase time for turning. Baseline gait impairments as below 05/11/2022 6 min walk test - 685 ft   GAIT: Distance walked: within clinic Assistive device utilized: Single point cane Level of assistance: Modified independence Comments: somewhat widened BOS, reduced step length B, reduced truncal rotation, increased lateral weight shifting       TODAY'S TREATMENT:   [facility] Adult PT Treatment:                                                DATE: 05/17/2022 Therapeutic Exercise: Nu-step L 5 x 5 min LE only Sit to stand with 15# KB 3 x 10 with 3 sec hoover   Lunge touching down on bos with LLE - modified to mini lunge due to  knee pain 1 x 10 bil LAQ with hip flexion 2 x going to fatigue with 5# Neuromuscular re-ed: Marching in airex pad 3 x 20 Standing on fitter rocker board 3 x 10 to promote ankle strategy    [facility] Adult PT Treatment:                                     DATE: 05/15/22 Therapeutic Exercise: Standing hip abd superset with extension, 2x8  each LE, at counter for UE support Standing heel raises at counter x16, UE support as needed Cone taps B, 2# ankle weights x20, UE support on counter 4 inch step ups 2x8 unilat UE support, cues to maintain movement in sagittal plane 15# KB STS  x10 from lowest mat, 20# DB 2x5 STS from mat  Neuromuscular re-ed: 4 laps 4 cones weaving, airex step over, airex beam, CGA, HHA provided on beam 4 cone weaving fwd/back, SBA-CGA, cues for fwd velocity and reduced trunk lean with retro walking Static stance dynadisc at counter CGA-minA, 3 bouts to fatigue (ranging 5-20sec) for improved postural stability   [facility] Adult PT Treatment:                                                DATE: 05/11/2022 Therapeutic Exercise: Standing hip abduction 3 x 10 bil with RTB Standing hip extension 3 x 10 bil with RTB Sit to stand with 15# KTB 2 x 10 Alternating marching L/R toe tapping on 6 inch step - verbal cues to avoid stomping on the step to promote control Step ups 2 x 5 Leading with L/ R foot on 6 inch step - verbal cues to avoid RLE circumduction to place foot on step. Therapeutic Activity: 6 min walk test - 685 ft     PATIENT EDUCATION:  Education details: HEP, rationale for interventions, FOTO, progress thus far Person educated: Patient Education method: Explanation, Demonstration, Tactile cues, Verbal cues Education comprehension: verbalized understanding, returned demonstration, verbal cues required, tactile cues required, and needs further education      HOME EXERCISE PROGRAM: Access Code: [email or web address] URL: [email or web address] Date: 05/09/2022 Prepared by: Voncille Lo  Exercises - Sit to Stand with Armchair  - 1 x daily - 7 x weekly - 3 sets - 5 reps - Standing Hip Extension with Counter Support  - 1 x daily - 7 x weekly - 3 sets - 10 reps - standing 3 way with hands on back of chair  - 1 x daily - 7 x weekly - 3 sets - 10 reps   ASSESSMENT:   CLINICAL IMPRESSION: Mrs  Herbel arrives to PT today noting increased soreness in the knee as a result of increased walking the last few days. She continues to report improvement in mobility and stability reporting minimal use of DME. Continued working LE strengthening with modified lunge due to knee pain, increased reps/ sets to promote endurance training. She did well with balance training utilizing both ankle/ hip strategies. End of session she noted decreased soreness compared to when she came in.     OBJECTIVE IMPAIRMENTS: Abnormal gait, decreased activity tolerance, decreased balance, decreased coordination, decreased endurance, decreased mobility, difficulty walking, decreased strength, improper body mechanics, postural dysfunction, and obesity.    ACTIVITY LIMITATIONS: carrying, lifting, bending, standing, squatting, stairs, and transfers   PARTICIPATION LIMITATIONS: meal prep, cleaning, laundry, shopping, community activity, and yard work   PERSONAL FACTORS: Age and Fitness are also affecting patient's functional outcome.    REHAB POTENTIAL: Good   CLINICAL DECISION MAKING: Evolving/moderate complexity   EVALUATION COMPLEXITY: Moderate     GOALS: Goals reviewed with patient? No   SHORT TERM GOALS: Target date: 05/25/2022   Pt will demonstrate appropriate understanding and performance of initially prescribed HEP in order to facilitate improved independence with management of symptoms.  Baseline: HEP provided on eval Goal status: INITIAL    2. Pt will score greater than or equal to 44% on FOTO in order to demonstrate improved perception of function due to symptoms.            Baseline: 38% 05/15/22: 48%  Goal status: MET   LONG TERM GOALS: Target date: 06/22/2022   Pt will score 50% on FOTO in order to demonstrate improved perception of functional status due to symptoms.  Baseline: 38% Goal status: INITIAL   2.  Pt will score greater than or equal to 22/30 on Functional Gait assessment in  order to indicate reduced fall risk (cutoff score </= 22/30 predictive of falls per Ernestine Conrad et al 2010, MCID 4 pts Beninato et al 2014)            Baseline: 15/30            Goal status: INITIAL    4. Pt will perform 5xSTS in <13 sec in order to demonstrate reduced fall risk and improved functional independence. (MCID of 2.3sec)            Baseline: 16sec standard chair no UE support            Goal status: INITIAL      PLAN: PT FREQUENCY: 2x/week   PT DURATION: 6 weeks   PLANNED INTERVENTIONS: Therapeutic exercises, Therapeutic activity, Neuromuscular re-education, Balance training, Gait training, Patient/Family education, Self Care, Stair training, Vestibular training, DME instructions, Aquatic Therapy, Manual therapy, and Re-evaluation.   PLAN FOR NEXT SESSION:   continue with strength/balance program with emphasis on improved clearance, lateral weight shifting, and dual tasking  Reika Callanan PT, DPT, LAT, ATC  05/17/22  11:03 AM

## 2022-05-22 ENCOUNTER — Ambulatory Visit: Payer: Medicare Other | Admitting: Physical Therapy

## 2022-05-22 ENCOUNTER — Encounter: Payer: Self-pay | Admitting: Physical Therapy

## 2022-05-22 DIAGNOSIS — M6281 Muscle weakness (generalized): Secondary | ICD-10-CM | POA: Diagnosis not present

## 2022-05-22 DIAGNOSIS — R2689 Other abnormalities of gait and mobility: Secondary | ICD-10-CM | POA: Diagnosis not present

## 2022-05-22 DIAGNOSIS — R296 Repeated falls: Secondary | ICD-10-CM | POA: Diagnosis not present

## 2022-05-22 NOTE — Therapy (Signed)
OUTPATIENT PHYSICAL THERAPY TREATMENT NOTE   Patient Name: Tammy [name] MRN: [id number] DOB:[birth year], 71 y.o., female Today's Date: 05/22/2022  PCP: Ginger Organ MD   REFERRING PROVIDER: Ginger Organ MD  END OF SESSION:   PT End of Session - 05/22/22 1017     Visit Number 8    Number of Visits 13    Date for PT Re-Evaluation 06/22/22    Authorization Type Medicare AB    Authorization Time Period foto 41, kx mod 15    Progress Note Due on Visit 10    PT Start Time 1016    PT Stop Time 1058    PT Time Calculation (min) 42 min    Equipment Utilized During Treatment Gait belt    Activity Tolerance Patient tolerated treatment well    Behavior During Therapy WFL for tasks assessed/performed                   Past Medical History:  Diagnosis Date   Agatston coronary artery calcium score greater than 400    Arrhythmia    atrial fibrillation   CHF (congestive heart failure) ([place])    Diabetes mellitus ([place])    x 3 years   DJD (degenerative joint disease)    Encephalitis    Fibromyalgia    HTN (hypertension)    Hyperlipidemia    Sleep apnea    No CPAP   Past Surgical History:  Procedure Laterality Date   APPENDECTOMY     BREAST CYST EXCISION     BREAST EXCISIONAL BIOPSY Left 2004   COLONOSCOPY WITH PROPOFOL N/A 11/26/2020   Procedure: COLONOSCOPY WITH PROPOFOL;  Surgeon: Carol Ada, MD;  Location: [facility];  Service: Endoscopy;  Laterality: N/A;   HEMOSTASIS CLIP PLACEMENT  11/26/2020   Procedure: HEMOSTASIS CLIP PLACEMENT;  Surgeon: Carol Ada, MD;  Location: [facility];  Service: Endoscopy;;   KNEE ARTHROSCOPY     POLYPECTOMY  11/26/2020   Procedure: POLYPECTOMY;  Surgeon: Carol Ada, MD;  [facility];  Service: Endoscopy;;   SUBMUCOSAL TATTOO INJECTION  11/26/2020   Procedure: SUBMUCOSAL TATTOO INJECTION;  Surgeon: Carol Ada, MD;  [facility]: [facility];  Service: Endoscopy;;   TEE WITHOUT CARDIOVERSION N/A  06/29/2020   Procedure: TRANSESOPHAGEAL ECHOCARDIOGRAM (TEE) with DCCV;  Surgeon: Dionisio David, MD;  Location: [facility];  Service: Cardiovascular;  Laterality: N/A;   Patient Active Problem List   Diagnosis Date Noted   COPD suggested by initial evaluation ([place]) 04/20/2022   Nodule of lower lobe of left lung 11/11/2021   Fatigue 12/30/2020   Elevated coronary artery calcium score 12/30/2020   Unilateral primary osteoarthritis, left knee 10/06/2020   Respiratory failure, acute ([place]) 48/54/6270   Acute diastolic CHF (congestive heart failure) ([place]) 06/28/2020   Atrial fibrillation with rapid ventricular response ([place]) 06/28/2020   Acquired thrombophilia ([place])    Depression    Atrial fibrillation with RVR ([place]) 06/16/2020   Diabetes mellitus ([place])    HTN (hypertension)    Sleep apnea    Obesity, Class III, BMI 40-49.9 (morbid obesity) ([place])    Chronic venous insufficiency 12/30/2018   Varicose veins of both lower extremities with inflammation 12/30/2018   DJD (degenerative joint disease) 12/30/2018   Snoring 11/27/2018   Daytime sleepiness 11/27/2018   Educated about COVID-19 virus infection 11/27/2018   SOB (shortness of breath) 11/27/2018   Hyperlipidemia 05/20/2015   Knee pain 06/06/2012    REFERRING DIAG: Repeated falls  THERAPY DIAG:  Repeated falls  Other abnormalities of gait and mobility  Muscle weakness (generalized)  Rationale for Evaluation and Treatment Rehabilitation  PERTINENT HISTORY: HTN, venous insufficiency, AfibRVR, depression, obesity  PRECAUTIONS: Fall, cardiac hx  SUBJECTIVE:  " Doing good today, no pain."  PAIN:  Are you having pain? Yes: NPRS scale: 0/10 Pain location: bil knees Pain description: ache Aggravating factors: bending the knees too much Relieving factors: getting out of position     OBJECTIVE: (objective measures completed at initial evaluation unless otherwise dated)   DIAGNOSTIC FINDINGS:  None recently   PATIENT  SURVEYS:  FOTO 38% FOTO 48% 05/15/22   COGNITION:           Overall cognitive status: Within functional limits for tasks assessed                          SENSATION/NEURO: Light touch intact all extremities Resting tremors RUE, pt reports also intention tremor although no ataxia on finger<>chin testing (pt states present about 6 months and MD is aware) Increased difficulty with serial opposition RUE compared to L Unremarkable dysdiadochokinesia testing     POSTURE: rounded shoulders, forward head, and decreased lumbar lordosis       LOWER EXTREMITY MMT:     MMT Right eval Left eval  Hip flexion 5 5  Hip abduction (modified sitting) 5 5  Hip internal rotation      Hip external rotation      Knee flexion 5 5  Knee extension 4+ 4+   (Blank rows = not tested)   Comments: grip strength mildly reduced on L compared to R but both intact       FUNCTIONAL TESTS:  5 times sit to stand: 16sec from standard chair no UE support Functional gait assessment: 15/30; use of assistive device (heavy reliance noted). Difficulty w/ narrow BOS walking, changes in gait speed, increase time for turning. Baseline gait impairments as below 05/11/2022 6 min walk test - 685 ft   GAIT: Distance walked: within clinic Assistive device utilized: Single point cane Level of assistance: Modified independence Comments: somewhat widened BOS, reduced step length B, reduced truncal rotation, increased lateral weight shifting       TODAY'S TREATMENT:   [facility] Adult PT Treatment:                                                DATE: 05/22/2022 Therapeutic Exercise: Nu-step L 5 x 5 min LE only Resisted cable walking 1 x 8 forward, Lateral stepping 1 x 4 bil with 7# Leg press 1 x 10 with 20#, 1 x 10 with 40#, 2 x 10 with 60# Dead lift from 8 inch step 3 x 12 with 10# KB - verbal cues for proper form to avoid rounding back Hip hinge from 8 in step 2 x 10    [facility] Adult PT Treatment:                                                 DATE: 05/17/2022 Therapeutic Exercise: Nu-step L 5 x 5 min LE only Sit to stand with 15# KB 3 x 10 with 3 sec hoover   Lunge touching down on bos  with LLE - modified to mini lunge due to  knee pain 1 x 10 bil LAQ with hip flexion 2 x going to fatigue with 5# Neuromuscular re-ed: Marching in airex pad 3 x 20 Standing on fitter rocker board 3 x 10 to promote ankle strategy    [facility] Adult PT Treatment:                                     DATE: 05/15/22 Therapeutic Exercise: Standing hip abd superset with extension, 2x8 each LE, at counter for UE support Standing heel raises at counter x16, UE support as needed Cone taps B, 2# ankle weights x20, UE support on counter 4 inch step ups 2x8 unilat UE support, cues to maintain movement in sagittal plane 15# KB STS x10 from lowest mat, 20# DB 2x5 STS from mat  Neuromuscular re-ed: 4 laps 4 cones weaving, airex step over, airex beam, CGA, HHA provided on beam 4 cone weaving fwd/back, SBA-CGA, cues for fwd velocity and reduced trunk lean with retro walking Static stance dynadisc at counter CGA-minA, 3 bouts to fatigue (ranging 5-20sec) for improved postural stability    PATIENT EDUCATION:  Education details: HEP, rationale for interventions, FOTO, progress thus far Person educated: Patient Education method: Explanation, Demonstration, Tactile cues, Verbal cues Education comprehension: verbalized understanding, returned demonstration, verbal cues required, tactile cues required, and needs further education      HOME EXERCISE PROGRAM: Access Code: [email or web address] URL: [email or web address] Date: 05/09/2022 Prepared by: Voncille Lo  Exercises - Sit to Stand with Armchair  - 1 x daily - 7 x weekly - 3 sets - 5 reps - Standing Hip Extension with Counter Support  - 1 x daily - 7 x weekly - 3 sets - 10 reps - standing 3 way with hands on back of chair  - 1 x daily - 7 x weekly - 3 sets - 10 reps    ASSESSMENT:   CLINICAL IMPRESSION: Patient arrives to PT with no report of pain or issues. Continued working on LE strengthenign with resisted walking forward  and laterally, she did very well with the leg press, and practiced lifting from an elevated position (due to stiffness/ aggrivation of the L knee)dead lift and hip hinge. End of session she reported feeling good. She met all short term goals today.    OBJECTIVE IMPAIRMENTS: Abnormal gait, decreased activity tolerance, decreased balance, decreased coordination, decreased endurance, decreased mobility, difficulty walking, decreased strength, improper body mechanics, postural dysfunction, and obesity.    ACTIVITY LIMITATIONS: carrying, lifting, bending, standing, squatting, stairs, and transfers   PARTICIPATION LIMITATIONS: meal prep, cleaning, laundry, shopping, community activity, and yard work   PERSONAL FACTORS: Age and Fitness are also affecting patient's functional outcome.    REHAB POTENTIAL: Good   CLINICAL DECISION MAKING: Evolving/moderate complexity   EVALUATION COMPLEXITY: Moderate     GOALS: Goals reviewed with patient? No   SHORT TERM GOALS: Target date: 05/25/2022   Pt will demonstrate appropriate understanding and performance of initially prescribed HEP in order to facilitate improved independence with management of symptoms.  Baseline: HEP provided on eval Goal status: met 05/22/2022   2. Pt will score greater than or equal to 44% on FOTO in order to demonstrate improved perception of function due to symptoms.            Baseline: 38% 05/15/22: 48%  Goal status: MET   LONG TERM GOALS: Target date: 06/22/2022   Pt will score 50% on FOTO in order to demonstrate improved perception of functional status due to symptoms.  Baseline: 38% Goal status: INITIAL   2.  Pt will score greater than or equal to 22/30 on Functional Gait assessment in order to indicate reduced fall risk (cutoff score </= 22/30  predictive of falls per Ernestine Conrad et al 2010, MCID 4 pts Beninato et al 2014)            Baseline: 15/30            Goal status: INITIAL    4. Pt will perform 5xSTS in <13 sec in order to demonstrate reduced fall risk and improved functional independence. (MCID of 2.3sec)            Baseline: 16sec standard chair no UE support            Goal status: INITIAL      PLAN: PT FREQUENCY: 2x/week   PT DURATION: 6 weeks   PLANNED INTERVENTIONS: Therapeutic exercises, Therapeutic activity, Neuromuscular re-education, Balance training, Gait training, Patient/Family education, Self Care, Stair training, Vestibular training, DME instructions, Aquatic Therapy, Manual therapy, and Re-evaluation.   PLAN FOR NEXT SESSION:   continue with strength/balance program with emphasis on improved clearance, lateral weight shifting, and dual tasking  Laurina Fischl PT, DPT, LAT, ATC  05/22/22  11:02 AM

## 2022-05-24 ENCOUNTER — Ambulatory Visit: Payer: Medicare Other | Admitting: Physical Therapy

## 2022-05-24 DIAGNOSIS — M6281 Muscle weakness (generalized): Secondary | ICD-10-CM | POA: Diagnosis not present

## 2022-05-24 DIAGNOSIS — R296 Repeated falls: Secondary | ICD-10-CM | POA: Diagnosis not present

## 2022-05-24 DIAGNOSIS — R2689 Other abnormalities of gait and mobility: Secondary | ICD-10-CM

## 2022-05-24 NOTE — Therapy (Signed)
OUTPATIENT PHYSICAL THERAPY TREATMENT NOTE   Patient Name: Tammy [name] MRN: [id number] DOB:May 19, 1951, 71 y.o., female Today's Date: 05/24/2022  PCP: Ginger Organ MD   REFERRING PROVIDER: Ginger Organ MD  END OF SESSION:   PT End of Session - 05/24/22 1024     Visit Number 9    Number of Visits 13    Date for PT Re-Evaluation 06/22/22    Authorization Type Medicare AB    Authorization Time Period foto 9, kx mod 15    Progress Note Due on Visit 10    PT Start Time 1017    PT Stop Time 1100    PT Time Calculation (min) 43 min                   Past Medical History:  Diagnosis Date   Agatston coronary artery calcium score greater than 400    Arrhythmia    atrial fibrillation   CHF (congestive heart failure) ([place])    Diabetes mellitus ([place])    x 3 years   DJD (degenerative joint disease)    Encephalitis    Fibromyalgia    HTN (hypertension)    Hyperlipidemia    Sleep apnea    No CPAP   Past Surgical History:  Procedure Laterality Date   APPENDECTOMY     BREAST CYST EXCISION     BREAST EXCISIONAL BIOPSY Left 2004   COLONOSCOPY WITH PROPOFOL N/A 11/26/2020   Procedure: COLONOSCOPY WITH PROPOFOL;  Surgeon: Carol Ada, MD;  [facility];  Service: Endoscopy;  Laterality: N/A;   HEMOSTASIS CLIP PLACEMENT  11/26/2020   Procedure: HEMOSTASIS CLIP PLACEMENT;  Surgeon: Carol Ada, MD;  [facility];  Service: Endoscopy;;   KNEE ARTHROSCOPY     POLYPECTOMY  11/26/2020   Procedure: POLYPECTOMY;  Surgeon: Carol Ada, MD;  [facility];  Service: Endoscopy;;   SUBMUCOSAL TATTOO INJECTION  11/26/2020   Procedure: SUBMUCOSAL TATTOO INJECTION;  Surgeon: Carol Ada, MD;  [facility];  Service: Endoscopy;;   TEE WITHOUT CARDIOVERSION N/A 06/29/2020   Procedure: TRANSESOPHAGEAL ECHOCARDIOGRAM (TEE) with DCCV;  Surgeon: Dionisio David, MD;  Location: [facility];  Service: Cardiovascular;  Laterality:  N/A;   Patient Active Problem List   Diagnosis Date Noted   COPD suggested by initial evaluation ([place]) 04/20/2022   Nodule of lower lobe of left lung 11/11/2021   Fatigue 12/30/2020   Elevated coronary artery calcium score 12/30/2020   Unilateral primary osteoarthritis, left knee 10/06/2020   Respiratory failure, acute ([place]) 02/63/7858   Acute diastolic CHF (congestive heart failure) ([place]) 06/28/2020   Atrial fibrillation with rapid ventricular response ([place]) 06/28/2020   Acquired thrombophilia ([place])    Depression    Atrial fibrillation with RVR ([place]) 06/16/2020   Diabetes mellitus ([place])    HTN (hypertension)    Sleep apnea    Obesity, Class III, BMI 40-49.9 (morbid obesity) ([place])    Chronic venous insufficiency 12/30/2018   Varicose veins of both lower extremities with inflammation 12/30/2018   DJD (degenerative joint disease) 12/30/2018   Snoring 11/27/2018   Daytime sleepiness 11/27/2018   Educated about COVID-19 virus infection 11/27/2018   SOB (shortness of breath) 11/27/2018   Hyperlipidemia 05/20/2015   Knee pain 06/06/2012    REFERRING DIAG: Repeated falls  THERAPY DIAG:  Repeated falls  Muscle weakness (generalized)  Other abnormalities of gait and mobility  Rationale for Evaluation and Treatment Rehabilitation  PERTINENT HISTORY: HTN, venous insufficiency, AfibRVR, depression,  obesity  PRECAUTIONS: Fall, cardiac hx  SUBJECTIVE:  "I did good after last session, no pan now."   PAIN:  Are you having pain? Yes: NPRS scale: 0/10 Pain location: bil knees Pain description: ache Aggravating factors: bending the knees too much Relieving factors: getting out of position     OBJECTIVE: (objective measures completed at initial evaluation unless otherwise dated)   DIAGNOSTIC FINDINGS:  None recently   PATIENT SURVEYS:  FOTO 38% FOTO 48% 05/15/22   COGNITION:           Overall cognitive status: Within functional limits for tasks assessed                           SENSATION/NEURO: Light touch intact all extremities Resting tremors RUE, pt reports also intention tremor although no ataxia on finger<>chin testing (pt states present about 6 months and MD is aware) Increased difficulty with serial opposition RUE compared to L Unremarkable dysdiadochokinesia testing     POSTURE: rounded shoulders, forward head, and decreased lumbar lordosis      LOWER EXTREMITY MMT:     MMT Right eval Left eval  Hip flexion 5 5  Hip abduction (modified sitting) 5 5  Hip internal rotation      Hip external rotation      Knee flexion 5 5  Knee extension 4+ 4+   (Blank rows = not tested)  05/24/22: AROM : Right knee flexion 90, left knee flexion 103 Comments: grip strength mildly reduced on L compared to R but both intact       FUNCTIONAL TESTS:  5 times sit to stand: 16sec from standard chair no UE support:  Functional gait assessment: 15/30; use of assistive device (heavy reliance noted). Difficulty w/ narrow BOS walking, changes in gait speed, increase time for turning. Baseline gait impairments as below 05/11/2022 6 min walk test - 685 ft   GAIT: Distance walked: within clinic Assistive device utilized: Single point cane Level of assistance: Modified independence Comments: somewhat widened BOS, reduced step length B, reduced truncal rotation, increased lateral weight shifting       TODAY'S TREATMENT:  [facility] Adult PT Treatment:                                                DATE: 05/24/2022 Therapeutic Exercise: Nu-step L 5 x 6 min LE only Dead lift from 8 inch step 3 x 10 with 10# KB -last set with 15#  STS 15# KB - cues for controlled descent 10 x 2 4 inch step ups x 10 each 4 inch alternating step taps no UE 10 x 2  Hip flexor stretch EOM Hamstring  stretch with strap  AAROM heel slide right with strap   [facility] Adult PT Treatment:                                                DATE: 05/22/2022 Therapeutic Exercise: Nu-step L 5 x 5  min LE only Resisted cable walking 1 x 8 forward, Lateral stepping 1 x 4 bil with 7# Leg press 1 x 10 with 20#, 1 x 10 with 40#, 2 x 10 with 60# Dead lift from 8 inch  step 3 x 12 with 10# KB - verbal cues for proper form to avoid rounding back Hip hinge from 8 in step 2 x 10    [facility] Adult PT Treatment:                                                DATE: 05/17/2022 Therapeutic Exercise: Nu-step L 5 x 5 min LE only Sit to stand with 15# KB 3 x 10 with 3 sec hoover   Lunge touching down on bos with LLE - modified to mini lunge due to  knee pain 1 x 10 bil LAQ with hip flexion 2 x going to fatigue with 5# Neuromuscular re-ed: Marching in airex pad 3 x 20 Standing on fitter rocker board 3 x 10 to promote ankle strategy    [facility] Adult PT Treatment:                                     DATE: 05/15/22 Therapeutic Exercise: Standing hip abd superset with extension, 2x8 each LE, at counter for UE support Standing heel raises at counter x16, UE support as needed Cone taps B, 2# ankle weights x20, UE support on counter 4 inch step ups 2x8 unilat UE support, cues to maintain movement in sagittal plane 15# KB STS x10 from lowest mat, 20# DB 2x5 STS from mat  Neuromuscular re-ed: 4 laps 4 cones weaving, airex step over, airex beam, CGA, HHA provided on beam 4 cone weaving fwd/back, SBA-CGA, cues for fwd velocity and reduced trunk lean with retro walking Static stance dynadisc at counter CGA-minA, 3 bouts to fatigue (ranging 5-20sec) for improved postural stability    PATIENT EDUCATION:  Education details: HEP, rationale for interventions, FOTO, progress thus far Person educated: Patient Education method: Explanation, Demonstration, Tactile cues, Verbal cues Education comprehension: verbalized understanding, returned demonstration, verbal cues required, tactile cues required, and needs further education      HOME EXERCISE PROGRAM: Access Code: [email or web address] URL:  [email or web address] Date: 05/09/2022 Prepared by: Voncille Lo  Exercises - Sit to Stand with Armchair  - 1 x daily - 7 x weekly - 3 sets - 5 reps - Standing Hip Extension with Counter Support  - 1 x daily - 7 x weekly - 3 sets - 10 reps - standing 3 way with hands on back of chair  - 1 x daily - 7 x weekly - 3 sets - 10 reps   ASSESSMENT:   CLINICAL IMPRESSION: Patient arrives to PT with no report of pain or issues. Continued working on LE strengthening and hip hinging with resistance. Added hip flexor stretching today with pt reporting a good stretch.  End of session she reported feeling good.    OBJECTIVE IMPAIRMENTS: Abnormal gait, decreased activity tolerance, decreased balance, decreased coordination, decreased endurance, decreased mobility, difficulty walking, decreased strength, improper body mechanics, postural dysfunction, and obesity.    ACTIVITY LIMITATIONS: carrying, lifting, bending, standing, squatting, stairs, and transfers   PARTICIPATION LIMITATIONS: meal prep, cleaning, laundry, shopping, community activity, and yard work   PERSONAL FACTORS: Age and Fitness are also affecting patient's functional outcome.    REHAB POTENTIAL: Good   CLINICAL DECISION MAKING: Evolving/moderate complexity   EVALUATION COMPLEXITY: Moderate     GOALS: Goals reviewed with patient? No   SHORT  TERM GOALS: Target date: 05/25/2022   Pt will demonstrate appropriate understanding and performance of initially prescribed HEP in order to facilitate improved independence with management of symptoms.  Baseline: HEP provided on eval Goal status: met 05/22/2022   2. Pt will score greater than or equal to 44% on FOTO in order to demonstrate improved perception of function due to symptoms.            Baseline: 38% 05/15/22: 48%            Goal status: MET   LONG TERM GOALS: Target date: 06/22/2022   Pt will score 50% on FOTO in order to demonstrate improved perception of  functional status due to symptoms.  Baseline: 38% Goal status: INITIAL   2.  Pt will score greater than or equal to 22/30 on Functional Gait assessment in order to indicate reduced fall risk (cutoff score </= 22/30 predictive of falls per Ernestine Conrad et al 2010, MCID 4 pts Beninato et al 2014)            Baseline: 15/30            Goal status: INITIAL    4. Pt will perform 5xSTS in <13 sec in order to demonstrate reduced fall risk and improved functional independence. (MCID of 2.3sec)            Baseline: 16sec standard chair no UE support            Goal status: INITIAL      PLAN: PT FREQUENCY: 2x/week   PT DURATION: 6 weeks   PLANNED INTERVENTIONS: Therapeutic exercises, Therapeutic activity, Neuromuscular re-education, Balance training, Gait training, Patient/Family education, Self Care, Stair training, Vestibular training, DME instructions, Aquatic Therapy, Manual therapy, and Re-evaluation.   PLAN FOR NEXT SESSION:  stretch hip flexors, improve knee flexion ROM,  continue with strength/balance program with emphasis on improved clearance, lateral weight shifting, and dual tasking  Hessie Diener, PTA 05/24/22 1:53 PM Phone: [phone] Fax: [phone]

## 2022-05-25 DIAGNOSIS — D1801 Hemangioma of skin and subcutaneous tissue: Secondary | ICD-10-CM | POA: Diagnosis not present

## 2022-05-25 DIAGNOSIS — L814 Other melanin hyperpigmentation: Secondary | ICD-10-CM | POA: Diagnosis not present

## 2022-05-25 DIAGNOSIS — L821 Other seborrheic keratosis: Secondary | ICD-10-CM | POA: Diagnosis not present

## 2022-05-25 DIAGNOSIS — L570 Actinic keratosis: Secondary | ICD-10-CM | POA: Diagnosis not present

## 2022-05-29 ENCOUNTER — Encounter: Payer: Self-pay | Admitting: Physical Therapy

## 2022-05-29 ENCOUNTER — Ambulatory Visit: Payer: Medicare Other | Admitting: Physical Therapy

## 2022-05-29 DIAGNOSIS — R296 Repeated falls: Secondary | ICD-10-CM

## 2022-05-29 DIAGNOSIS — R2689 Other abnormalities of gait and mobility: Secondary | ICD-10-CM

## 2022-05-29 DIAGNOSIS — M6281 Muscle weakness (generalized): Secondary | ICD-10-CM | POA: Diagnosis not present

## 2022-05-29 NOTE — Therapy (Signed)
OUTPATIENT PHYSICAL THERAPY PROGRESS NOTE + TREATMENT NOTE   Patient Name: Tammy [name] MRN: [id number] DOB:24-Jan-1951, 71 y.o., female Today's Date: 05/29/2022  Progress Note Reporting Period 04/27/22 to 05/29/22  See note below for Objective Data and Assessment of Progress/Goals.      PCP: Ginger Organ MD   REFERRING PROVIDER: Ginger Organ MD  END OF SESSION:   PT End of Session - 05/29/22 1000     Visit Number 10    Number of Visits 13    Date for PT Re-Evaluation 06/22/22    Authorization Type Medicare AB    Authorization Time Period kx mod 15    Progress Note Due on Visit 20    PT Start Time 1001    PT Stop Time 1050    PT Time Calculation (min) 49 min    Activity Tolerance Patient tolerated treatment well    Behavior During Therapy WFL for tasks assessed/performed                    Past Medical History:  Diagnosis Date   Agatston coronary artery calcium score greater than 400    Arrhythmia    atrial fibrillation   CHF (congestive heart failure) (HCC)    Diabetes mellitus ([place])    x 3 years   DJD (degenerative joint disease)    Encephalitis    Fibromyalgia    HTN (hypertension)    Hyperlipidemia    Sleep apnea    No CPAP   Past Surgical History:  Procedure Laterality Date   APPENDECTOMY     BREAST CYST EXCISION     BREAST EXCISIONAL BIOPSY Left 2004   COLONOSCOPY WITH PROPOFOL N/A 11/26/2020   Procedure: COLONOSCOPY WITH PROPOFOL;  Surgeon: Carol Ada, MD;  Location: [facility];  Service: Endoscopy;  Laterality: N/A;   HEMOSTASIS CLIP PLACEMENT  11/26/2020   Procedure: HEMOSTASIS CLIP PLACEMENT;  Surgeon: Carol Ada, MD;  Location: [facility];  Service: Endoscopy;;   KNEE ARTHROSCOPY     POLYPECTOMY  11/26/2020   Procedure: POLYPECTOMY;  Surgeon: Carol Ada, MD;  [facility]: [facility];  Service: Endoscopy;;   SUBMUCOSAL TATTOO INJECTION  11/26/2020   Procedure: SUBMUCOSAL TATTOO INJECTION;  Surgeon: Carol Ada, MD;  [facility]: [facility];  Service: Endoscopy;;   TEE WITHOUT CARDIOVERSION N/A 06/29/2020   Procedure: TRANSESOPHAGEAL ECHOCARDIOGRAM (TEE) with DCCV;  Surgeon: Dionisio Emmons Toth, MD;  Location: [facility];  Service: Cardiovascular;  Laterality: N/A;   Patient Active Problem List   Diagnosis Date Noted   COPD suggested by initial evaluation ([place]) 04/20/2022   Nodule of lower lobe of left lung 11/11/2021   Fatigue 12/30/2020   Elevated coronary artery calcium score 12/30/2020   Unilateral primary osteoarthritis, left knee 10/06/2020   Respiratory failure, acute ([place]) 82/99/3716   Acute diastolic CHF (congestive heart failure) ([place]) 06/28/2020   Atrial fibrillation with rapid ventricular response ([place]) 06/28/2020   Acquired thrombophilia ([place])    Depression    Atrial fibrillation with RVR ([place]) 06/16/2020   Diabetes mellitus ([place])    HTN (hypertension)    Sleep apnea    Obesity, Class III, BMI 40-49.9 (morbid obesity) ([place])    Chronic venous insufficiency 12/30/2018   Varicose veins of both lower extremities with inflammation 12/30/2018   DJD (degenerative joint disease) 12/30/2018   Snoring 11/27/2018   Daytime sleepiness 11/27/2018   Educated about COVID-19 virus infection 11/27/2018   SOB (shortness of breath) 11/27/2018   Hyperlipidemia 05/20/2015  Knee pain 06/06/2012    REFERRING DIAG: Repeated falls  THERAPY DIAG:  Repeated falls  Muscle weakness (generalized)  Other abnormalities of gait and mobility  Rationale for Evaluation and Treatment Rehabilitation  PERTINENT HISTORY: HTN, venous insufficiency, AfibRVR, depression, obesity  PRECAUTIONS: Fall, cardiac hx  SUBJECTIVE:  Pt arrives and states she has been doing quite well - was walking in a family members yard which had hills and uneven surfaces, felt pretty steady but did have some knee pain after. Has not had any falls since starting therapy, states she has not felt the need to use her cane.     PAIN:  Are you having pain? Yes: NPRS scale: 0/10 Pain location: bil knees Pain description: ache Aggravating factors: bending the knees too much Relieving factors: getting out of position     OBJECTIVE: (objective measures completed at initial evaluation unless otherwise dated)   DIAGNOSTIC FINDINGS:  None recently   PATIENT SURVEYS:  FOTO 38% FOTO 48% 05/15/22 FOTO 56% 05/29/22   COGNITION:           Overall cognitive status: Within functional limits for tasks assessed                          SENSATION/NEURO: Light touch intact all extremities Resting tremors RUE, pt reports also intention tremor although no ataxia on finger<>chin testing (pt states present about 6 months and MD is aware) Increased difficulty with serial opposition RUE compared to L Unremarkable dysdiadochokinesia testing     POSTURE: rounded shoulders, forward head, and decreased lumbar lordosis      LOWER EXTREMITY MMT:     MMT Right eval Left eval  Hip flexion 5 5  Hip abduction (modified sitting) 5 5  Hip internal rotation      Hip external rotation      Knee flexion 5 5  Knee extension 4+ 4+   (Blank rows = not tested)  05/24/22: AROM : Right knee flexion 90, left knee flexion 103 Comments: grip strength mildly reduced on L compared to R but both intact       FUNCTIONAL TESTS:  5 times sit to stand: 16sec from standard chair no UE support:  Functional gait assessment: 15/30; use of assistive device (heavy reliance noted). Difficulty w/ narrow BOS walking, changes in gait speed, increase time for turning. Baseline gait impairments as below 05/11/2022 6 min walk test - 685 ft 05/29/22: 5xSTS 11sec standard chair no UE support 05/29/22: Functional Gait Assessment: 19/30, unable to do narrow BOS, changes in gait speed, rail use reciprocal, mild veering with head turns to L, no AD use today   GAIT: Distance walked: within clinic Assistive device utilized: Single point cane Level  of assistance: Modified independence Comments: somewhat widened BOS, reduced step length B, reduced truncal rotation, increased lateral weight shifting       TODAY'S TREATMENT:  [facility] Adult PT Treatment:                                                DATE: 05/29/22 Therapeutic Exercise: STS 5x BW, 20# DB 3x8 standard chair 4 inch step ups x10 each LE 15# KB deadlift from 8inch step, 3x8 cues for form and hinge  6inch taps 2x8 each LE, CGA no UE support  6inch weight shifts 2x10 each LE, cues for  form and control 7# belt walkouts at cable column; 5 reps fwd, 3 reps B sidestepping Education re: therex, HEP, and tests/measures   [facility] Adult PT Treatment:                                                DATE: 05/24/2022 Therapeutic Exercise: Nu-step L 5 x 6 min LE only Dead lift from 8 inch step 3 x 10 with 10# KB -last set with 15#  STS 15# KB - cues for controlled descent 10 x 2 4 inch step ups x 10 each 4 inch alternating step taps no UE 10 x 2  Hip flexor stretch EOM Hamstring  stretch with strap  AAROM heel slide right with strap   [facility] Adult PT Treatment:                                                DATE: 05/22/2022 Therapeutic Exercise: Nu-step L 5 x 5 min LE only Resisted cable walking 1 x 8 forward, Lateral stepping 1 x 4 bil with 7# Leg press 1 x 10 with 20#, 1 x 10 with 40#, 2 x 10 with 60# Dead lift from 8 inch step 3 x 12 with 10# KB - verbal cues for proper form to avoid rounding back Hip hinge from 8 in step 2 x 10       PATIENT EDUCATION:  Education details: HEP, rationale for interventions, FOTO, progress thus far Person educated: Patient Education method: Explanation, Demonstration, Tactile cues, Verbal cues Education comprehension: verbalized understanding, returned demonstration, verbal cues required, tactile cues required, and needs further education      HOME EXERCISE PROGRAM: Access Code: [email or web address] URL: [email or web address] Date:  05/09/2022 Prepared by: Voncille Lo  Exercises - Sit to Stand with Armchair  - 1 x daily - 7 x weekly - 3 sets - 5 reps - Standing Hip Extension with Counter Support  - 1 x daily - 7 x weekly - 3 sets - 10 reps - standing 3 way with hands on back of chair  - 1 x daily - 7 x weekly - 3 sets - 10 reps   ASSESSMENT:   CLINICAL IMPRESSION: Pt arrives to PT without issue, states she is pleased with progress thus far which is reflected in FOTO score of 56% (predicted 50% at initial eval). 5xSTS improved notably compared to initial eval, 11sec today which is indicates she is no longer in fall risk category (cutoff score 12-14 sec most populations). FGA improved from 15/30 on eval to 19/30 today - still in fall risk category but has improved greater than MCID of 4 pts, pt also able to perform without AD on this date (SPC on eval). Goals addressed as below, pt making excellent progress. Pt tolerates treatment well, able to progress for resistance/intensity as noted above. Denies increase in pain, no adverse events. Pt departs today's session in no acute distress, all voiced questions/concerns addressed appropriately from PT perspective.      OBJECTIVE IMPAIRMENTS: Abnormal gait, decreased activity tolerance, decreased balance, decreased coordination, decreased endurance, decreased mobility, difficulty walking, decreased strength, improper body mechanics, postural dysfunction, and obesity.    ACTIVITY LIMITATIONS: carrying, lifting, bending, standing, squatting, stairs, and transfers   PARTICIPATION  LIMITATIONS: meal prep, cleaning, laundry, shopping, community activity, and yard work   PERSONAL FACTORS: Age and Fitness are also affecting patient's functional outcome.    REHAB POTENTIAL: Good   CLINICAL DECISION MAKING: Evolving/moderate complexity   EVALUATION COMPLEXITY: Moderate     GOALS: Goals reviewed with patient? No   SHORT TERM GOALS: Target date: 05/25/2022   Pt will demonstrate  appropriate understanding and performance of initially prescribed HEP in order to facilitate improved independence with management of symptoms.  Baseline: HEP provided on eval Goal status: met 05/22/2022   2. Pt will score greater than or equal to 44% on FOTO in order to demonstrate improved perception of function due to symptoms.            Baseline: 38% 05/15/22: 48%            Goal status: MET   LONG TERM GOALS: Target date: 06/22/2022   Pt will score 50% on FOTO in order to demonstrate improved perception of functional status due to symptoms.  Baseline: 38% 05/29/22: 56% Goal status: MET   2.  Pt will score greater than or equal to 22/30 on Functional Gait assessment in order to indicate reduced fall risk (cutoff score </= 22/30 predictive of falls per Ernestine Conrad et al 2010, MCID 4 pts Beninato et al 2014)            Baseline: 15/30  05/29/22: 19/30            Goal status: INITIAL    4. Pt will perform 5xSTS in <13 sec in order to demonstrate reduced fall risk and improved functional independence. (MCID of 2.3sec)            Baseline: 16sec standard chair no UE support  05/29/22: 11 sec            Goal status: MET      PLAN: PT FREQUENCY: 2x/week   PT DURATION: 6 weeks   PLANNED INTERVENTIONS: Therapeutic exercises, Therapeutic activity, Neuromuscular re-education, Balance training, Gait training, Patient/Family education, Self Care, Stair training, Vestibular training, DME instructions, Aquatic Therapy, Manual therapy, and Re-evaluation.   PLAN FOR NEXT SESSION:  continue strengthening/balance exercises as able/appropriate  Leeroy Cha PT, DPT 05/29/2022 10:58 AM

## 2022-05-31 ENCOUNTER — Encounter: Payer: Self-pay | Admitting: Physical Therapy

## 2022-05-31 ENCOUNTER — Ambulatory Visit: Payer: Medicare Other | Admitting: Physical Therapy

## 2022-05-31 DIAGNOSIS — R296 Repeated falls: Secondary | ICD-10-CM | POA: Diagnosis not present

## 2022-05-31 DIAGNOSIS — M6281 Muscle weakness (generalized): Secondary | ICD-10-CM | POA: Diagnosis not present

## 2022-05-31 DIAGNOSIS — R2689 Other abnormalities of gait and mobility: Secondary | ICD-10-CM | POA: Diagnosis not present

## 2022-05-31 NOTE — Therapy (Signed)
OUTPATIENT PHYSICAL THERAPY PROGRESS NOTE + TREATMENT NOTE   Patient Name: Mackensey Bolte MRN: [id number] DOB:Oct 25, 1950, 71 y.o., female Today's Date: 05/31/2022  Progress Note Reporting Period 04/27/22 to 05/29/22  See note below for Objective Data and Assessment of Progress/Goals.      PCP: Ginger Organ MD   REFERRING PROVIDER: Ginger Organ MD  END OF SESSION:   PT End of Session - 05/31/22 1018     Visit Number 11    Number of Visits 13    Date for PT Re-Evaluation 06/22/22    Authorization Type Medicare AB    Authorization Time Period kx mod 15    Progress Note Due on Visit 74    PT Start Time 1018    PT Stop Time 1100    PT Time Calculation (min) 42 min    Activity Tolerance Patient tolerated treatment well    Behavior During Therapy WFL for tasks assessed/performed                     Past Medical History:  Diagnosis Date   Agatston coronary artery calcium score greater than 400    Arrhythmia    atrial fibrillation   CHF (congestive heart failure) (HCC)    Diabetes mellitus ([place])    x 3 years   DJD (degenerative joint disease)    Encephalitis    Fibromyalgia    HTN (hypertension)    Hyperlipidemia    Sleep apnea    No CPAP   Past Surgical History:  Procedure Laterality Date   APPENDECTOMY     BREAST CYST EXCISION     BREAST EXCISIONAL BIOPSY Left 2004   COLONOSCOPY WITH PROPOFOL N/A 11/26/2020   Procedure: COLONOSCOPY WITH PROPOFOL;  Surgeon: Carol Ada, MD;  [facility];  Service: Endoscopy;  Laterality: N/A;   HEMOSTASIS CLIP PLACEMENT  11/26/2020   Procedure: HEMOSTASIS CLIP PLACEMENT;  Surgeon: Carol Ada, MD;  [facility];  Service: Endoscopy;;   KNEE ARTHROSCOPY     POLYPECTOMY  11/26/2020   Procedure: POLYPECTOMY;  Surgeon: Carol Ada, MD;  [facility];  Service: Endoscopy;;   SUBMUCOSAL TATTOO INJECTION  11/26/2020   Procedure: SUBMUCOSAL TATTOO INJECTION;  Surgeon:  Carol Ada, MD;  [facility];  Service: Endoscopy;;   TEE WITHOUT CARDIOVERSION N/A 06/29/2020   Procedure: TRANSESOPHAGEAL ECHOCARDIOGRAM (TEE) with DCCV;  Surgeon: Dionisio David, MD;  Location: [facility];  Service: Cardiovascular;  Laterality: N/A;   Patient Active Problem List   Diagnosis Date Noted   COPD suggested by initial evaluation ([place]) 04/20/2022   Nodule of lower lobe of left lung 11/11/2021   Fatigue 12/30/2020   Elevated coronary artery calcium score 12/30/2020   Unilateral primary osteoarthritis, left knee 10/06/2020   Respiratory failure, acute ([place]) 46/65/9935   Acute diastolic CHF (congestive heart failure) ([place]) 06/28/2020   Atrial fibrillation with rapid ventricular response ([place]) 06/28/2020   Acquired thrombophilia ([place])    Depression    Atrial fibrillation with RVR ([place]) 06/16/2020   Diabetes mellitus ([place])    HTN (hypertension)    Sleep apnea    Obesity, Class III, BMI 40-49.9 (morbid obesity) ([place])    Chronic venous insufficiency 12/30/2018   Varicose veins of both lower extremities with inflammation 12/30/2018   DJD (degenerative joint disease) 12/30/2018   Snoring 11/27/2018   Daytime sleepiness 11/27/2018   Educated about COVID-19 virus infection 11/27/2018   SOB (shortness of breath) 11/27/2018   Hyperlipidemia  05/20/2015   Knee pain 06/06/2012    REFERRING DIAG: Repeated falls  THERAPY DIAG:  Repeated falls  Muscle weakness (generalized)  Other abnormalities of gait and mobility  Rationale for Evaluation and Treatment Rehabilitation  PERTINENT HISTORY: HTN, venous insufficiency, AfibRVR, depression, obesity  PRECAUTIONS: Fall, cardiac hx  SUBJECTIVE:  "Doing good. I was able to do the heel / toe walking bare foot and did much better at home."    PAIN:  Are you having pain? Yes: NPRS scale: 0/10 Pain location: bil knees Pain description: ache Aggravating factors: bending the knees too much Relieving factors: getting  out of position     OBJECTIVE: (objective measures completed at initial evaluation unless otherwise dated)   DIAGNOSTIC FINDINGS:  None recently   PATIENT SURVEYS:  FOTO 38% FOTO 48% 05/15/22 FOTO 56% 05/29/22   COGNITION:           Overall cognitive status: Within functional limits for tasks assessed                          SENSATION/NEURO: Light touch intact all extremities Resting tremors RUE, pt reports also intention tremor although no ataxia on finger<>chin testing (pt states present about 6 months and MD is aware) Increased difficulty with serial opposition RUE compared to L Unremarkable dysdiadochokinesia testing     POSTURE: rounded shoulders, forward head, and decreased lumbar lordosis      LOWER EXTREMITY MMT:     MMT Right eval Left eval  Hip flexion 5 5  Hip abduction (modified sitting) 5 5  Hip internal rotation      Hip external rotation      Knee flexion 5 5  Knee extension 4+ 4+   (Blank rows = not tested)  05/24/22: AROM : Right knee flexion 90, left knee flexion 103 Comments: grip strength mildly reduced on L compared to R but both intact       FUNCTIONAL TESTS:  5 times sit to stand: 16sec from standard chair no UE support:  Functional gait assessment: 15/30; use of assistive device (heavy reliance noted). Difficulty w/ narrow BOS walking, changes in gait speed, increase time for turning. Baseline gait impairments as below 05/11/2022 6 min walk test - 685 ft 05/29/22: 5xSTS 11sec standard chair no UE support 05/29/22: Functional Gait Assessment: 19/30, unable to do narrow BOS, changes in gait speed, rail use reciprocal, mild veering with head turns to L, no AD use today   GAIT: Distance walked: within clinic Assistive device utilized: Single point cane Level of assistance: Modified independence Comments: somewhat widened BOS, reduced step length B, reduced truncal rotation, increased lateral weight shifting       TODAY'S TREATMENT:   [facility] Adult PT Treatment:                                                DATE: 05/31/2022 Therapeutic Exercise: Treadmill walking up hill L 5 x.8 x 6 min Standing calf stretch 2 x 30 sec Dead lift standing on airex pad from a 8 inch step 3 x 10 - verbal cues to bend knees more  Therapeutic Activity: Walking up / down steps with airex on floor, 4 inch step with airex pad, and 8 inch step with airex pad 1 x 5 using bil UE on //, 1 x 5 with LUE  in //, 1 x 5 with RUE in //, 1 x 5 with no UE assist requirng CGA for safety (mimicking walking up hill on unstable surface)    [facility] Adult PT Treatment:                                                DATE: 05/29/22 Therapeutic Exercise: STS 5x BW, 20# DB 3x8 standard chair 4 inch step ups x10 each LE 15# KB deadlift from 8inch step, 3x8 cues for form and hinge  6inch taps 2x8 each LE, CGA no UE support  6inch weight shifts 2x10 each LE, cues for form and control 7# belt walkouts at cable column; 5 reps fwd, 3 reps B sidestepping Education re: therex, HEP, and tests/measures   [facility] Adult PT Treatment:                                                DATE: 05/24/2022 Therapeutic Exercise: Nu-step L 5 x 6 min LE only Dead lift from 8 inch step 3 x 10 with 10# KB -last set with 15#  STS 15# KB - cues for controlled descent 10 x 2 4 inch step ups x 10 each 4 inch alternating step taps no UE 10 x 2  Hip flexor stretch EOM Hamstring  stretch with strap  AAROM heel slide right with strap     PATIENT EDUCATION:  Education details: HEP, rationale for interventions, FOTO, progress thus far Person educated: Patient Education method: Explanation, Demonstration, Tactile cues, Verbal cues Education comprehension: verbalized understanding, returned demonstration, verbal cues required, tactile cues required, and needs further education      HOME EXERCISE PROGRAM: Access Code: [email or web address] URL: [email or web address] Date: 05/09/2022 Prepared  by: Voncille Lo  Exercises - Sit to Stand with Armchair  - 1 x daily - 7 x weekly - 3 sets - 5 reps - Standing Hip Extension with Counter Support  - 1 x daily - 7 x weekly - 3 sets - 10 reps - standing 3 way with hands on back of chair  - 1 x daily - 7 x weekly - 3 sets - 10 reps   ASSESSMENT:   CLINICAL IMPRESSION: Mrs Roniyah continues to do well with PT. She reported difficulty with walking up hill feeling more unstable. Worked on uphill walking on the treadmill and climbing steps with airex pads in // to mimicking uphill walking with UE support and without UE support. Continued LE strengthening on airex pad which she did very well with!    OBJECTIVE IMPAIRMENTS: Abnormal gait, decreased activity tolerance, decreased balance, decreased coordination, decreased endurance, decreased mobility, difficulty walking, decreased strength, improper body mechanics, postural dysfunction, and obesity.    ACTIVITY LIMITATIONS: carrying, lifting, bending, standing, squatting, stairs, and transfers   PARTICIPATION LIMITATIONS: meal prep, cleaning, laundry, shopping, community activity, and yard work   PERSONAL FACTORS: Age and Fitness are also affecting patient's functional outcome.    REHAB POTENTIAL: Good   CLINICAL DECISION MAKING: Evolving/moderate complexity   EVALUATION COMPLEXITY: Moderate     GOALS: Goals reviewed with patient? No   SHORT TERM GOALS: Target date: 05/25/2022   Pt will demonstrate appropriate understanding and performance of initially prescribed HEP in order to facilitate improved  independence with management of symptoms.  Baseline: HEP provided on eval Goal status: met 05/22/2022   2. Pt will score greater than or equal to 44% on FOTO in order to demonstrate improved perception of function due to symptoms.            Baseline: 38% 05/15/22: 48%            Goal status: MET   LONG TERM GOALS: Target date: 06/22/2022   Pt will score 50% on FOTO in order to  demonstrate improved perception of functional status due to symptoms.  Baseline: 38% 05/29/22: 56% Goal status: MET   2.  Pt will score greater than or equal to 22/30 on Functional Gait assessment in order to indicate reduced fall risk (cutoff score </= 22/30 predictive of falls per Ernestine Conrad et al 2010, MCID 4 pts Beninato et al 2014)            Baseline: 15/30  05/29/22: 19/30            Goal status: Ongoing 05/29/2022   4. Pt will perform 5xSTS in <13 sec in order to demonstrate reduced fall risk and improved functional independence. (MCID of 2.3sec)            Baseline: 16sec standard chair no UE support  05/29/22: 11 sec            Goal status: MET      PLAN: PT FREQUENCY: 2x/week   PT DURATION: 6 weeks   PLANNED INTERVENTIONS: Therapeutic exercises, Therapeutic activity, Neuromuscular re-education, Balance training, Gait training, Patient/Family education, Self Care, Stair training, Vestibular training, DME instructions, Aquatic Therapy, Manual therapy, and Re-evaluation.   PLAN FOR NEXT SESSION:  continue strengthening/balance exercises as able/appropriate  Minnetta Sandora PT, DPT, LAT, ATC  05/31/22  11:17 AM

## 2022-06-02 DIAGNOSIS — I10 Essential (primary) hypertension: Secondary | ICD-10-CM | POA: Diagnosis not present

## 2022-06-02 DIAGNOSIS — E119 Type 2 diabetes mellitus without complications: Secondary | ICD-10-CM | POA: Diagnosis not present

## 2022-06-02 DIAGNOSIS — M170 Bilateral primary osteoarthritis of knee: Secondary | ICD-10-CM | POA: Diagnosis not present

## 2022-06-06 ENCOUNTER — Ambulatory Visit: Payer: Medicare Other | Admitting: Physical Therapy

## 2022-06-06 ENCOUNTER — Encounter: Payer: Self-pay | Admitting: Physical Therapy

## 2022-06-06 DIAGNOSIS — R2689 Other abnormalities of gait and mobility: Secondary | ICD-10-CM | POA: Diagnosis not present

## 2022-06-06 DIAGNOSIS — M6281 Muscle weakness (generalized): Secondary | ICD-10-CM | POA: Diagnosis not present

## 2022-06-06 DIAGNOSIS — R296 Repeated falls: Secondary | ICD-10-CM | POA: Diagnosis not present

## 2022-06-06 NOTE — Therapy (Signed)
OUTPATIENT PHYSICAL THERAPY TREATMENT NOTE   Patient Name: Tammy [name] MRN: [id number] DOB:11-19-1950, 71 y.o., female Today's Date: 06/06/2022    PCP: Ginger Organ MD   REFERRING PROVIDER: Ginger Organ MD  END OF SESSION:   PT End of Session - 06/06/22 1023     Visit Number 12    Number of Visits 13    Date for PT Re-Evaluation 06/22/22    Authorization Type Medicare AB    Authorization Time Period kx mod 15    Progress Note Due on Visit 17    PT Start Time 1022    PT Stop Time 1106    PT Time Calculation (min) 44 min    Activity Tolerance Patient tolerated treatment well    Behavior During Therapy WFL for tasks assessed/performed                      Past Medical History:  Diagnosis Date   Agatston coronary artery calcium score greater than 400    Arrhythmia    atrial fibrillation   CHF (congestive heart failure) ([place])    Diabetes mellitus ([place])    x 3 years   DJD (degenerative joint disease)    Encephalitis    Fibromyalgia    HTN (hypertension)    Hyperlipidemia    Sleep apnea    No CPAP   Past Surgical History:  Procedure Laterality Date   APPENDECTOMY     BREAST CYST EXCISION     BREAST EXCISIONAL BIOPSY Left 2004   COLONOSCOPY WITH PROPOFOL N/A 11/26/2020   Procedure: COLONOSCOPY WITH PROPOFOL;  Surgeon: Carol Ada, MD;  Location: [facility];  Service: Endoscopy;  Laterality: N/A;   HEMOSTASIS CLIP PLACEMENT  11/26/2020   Procedure: HEMOSTASIS CLIP PLACEMENT;  Surgeon: Carol Ada, MD;  [facility];  Service: Endoscopy;;   KNEE ARTHROSCOPY     POLYPECTOMY  11/26/2020   Procedure: POLYPECTOMY;  Surgeon: Carol Ada, MD;  [facility];  Service: Endoscopy;;   SUBMUCOSAL TATTOO INJECTION  11/26/2020   Procedure: SUBMUCOSAL TATTOO INJECTION;  Surgeon: Carol Ada, MD;  [facility];  Service: Endoscopy;;   TEE WITHOUT CARDIOVERSION N/A 06/29/2020   Procedure: TRANSESOPHAGEAL  ECHOCARDIOGRAM (TEE) with DCCV;  Surgeon: Dionisio David, MD;  Location: [facility];  Service: Cardiovascular;  Laterality: N/A;   Patient Active Problem List   Diagnosis Date Noted   COPD suggested by initial evaluation ([place]) 04/20/2022   Nodule of lower lobe of left lung 11/11/2021   Fatigue 12/30/2020   Elevated coronary artery calcium score 12/30/2020   Unilateral primary osteoarthritis, left knee 10/06/2020   Respiratory failure, acute ([place]) 30/94/0768   Acute diastolic CHF (congestive heart failure) ([place]) 06/28/2020   Atrial fibrillation with rapid ventricular response ([place]) 06/28/2020   Acquired thrombophilia ([place])    Depression    Atrial fibrillation with RVR ([place]) 06/16/2020   Diabetes mellitus ([place])    HTN (hypertension)    Sleep apnea    Obesity, Class III, BMI 40-49.9 (morbid obesity) ([place])    Chronic venous insufficiency 12/30/2018   Varicose veins of both lower extremities with inflammation 12/30/2018   DJD (degenerative joint disease) 12/30/2018   Snoring 11/27/2018   Daytime sleepiness 11/27/2018   Educated about COVID-19 virus infection 11/27/2018   SOB (shortness of breath) 11/27/2018   Hyperlipidemia 05/20/2015   Knee pain 06/06/2012    REFERRING DIAG: Repeated falls  THERAPY DIAG:  Repeated falls  Muscle weakness (generalized)  Other abnormalities of gait and mobility  Rationale for Evaluation and Treatment Rehabilitation  PERTINENT HISTORY: HTN, venous insufficiency, AfibRVR, depression, obesity  PRECAUTIONS: Fall, cardiac hx  SUBJECTIVE:  "I was able to go to my sisters and walk up/ down hill to the stables with no issues or problems."   PAIN:  Are you having pain? Yes: NPRS scale: 0/10 Pain location: bil knees Pain description: ache Aggravating factors: bending the knees too much Relieving factors: getting out of position     OBJECTIVE: (objective measures completed at initial evaluation unless otherwise dated)   DIAGNOSTIC FINDINGS:   None recently   PATIENT SURVEYS:  FOTO 38% FOTO 48% 05/15/22 FOTO 56% 05/29/22   COGNITION:           Overall cognitive status: Within functional limits for tasks assessed                          SENSATION/NEURO: Light touch intact all extremities Resting tremors RUE, pt reports also intention tremor although no ataxia on finger<>chin testing (pt states present about 6 months and MD is aware) Increased difficulty with serial opposition RUE compared to L Unremarkable dysdiadochokinesia testing     POSTURE: rounded shoulders, forward head, and decreased lumbar lordosis      LOWER EXTREMITY MMT:     MMT Right eval Left eval  Hip flexion 5 5  Hip abduction (modified sitting) 5 5  Hip internal rotation      Hip external rotation      Knee flexion 5 5  Knee extension 4+ 4+   (Blank rows = not tested)  05/24/22: AROM : Right knee flexion 90, left knee flexion 103 Comments: grip strength mildly reduced on L compared to R but both intact       FUNCTIONAL TESTS:  5 times sit to stand: 16sec from standard chair no UE support:  Functional gait assessment: 15/30; use of assistive device (heavy reliance noted). Difficulty w/ narrow BOS walking, changes in gait speed, increase time for turning. Baseline gait impairments as below 05/11/2022 6 min walk test - 685 ft 05/29/22: 5xSTS 11sec standard chair no UE support 05/29/22: Functional Gait Assessment: 19/30, unable to do narrow BOS, changes in gait speed, rail use reciprocal, mild veering with head turns to L, no AD use today   GAIT: Distance walked: within clinic Assistive device utilized: Single point cane Level of assistance: Modified independence Comments: somewhat widened BOS, reduced step length B, reduced truncal rotation, increased lateral weight shifting       TODAY'S TREATMENT:  [facility] Adult PT Treatment:                                                DATE: 06/06/2022 Therapeutic Exercise: Nu-Step L5 x 5 min LE  only Standing hip flexor stretch 1 x 30 sec Neuromuscular re-ed: Reactive balance training with RTB for posterior resistance with 8 colored dots and starting with single leg 1 going controlled motion x 10, bil, then for speed with additional color called for unexpected direction training, x 10 bil, then mutliple color for speed x 20 bil Marching in place with RTB around waist 2 x 20 Standing on airx pad with multiangle pertubation' 2 x 20 in rhomberg postion 1 x 20 in modified tandem position.  Ball tosses while standing on airex  pad 2 x 20 Swinging bat standing on airex pad 1 x 20    [facility] Adult PT Treatment:                                                DATE: 05/31/2022 Therapeutic Exercise: Treadmill walking up hill L 5 x.8 x 6 min Standing calf stretch 2 x 30 sec Dead lift standing on airex pad from a 8 inch step 3 x 10 - verbal cues to bend knees more  Therapeutic Activity: Walking up / down steps with airex on floor, 4 inch step with airex pad, and 8 inch step with airex pad 1 x 5 using bil UE on //, 1 x 5 with LUE in //, 1 x 5 with RUE in //, 1 x 5 with no UE assist requirng CGA for safety (mimicking walking up hill on unstable surface)    [facility] Adult PT Treatment:                                                DATE: 05/29/22 Therapeutic Exercise: STS 5x BW, 20# DB 3x8 standard chair 4 inch step ups x10 each LE 15# KB deadlift from 8inch step, 3x8 cues for form and hinge  6inch taps 2x8 each LE, CGA no UE support  6inch weight shifts 2x10 each LE, cues for form and control 7# belt walkouts at cable column; 5 reps fwd, 3 reps B sidestepping Education re: therex, HEP, and tests/measures    PATIENT EDUCATION:  Education details: HEP, rationale for interventions, FOTO, progress thus far Person educated: Patient Education method: Explanation, Demonstration, Tactile cues, Verbal cues Education comprehension: verbalized understanding, returned demonstration, verbal cues required,  tactile cues required, and needs further education      HOME EXERCISE PROGRAM: Access Code: [email or web address] URL: [email or web address] Date: 05/09/2022 Prepared by: Voncille Lo  Exercises - Sit to Stand with Armchair  - 1 x daily - 7 x weekly - 3 sets - 5 reps - Standing Hip Extension with Counter Support  - 1 x daily - 7 x weekly - 3 sets - 10 reps - standing 3 way with hands on back of chair  - 1 x daily - 7 x weekly - 3 sets - 10 reps   ASSESSMENT:   CLINICAL IMPRESSION: Tammy [name] reports improvement in balance and was able to navigate up/ down a hill at her sisters where previously she reported having difficulty with. Today focus was primarily on reactive balance and unexpected changes in direction with resistance. She did well noting some difficulty with side stepping against resistance requiring CGA for safety. Overall she is doing well anticipate discharge next session.     OBJECTIVE IMPAIRMENTS: Abnormal gait, decreased activity tolerance, decreased balance, decreased coordination, decreased endurance, decreased mobility, difficulty walking, decreased strength, improper body mechanics, postural dysfunction, and obesity.    ACTIVITY LIMITATIONS: carrying, lifting, bending, standing, squatting, stairs, and transfers   PARTICIPATION LIMITATIONS: meal prep, cleaning, laundry, shopping, community activity, and yard work   PERSONAL FACTORS: Age and Fitness are also affecting patient's functional outcome.    REHAB POTENTIAL: Good   CLINICAL DECISION MAKING: Evolving/moderate complexity   EVALUATION COMPLEXITY: Moderate     GOALS: Goals reviewed with patient?  No   SHORT TERM GOALS: Target date: 05/25/2022   Pt will demonstrate appropriate understanding and performance of initially prescribed HEP in order to facilitate improved independence with management of symptoms.  Baseline: HEP provided on eval Goal status: met 05/22/2022   2. Pt will score greater than or  equal to 44% on FOTO in order to demonstrate improved perception of function due to symptoms.            Baseline: 38% 05/15/22: 48%            Goal status: MET   LONG TERM GOALS: Target date: 06/22/2022   Pt will score 50% on FOTO in order to demonstrate improved perception of functional status due to symptoms.  Baseline: 38% 05/29/22: 56% Goal status: MET   2.  Pt will score greater than or equal to 22/30 on Functional Gait assessment in order to indicate reduced fall risk (cutoff score </= 22/30 predictive of falls per Ernestine Conrad et al 2010, MCID 4 pts Beninato et al 2014)            Baseline: 15/30  05/29/22: 19/30            Goal status: Ongoing 05/29/2022   4. Pt will perform 5xSTS in <13 sec in order to demonstrate reduced fall risk and improved functional independence. (MCID of 2.3sec)            Baseline: 16sec standard chair no UE support  05/29/22: 11 sec            Goal status: MET      PLAN: PT FREQUENCY: 2x/week   PT DURATION: 6 weeks   PLANNED INTERVENTIONS: Therapeutic exercises, Therapeutic activity, Neuromuscular re-education, Balance training, Gait training, Patient/Family education, Self Care, Stair training, Vestibular training, DME instructions, Aquatic Therapy, Manual therapy, and Re-evaluation.   PLAN FOR NEXT SESSION:  continue strengthening/balance exercises as able/appropriate  Tammy [name] PT, DPT, LAT, ATC  06/06/22  11:15 AM

## 2022-06-07 ENCOUNTER — Encounter: Payer: Self-pay | Admitting: Physical Therapy

## 2022-06-07 ENCOUNTER — Ambulatory Visit: Payer: Medicare Other | Admitting: Physical Therapy

## 2022-06-07 DIAGNOSIS — R296 Repeated falls: Secondary | ICD-10-CM | POA: Diagnosis not present

## 2022-06-07 DIAGNOSIS — M6281 Muscle weakness (generalized): Secondary | ICD-10-CM

## 2022-06-07 DIAGNOSIS — R2689 Other abnormalities of gait and mobility: Secondary | ICD-10-CM

## 2022-06-07 NOTE — Therapy (Addendum)
OUTPATIENT PHYSICAL THERAPY TREATMENT NOTE / DISCHARGE   Patient Name: Tammy [name] MRN: [id number] DOB:1950-07-23, 71 y.o., female Today's Date: 06/07/2022    PCP: Ginger Organ MD   REFERRING PROVIDER: Ginger Organ MD  END OF SESSION:   PT End of Session - 06/07/22 1018     Visit Number 13    Number of Visits 13    Date for PT Re-Evaluation 06/22/22    Authorization Type Medicare AB    Progress Note Due on Visit 22    PT Start Time 1018    PT Stop Time 1050    PT Time Calculation (min) 32 min    Activity Tolerance Patient tolerated treatment well    Behavior During Therapy WFL for tasks assessed/performed                       Past Medical History:  Diagnosis Date   Agatston coronary artery calcium score greater than 400    Arrhythmia    atrial fibrillation   CHF (congestive heart failure) ([place])    Diabetes mellitus ([place])    x 3 years   DJD (degenerative joint disease)    Encephalitis    Fibromyalgia    HTN (hypertension)    Hyperlipidemia    Sleep apnea    No CPAP   Past Surgical History:  Procedure Laterality Date   APPENDECTOMY     BREAST CYST EXCISION     BREAST EXCISIONAL BIOPSY Left 2004   COLONOSCOPY WITH PROPOFOL N/A 11/26/2020   Procedure: COLONOSCOPY WITH PROPOFOL;  Surgeon: Carol Ada, MD;  [facility];  Service: Endoscopy;  Laterality: N/A;   HEMOSTASIS CLIP PLACEMENT  11/26/2020   Procedure: HEMOSTASIS CLIP PLACEMENT;  Surgeon: Carol Ada, MD;  [facility];  Service: Endoscopy;;   KNEE ARTHROSCOPY     POLYPECTOMY  11/26/2020   Procedure: POLYPECTOMY;  Surgeon: Carol Ada, MD;  [facility];  Service: Endoscopy;;   SUBMUCOSAL TATTOO INJECTION  11/26/2020   Procedure: SUBMUCOSAL TATTOO INJECTION;  Surgeon: Carol Ada, MD;  [facility];  Service: Endoscopy;;   TEE WITHOUT CARDIOVERSION N/A 06/29/2020   Procedure: TRANSESOPHAGEAL ECHOCARDIOGRAM (TEE) with DCCV;   Surgeon: Dionisio David, MD;  Location: [facility];  Service: Cardiovascular;  Laterality: N/A;   Patient Active Problem List   Diagnosis Date Noted   COPD suggested by initial evaluation ([place]) 04/20/2022   Nodule of lower lobe of left lung 11/11/2021   Fatigue 12/30/2020   Elevated coronary artery calcium score 12/30/2020   Unilateral primary osteoarthritis, left knee 10/06/2020   Respiratory failure, acute ([place]) 67/06/4579   Acute diastolic CHF (congestive heart failure) ([place]) 06/28/2020   Atrial fibrillation with rapid ventricular response ([place]) 06/28/2020   Acquired thrombophilia ([place])    Depression    Atrial fibrillation with RVR ([place]) 06/16/2020   Diabetes mellitus ([place])    HTN (hypertension)    Sleep apnea    Obesity, Class III, BMI 40-49.9 (morbid obesity) ([place])    Chronic venous insufficiency 12/30/2018   Varicose veins of both lower extremities with inflammation 12/30/2018   DJD (degenerative joint disease) 12/30/2018   Snoring 11/27/2018   Daytime sleepiness 11/27/2018   Educated about COVID-19 virus infection 11/27/2018   SOB (shortness of breath) 11/27/2018   Hyperlipidemia 05/20/2015   Knee pain 06/06/2012    REFERRING DIAG: Repeated falls  THERAPY DIAG:  Repeated falls  Muscle weakness (generalized)  Other abnormalities of gait and  mobility  Rationale for Evaluation and Treatment Rehabilitation  PERTINENT HISTORY: HTN, venous insufficiency, AfibRVR, depression, obesity  PRECAUTIONS: Fall, cardiac hx  SUBJECTIVE:  " I am doing pretty good, some soreness int he left leg from the last session."   PAIN:  Are you having pain? Yes: NPRS scale: 0/10 Pain location: bil knees Pain description: ache Aggravating factors: bending the knees too much Relieving factors: getting out of position     OBJECTIVE: (objective measures completed at initial evaluation unless otherwise dated)   DIAGNOSTIC FINDINGS:  None recently   PATIENT SURVEYS:  FOTO  38% FOTO 48% 05/15/22 FOTO 56% 05/29/22 FOTO 62% 06/07/2022   COGNITION:           Overall cognitive status: Within functional limits for tasks assessed                          SENSATION/NEURO: Light touch intact all extremities Resting tremors RUE, pt reports also intention tremor although no ataxia on finger<>chin testing (pt states present about 6 months and MD is aware) Increased difficulty with serial opposition RUE compared to L Unremarkable dysdiadochokinesia testing     POSTURE: rounded shoulders, forward head, and decreased lumbar lordosis      LOWER EXTREMITY MMT:     MMT Right eval Left eval  Hip flexion 5 5  Hip abduction (modified sitting) 5 5  Hip internal rotation      Hip external rotation      Knee flexion 5 5  Knee extension 4+ 4+   (Blank rows = not tested)  05/24/22: AROM : Right knee flexion 90, left knee flexion 103 Comments: grip strength mildly reduced on L compared to R but both intact       FUNCTIONAL TESTS:  5 times sit to stand: 16sec from standard chair no UE support:  Functional gait assessment: 15/30; use of assistive device (heavy reliance noted). Difficulty w/ narrow BOS walking, changes in gait speed, increase time for turning. Baseline gait impairments as below 05/11/2022 6 min walk test - 685 ft 06/07/2025 6 min walk test - 745 05/29/22: 5xSTS 11sec standard chair no UE support 05/29/22: Functional Gait Assessment: 19/30, unable to do narrow BOS, changes in gait speed, rail use reciprocal, mild veering with head turns to L, no AD use today    GAIT: Distance walked: within clinic Assistive device utilized: Single point cane Level of assistance: Modified independence Comments: somewhat widened BOS, reduced step length B, reduced truncal rotation, increased lateral weight shifting       TODAY'S TREATMENT:   [facility] Adult PT Treatment:                                                DATE: 06/07/2022 Therapeutic Exercise: Nu-step L5  x 5 min LE only Standing quad / hip flexor stretch 2 x 30 sec  6 min walk test  Reviewed and updated HEP today   [facility] Adult PT Treatment:                                                DATE: 06/06/2022 Therapeutic Exercise: Nu-Step L5 x 5 min LE only Standing hip flexor stretch 1 x  30 sec Neuromuscular re-ed: Reactive balance training with RTB for posterior resistance with 8 colored dots and starting with single leg 1 going controlled motion x 10, bil, then for speed with additional color called for unexpected direction training, x 10 bil, then mutliple color for speed x 20 bil Marching in place with RTB around waist 2 x 20 Standing on airx pad with multiangle pertubation' 2 x 20 in rhomberg postion 1 x 20 in modified tandem position.  Ball tosses while standing on airex pad 2 x 20 Swinging bat standing on airex pad 1 x 20    [facility] Adult PT Treatment:                                                DATE: 05/31/2022 Therapeutic Exercise: Treadmill walking up hill L 5 x.8 x 6 min Standing calf stretch 2 x 30 sec Dead lift standing on airex pad from a 8 inch step 3 x 10 - verbal cues to bend knees more  Therapeutic Activity: Walking up / down steps with airex on floor, 4 inch step with airex pad, and 8 inch step with airex pad 1 x 5 using bil UE on //, 1 x 5 with LUE in //, 1 x 5 with RUE in //, 1 x 5 with no UE assist requirng CGA for safety (mimicking walking up hill on unstable surface)     PATIENT EDUCATION: 06/07/2022 Education details: gradually working on endurance with increasing walking time starting at 6 mins (3 mins out and 3 min back) and over the course of 1-2 weeks increased 30 sec to 1 min and continue graduall progression every 2 weekds Person educated: Patient Education method: Explanation, Demonstration, Tactile cues, Verbal cues Education comprehension: verbalized understanding, returned demonstration, verbal cues required, tactile cues required, and needs further  education      HOME EXERCISE PROGRAM: Access Code: [email or web address] URL: [email or web address] Date: 06/07/2022 Prepared by: Starr Lake  Exercises - Sit to Stand with Armchair  - 1 x daily - 7 x weekly - 3 sets - 5 reps - Standing Hip Extension with Counter Support  - 1 x daily - 7 x weekly - 3 sets - 10 reps - standing 3 way with hands on back of chair  - 1 x daily - 7 x weekly - 3 sets - 10 reps - Standing Hip Abduction with Resistance at Ankles and Counter Support  - 1 x daily - 7 x weekly - 2 sets - 10 reps - Standing March with Counter Support  - 1 x daily - 7 x weekly - 2 sets - 10 reps - Tandem Walking with Counter Support  - 1 x daily - 7 x weekly - 2 sets - 10 reps   ASSESSMENT:   CLINICAL IMPRESSION: Mrs Palermo has done a great job with physical therapy increasing LE strength, improving her stability / balance and endurance. She improved her 6 min walk distance and has met or partially met all goals today. Reviewed and updated HEP as well as discussed walking program/ endurance progression. She is able to maintain and progress her current LOF IND and will be formally discharged from PT today.     OBJECTIVE IMPAIRMENTS: Abnormal gait, decreased activity tolerance, decreased balance, decreased coordination, decreased endurance, decreased mobility, difficulty walking, decreased strength, improper body mechanics, postural dysfunction, and obesity.  ACTIVITY LIMITATIONS: carrying, lifting, bending, standing, squatting, stairs, and transfers   PARTICIPATION LIMITATIONS: meal prep, cleaning, laundry, shopping, community activity, and yard work   PERSONAL FACTORS: Age and Fitness are also affecting patient's functional outcome.    REHAB POTENTIAL: Good   CLINICAL DECISION MAKING: Evolving/moderate complexity   EVALUATION COMPLEXITY: Moderate     GOALS: Goals reviewed with patient? No   SHORT TERM GOALS: Target date: 05/25/2022   Pt will demonstrate  appropriate understanding and performance of initially prescribed HEP in order to facilitate improved independence with management of symptoms.  Baseline: HEP provided on eval Goal status: met 05/22/2022   2. Pt will score greater than or equal to 44% on FOTO in order to demonstrate improved perception of function due to symptoms.            Baseline: 38% 05/15/22: 48%            Goal status: MET   LONG TERM GOALS: Target date: 06/22/2022   Pt will score 50% on FOTO in order to demonstrate improved perception of functional status due to symptoms.  Baseline: 38% 05/29/22: 56% Goal status: MET   2.  Pt will score greater than or equal to 22/30 on Functional Gait assessment in order to indicate reduced fall risk (cutoff score </= 22/30 predictive of falls per Ernestine Conrad et al 2010, MCID 4 pts Beninato et al 2014)            Baseline: 15/30  05/29/22: 19/30            Goal status: partially met 05/29/2022   4. Pt will perform 5xSTS in <13 sec in order to demonstrate reduced fall risk and improved functional independence. (MCID of 2.3sec)            Baseline: 16sec standard chair no UE support  05/29/22: 11 sec            Goal status: MET      PLAN: PT FREQUENCY: 2x/week   PT DURATION: 6 weeks   PLANNED INTERVENTIONS: Therapeutic exercises, Therapeutic activity, Neuromuscular re-education, Balance training, Gait training, Patient/Family education, Self Care, Stair training, Vestibular training, DME instructions, Aquatic Therapy, Manual therapy, and Re-evaluation.   PLAN FOR NEXT SESSION:  continue strengthening/balance exercises as able/appropriate  Starr Lake PT, DPT, LAT, ATC  06/07/22  11:29 AM            PHYSICAL THERAPY DISCHARGE SUMMARY  Visits from Start of Care: 13  Current functional level related to goals / functional outcomes: See goals, improvement in FOTO score to 62%   Remaining deficits: See assessment   Education / Equipment: HEP, theraband,  posture, lifting mechanics.   Patient agrees to discharge. Patient goals were met. Patient is being discharged due to being pleased with the current functional level.    Jovonta Levit PT, DPT, LAT, ATC  06/07/22  11:31 AM

## 2022-06-14 DIAGNOSIS — Z23 Encounter for immunization: Secondary | ICD-10-CM | POA: Diagnosis not present

## 2022-06-27 DIAGNOSIS — E119 Type 2 diabetes mellitus without complications: Secondary | ICD-10-CM | POA: Diagnosis not present

## 2022-06-27 DIAGNOSIS — I10 Essential (primary) hypertension: Secondary | ICD-10-CM | POA: Diagnosis not present

## 2022-06-27 DIAGNOSIS — M1712 Unilateral primary osteoarthritis, left knee: Secondary | ICD-10-CM | POA: Diagnosis not present

## 2022-06-29 DIAGNOSIS — E669 Obesity, unspecified: Secondary | ICD-10-CM | POA: Diagnosis not present

## 2022-06-29 DIAGNOSIS — I10 Essential (primary) hypertension: Secondary | ICD-10-CM | POA: Diagnosis not present

## 2022-06-29 DIAGNOSIS — G4730 Sleep apnea, unspecified: Secondary | ICD-10-CM | POA: Diagnosis not present

## 2022-06-29 DIAGNOSIS — E668 Other obesity: Secondary | ICD-10-CM | POA: Diagnosis not present

## 2022-06-29 DIAGNOSIS — R002 Palpitations: Secondary | ICD-10-CM | POA: Diagnosis not present

## 2022-06-29 DIAGNOSIS — I340 Nonrheumatic mitral (valve) insufficiency: Secondary | ICD-10-CM | POA: Diagnosis not present

## 2022-06-29 DIAGNOSIS — E782 Mixed hyperlipidemia: Secondary | ICD-10-CM | POA: Diagnosis not present

## 2022-06-29 DIAGNOSIS — I4891 Unspecified atrial fibrillation: Secondary | ICD-10-CM | POA: Diagnosis not present

## 2022-07-13 DIAGNOSIS — I340 Nonrheumatic mitral (valve) insufficiency: Secondary | ICD-10-CM | POA: Diagnosis not present

## 2022-07-25 DIAGNOSIS — I10 Essential (primary) hypertension: Secondary | ICD-10-CM | POA: Diagnosis not present

## 2022-07-25 DIAGNOSIS — E1129 Type 2 diabetes mellitus with other diabetic kidney complication: Secondary | ICD-10-CM | POA: Diagnosis not present

## 2022-07-25 DIAGNOSIS — G729 Myopathy, unspecified: Secondary | ICD-10-CM | POA: Diagnosis not present

## 2022-07-25 DIAGNOSIS — I2510 Atherosclerotic heart disease of native coronary artery without angina pectoris: Secondary | ICD-10-CM | POA: Diagnosis not present

## 2022-08-11 DIAGNOSIS — E668 Other obesity: Secondary | ICD-10-CM | POA: Diagnosis not present

## 2022-08-11 DIAGNOSIS — I340 Nonrheumatic mitral (valve) insufficiency: Secondary | ICD-10-CM | POA: Diagnosis not present

## 2022-08-11 DIAGNOSIS — G4730 Sleep apnea, unspecified: Secondary | ICD-10-CM | POA: Diagnosis not present

## 2022-08-11 DIAGNOSIS — R002 Palpitations: Secondary | ICD-10-CM | POA: Diagnosis not present

## 2022-08-11 DIAGNOSIS — I10 Essential (primary) hypertension: Secondary | ICD-10-CM | POA: Diagnosis not present

## 2022-08-11 DIAGNOSIS — E669 Obesity, unspecified: Secondary | ICD-10-CM | POA: Diagnosis not present

## 2022-08-11 DIAGNOSIS — E782 Mixed hyperlipidemia: Secondary | ICD-10-CM | POA: Diagnosis not present

## 2022-08-11 DIAGNOSIS — I4891 Unspecified atrial fibrillation: Secondary | ICD-10-CM | POA: Diagnosis not present

## 2022-09-14 DIAGNOSIS — N3281 Overactive bladder: Secondary | ICD-10-CM | POA: Diagnosis not present

## 2022-09-14 DIAGNOSIS — N3946 Mixed incontinence: Secondary | ICD-10-CM | POA: Diagnosis not present

## 2022-10-25 DIAGNOSIS — E785 Hyperlipidemia, unspecified: Secondary | ICD-10-CM | POA: Diagnosis not present

## 2022-10-25 DIAGNOSIS — E1129 Type 2 diabetes mellitus with other diabetic kidney complication: Secondary | ICD-10-CM | POA: Diagnosis not present

## 2022-10-25 DIAGNOSIS — R7989 Other specified abnormal findings of blood chemistry: Secondary | ICD-10-CM | POA: Diagnosis not present

## 2022-10-25 DIAGNOSIS — I10 Essential (primary) hypertension: Secondary | ICD-10-CM | POA: Diagnosis not present

## 2022-10-30 ENCOUNTER — Other Ambulatory Visit: Payer: Self-pay | Admitting: Cardiovascular Disease

## 2022-10-30 ENCOUNTER — Encounter: Payer: Self-pay | Admitting: Cardiovascular Disease

## 2022-10-30 DIAGNOSIS — I4891 Unspecified atrial fibrillation: Secondary | ICD-10-CM

## 2022-10-30 MED ORDER — DILTIAZEM HCL ER COATED BEADS 240 MG PO CP24: 240.0000 mg | ORAL_CAPSULE | Freq: Every day | ORAL | 0 refills | Status: DC

## 2022-10-31 ENCOUNTER — Other Ambulatory Visit: Payer: Self-pay | Admitting: Gastroenterology

## 2022-11-01 DIAGNOSIS — Z86010 Personal history of colonic polyps: Secondary | ICD-10-CM | POA: Diagnosis not present

## 2022-11-01 DIAGNOSIS — G729 Myopathy, unspecified: Secondary | ICD-10-CM | POA: Diagnosis not present

## 2022-11-01 DIAGNOSIS — R82998 Other abnormal findings in urine: Secondary | ICD-10-CM | POA: Diagnosis not present

## 2022-11-01 DIAGNOSIS — Z1339 Encounter for screening examination for other mental health and behavioral disorders: Secondary | ICD-10-CM | POA: Diagnosis not present

## 2022-11-01 DIAGNOSIS — I10 Essential (primary) hypertension: Secondary | ICD-10-CM | POA: Diagnosis not present

## 2022-11-01 DIAGNOSIS — E785 Hyperlipidemia, unspecified: Secondary | ICD-10-CM | POA: Diagnosis not present

## 2022-11-01 DIAGNOSIS — N3941 Urge incontinence: Secondary | ICD-10-CM | POA: Diagnosis not present

## 2022-11-01 DIAGNOSIS — I700 Atherosclerosis of aorta: Secondary | ICD-10-CM | POA: Diagnosis not present

## 2022-11-01 DIAGNOSIS — I480 Paroxysmal atrial fibrillation: Secondary | ICD-10-CM | POA: Diagnosis not present

## 2022-11-01 DIAGNOSIS — Z1331 Encounter for screening for depression: Secondary | ICD-10-CM | POA: Diagnosis not present

## 2022-11-01 DIAGNOSIS — Z0000 Encounter for general adult medical examination without abnormal findings: Secondary | ICD-10-CM | POA: Diagnosis not present

## 2022-11-01 DIAGNOSIS — E1149 Type 2 diabetes mellitus with other diabetic neurological complication: Secondary | ICD-10-CM | POA: Diagnosis not present

## 2022-11-08 DIAGNOSIS — M25561 Pain in right knee: Secondary | ICD-10-CM | POA: Diagnosis not present

## 2022-11-08 DIAGNOSIS — M170 Bilateral primary osteoarthritis of knee: Secondary | ICD-10-CM | POA: Diagnosis not present

## 2022-11-08 DIAGNOSIS — M25562 Pain in left knee: Secondary | ICD-10-CM | POA: Diagnosis not present

## 2022-11-08 DIAGNOSIS — R262 Difficulty in walking, not elsewhere classified: Secondary | ICD-10-CM | POA: Diagnosis not present

## 2022-11-13 ENCOUNTER — Ambulatory Visit
Admission: RE | Admit: 2022-11-13 | Discharge: 2022-11-13 | Disposition: A | Discharge status: Home / Self Care | Payer: Medicare Other | Source: Ambulatory Visit | Attending: Pulmonary Disease | Admitting: Pulmonary Disease

## 2022-11-13 DIAGNOSIS — I700 Atherosclerosis of aorta: Secondary | ICD-10-CM | POA: Diagnosis not present

## 2022-11-13 DIAGNOSIS — J449 Chronic obstructive pulmonary disease, unspecified: Secondary | ICD-10-CM

## 2022-11-13 DIAGNOSIS — R911 Solitary pulmonary nodule: Secondary | ICD-10-CM

## 2022-11-13 DIAGNOSIS — J439 Emphysema, unspecified: Secondary | ICD-10-CM | POA: Diagnosis not present

## 2022-11-15 DIAGNOSIS — M25562 Pain in left knee: Secondary | ICD-10-CM | POA: Diagnosis not present

## 2022-11-15 DIAGNOSIS — M25561 Pain in right knee: Secondary | ICD-10-CM | POA: Diagnosis not present

## 2022-11-15 DIAGNOSIS — R262 Difficulty in walking, not elsewhere classified: Secondary | ICD-10-CM | POA: Diagnosis not present

## 2022-11-15 DIAGNOSIS — M170 Bilateral primary osteoarthritis of knee: Secondary | ICD-10-CM | POA: Diagnosis not present

## 2022-11-21 ENCOUNTER — Encounter: Payer: Self-pay | Admitting: Pulmonary Disease

## 2022-11-21 ENCOUNTER — Ambulatory Visit (INDEPENDENT_AMBULATORY_CARE_PROVIDER_SITE_OTHER): Payer: Medicare Other | Admitting: Pulmonary Disease

## 2022-11-21 VITALS — BP 108/60 | HR 71 | Temp 97.3°F | Ht 60.0 in | Wt 222.0 lb

## 2022-11-21 DIAGNOSIS — J449 Chronic obstructive pulmonary disease, unspecified: Secondary | ICD-10-CM | POA: Diagnosis not present

## 2022-11-21 DIAGNOSIS — G4733 Obstructive sleep apnea (adult) (pediatric): Secondary | ICD-10-CM | POA: Diagnosis not present

## 2022-11-21 DIAGNOSIS — Z87891 Personal history of nicotine dependence: Secondary | ICD-10-CM | POA: Diagnosis not present

## 2022-11-21 DIAGNOSIS — R911 Solitary pulmonary nodule: Secondary | ICD-10-CM | POA: Diagnosis not present

## 2022-11-21 NOTE — Progress Notes (Signed)
Subjective:    Patient ID: Tammy [name], female    DOB: November 04, 1950, 72 y.o.   MRN: [id number] Patient Care Team: Cleatis Polka., MD as PCP - General (Internal Medicine) Rollene Rotunda, MD as PCP - Cardiology (Cardiology) Glory Buff, RN as Oncology Nurse Navigator Salena Saner, MD as Consulting Physician (Pulmonary Disease)  Chief Complaint  Patient presents with   Follow-up    SOB with exertion. Wheezing. No cough.   HPI Tammy [name] is a 72 year old former smoker (quit 2010, 30 PY) who presents for follow-up on the issue of a left lower lobe nodule noted on LDCT previously.  She was initially evaluated on 20 December 2021.  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  Patient has been asymptomatic with regards to the lung findings.  She has been having exertional dyspnea for a number of years.  She does not endorse any cough or sputum production.  No fevers, chills or sweats.  No hemoptysis.  She has had no chest pain, paroxysmal nocturnal dyspnea or orthopnea.  She notes that since she started Trelegy her dyspnea is markedly improved.  She had follow-up chest CT on 13 November 2022 and the subsolid nodule is persistent it is described as perhaps having a little bit of increased density.     Patient does not endorse any other symptomatology.  Overall she feels well and looks well.  DATA 10/26/2021 chest LDCT: Left lower lobe pulmonary nodule main diameter of 1.5 cm, poorly defined. 11/09/2021 PET/CT: No signs of hypermetabolic activity on the nodule in question cannot exclude indolent bronchogenic neoplasm, 63-month follow-up chest CT. 03/06/2022 chest CT: Persistent vague area of nodularity unchanged from prior, recommend follow-up CT 6 months. 04/20/2022 PFTs: FEV1 1.48 L or 77% predicted, FVC 2.32 L or 91% predicted, FEV1/FVC 64%, lung volumes normal with mild hyperinflation noted.  Bronchodilator response.   Diffusion capacity normal.  Consistent with moderate obstruction. 11/13/2022 chest CT: Persistent subsolid nodule 2.2 x 0.7 cm with 1.5 cm solid component.  Unchanged overall size, possible increased density.  Coronary calcifications noted.    Review of Systems A 10 point review of systems was performed and it is as noted above otherwise negative.  Patient Active Problem List   Diagnosis Date Noted   COPD suggested by initial evaluation (HCC) 04/20/2022   Nodule of lower lobe of left lung 11/11/2021   Fatigue 12/30/2020   Elevated coronary artery calcium score 12/30/2020   Unilateral primary osteoarthritis, left knee 10/06/2020   Respiratory failure, acute (HCC) 06/28/2020   Acute diastolic CHF (congestive heart failure) (HCC) 06/28/2020   Atrial fibrillation with rapid ventricular response (HCC) 06/28/2020   Acquired thrombophilia (HCC)    Depression    Atrial fibrillation with RVR (HCC) 06/16/2020   Diabetes mellitus (HCC)    HTN (hypertension)    Sleep apnea    Obesity, Class III, BMI 40-49.9 (morbid obesity) (HCC)    Chronic venous insufficiency 12/30/2018   Varicose veins of both lower extremities with inflammation 12/30/2018   DJD (degenerative joint disease) 12/30/2018   Snoring 11/27/2018   Daytime sleepiness 11/27/2018   Educated about COVID-19 virus infection 11/27/2018   SOB (shortness of breath) 11/27/2018   Hyperlipidemia 05/20/2015   Knee pain 06/06/2012   Social History   Tobacco Use   Smoking status: Former    Packs/day: 1.00    Years: 30.00    Additional pack years: 0.00  Total pack years: 30.00    Types: Cigarettes    Quit date: 11/01/2008    Years since quitting: 14.0   Smokeless tobacco: Never  Substance Use Topics   Alcohol use: No    Alcohol/week: 0.0 standard drinks of alcohol   Allergies  Allergen Reactions   Betadine [Povidone Iodine] Anaphylaxis   Contrast Media [Iodinated Contrast Media] Anaphylaxis   Iodine Anaphylaxis   Metrizamide  Anaphylaxis   Povidone-Iodine Anaphylaxis   Shellfish Allergy Anaphylaxis   Hydrocodone-Acetaminophen Nausea Only   Current Meds  Medication Sig   apixaban (ELIQUIS) 5 MG TABS tablet Take 1 tablet (5 mg total) by mouth 2 (two) times daily.   beta carotene w/minerals (OCUVITE) tablet Take 1 tablet by mouth daily.   diltiazem (CARDIZEM CD) 240 MG 24 hr capsule Take 1 capsule (240 mg total) by mouth daily.   docusate sodium (COLACE) 100 MG capsule Take 100 mg by mouth daily as needed for mild constipation.   DULoxetine (CYMBALTA) 60 MG capsule Take 60 mg by mouth daily.   EPINEPHrine 0.3 mg/0.3 mL IJ SOAJ injection Inject 0.3 mg into the muscle as needed for anaphylaxis.   Evolocumab (REPATHA SURECLICK) 140 MG/ML SOAJ Inject 140 mg into the skin every 14 (fourteen) days.   fesoterodine (TOVIAZ) 4 MG TB24 tablet Take 4 mg by mouth daily.   Fluticasone-Umeclidin-Vilant (TRELEGY ELLIPTA) 100-62.5-25 MCG/ACT AEPB Inhale 1 Dose into the lungs daily.   furosemide (LASIX) 20 MG tablet Take 1 tablet (20 mg total) by mouth 2 (two) times daily. (Patient taking differently: Take 20 mg by mouth daily.)   metFORMIN (GLUCOPHAGE) 1000 MG tablet Take 1,000 mg by mouth daily with breakfast.    sotalol (BETAPACE) 80 MG tablet Take 1 tablet (80 mg total) by mouth every 12 (twelve) hours.   tirzepatide [facility]) 2.5 MG/0.5ML Pen Inject 2.5 mg into the skin once a week.   valACYclovir (VALTREX) 1000 MG tablet Take 1,000 mg by mouth daily.   valsartan (DIOVAN) 320 MG tablet Take 320 mg by mouth daily.   Vibegron (GEMTESA) 75 MG TABS Take 1 tablet by mouth daily.   Immunization History  Administered Date(s) Administered   Influenza-Unspecified 03/21/2021   PFIZER Comirnaty(Gray Top)Covid-19 Tri-Sucrose Vaccine 10/03/2019, 10/31/2019       Objective:   Physical Exam BP 108/60 (BP Location: Left Arm, Cuff Size: Large)   Pulse 71   Temp (!) 97.3 F (36.3 C)   Ht 5' (1.524 m)   Wt 222 lb (100.7 kg)   SpO2  93%   BMI 43.36 kg/m   SpO2: 93 % O2 Device: None (Room air)  GENERAL: Morbidly obese woman, no acute distress, fully ambulatory.  No conversational dyspnea. HEAD: Normocephalic, atraumatic.  EYES: Pupils equal, round, reactive to light.  No scleral icterus.  MOUTH: Oral mucosa moist.  No thrush. NECK: Supple. No thyromegaly. Trachea midline. No JVD.  No adenopathy. PULMONARY: Good air entry bilaterally.  No adventitious sounds. CARDIOVASCULAR: S1 and S2. Regular rate and rhythm.  No rubs, murmurs or gallops heard.. ABDOMEN: Obese, otherwise benign. MUSCULOSKELETAL: No joint deformity, no clubbing, trace lower extremity edema.  NEUROLOGIC: Grossly nonfocal, gait slow.  Speech is fluent. SKIN: Intact,warm,dry.  Multiple varicosities lower extremities, mild stasis changes. PSYCH: Mood and behavior normal.   SPN malignancy risk assessment score ([facility]o): 20.6%   Representative image of PET/CT performed 13 November 2022 with nodule in question shown with arrow:     Assessment & Plan:     ICD-10-CM  1. Nodule of lower lobe of left lung  R91.1    Nodify Lung ([employer]) Will notify patient of results Further testing pending assay    2. Stage 2 moderate COPD by GOLD classification (HCC)  J44.9    Continue Trelegy Continue as needed albuterol    3. Former smoker  Z87.891    No evidence of relapse    4. OSA on CPAP  G47.33    Followed by Dr. Welton Flakes Patient states compliant with CPAP     I reviewed the patient's doing with her.  She understands the changes noted.  She had PET/CT in April 2023 that showed no activity whatsoever on this nodule.  Will continue to follow closely.  Will see the patient in follow-up in 8 to 8 weeks time she is to call sooner should any new problems arise.  Gailen Shelter, MD Advanced Bronchoscopy PCCM [facility]    *This note was dictated using voice recognition software/Dragon.  Despite best efforts to proofread, errors can  occur which can change the meaning. Any transcriptional errors that result from this process are unintentional and may not be fully corrected at the time of dictation.

## 2022-11-21 NOTE — Patient Instructions (Signed)
We are doing a Nodify Lung ([employer]) see is a blood test that will help [place] determine whether we need to worry more about the nodule or not.  Continue your Trelegy for now.  Continue your CPAP as per Dr. Welton Flakes.  Make sure you make an appointment with Dr. Welton Flakes with regards to your heart.  Follow-up in 6 to 8 weeks time call sooner should any new problems arise.

## 2022-11-22 DIAGNOSIS — M25562 Pain in left knee: Secondary | ICD-10-CM | POA: Diagnosis not present

## 2022-11-22 DIAGNOSIS — R262 Difficulty in walking, not elsewhere classified: Secondary | ICD-10-CM | POA: Diagnosis not present

## 2022-11-22 DIAGNOSIS — M170 Bilateral primary osteoarthritis of knee: Secondary | ICD-10-CM | POA: Diagnosis not present

## 2022-11-22 DIAGNOSIS — M25561 Pain in right knee: Secondary | ICD-10-CM | POA: Diagnosis not present

## 2022-11-23 DIAGNOSIS — R911 Solitary pulmonary nodule: Secondary | ICD-10-CM | POA: Diagnosis not present

## 2022-11-27 DIAGNOSIS — R911 Solitary pulmonary nodule: Secondary | ICD-10-CM | POA: Diagnosis not present

## 2022-11-29 DIAGNOSIS — M25562 Pain in left knee: Secondary | ICD-10-CM | POA: Diagnosis not present

## 2022-11-29 DIAGNOSIS — R262 Difficulty in walking, not elsewhere classified: Secondary | ICD-10-CM | POA: Diagnosis not present

## 2022-11-29 DIAGNOSIS — M25561 Pain in right knee: Secondary | ICD-10-CM | POA: Diagnosis not present

## 2022-11-29 DIAGNOSIS — M170 Bilateral primary osteoarthritis of knee: Secondary | ICD-10-CM | POA: Diagnosis not present

## 2022-12-05 ENCOUNTER — Telehealth: Payer: Self-pay

## 2022-12-05 ENCOUNTER — Encounter: Payer: Self-pay | Admitting: Pulmonary Disease

## 2022-12-05 ENCOUNTER — Ambulatory Visit (INDEPENDENT_AMBULATORY_CARE_PROVIDER_SITE_OTHER): Payer: Medicare Other | Admitting: Pulmonary Disease

## 2022-12-05 VITALS — BP 120/78 | HR 79 | Temp 97.9°F | Ht 60.0 in | Wt 221.0 lb

## 2022-12-05 DIAGNOSIS — J449 Chronic obstructive pulmonary disease, unspecified: Secondary | ICD-10-CM

## 2022-12-05 DIAGNOSIS — I480 Paroxysmal atrial fibrillation: Secondary | ICD-10-CM | POA: Diagnosis not present

## 2022-12-05 DIAGNOSIS — R911 Solitary pulmonary nodule: Secondary | ICD-10-CM | POA: Diagnosis not present

## 2022-12-05 DIAGNOSIS — G4733 Obstructive sleep apnea (adult) (pediatric): Secondary | ICD-10-CM

## 2022-12-05 NOTE — H&P (View-Only) (Signed)
 Subjective:    Patient ID: Tammy [name], female    DOB: 08/13/1950, 71 y.o.   MRN: [id number] Patient Care Team: Shaw, William D Jr., MD as PCP - General (Internal Medicine) Hochrein, James, MD as PCP - Cardiology (Cardiology) Rhode, Hayley, RN as Oncology Nurse Navigator Dayzee Trower L, MD as Consulting Physician (Pulmonary Disease)  Chief Complaint  Patient presents with   Follow-up    Nodify results. SOB with exertion. No wheezing or cough.    HPI Tammy [name] is a 71-year-old former smoker (quit 2010, 30 PY) who presents for follow-up on the issue of a left lower lobe nodule noted on LDCT previously.  She was initially evaluated on 20 December 2021.  She was last seen on 21 Nov 2022 and at that time was vies to continue on Trelegy Ellipta for stage II COPD.  A Nodify Lung ([employer]) assay was then collected and sent.  The results of the assay showed that the risk assessment for the nodule in question is 11%.  The patient presents today for discussion of next steps.  Patient continues to be asymptomatic with regards to the lung findings.  She has been having exertional dyspnea for a number of years, notes that Trelegy helps with this.  She does not endorse any cough or sputum production.  No fevers, chills or sweats.  No hemoptysis.  She has had no chest pain, paroxysmal nocturnal dyspnea or orthopnea.  She notes that since she started Trelegy her dyspnea is markedly improved.   She had follow-up chest CT on 13 November 2022 and the subsolid nodule is persistent it is described as perhaps having a little bit of increased density.  These findings coupled with the Nodify Lung ([employer]) assay findings were discussed with the patient and I recommend that we proceed to biopsy.  The procedure of choice in this situation would be robotic assisted navigational bronchoscopy.  The patient had opportunities to ask questions with regards to the procedure.  All of these were answered to her  satisfaction.  We discussed the potential complications of the procedure in layman's terms.  We discussed that the procedure would have to be done under general anesthesia. Complications are usually minor.  One potential complication would be collapse of the lung, if this happens she would have to have a small chest tube laced tube to relieve the collapse and patient would have to spend the night in the hospital in that event.  Other potential issues will include bleeding which are usually controlled at the time of the procedure.  The patient is on Eliquis and she understands that she would have to be off of Eliquis for 72 hours prior to the procedure, the patient has done this previously for back surgery without any untoward side effect.  Patient understands this and agrees to proceed.  DATA 10/26/2021 chest LDCT: Left lower lobe pulmonary nodule main diameter of 1.5 cm, poorly defined. 11/09/2021 PET/CT: No signs of hypermetabolic activity on the nodule in question cannot exclude indolent bronchogenic neoplasm, 3-month follow-up chest CT. 03/06/2022 chest CT: Persistent vague area of nodularity unchanged from prior, recommend follow-up CT 6 months. 04/20/2022 PFTs: FEV1 1.48 L or 77% predicted, FVC 2.32 L or 91% predicted, FEV1/FVC 64%, lung volumes normal with mild hyperinflation noted.  Bronchodilator response.  Diffusion capacity normal.  Consistent with moderate obstruction. 11/13/2022 chest CT: Persistent subsolid nodule 2.2 x 0.7 cm with 1.5 cm solid component.  Unchanged overall size, possible increased density.  Coronary calcifications noted.    Review of Systems A 10 point review of systems was performed and it is as noted above otherwise negative.  Patient Active Problem List   Diagnosis Date Noted   COPD suggested by initial evaluation (HCC) 04/20/2022   Nodule of lower lobe of left lung 11/11/2021   Fatigue 12/30/2020   Elevated coronary artery calcium score 12/30/2020   Unilateral  primary osteoarthritis, left knee 10/06/2020   Respiratory failure, acute (HCC) 06/28/2020   Acute diastolic CHF (congestive heart failure) (HCC) 06/28/2020   Atrial fibrillation with rapid ventricular response (HCC) 06/28/2020   Acquired thrombophilia (HCC)    Depression    Atrial fibrillation with RVR (HCC) 06/16/2020   Diabetes mellitus (HCC)    HTN (hypertension)    Sleep apnea    Obesity, Class III, BMI 40-49.9 (morbid obesity) (HCC)    Chronic venous insufficiency 12/30/2018   Varicose veins of both lower extremities with inflammation 12/30/2018   DJD (degenerative joint disease) 12/30/2018   Snoring 11/27/2018   Daytime sleepiness 11/27/2018   Educated about COVID-19 virus infection 11/27/2018   SOB (shortness of breath) 11/27/2018   Hyperlipidemia 05/20/2015   Knee pain 06/06/2012   Social History   Tobacco Use   Smoking status: Former    Packs/day: 1.00    Years: 30.00    Additional pack years: 0.00    Total pack years: 30.00    Types: Cigarettes    Quit date: 11/01/2008    Years since quitting: 14.1   Smokeless tobacco: Never  Substance Use Topics   Alcohol use: No    Alcohol/week: 0.0 standard drinks of alcohol   Allergies  Allergen Reactions   Betadine [Povidone Iodine] Anaphylaxis   Contrast Media [Iodinated Contrast Media] Anaphylaxis   Iodine Anaphylaxis   Metrizamide Anaphylaxis   Povidone-Iodine Anaphylaxis   Shellfish Allergy Anaphylaxis   Hydrocodone-Acetaminophen Nausea Only   Current Meds  Medication Sig   apixaban (ELIQUIS) 5 MG TABS tablet Take 1 tablet (5 mg total) by mouth 2 (two) times daily.   beta carotene w/minerals (OCUVITE) tablet Take 1 tablet by mouth daily.   diltiazem (CARDIZEM CD) 240 MG 24 hr capsule Take 1 capsule (240 mg total) by mouth daily.   docusate sodium (COLACE) 100 MG capsule Take 100 mg by mouth daily as needed for mild constipation.   DULoxetine (CYMBALTA) 60 MG capsule Take 60 mg by mouth daily.   EPINEPHrine 0.3  mg/0.3 mL IJ SOAJ injection Inject 0.3 mg into the muscle as needed for anaphylaxis.   Evolocumab (REPATHA SURECLICK) 140 MG/ML SOAJ Inject 140 mg into the skin every 14 (fourteen) days.   fesoterodine (TOVIAZ) 4 MG TB24 tablet Take 4 mg by mouth daily.   Fluticasone-Umeclidin-Vilant (TRELEGY ELLIPTA) 100-62.5-25 MCG/ACT AEPB Inhale 1 Dose into the lungs daily.   furosemide (LASIX) 20 MG tablet Take 1 tablet (20 mg total) by mouth 2 (two) times daily. (Patient taking differently: Take 20 mg by mouth daily.)   metFORMIN (GLUCOPHAGE) 1000 MG tablet Take 1,000 mg by mouth daily with breakfast.    sotalol (BETAPACE) 80 MG tablet Take 1 tablet (80 mg total) by mouth every 12 (twelve) hours.   tirzepatide ([facility]O) 2.5 MG/0.5ML Pen Inject 2.5 mg into the skin once a week.   valACYclovir (VALTREX) 1000 MG tablet Take 1,000 mg by mouth daily.   valsartan (DIOVAN) 320 MG tablet Take 320 mg by mouth daily.   Vibegron (GEMTESA) 75 MG TABS Take 1 tablet by mouth daily.   Immunization History  Administered   Date(s) Administered   Influenza-Unspecified 03/21/2021   PFIZER Comirnaty(Gray Top)Covid-19 Tri-Sucrose Vaccine 10/03/2019, 10/31/2019       Objective:   Physical Exam BP 120/78 (BP Location: Left Arm, Cuff Size: Large)   Pulse 79   Temp 97.9 F (36.6 C)   Ht 5' (1.524 m)   Wt 221 lb (100.2 kg)   SpO2 100%   BMI 43.16 kg/m   SpO2: 100 % O2 Device: None (Room air)  GENERAL: Morbidly obese woman, no acute distress, fully ambulatory.  No conversational dyspnea. HEAD: Normocephalic, atraumatic.  EYES: Pupils equal, round, reactive to light.  No scleral icterus.  MOUTH: No prosthesis, few chipped teeth.  Oral mucosa moist.  No thrush. NECK: Supple. No thyromegaly. Trachea midline. No JVD.  No adenopathy. PULMONARY: Good air entry bilaterally.  No adventitious sounds. CARDIOVASCULAR: S1 and S2. Regular rate and rhythm.  No rubs, murmurs or gallops heard.. ABDOMEN: Obese, otherwise  benign. MUSCULOSKELETAL: No joint deformity, no clubbing, trace lower extremity edema.  NEUROLOGIC: Grossly nonfocal, gait slow.  Speech is fluent. SKIN: Intact,warm,dry.  Multiple varicosities lower extremities, mild stasis changes. PSYCH: Mood and behavior normal.    SPN malignancy risk assessment score ([facility]o): 20.6%  Nodify Lung ([employer]): Risk assessment 11%  Representative images from the CT performed 13 November 2022 showing the nodule in question (arrows):         Assessment & Plan:     ICD-10-CM   1. Nodule of lower lobe of left lung  R91.1 CT SUPER D CHEST WO [facility] PILOT   Robotic assisted navigational bronchoscopy Procedure scheduled for 14 January 08, 2023 12:30 PM    2. Stage 2 moderate COPD by GOLD classification (HCC)  J44.9    Well compensated Continue Trelegy Continue as needed albuterol    3. Paroxysmal atrial fibrillation (HCC)  I48.0    On sotalol and Cardizem On Eliquis Will need to stop Eliquis 72 hours prior to procedure Follows with cardiology (Dr. Khan)    4. OSA on CPAP  G47.33    Compliant with CPAP Follows with Dr. Saadat Khan    5. Obesity, Class III, BMI 40-49.9 (morbid obesity) (HCC)  E66.01      Orders Placed This Encounter  Procedures   CT SUPER D CHEST WO [facility] PILOT    Standing Status:   Future    Standing Expiration Date:   12/05/2023    Order Specific Question:   Preferred imaging location?    Answer:   [facility]   Will see the patient in follow-up at her scheduled appointment time of 25 June.We will be in contact with her before during and after the procedure.  C. Laura Karyme Mcconathy, MD Advanced Bronchoscopy PCCM [facility]    *This note was dictated using voice recognition software/Dragon.  Despite best efforts to proofread, errors can occur which can change the meaning. Any transcriptional errors that result from this process are unintentional and may not be fully corrected at the time of  dictation.  

## 2022-12-05 NOTE — Progress Notes (Unsigned)
Subjective:    Patient ID: Tammy [name], female    DOB: October 30, 1950, 72 y.o.   MRN: [id number] Patient Care Team: Cleatis Polka., MD as PCP - General (Internal Medicine) Rollene Rotunda, MD as PCP - Cardiology (Cardiology) Glory Buff, RN as Oncology Nurse Navigator Salena Saner, MD as Consulting Physician (Pulmonary Disease)  Chief Complaint  Patient presents with   Follow-up    Nodify results. SOB with exertion. No wheezing or cough.    HPI Tammy [name] is a 72 year old former smoker (quit 2010, 30 PY) who presents for follow-up on the issue of a left lower lobe nodule noted on LDCT previously.  She was initially evaluated on 20 December 2021.  She was last seen on 21 Nov 2022 and at that time was vies to continue on Trelegy Ellipta for stage II COPD.  A Nodify Lung ([employer]) assay was then collected and sent.  The results of the assay showed that the risk assessment for the nodule in question is 11%.  The patient presents today for discussion of next steps.  Patient continues to be asymptomatic with regards to the lung findings.  She has been having exertional dyspnea for a number of years, notes that Trelegy helps with this.  She does not endorse any cough or sputum production.  No fevers, chills or sweats.  No hemoptysis.  She has had no chest pain, paroxysmal nocturnal dyspnea or orthopnea.  She notes that since she started Trelegy her dyspnea is markedly improved.   She had follow-up chest CT on 13 November 2022 and the subsolid nodule is persistent it is described as perhaps having a little bit of increased density.  These findings coupled with the Nodify Lung ([employer]) assay findings were discussed with the patient and I recommend that we proceed to biopsy.  The procedure of choice in this situation would be robotic assisted navigational bronchoscopy.  The patient had opportunities to ask questions with regards to the procedure.  All of these were answered to her  satisfaction.  We discussed the potential complications of the procedure in layman's terms.  We discussed that the procedure would have to be done under general anesthesia. Complications are usually minor.  One potential complication would be collapse of the lung, if this happens she would have to have a small chest tube laced tube to relieve the collapse and patient would have to spend the night in the hospital in that event.  Other potential issues will include bleeding which are usually controlled at the time of the procedure.  The patient is on Eliquis and she understands that she would have to be off of Eliquis for 72 hours prior to the procedure, the patient has done this previously for back surgery without any untoward side effect.  Patient understands this and agrees to proceed.  DATA 10/26/2021 chest LDCT: Left lower lobe pulmonary nodule main diameter of 1.5 cm, poorly defined. 11/09/2021 PET/CT: No signs of hypermetabolic activity on the nodule in question cannot exclude indolent bronchogenic neoplasm, 29-month follow-up chest CT. 03/06/2022 chest CT: Persistent vague area of nodularity unchanged from prior, recommend follow-up CT 6 months. 04/20/2022 PFTs: FEV1 1.48 L or 77% predicted, FVC 2.32 L or 91% predicted, FEV1/FVC 64%, lung volumes normal with mild hyperinflation noted.  Bronchodilator response.  Diffusion capacity normal.  Consistent with moderate obstruction. 11/13/2022 chest CT: Persistent subsolid nodule 2.2 x 0.7 cm with 1.5 cm solid component.  Unchanged overall size, possible increased density.  Coronary calcifications noted.  Review of Systems A 10 point review of systems was performed and it is as noted above otherwise negative.  Patient Active Problem List   Diagnosis Date Noted   COPD suggested by initial evaluation (HCC) 04/20/2022   Nodule of lower lobe of left lung 11/11/2021   Fatigue 12/30/2020   Elevated coronary artery calcium score 12/30/2020   Unilateral  primary osteoarthritis, left knee 10/06/2020   Respiratory failure, acute (HCC) 06/28/2020   Acute diastolic CHF (congestive heart failure) (HCC) 06/28/2020   Atrial fibrillation with rapid ventricular response (HCC) 06/28/2020   Acquired thrombophilia (HCC)    Depression    Atrial fibrillation with RVR (HCC) 06/16/2020   Diabetes mellitus (HCC)    HTN (hypertension)    Sleep apnea    Obesity, Class III, BMI 40-49.9 (morbid obesity) (HCC)    Chronic venous insufficiency 12/30/2018   Varicose veins of both lower extremities with inflammation 12/30/2018   DJD (degenerative joint disease) 12/30/2018   Snoring 11/27/2018   Daytime sleepiness 11/27/2018   Educated about COVID-19 virus infection 11/27/2018   SOB (shortness of breath) 11/27/2018   Hyperlipidemia 05/20/2015   Knee pain 06/06/2012   Social History   Tobacco Use   Smoking status: Former    Packs/day: 1.00    Years: 30.00    Additional pack years: 0.00    Total pack years: 30.00    Types: Cigarettes    Quit date: 11/01/2008    Years since quitting: 14.1   Smokeless tobacco: Never  Substance Use Topics   Alcohol use: No    Alcohol/week: 0.0 standard drinks of alcohol   Allergies  Allergen Reactions   Betadine [Povidone Iodine] Anaphylaxis   Contrast Media [Iodinated Contrast Media] Anaphylaxis   Iodine Anaphylaxis   Metrizamide Anaphylaxis   Povidone-Iodine Anaphylaxis   Shellfish Allergy Anaphylaxis   Hydrocodone-Acetaminophen Nausea Only   Current Meds  Medication Sig   apixaban (ELIQUIS) 5 MG TABS tablet Take 1 tablet (5 mg total) by mouth 2 (two) times daily.   beta carotene w/minerals (OCUVITE) tablet Take 1 tablet by mouth daily.   diltiazem (CARDIZEM CD) 240 MG 24 hr capsule Take 1 capsule (240 mg total) by mouth daily.   docusate sodium (COLACE) 100 MG capsule Take 100 mg by mouth daily as needed for mild constipation.   DULoxetine (CYMBALTA) 60 MG capsule Take 60 mg by mouth daily.   EPINEPHrine 0.3  mg/0.3 mL IJ SOAJ injection Inject 0.3 mg into the muscle as needed for anaphylaxis.   Evolocumab (REPATHA SURECLICK) 140 MG/ML SOAJ Inject 140 mg into the skin every 14 (fourteen) days.   fesoterodine (TOVIAZ) 4 MG TB24 tablet Take 4 mg by mouth daily.   Fluticasone-Umeclidin-Vilant (TRELEGY ELLIPTA) 100-62.5-25 MCG/ACT AEPB Inhale 1 Dose into the lungs daily.   furosemide (LASIX) 20 MG tablet Take 1 tablet (20 mg total) by mouth 2 (two) times daily. (Patient taking differently: Take 20 mg by mouth daily.)   metFORMIN (GLUCOPHAGE) 1000 MG tablet Take 1,000 mg by mouth daily with breakfast.    sotalol (BETAPACE) 80 MG tablet Take 1 tablet (80 mg total) by mouth every 12 (twelve) hours.   tirzepatide [facility]) 2.5 MG/0.5ML Pen Inject 2.5 mg into the skin once a week.   valACYclovir (VALTREX) 1000 MG tablet Take 1,000 mg by mouth daily.   valsartan (DIOVAN) 320 MG tablet Take 320 mg by mouth daily.   Vibegron (GEMTESA) 75 MG TABS Take 1 tablet by mouth daily.   Immunization History  Administered  Date(s) Administered   Influenza-Unspecified 03/21/2021   PFIZER Comirnaty(Gray Top)Covid-19 Tri-Sucrose Vaccine 10/03/2019, 10/31/2019       Objective:   Physical Exam BP 120/78 (BP Location: Left Arm, Cuff Size: Large)   Pulse 79   Temp 97.9 F (36.6 C)   Ht 5' (1.524 m)   Wt 221 lb (100.2 kg)   SpO2 100%   BMI 43.16 kg/m   SpO2: 100 % O2 Device: None (Room air)  GENERAL: Morbidly obese woman, no acute distress, fully ambulatory.  No conversational dyspnea. HEAD: Normocephalic, atraumatic.  EYES: Pupils equal, round, reactive to light.  No scleral icterus.  MOUTH: No prosthesis, few chipped teeth.  Oral mucosa moist.  No thrush. NECK: Supple. No thyromegaly. Trachea midline. No JVD.  No adenopathy. PULMONARY: Good air entry bilaterally.  No adventitious sounds. CARDIOVASCULAR: S1 and S2. Regular rate and rhythm.  No rubs, murmurs or gallops heard.. ABDOMEN: Obese, otherwise  benign. MUSCULOSKELETAL: No joint deformity, no clubbing, trace lower extremity edema.  NEUROLOGIC: Grossly nonfocal, gait slow.  Speech is fluent. SKIN: Intact,warm,dry.  Multiple varicosities lower extremities, mild stasis changes. PSYCH: Mood and behavior normal.    SPN malignancy risk assessment score ([facility]): 20.6%  Nodify Lung ([employer]): Risk assessment 11%  Representative images from the CT performed 13 November 2022 showing the nodule in question (arrows):         Assessment & Plan:     ICD-10-CM   1. Nodule of lower lobe of left lung  R91.1 CT SUPER D CHEST WO [facility] PILOT   Robotic assisted navigational bronchoscopy Procedure scheduled for 14 January 08, 2023 12:30 PM    2. Stage 2 moderate COPD by GOLD classification (HCC)  J44.9    Well compensated Continue Trelegy Continue as needed albuterol    3. Paroxysmal atrial fibrillation (HCC)  I48.0    On sotalol and Cardizem On Eliquis Will need to stop Eliquis 72 hours prior to procedure Follows with cardiology (Dr. Welton Flakes)    4. OSA on CPAP  G47.33    Compliant with CPAP Follows with Dr. Freda Munro    5. Obesity, Class III, BMI 40-49.9 (morbid obesity) (HCC)  E66.01      Orders Placed This Encounter  Procedures   CT SUPER D CHEST WO [facility] PILOT    Standing Status:   Future    Standing Expiration Date:   12/05/2023    Order Specific Question:   Preferred imaging location?    Answer:   [facility]   Will see the patient in follow-up at her scheduled appointment time of 25 June.We will be in contact with her before during and after the procedure.  Gailen Shelter, MD Advanced Bronchoscopy PCCM [facility]    *This note was dictated using voice recognition software/Dragon.  Despite best efforts to proofread, errors can occur which can change the meaning. Any transcriptional errors that result from this process are unintentional and may not be fully corrected at the time of  dictation.

## 2022-12-05 NOTE — Telephone Encounter (Signed)
Notify results reviewed by Dr. Jayme Cloud- The risk of malignancy in her nodule is 11%. We should proceed with biopsy. This is a very slow progress.  Dr. Jayme Cloud has an opening tomorrow at 9:30am to bring her in and go over the results and talk about her options.  ATC the patient. LVM for the patient to return my call.

## 2022-12-05 NOTE — Telephone Encounter (Signed)
I notified the patient and scheduled her an appt for today at 2:00pm.  Nothing further needed.

## 2022-12-05 NOTE — Patient Instructions (Addendum)
We have scheduled a biopsy to be done with the robot under general anesthesia for 29 December 2022.  You will be contacted by the preadmission area for instructions prior to the procedure.   You have a follow-up appointment with me on 25 June please keep that appointment that will be your postprocedure appointment.

## 2022-12-05 NOTE — Telephone Encounter (Signed)
Pre-Admit- 12/22/2022 8a-1p  I have notified the patient.

## 2022-12-05 NOTE — Telephone Encounter (Signed)
Robotic Bronchoscopy 12/29/2022 at 12:30pm Lung Nodule [id number]  Synetta Fail please see Bronch info.

## 2022-12-06 ENCOUNTER — Encounter: Payer: Self-pay | Admitting: Pulmonary Disease

## 2022-12-06 DIAGNOSIS — M170 Bilateral primary osteoarthritis of knee: Secondary | ICD-10-CM | POA: Diagnosis not present

## 2022-12-06 DIAGNOSIS — R262 Difficulty in walking, not elsewhere classified: Secondary | ICD-10-CM | POA: Diagnosis not present

## 2022-12-06 DIAGNOSIS — M25562 Pain in left knee: Secondary | ICD-10-CM | POA: Diagnosis not present

## 2022-12-06 DIAGNOSIS — M25561 Pain in right knee: Secondary | ICD-10-CM | POA: Diagnosis not present

## 2022-12-06 NOTE — Telephone Encounter (Signed)
Nothing further needed 

## 2022-12-06 NOTE — Telephone Encounter (Signed)
Patient has Medicare A & B and [employer] Prior Auth Not Required

## 2022-12-08 ENCOUNTER — Encounter: Payer: Self-pay | Admitting: Cardiovascular Disease

## 2022-12-08 ENCOUNTER — Ambulatory Visit (INDEPENDENT_AMBULATORY_CARE_PROVIDER_SITE_OTHER): Payer: Medicare Other | Admitting: Cardiovascular Disease

## 2022-12-08 VITALS — BP 110/60 | HR 87 | Ht 60.0 in | Wt 214.0 lb

## 2022-12-08 DIAGNOSIS — E782 Mixed hyperlipidemia: Secondary | ICD-10-CM | POA: Diagnosis not present

## 2022-12-08 DIAGNOSIS — I10 Essential (primary) hypertension: Secondary | ICD-10-CM

## 2022-12-08 DIAGNOSIS — G4730 Sleep apnea, unspecified: Secondary | ICD-10-CM

## 2022-12-08 DIAGNOSIS — R931 Abnormal findings on diagnostic imaging of heart and coronary circulation: Secondary | ICD-10-CM

## 2022-12-08 DIAGNOSIS — I4891 Unspecified atrial fibrillation: Secondary | ICD-10-CM

## 2022-12-08 DIAGNOSIS — R0789 Other chest pain: Secondary | ICD-10-CM

## 2022-12-08 DIAGNOSIS — R0602 Shortness of breath: Secondary | ICD-10-CM | POA: Diagnosis not present

## 2022-12-08 MED ORDER — ISOSORBIDE MONONITRATE ER 30 MG PO TB24
30.0000 mg | ORAL_TABLET | Freq: Every day | ORAL | 0 refills | Status: DC
Start: 2022-12-08 — End: 2022-12-21

## 2022-12-08 MED ORDER — DILTIAZEM HCL ER COATED BEADS 240 MG PO CP24
240.0000 mg | ORAL_CAPSULE | Freq: Every day | ORAL | 0 refills | Status: DC
Start: 2022-12-08 — End: 2023-03-05

## 2022-12-08 NOTE — Assessment & Plan Note (Signed)
On Repatha, continue.

## 2022-12-08 NOTE — Assessment & Plan Note (Signed)
Calcium score only [id number]

## 2022-12-08 NOTE — Assessment & Plan Note (Signed)
Chest tightness and shortness of breath. Will order stress test to check for blockages.

## 2022-12-08 NOTE — Assessment & Plan Note (Signed)
Patient in sinus rhythm today on EKG, HR 78 bpm. Continue diltiazem and sotalol, Eliquis.

## 2022-12-08 NOTE — Assessment & Plan Note (Signed)
Well controlled today. Continue same medications.  

## 2022-12-08 NOTE — Assessment & Plan Note (Signed)
Wearing and benefiting from CPAP. 

## 2022-12-08 NOTE — Assessment & Plan Note (Signed)
Shortness of breath when walking 50 feet.

## 2022-12-08 NOTE — Progress Notes (Signed)
Cardiology Office Note   Date:  12/08/2022   ID:  Tammy [name] 12-16-1950, MRN [id number]  PCP:  Cleatis Polka., MD  Cardiologist:  Adrian Blackwater, MD      History of Present Illness: Tammy [name] is a 72 y.o. female who presents for  Chief Complaint  Patient presents with   Follow-up    4 months follow up    Patient in office for routine cardiac exam.  Complains of chest tightness under left under arm. Gets short of breath walking 50 feet.      Past Medical History:  Diagnosis Date   Agatston coronary artery calcium score greater than 400    Arrhythmia    atrial fibrillation   CHF (congestive heart failure) (HCC)    Diabetes mellitus (HCC)    x 3 years   DJD (degenerative joint disease)    Encephalitis    Fibromyalgia    HTN (hypertension)    Hyperlipidemia    Sleep apnea      Past Surgical History:  Procedure Laterality Date   APPENDECTOMY     BREAST CYST EXCISION     BREAST EXCISIONAL BIOPSY Left 2004   COLONOSCOPY WITH PROPOFOL N/A 11/26/2020   Procedure: COLONOSCOPY WITH PROPOFOL;  Surgeon: Jeani Hawking, MD;  Location: [facility];  Service: Endoscopy;  Laterality: N/A;   HEMOSTASIS CLIP PLACEMENT  11/26/2020   Procedure: HEMOSTASIS CLIP PLACEMENT;  Surgeon: Jeani Hawking, MD;  Location: [facility];  Service: Endoscopy;;   KNEE ARTHROSCOPY     POLYPECTOMY  11/26/2020   Procedure: POLYPECTOMY;  Surgeon: Jeani Hawking, MD;  Location: [facility];  Service: Endoscopy;;   SUBMUCOSAL TATTOO INJECTION  11/26/2020   Procedure: SUBMUCOSAL TATTOO INJECTION;  Surgeon: Jeani Hawking, MD;  Location: [facility];  Service: Endoscopy;;   TEE WITHOUT CARDIOVERSION N/A 06/29/2020   Procedure: TRANSESOPHAGEAL ECHOCARDIOGRAM (TEE) with DCCV;  Surgeon: Laurier Nancy, MD;  Location: [facility];  Service: Cardiovascular;  Laterality: N/A;     Current Outpatient Medications  Medication Sig Dispense Refill   isosorbide mononitrate (IMDUR) 30  MG 24 hr tablet Take 1 tablet (30 mg total) by mouth daily. 30 tablet 0   apixaban (ELIQUIS) 5 MG TABS tablet Take 1 tablet (5 mg total) by mouth 2 (two) times daily. 60 tablet 0   beta carotene w/minerals (OCUVITE) tablet Take 1 tablet by mouth daily.     diltiazem (CARDIZEM CD) 240 MG 24 hr capsule Take 1 capsule (240 mg total) by mouth daily. 90 capsule 0   docusate sodium (COLACE) 100 MG capsule Take 100 mg by mouth daily as needed for mild constipation.     DULoxetine (CYMBALTA) 60 MG capsule Take 60 mg by mouth daily.     EPINEPHrine 0.3 mg/0.3 mL IJ SOAJ injection Inject 0.3 mg into the muscle as needed for anaphylaxis.     Evolocumab (REPATHA SURECLICK) 140 MG/ML SOAJ Inject 140 mg into the skin every 14 (fourteen) days.     fesoterodine (TOVIAZ) 4 MG TB24 tablet Take 4 mg by mouth daily.     Fluticasone-Umeclidin-Vilant (TRELEGY ELLIPTA) 100-62.5-25 MCG/ACT AEPB Inhale 1 Dose into the lungs daily. 180 each 1   furosemide (LASIX) 20 MG tablet Take 1 tablet (20 mg total) by mouth 2 (two) times daily. (Patient taking differently: Take 20 mg by mouth daily.) 30 tablet 1   metFORMIN (GLUCOPHAGE) 1000 MG tablet Take 1,000 mg by mouth daily with breakfast.      sotalol (  BETAPACE) 80 MG tablet Take 1 tablet (80 mg total) by mouth every 12 (twelve) hours. 60 tablet 0   tirzepatide (MOUNJARO) 2.5 MG/0.5ML Pen Inject 2.5 mg into the skin once a week.     valACYclovir (VALTREX) 1000 MG tablet Take 1,000 mg by mouth daily.     valsartan (DIOVAN) 320 MG tablet Take 320 mg by mouth daily.     Vibegron (GEMTESA) 75 MG TABS Take 1 tablet by mouth daily.     No current facility-administered medications for this visit.    Allergies:   Betadine [povidone iodine], Contrast media [iodinated contrast media], Iodine, Metrizamide, Povidone-iodine, Shellfish allergy, and Hydrocodone-acetaminophen    Social History:   reports that she quit smoking about 14 years ago. Her smoking use included cigarettes. She  has a 30.00 pack-year smoking history. She has never used smokeless tobacco. She reports that she does not drink alcohol and does not use drugs.   Family History:  family history includes Breast cancer in her cousin; CAD (age of onset: 30) in her mother; CAD (age of onset: 66) in her brother; Diabetes in her father; Heart disease in her father; Stomach cancer in her maternal grandfather; Throat cancer in her paternal uncle.    ROS:     Review of Systems  Constitutional: Negative.   HENT: Negative.    Eyes: Negative.   Respiratory:  Positive for shortness of breath.   Cardiovascular:  Positive for chest pain.  Gastrointestinal: Negative.   Genitourinary: Negative.   Musculoskeletal: Negative.   Skin: Negative.   Neurological: Negative.   Endo/Heme/Allergies: Negative.   Psychiatric/Behavioral: Negative.    All other systems reviewed and are negative.    All other systems are reviewed and negative.    PHYSICAL EXAM: VS:  BP 110/60   Pulse 87   Ht 5' (1.524 m)   Wt 214 lb (97.1 kg)   SpO2 92%   BMI 41.79 kg/m  , BMI Body mass index is 41.79 kg/m. Last weight:  Wt Readings from Last 3 Encounters:  12/08/22 214 lb (97.1 kg)  12/05/22 221 lb (100.2 kg)  11/21/22 222 lb (100.7 kg)   Physical Exam Constitutional:      Appearance: Normal appearance.  Cardiovascular:     Rate and Rhythm: Normal rate and regular rhythm.     Heart sounds: Normal heart sounds.  Pulmonary:     Effort: Pulmonary effort is normal.     Breath sounds: Normal breath sounds.  Musculoskeletal:     Right lower leg: No edema.     Left lower leg: No edema.  Neurological:     Mental Status: She is alert.     EKG: sinus rhythm, HR 78 bpm  Recent Labs: No results found for requested labs within last 365 days.    Lipid Panel    Component Value Date/Time   CHOL 143 06/17/2020 0450   CHOL 285 (H) 04/23/2015 1144   TRIG 50 06/17/2020 0450   TRIG 140 04/23/2015 1144   HDL 62 06/17/2020 0450    HDL 50 04/23/2015 1144   CHOLHDL 2.3 06/17/2020 0450   VLDL 10 06/17/2020 0450   LDLCALC 71 06/17/2020 0450   LDLCALC 207 (H) 04/23/2015 1144      Other studies Reviewed: Patient: [id number] - Tammy [name]. Tammy [name] DOB:  March 03, 1951  Date:  07/13/2022 10:30 Provider: Adrian Blackwater MD Encounter: ECHO   Page 2 REASON FOR VISIT  Visit for: Echocardiogram/I34.0  Sex:   Female  wt= 246  lbs.  BP=110/50  Height= 60   inches.   TESTS  Imaging: Echocardiogram:  An echocardiogram in (2-d) mode was performed and in Doppler mode with color flow velocity mapping was performed. The aortic valve cusps are abnormal 1.8   cm, flow velocity 1.97  m/s, and systolic calculated mean flow gradient 9   mmHg. Mitral valve diastolic peak flow velocity E .676   m/s and E/A ratio 0.8. Aortic root diameter 3.0 cm. The LVOT internal diameter 3.0 cm and flow velocity was abnormal 1.0   m/s. LV systolic dimension 2.75  cm, diastolic 4.09   cm, posterior wall thickness 1.13   cm, fractional shortening 32.8 %, and EF 61.7 %. IVS thickness 1.27  cm. LA dimension 4.8 cm. Mitral Valve has Mild Regurgitation. Tricuspid Valve has Trace Regurgitation.     ASSESSMENT  Technically difficult study due to body habitus.  Suboptimal study due to poor windows.  Normal chamber sizes.  Normal left ventricular systolic function.  Mild left ventricular hypertrophy with GRADE 1 (relaxation abnormality) diastolic dysfunction.  Normal right ventricular systolic function.  Normal right ventricular diastolic function.  Normal left ventricular wall motion.  Normal right ventricular wall motion.  Trace tricuspid regurgitation.  Normal pulmonary artery pressure.  Mild mitral regurgitation.  No pericardial effusion.  Mildly dilated Left atrium  Mild LVH.   THERAPY   Referring physician: Laurier Nancy  Sonographer: Adrian Blackwater.   Adrian Blackwater MD  Electronically signed by: Adrian Blackwater     Date: 07/13/2022  11:08  Patient: [id number] - Tammy [name]. Wach DOB:  1951/06/23  Date:  06/25/2020 12:58 Provider: Adrian Blackwater MD Encounter: [facility]   [facility] [address] [place], [place] [address] [phone] STUDY:  Gated Stress / Rest Myocardial Perfusion Imaging Tomographic (SPECT) Including attenuation correction Wall Motion, Left Ventricular Ejection Fraction By Gated Technique.Persantine Stress Test. SEX: Female   WEIGHT: 230 lbs   HEIGHT: 60 in            ARMS UP: YES/NO                                                                                                                                                                                REFERRING PHYSICIAN: Dr.Draycen Leichter [place] Flakes  INDICATION FOR STUDY: CP                                                                                                                                                                                                                    TECHNIQUE:  Approximately 20 minutes following the intravenous administration of 10.3 mCi of Tc-9m Sestamibi after stress testing in a reclined supine position with arms above their head if able to do so, gated SPECT imaging of the heart was performed. After about a 2hr break, the patient was injected intravenously with 31.6 mCi of Tc-44m Sestamibi.  Approximately 45 minutes later in the same position as stress imaging SPECT rest imaging of the heart was performed.  STRESS BY:  Adrian Blackwater, MD PROTOCOL:  Persantine   DOSE ADMIN: 10 cc  ROUTE OF ADMINISTRATION: IV                                                                            MAX PRED HR: 152                     85%: 129               75%: 114                                                                                                                    RESTING BP: 112/70   RESTING HR: 131  PEAK BP: 110/64   PEAK HR: 144  EXERCISE DURATION:    4 min injection                                            REASON FOR TEST TERMINATION:    Protocol end                                                                                                                              SYMPTOMS:   None                                                                                                                                                                                                          EKG RESULTS: Atrial fibrillation 125/min. No significant ST changes with persantine.                                                              IMAGE QUALITY: Good  PERFUSION/WALL MOTION FINDINGS: EF = 60%. No perfusion defects, normal wall motion.                                                                           IMPRESSION: Normal stress test with normal LVEF, except for atrial fibrillation rhythm.                                                                                                                                                                                                                                                                                          Adrian Blackwater, MD Stress Interpreting Physician / Nuclear Interpreting Physician  Adrian Blackwater MD  Electronically signed by: Adrian Blackwater     Date: 06/28/2020 14:01    ASSESSMENT AND PLAN:    ICD-10-CM   1. Other chest pain  R07.89  MYOCARDIAL PERFUSION IMAGING    isosorbide mononitrate (IMDUR) 30 MG 24 hr tablet    2. Atrial fibrillation with RVR (HCC)  I48.91 diltiazem (CARDIZEM CD) 240 MG 24 hr capsule    3. Primary hypertension  I10     4. Sleep apnea, unspecified type  G47.30     5. Mixed hyperlipidemia  E78.2     6. Elevated coronary artery calcium score  R93.1     7. SOB (shortness of breath)  R06.02 PCV ECHOCARDIOGRAM COMPLETE       Problem List Items Addressed This Visit       Cardiovascular and Mediastinum   Atrial fibrillation with RVR (HCC)    Patient in sinus rhythm today on EKG, HR 78 bpm. Continue diltiazem and sotalol, Eliquis.       Relevant Medications   diltiazem (CARDIZEM CD) 240 MG 24 hr capsule   isosorbide mononitrate (IMDUR) 30 MG 24 hr tablet   HTN (hypertension)    Well controlled today. Continue same medications.  Relevant Medications   diltiazem (CARDIZEM CD) 240 MG 24 hr capsule   isosorbide mononitrate (IMDUR) 30 MG 24 hr tablet   Elevated coronary artery calcium score    Calcium score only 2016 873      Relevant Medications   diltiazem (CARDIZEM CD) 240 MG 24 hr capsule   isosorbide mononitrate (IMDUR) 30 MG 24 hr tablet     Respiratory   Sleep apnea    Wearing and benefiting from CPAP.        Other   Hyperlipidemia    On Repatha, continue.       Relevant Medications   diltiazem (CARDIZEM CD) 240 MG 24 hr capsule   isosorbide mononitrate (IMDUR) 30 MG 24 hr tablet   SOB (shortness of breath)    Shortness of breath when walking 50 feet.       Relevant Orders   PCV ECHOCARDIOGRAM COMPLETE   Other chest pain - Primary    Chest tightness and shortness of breath. Will order stress test to check for blockages.       Relevant Medications   isosorbide mononitrate (IMDUR) 30 MG 24 hr tablet   Other Relevant Orders   MYOCARDIAL PERFUSION IMAGING     Disposition:   Return in about 4 weeks (around 01/05/2023) for after stress test, echo.    Total  time spent: 30 minutes  Signed,  Adrian Blackwater, MD  12/08/2022 11:08 AM    [facility]

## 2022-12-11 ENCOUNTER — Ambulatory Visit: Payer: Medicare Other | Admitting: Cardiovascular Disease

## 2022-12-13 ENCOUNTER — Encounter: Payer: Self-pay | Admitting: Cardiovascular Disease

## 2022-12-15 ENCOUNTER — Ambulatory Visit (INDEPENDENT_AMBULATORY_CARE_PROVIDER_SITE_OTHER): Payer: Medicare Other

## 2022-12-15 DIAGNOSIS — R0789 Other chest pain: Secondary | ICD-10-CM | POA: Diagnosis not present

## 2022-12-15 MED ORDER — TECHNETIUM TC 99M SESTAMIBI GENERIC - CARDIOLITE
10.2000 | Freq: Once | INTRAVENOUS | Status: AC | PRN
  Administered 2022-12-15: 10.2 via INTRAVENOUS

## 2022-12-15 MED ORDER — TECHNETIUM TC 99M SESTAMIBI GENERIC - CARDIOLITE
33.5000 | Freq: Once | INTRAVENOUS | Status: AC | PRN
Start: 2022-12-15 — End: 2022-12-15
  Administered 2022-12-15: 33.5 via INTRAVENOUS

## 2022-12-19 ENCOUNTER — Ambulatory Visit (INDEPENDENT_AMBULATORY_CARE_PROVIDER_SITE_OTHER): Payer: Medicare Other

## 2022-12-19 DIAGNOSIS — I361 Nonrheumatic tricuspid (valve) insufficiency: Secondary | ICD-10-CM | POA: Diagnosis not present

## 2022-12-19 DIAGNOSIS — R0602 Shortness of breath: Secondary | ICD-10-CM

## 2022-12-19 DIAGNOSIS — I340 Nonrheumatic mitral (valve) insufficiency: Secondary | ICD-10-CM | POA: Diagnosis not present

## 2022-12-21 ENCOUNTER — Encounter: Payer: Self-pay | Admitting: Pulmonary Disease

## 2022-12-21 ENCOUNTER — Encounter: Payer: Self-pay | Admitting: Cardiovascular Disease

## 2022-12-21 ENCOUNTER — Ambulatory Visit (INDEPENDENT_AMBULATORY_CARE_PROVIDER_SITE_OTHER): Payer: Medicare Other | Admitting: Cardiovascular Disease

## 2022-12-21 VITALS — BP 122/74 | HR 82 | Ht 60.0 in | Wt 215.6 lb

## 2022-12-21 DIAGNOSIS — I4891 Unspecified atrial fibrillation: Secondary | ICD-10-CM

## 2022-12-21 DIAGNOSIS — I10 Essential (primary) hypertension: Secondary | ICD-10-CM

## 2022-12-21 DIAGNOSIS — R002 Palpitations: Secondary | ICD-10-CM | POA: Diagnosis not present

## 2022-12-21 DIAGNOSIS — R0789 Other chest pain: Secondary | ICD-10-CM

## 2022-12-21 DIAGNOSIS — I5031 Acute diastolic (congestive) heart failure: Secondary | ICD-10-CM | POA: Diagnosis not present

## 2022-12-21 MED ORDER — ISOSORBIDE MONONITRATE ER 30 MG PO TB24
30.0000 mg | ORAL_TABLET | Freq: Every day | ORAL | 1 refills | Status: DC
Start: 2022-12-21 — End: 2023-04-20

## 2022-12-21 NOTE — Progress Notes (Signed)
Cardiology Office Note   Date:  12/21/2022   ID:  Lujane, Colquitt [birth year], MRN [id number]  PCP:  Cleatis Polka., MD  Cardiologist:  Adrian Blackwater, MD      History of Present Illness: Tammy [name] is a 72 y.o. female who presents for  Chief Complaint  Patient presents with   Follow-up    NST and Echo results    Palpitations  This is a recurrent problem. The current episode started more than 1 year ago. The problem has been unchanged. Nothing aggravates the symptoms.      Past Medical History:  Diagnosis Date   Agatston coronary artery calcium score greater than 400    Arrhythmia    atrial fibrillation   CHF (congestive heart failure) (HCC)    Diabetes mellitus (HCC)    x 3 years   DJD (degenerative joint disease)    Encephalitis    Fibromyalgia    HTN (hypertension)    Hyperlipidemia    Sleep apnea      Past Surgical History:  Procedure Laterality Date   APPENDECTOMY     BREAST CYST EXCISION     BREAST EXCISIONAL BIOPSY Left 2004   COLONOSCOPY WITH PROPOFOL N/A 11/26/2020   Procedure: COLONOSCOPY WITH PROPOFOL;  Surgeon: Jeani Hawking, MD;  Location: [facility];  Service: Endoscopy;  Laterality: N/A;   HEMOSTASIS CLIP PLACEMENT  11/26/2020   Procedure: HEMOSTASIS CLIP PLACEMENT;  Surgeon: Jeani Hawking, MD;  Location: [facility];  Service: Endoscopy;;   KNEE ARTHROSCOPY     POLYPECTOMY  11/26/2020   Procedure: POLYPECTOMY;  Surgeon: Jeani Hawking, MD;  Location: [facility];  Service: Endoscopy;;   SUBMUCOSAL TATTOO INJECTION  11/26/2020   Procedure: SUBMUCOSAL TATTOO INJECTION;  Surgeon: Jeani Hawking, MD;  Location: [facility];  Service: Endoscopy;;   TEE WITHOUT CARDIOVERSION N/A 06/29/2020   Procedure: TRANSESOPHAGEAL ECHOCARDIOGRAM (TEE) with DCCV;  Surgeon: Laurier Nancy, MD;  Location: [facility];  Service: Cardiovascular;  Laterality: N/A;     Current Outpatient Medications  Medication Sig Dispense Refill   apixaban  (ELIQUIS) 5 MG TABS tablet Take 1 tablet (5 mg total) by mouth 2 (two) times daily. 60 tablet 0   beta carotene w/minerals (OCUVITE) tablet Take 1 tablet by mouth daily.     diltiazem (CARDIZEM CD) 240 MG 24 hr capsule Take 1 capsule (240 mg total) by mouth daily. 90 capsule 0   docusate sodium (COLACE) 100 MG capsule Take 100 mg by mouth daily as needed for mild constipation.     DULoxetine (CYMBALTA) 60 MG capsule Take 60 mg by mouth daily.     EPINEPHrine 0.3 mg/0.3 mL IJ SOAJ injection Inject 0.3 mg into the muscle as needed for anaphylaxis.     Evolocumab (REPATHA SURECLICK) 140 MG/ML SOAJ Inject 140 mg into the skin every 14 (fourteen) days.     fesoterodine (TOVIAZ) 4 MG TB24 tablet Take 4 mg by mouth daily.     Fluticasone-Umeclidin-Vilant (TRELEGY ELLIPTA) 100-62.5-25 MCG/ACT AEPB Inhale 1 Dose into the lungs daily. 180 each 1   furosemide (LASIX) 20 MG tablet Take 1 tablet (20 mg total) by mouth 2 (two) times daily. (Patient taking differently: Take 20 mg by mouth daily.) 30 tablet 1   metFORMIN (GLUCOPHAGE) 1000 MG tablet Take 1,000 mg by mouth daily with breakfast.      sotalol (BETAPACE) 80 MG tablet Take 1 tablet (80 mg total) by mouth every 12 (twelve) hours. 60 tablet 0  tirzepatide [facility]) 2.5 MG/0.5ML Pen Inject 2.5 mg into the skin once a week.     valACYclovir (VALTREX) 1000 MG tablet Take 1,000 mg by mouth daily.     valsartan (DIOVAN) 320 MG tablet Take 320 mg by mouth daily.     Vibegron (GEMTESA) 75 MG TABS Take 1 tablet by mouth daily.     isosorbide mononitrate (IMDUR) 30 MG 24 hr tablet Take 1 tablet (30 mg total) by mouth daily. 90 tablet 1   No current facility-administered medications for this visit.    Allergies:   Betadine [povidone iodine], Contrast media [iodinated contrast media], Iodine, Metrizamide, Povidone-iodine, Shellfish allergy, and Hydrocodone-acetaminophen    Social History:   reports that she quit smoking about 14 years ago. Her smoking use  included cigarettes. She has a 30.00 pack-year smoking history. She has never used smokeless tobacco. She reports that she does not drink alcohol and does not use drugs.   Family History:  family history includes Breast cancer in her cousin; CAD (age of onset: 54) in her mother; CAD (age of onset: 78) in her brother; Diabetes in her father; Heart disease in her father; Stomach cancer in her maternal grandfather; Throat cancer in her paternal uncle.    ROS:     Review of Systems  Constitutional: Negative.   HENT: Negative.    Eyes: Negative.   Respiratory: Negative.    Cardiovascular:  Positive for palpitations.  Gastrointestinal: Negative.   Genitourinary: Negative.   Musculoskeletal: Negative.   Skin: Negative.   Neurological: Negative.   Endo/Heme/Allergies: Negative.   Psychiatric/Behavioral: Negative.    All other systems reviewed and are negative.     All other systems are reviewed and negative.    PHYSICAL EXAM: VS:  BP 122/74   Pulse 82   Ht 5' (1.524 m)   Wt 215 lb 9.6 oz (97.8 kg)   SpO2 98%   BMI 42.11 kg/m  , BMI Body mass index is 42.11 kg/m. Last weight:  Wt Readings from Last 3 Encounters:  12/21/22 215 lb 9.6 oz (97.8 kg)  12/08/22 214 lb (97.1 kg)  12/05/22 221 lb (100.2 kg)     Physical Exam Constitutional:      Appearance: Normal appearance.  Cardiovascular:     Rate and Rhythm: Normal rate and regular rhythm.     Heart sounds: Normal heart sounds.  Pulmonary:     Effort: Pulmonary effort is normal.     Breath sounds: Normal breath sounds.  Musculoskeletal:     Right lower leg: No edema.     Left lower leg: No edema.  Neurological:     Mental Status: She is alert.     EKG: none today  Recent Labs: No results found for requested labs within last 365 days.    Lipid Panel    Component Value Date/Time   CHOL 143 06/17/2020 0450   CHOL 285 (H) 04/23/2015 1144   TRIG 50 06/17/2020 0450   TRIG 140 04/23/2015 1144   HDL 62 06/17/2020  0450   HDL 50 04/23/2015 1144   CHOLHDL 2.3 06/17/2020 0450   VLDL 10 06/17/2020 0450   LDLCALC 71 06/17/2020 0450   LDLCALC 207 (H) 04/23/2015 1144      Other studies Reviewed: stress test, echo 12/2022   ASSESSMENT AND PLAN:    ICD-10-CM   1. Palpitation  R00.2    Has occasional palpitation, stress test mild apical reversible defect, low risk, 71%.    2. Acute diastolic  CHF (congestive heart failure) (HCC)  I50.31    EF normal, grade 1 diastolic dysfunction    3. Atrial fibrillation with RVR (HCC)  I48.91    continue sotolol    4. Primary hypertension  I10     5. Other chest pain  R07.89 isosorbide mononitrate (IMDUR) 30 MG 24 hr tablet       Problem List Items Addressed This Visit       Cardiovascular and Mediastinum   Atrial fibrillation with RVR (HCC)   Relevant Medications   isosorbide mononitrate (IMDUR) 30 MG 24 hr tablet   HTN (hypertension)   Relevant Medications   isosorbide mononitrate (IMDUR) 30 MG 24 hr tablet   Acute diastolic CHF (congestive heart failure) (HCC)   Relevant Medications   isosorbide mononitrate (IMDUR) 30 MG 24 hr tablet     Other   Other chest pain   Relevant Medications   isosorbide mononitrate (IMDUR) 30 MG 24 hr tablet   Other Visit Diagnoses     Palpitation    -  Primary   Has occasional palpitation, stress test mild apical reversible defect, low risk, 71%.        Disposition:   Return in about 3 months (around 03/23/2023).    Total time spent: 30 minutes  Signed,  Adrian Blackwater, MD  12/21/2022 9:44 AM    [facility]

## 2022-12-22 ENCOUNTER — Encounter
Admission: RE | Admit: 2022-12-22 | Discharge: 2022-12-22 | Disposition: A | Discharge status: Home / Self Care | Payer: Medicare Other | Source: Ambulatory Visit | Attending: Pulmonary Disease | Admitting: Pulmonary Disease

## 2022-12-22 ENCOUNTER — Other Ambulatory Visit: Payer: Self-pay

## 2022-12-22 DIAGNOSIS — I4891 Unspecified atrial fibrillation: Secondary | ICD-10-CM

## 2022-12-22 DIAGNOSIS — Z01812 Encounter for preprocedural laboratory examination: Secondary | ICD-10-CM

## 2022-12-22 HISTORY — DX: Other complications of anesthesia, initial encounter: T88.59XA

## 2022-12-22 HISTORY — DX: Chronic obstructive pulmonary disease, unspecified: J44.9

## 2022-12-22 HISTORY — DX: Cardiac arrhythmia, unspecified: I49.9

## 2022-12-22 HISTORY — DX: Personal history of urinary calculi: Z87.442

## 2022-12-22 HISTORY — DX: Nausea with vomiting, unspecified: R11.2

## 2022-12-22 HISTORY — DX: Other specified postprocedural states: Z98.890

## 2022-12-22 NOTE — Patient Instructions (Addendum)
Your procedure is scheduled on: 12/29/22 - Friday Report to the Registration Desk on the 1st floor of the [facility]. To find out your arrival time, please call [phone] between 1PM - 3PM on: 12/28/22 - Thursday If your arrival time is 6:00 am, do not arrive before that time as the [facility] entrance doors do not open until 6:00 am.  REMEMBER: Instructions that are not followed completely may result in serious medical risk, up to and including death; or upon the discretion of your surgeon and anesthesiologist your surgery may need to be rescheduled.  Do not eat food or drink any liquids after midnight the night before surgery.  No gum chewing or hard candies.   One week prior to surgery: Stop Anti-inflammatories (NSAIDS) such as Advil, Aleve, Ibuprofen, Motrin, Naproxen, Naprosyn and Aspirin based products such as Excedrin, Goody's Powder, BC Powder.  Stop ANY OVER THE COUNTER supplements until after surgery.  You may however, continue to take Tylenol if needed for pain up until the day of surgery.  Continue taking all prescribed medications with the exception of the following:  metFORMIN (GLUCOPHAGE) hold starting on 12/27/22. tirzepatide [facility]) hold 7 days prior to your procedure. apixaban (ELIQUIS) hold 3 days prior to your surgery.   TAKE ONLY THESE MEDICATIONS THE MORNING OF SURGERY WITH A SIP OF WATER:  TRELEGY ELLIPTA  isosorbide mononitrate (IMDUR)  Vibegron (GEMTESA)  diltiazem (CARDIZEM CD)  DULoxetine (CYMBALTA)  fesoterodine (TOVIAZ)  sotalol (BETAPACE)  valACYclovir (VALTREX)    No Alcohol for 24 hours before or after surgery.  No Smoking including e-cigarettes for 24 hours before surgery.  No chewable tobacco products for at least 6 hours before surgery.  No nicotine patches on the day of surgery.  Do not use any "recreational" drugs for at least a week (preferably 2 weeks) before your surgery.  Please be advised that the combination of  cocaine and anesthesia may have negative outcomes, up to and including death. If you test positive for cocaine, your surgery will be cancelled.  On the morning of surgery brush your teeth with toothpaste and water, you may rinse your mouth with mouthwash if you wish. Do not swallow any toothpaste or mouthwash.  Do not wear jewelry, make-up, hairpins, clips or nail polish.  Do not wear lotions, powders, or perfumes.   Do not shave body hair from the neck down 48 hours before surgery.  Contact lenses, hearing aids and dentures may not be worn into surgery.  Do not bring valuables to the hospital. [facility] is not responsible for any missing/lost belongings or valuables.   Bring your C-PAP to the hospital in case you may have to spend the night.   Notify your doctor if there is any change in your medical condition (cold, fever, infection).  Wear comfortable clothing (specific to your surgery type) to the hospital.  After surgery, you can help prevent lung complications by doing breathing exercises.  Take deep breaths and cough every 1-2 hours. Your doctor may order a device called an Incentive Spirometer to help you take deep breaths. When coughing or sneezing, hold a pillow firmly against your incision with both hands. This is called "splinting." Doing this helps protect your incision. It also decreases belly discomfort.  If you are being admitted to the hospital overnight, leave your suitcase in the car. After surgery it may be brought to your room.  In case of increased patient census, it may be necessary for you, the patient, to continue  your postoperative care in the Same Day Surgery department.  If you are being discharged the day of surgery, you will not be allowed to drive home. You will need a responsible individual to drive you home and stay with you for 24 hours after surgery.   If you are taking public transportation, you will need to have a responsible individual with  you.  Please call the Pre-admissions Testing Dept. at [phone] if you have any questions about these instructions.  Surgery Visitation Policy:  Patients having surgery or a procedure may have two visitors.  Children under the age of 72 must have an adult with them who is not the patient.  Inpatient Visitation:    Visiting hours are 7 a.m. to 8 p.m. Up to four visitors are allowed at one time in a patient room. The visitors may rotate out with other people during the day.  One visitor age 72 or older may stay with the patient overnight and must be in the room by 8 p.m.

## 2022-12-25 ENCOUNTER — Encounter
Admission: RE | Admit: 2022-12-25 | Discharge: 2022-12-25 | Disposition: A | Discharge status: Home / Self Care | Payer: Medicare Other | Source: Ambulatory Visit | Attending: Pulmonary Disease | Admitting: Pulmonary Disease

## 2022-12-25 ENCOUNTER — Ambulatory Visit
Admission: RE | Admit: 2022-12-25 | Discharge: 2022-12-25 | Disposition: A | Discharge status: Home / Self Care | Payer: Medicare Other | Source: Ambulatory Visit | Attending: Pulmonary Disease | Admitting: Pulmonary Disease

## 2022-12-25 DIAGNOSIS — R911 Solitary pulmonary nodule: Secondary | ICD-10-CM | POA: Insufficient documentation

## 2022-12-25 DIAGNOSIS — J432 Centrilobular emphysema: Secondary | ICD-10-CM | POA: Diagnosis not present

## 2022-12-25 DIAGNOSIS — I4891 Unspecified atrial fibrillation: Secondary | ICD-10-CM | POA: Insufficient documentation

## 2022-12-25 DIAGNOSIS — Z01812 Encounter for preprocedural laboratory examination: Secondary | ICD-10-CM

## 2022-12-25 LAB — BASIC METABOLIC PANEL
Anion gap: 9 (ref 5–15)
BUN: 15 mg/dL (ref 8–23)
CO2: 27 mmol/L (ref 22–32)
Calcium: 9.2 mg/dL (ref 8.9–10.3)
Chloride: 103 mmol/L (ref 98–111)
Creatinine, Ser: 0.73 mg/dL (ref 0.44–1.00)
GFR, Estimated: >60 mL/min (ref >60)
Glucose, Bld: 106 mg/dL — ABNORMAL HIGH (ref 70–99)
Potassium: 3.6 mmol/L (ref 3.5–5.1)
Sodium: 139 mmol/L (ref 135–145)

## 2022-12-25 LAB — CBC
HCT: 40.6 % (ref 36.0–46.0)
Hemoglobin: 12.9 g/dL (ref 12.0–15.0)
MCH: 29 pg (ref 26.0–34.0)
MCHC: 31.8 g/dL (ref 30.0–36.0)
MCV: 91.2 fL (ref 80.0–100.0)
Platelets: 292 10*3/uL (ref 150–400)
RBC: 4.45 MIL/uL (ref 3.87–5.11)
RDW: 14.1 % (ref 11.5–15.5)
WBC: 8.6 10*3/uL (ref 4.0–10.5)
nRBC: 0 % (ref 0.0–0.2)

## 2022-12-27 ENCOUNTER — Encounter: Payer: Self-pay | Admitting: Pulmonary Disease

## 2022-12-27 NOTE — Progress Notes (Signed)
Perioperative / Anesthesia Services  Pre-Admission Testing Clinical Review / Preoperative Anesthesia Consult  Date: 12/27/22  Patient Demographics:  Name: Tammy [name] DOB:   07-13-1951 MRN:   [id number]  Planned Surgical Procedure(s):    Case: [id number] Date/Time: 12/29/22 1200   Procedure: ROBOTIC ASSISTED NAVIGATIONAL BRONCHOSCOPY (Left)   Anesthesia type: General   Pre-op diagnosis: Lung Nodule   Location: [facility] PROCEDURE RM 02 / [facility] ORS FOR ANESTHESIA GROUP   Surgeons: Salena Saner, MD     NOTE: Available PAT nursing documentation and vital signs have been reviewed. Clinical nursing staff has updated patient's PMH/PSHx, current medication list, and drug allergies/intolerances to ensure comprehensive history available to assist in medical decision making as it pertains to the aforementioned surgical procedure and anticipated anesthetic course. Extensive review of available clinical information personally performed. Tammy [name] PMH and PSHx updated with any diagnoses/procedures that  may have been inadvertently omitted during her intake with the pre-admission testing department's nursing staff.  Clinical Discussion:  Tammy [name] is a 72 y.o. female who is submitted for pre-surgical anesthesia review and clearance prior to her undergoing the above procedure. Patient is a Former Smoker (30 pack years; quit 10/2008). Pertinent PMH includes: CAD, atrial fibrillation, CHF, aortic atherosclerosis, HTN, HLD, T2DM, OSAH (requires nocturnal PAP therapy), COPD, lung nodule, nephrolithiasis, DJD, fibromyalgia, OAB.   Patient is followed by cardiology Tammy Flakes, MD). She was last seen in the cardiology clinic on 12/21/2022; notes reviewed. At the time of her clinic visit, patient doing well overall from a cardiovascular perspective.  Patient was reporting a longstanding history of palpitations.  There were no provoking or palliating factors.  Episodes have been persistent for over  a year. Patient denied any chest pain, shortness of breath, PND, orthopnea, significant peripheral edema, weakness, fatigue, vertiginous symptoms, or presyncope/syncope. Patient with a past medical history significant for cardiovascular diagnoses. Documented physical exam was grossly benign, providing no evidence of acute exacerbation and/or decompensation of the patient's known cardiovascular conditions.  Coronary CTA was performed on 08/21/2014 revealing an elevated coronary calcium score of 873.  This was in the 99th percentile for age, sex, and race matched control.  Long-term cardiac event monitor study was performed on 12/04/2017 revealing predominant underlying sinus rhythm with periods of sinus tachycardia.  Multiform PVCs were noted.  1 atrial run was observed with the longest lasting 4 beats at a maximum rate of 175 bpm.  Rare atrial ectopy noted; isolated SVE's (<1%, 297), SVE couplets (<1%, 9).  Most recent myocardial perfusion imaging study was performed on 12/15/2022 revealing a normal left ventricular systolic function with a hyperdynamic LVEF 73%.  There were no regional wall motion abnormalities.  Patient able to exercise for a total of 3 minutes and achieved a workload of 4.2 METS.  Study was truncated due to fatigue.  Study revealed no evidence of stress-induced myocardial ischemia or arrhythmia; no scintigraphic evidence of scar.  Study determined to be normal and low risk overall.  Most recent TTE was performed on 12/19/2022 revealing a normal left ventricular systolic function with an EF of >55%.  There were no regional wall motion abnormalities.  Left ventricular diastolic Doppler parameters consistent with abnormal relaxation (G1DD). There was moderate left atrial enlargement.  Right ventricular size and function normal.  Trivial tricuspid and mitral valve regurgitation noted.  All transvalvular gradients were noted to be normal providing no evidence suggestive of valvular stenosis.   Aorta normal in size with no evidence of aneurysmal dilatation.  Patient  with an atrial fibrillation diagnosis; CHA2DS2-VASc Score = 5 (age, CHF, HTN, vascular disease history, T2DM). Her rate and rhythm are currently being maintained on oral diltiazem + sotalol. She is chronically anticoagulated using apixaban; reported to be compliant with therapy with no evidence or reports of GI bleeding.  Blood pressure well controlled at 122/74 mmHg on currently prescribed diuretic (furosemide), nitrate (isosorbide mononitrate), beta-blocker (sotalol) and ARB (valsartan) therapies. She is on a PCSK9i (evolocumab) for her HLD diagnosis and further ASCVD prevention. T2DM well-controlled on currently prescribed regimen per patient report; no A1c for review. Functional capacity, as defined by DASI, is documented as being >/= 4 METS.  No changes were made to her medication regimen.  Patient follow-up with outpatient cardiology in 3 months or sooner if needed.  Tammy [name] underwent CT imaging of the chest for lung screening on 10/26/2021, at which time an aggressive appearing LEFT lower lobe pulmonary nodule was appreciated; volume derived mean diameter 10.5 mm.  Subsequent PET scan demonstrated a spiculated nodule with a maximum SUV of 0.98.  Follow-up CT scan was recommended.  Repeat CT imaging was performed on 03/06/2022 demonstrating no significant change.  Indolent adenocarcinoma cannot be excluded.  CT scan was again repeated on 11/13/2022 revealing interval increase in size to 2.2 x 0.7 cm with a 1.5 cm solid component.  Patient was referred to pulmonary medicine Tammy [name]) to further discuss further evaluation of pulmonary nodule.  She has subsequently been scheduled for a ROBOTIC ASSISTED NAVIGATIONAL BRONCHOSCOPY (Left) on 12/29/2022 with Dr. Sarina Ser, MD.  Given patient's past medical history significant for cardiovascular diagnoses, presurgical cardiac clearance was sought by the PAT team. Per  cardiology, "this patient is optimized for surgery and may proceed with the planned procedural course with a LOW risk of significant perioperative cardiovascular complications".  Again, this patient is on standard dose DOAC therapy.  She has been instructed on recommendations for holding her apixaban for 3 days prior to her procedure with plans to restart as soon as postoperative bleeding risk felt to be minimized by her attending surgeon. The patient has been instructed that her last dose of her apixaban should be on 0 16 2024.  Patient reports previous perioperative complications with anesthesia in the past. Patient has a PMH (+) for PONV. Symptoms and history of PONV will be discussed with patient by anesthesia team on the day of her procedure. Interventions will be ordered as deemed necessary based on patient's individual care needs as determined by anesthesiologist. In review of the available records, it is noted that patient underwent a general anesthetic course at [facility] ([facility] in 01/2021 without documented complications.      12/21/2022    9:12 AM 12/08/2022    9:07 AM 12/05/2022    2:04 PM  Vitals with BMI  Height 5\' 0"  5\' 0"  5\' 0"   Weight 215 lbs 10 oz 214 lbs 221 lbs  BMI 42.11 41.79 43.16  Systolic 122 110 161  Diastolic 74 60 78  Pulse 82 87 79    Providers/Specialists:   NOTE: Primary physician provider listed below. Patient may have been seen by APP or partner within same practice.   PROVIDER ROLE / SPECIALTY LAST Barnie Del, MD Pulmonary Medicine (Surgeon) 12/05/2022  Cleatis Polka., MD Primary Care Provider 12/13/2022  Adrian Blackwater, MD Cardiology 12/21/2022  Louretta Shorten, MD Medical Oncology 11/11/2021   Allergies:  Betadine [povidone iodine], Contrast media [iodinated contrast media],  Iodine, Metrizamide, Povidone-iodine, Shellfish allergy, and Hydrocodone-acetaminophen  Current Home Medications:    No current facility-administered medications for this encounter.    apixaban (ELIQUIS) 5 MG TABS tablet   beta carotene w/minerals (OCUVITE) tablet   diltiazem (CARDIZEM CD) 240 MG 24 hr capsule   docusate sodium (COLACE) 100 MG capsule   DULoxetine (CYMBALTA) 60 MG capsule   EPINEPHrine 0.3 mg/0.3 mL IJ SOAJ injection   Evolocumab (REPATHA SURECLICK) 140 MG/ML SOAJ   fesoterodine (TOVIAZ) 4 MG TB24 tablet   Fluticasone-Umeclidin-Vilant (TRELEGY ELLIPTA) 100-62.5-25 MCG/ACT AEPB   furosemide (LASIX) 20 MG tablet   isosorbide mononitrate (IMDUR) 30 MG 24 hr tablet   metFORMIN (GLUCOPHAGE) 1000 MG tablet   sotalol (BETAPACE) 80 MG tablet   tirzepatide (MOUNJARO) 2.5 MG/0.5ML Pen   valACYclovir (VALTREX) 1000 MG tablet   valsartan (DIOVAN) 320 MG tablet   Vibegron (GEMTESA) 75 MG TABS   History:   Past Medical History:  Diagnosis Date   Aortic atherosclerosis (HCC)    Atrial fibrillation (HCC)    a.) CHA2DS2VASc = 5 (age, CHF, HTN, vascular disease history, T2DM);  b.) rate/rhythm maintained on oral diltiazem + sotolol; chronically anticoagulated with apixaban   CAD (coronary artery disease)    a.) cCTA 08/21/2014: Ca2+ score 873 (99th percentile for age/sex/race matched control)   CHF (congestive heart failure) (HCC)    a.) TTE 06/17/2020: EF 50-55%, LV dil, mild LVH, mild RVE, mod BAE, G1DD; b.) TTE 12/19/2022: EF >55%, mod LAE, triv TR/PR, G1DD   Complication of anesthesia    COPD (chronic obstructive pulmonary disease) (HCC)    DJD (degenerative joint disease)    Encephalitis    Fibromyalgia    History of kidney stones    HTN (hypertension)    Hyperlipidemia    Long term current use of anticoagulant    a.) apixaban   OAB (overactive bladder)    a.) on vibegron + fesoterodine   OSA on CPAP    PONV (postoperative nausea and vomiting)    T2DM (type 2 diabetes mellitus) (HCC)    Past Surgical History:  Procedure Laterality Date   APPENDECTOMY     BREAST CYST  EXCISION     BREAST EXCISIONAL BIOPSY Left 2004   COLONOSCOPY WITH PROPOFOL N/A 11/26/2020   Procedure: COLONOSCOPY WITH PROPOFOL;  Surgeon: Jeani Hawking, MD;  Location: [facility] END[facility]COPY;  Service: Endoscopy;  Laterality: N/A;   DIAGNOSTIC LAPAROSCOPY     HEMOSTASIS CLIP PLACEMENT  11/26/2020   Procedure: HEMOSTASIS CLIP PLACEMENT;  Surgeon: Jeani Hawking, MD;  Location: [facility] ENDOSCOPY;  Service: Endoscopy;;   KNEE ARTHROSCOPY     POLYPECTOMY  11/26/2020   Procedure: POLYPECTOMY;  Surgeon: Jeani Hawking, MD;  Location: [facility]COPY;  Service: Endoscopy;;   SUBMUCOSAL TATTOO INJECTION  11/26/2020   Procedure: SUBMUCOSAL TATTOO INJECTION;  Surgeon: Jeani Hawking, MD;  Location: [facility];  Service: Endoscopy;;   TEE WITHOUT CARDIOVERSION N/A 06/29/2020   Procedure: TRANSESOPHAGEAL ECHOCARDIOGRAM (TEE) with DCCV;  Surgeon: Laurier Nancy, MD;  Location: [facility] ORS;  Service: Cardiovascular;  Laterality: N/A;   Family History  Problem Relation Age of Onset   CAD Mother 29   Diabetes Father    Heart disease Father    CAD Brother 20   Throat cancer Paternal Uncle    Stomach cancer Maternal Grandfather    Breast cancer Cousin    Social History   Tobacco Use   Smoking status: Former    Packs/day: 1.00    Years: 30.00  Additional pack years: 0.00    Total pack years: 30.00    Types: Cigarettes    Quit date: 11/01/2008    Years since quitting: 14.1   Smokeless tobacco: Never  Vaping Use   Vaping Use: Never used  Substance Use Topics   Alcohol use: No    Alcohol/week: 0.0 standard drinks of alcohol   Drug use: No    Pertinent Clinical Results:  LABS:   Hospital Outpatient Visit on 12/25/2022  Component Date Value Ref Range Status   WBC 12/25/2022 8.6  4.0 - 10.5 K/uL Final   RBC 12/25/2022 4.45  3.87 - 5.11 MIL/uL Final   Hemoglobin 12/25/2022 12.9  12.0 - 15.0 g/dL Final   HCT 47/82/9562 40.6  36.0 - 46.0 % Final   MCV 12/25/2022 91.2  80.0 - 100.0 fL Final   MCH 12/25/2022  29.0  26.0 - 34.0 pg Final   MCHC 12/25/2022 31.8  30.0 - 36.0 g/dL Final   RDW 13/02/6577 14.1  11.5 - 15.5 % Final   Platelets 12/25/2022 292  150 - 400 K/uL Final   nRBC 12/25/2022 0.0  0.0 - 0.2 % Final   Performed at [facility], [address] [place], [place] [address]   Sodium 12/25/2022 139  135 - 145 mmol/L Final   Potassium 12/25/2022 3.6  3.5 - 5.1 mmol/L Final   Chloride 12/25/2022 103  98 - 111 mmol/L Final   CO2 12/25/2022 27  22 - 32 mmol/L Final   Glucose, Bld 12/25/2022 106 (H)  70 - 99 mg/dL Final   Glucose reference range applies only to samples taken after fasting for at least 8 hours.   BUN 12/25/2022 15  8 - 23 mg/dL Final   Creatinine, Ser 12/25/2022 0.73  0.44 - 1.00 mg/dL Final   Calcium 95/28/4132 9.2  8.9 - 10.3 mg/dL Final   GFR, Estimated 12/25/2022 >60  >60 mL/min Final   Comment: (NOTE) Calculated using the CKD-EPI Creatinine Equation (2021)    Anion gap 12/25/2022 9  5 - 15 Final   Performed at [facility], [address] [place], [place] [address]    ECG: Date: 12/08/2022 Time ECG obtained: 0842 AM Rate: 78 bpm Rhythm: normal sinus Axis (leads I and aVF): Normal Intervals: PR 130 ms. QRS 74 ms. QTc 478 ms. ST segment and T wave changes: No evidence of acute ST segment elevation or depression Comparison: Similar to previous tracing obtained on 12/31/2020   IMAGING / PROCEDURES: TRANSTHORACIC ECHOCARDIOGRAM performed on 12/19/2022 Normal left ventricular systolic function with an EF of >55% Technically adequate study.  Normal chamber sizes.  Mild left ventricular hypertrophy with GRADE 1(relaxation abnormality) diastolic dysfunction.  Normal right ventricular systolic function.  Normal right ventricular diastolic function.  Normal left ventricular wall motion.  Normal right ventricular wall motion.  Trace tricuspid regurgitation.  Normal pulmonary artery pressure.  Trace mitral regurgitation.  No pericardial  effusion.  Moderately dilated Left atrium  No LVH   MYOCARDIAL PERFUSION IMAGING STUDY (LEXISCAN) performed on 12/15/2022 Normal left ventricular systolic function with a hyperdynamic LVEF of 73% No evidence of stress-induced myocardial ischemia or arrhythmia; no scintigraphic evidence of scar Overall quality of the study is good Stress determined to be normal and low risk  PULMONARY FUNCTION TESTING performed on 04/20/2022    Latest Ref Rng & Units 04/20/2022    4:02 PM  PFT Results  FVC-Pre L 2.32   FVC-Predicted Pre % 91   FVC-Post L 2.22  FVC-Predicted Post % 88   Pre FEV1/FVC % % 64   Post FEV1/FCV % % 65   FEV1-Pre L 1.48   FEV1-Predicted Pre % 77   FEV1-Post L 1.43   DLCO uncorrected ml/min/mmHg 20.51   DLCO UNC% % 121   DLVA Predicted % 110   TLC L 5.16   TLC % Predicted % 115   RV % Predicted % 130    CT CARDIAC SCORING performed on 08/21/2014 Patient's total coronary calcium score is 873, which is 99th percentile for patient's of matched age, gender and race/ethnicity. Benign-appearing low-attenuation lesion intimately associated with the pericardium overlying the left ventricular apex, similar to remote prior examinations, presumably a small pericardial cyst.   Impression and Plan:  Tammy [name] has been referred for pre-anesthesia review and clearance prior to her undergoing the planned anesthetic and procedural courses. Available labs, pertinent testing, and imaging results were personally reviewed by me in preparation for upcoming operative/procedural course. [facility] medical record has been updated following extensive record review and patient interview with PAT staff.   This patient has been appropriately cleared by cardiology with an overall LOW risk of experiencing significant perioperative cardiovascular complications. Based on clinical review performed today (12/27/22), barring any significant acute changes in the patient's overall condition, it is  anticipated that she will be able to proceed with the planned surgical intervention. Any acute changes in clinical condition may necessitate her procedure being postponed and/or cancelled. Patient will meet with anesthesia team (MD and/or CRNA) on the day of her procedure for preoperative evaluation/assessment. Questions regarding anesthetic course will be fielded at that time.   Pre-surgical instructions were reviewed with the patient during her PAT appointment, and questions were fielded to satisfaction by PAT clinical staff. She has been instructed on which medications that she will need to hold prior to surgery, as well as the ones that have been deemed safe/appropriate to take on the day of her procedure. As part of the general education provided by PAT, patient made aware both verbally and in writing, that she would need to abstain from the use of any illegal substances during her perioperative course.  She was advised that failure to follow the provided instructions could necessitate case cancellation or result in serious perioperative complications up to and including death. Patient encouraged to contact PAT and/or her surgeon's office to discuss any questions or concerns that may arise prior to surgery; verbalized understanding.   Quentin Mulling, MSN, APRN, FNP-C, CEN [facility]  Peri-operative Services Nurse Practitioner Phone: [phone] Fax: [phone] 12/27/22 12:02 PM  NOTE: This note has been prepared using Dragon dictation software. Despite my best ability to proofread, there is always the potential that unintentional transcriptional errors may still occur from this process.

## 2022-12-29 ENCOUNTER — Encounter
Admission: RE | Disposition: A | Discharge status: Home / Self Care | Payer: Self-pay | Source: Home / Self Care | Attending: Pulmonary Disease

## 2022-12-29 ENCOUNTER — Encounter: Payer: Self-pay | Admitting: Pulmonary Disease

## 2022-12-29 ENCOUNTER — Other Ambulatory Visit: Payer: Self-pay

## 2022-12-29 ENCOUNTER — Ambulatory Visit: Payer: Medicare Other

## 2022-12-29 ENCOUNTER — Ambulatory Visit
Admission: RE | Admit: 2022-12-29 | Discharge: 2022-12-29 | Disposition: A | Discharge status: Home / Self Care | Payer: Medicare Other | Attending: Pulmonary Disease | Admitting: Pulmonary Disease

## 2022-12-29 ENCOUNTER — Ambulatory Visit: Payer: Medicare Other | Admitting: Urgent Care

## 2022-12-29 DIAGNOSIS — J449 Chronic obstructive pulmonary disease, unspecified: Secondary | ICD-10-CM | POA: Insufficient documentation

## 2022-12-29 DIAGNOSIS — I4891 Unspecified atrial fibrillation: Secondary | ICD-10-CM | POA: Diagnosis not present

## 2022-12-29 DIAGNOSIS — E119 Type 2 diabetes mellitus without complications: Secondary | ICD-10-CM | POA: Diagnosis not present

## 2022-12-29 DIAGNOSIS — I110 Hypertensive heart disease with heart failure: Secondary | ICD-10-CM | POA: Insufficient documentation

## 2022-12-29 DIAGNOSIS — Z87891 Personal history of nicotine dependence: Secondary | ICD-10-CM | POA: Insufficient documentation

## 2022-12-29 DIAGNOSIS — Z7901 Long term (current) use of anticoagulants: Secondary | ICD-10-CM | POA: Insufficient documentation

## 2022-12-29 DIAGNOSIS — N3281 Overactive bladder: Secondary | ICD-10-CM | POA: Insufficient documentation

## 2022-12-29 DIAGNOSIS — R911 Solitary pulmonary nodule: Secondary | ICD-10-CM | POA: Diagnosis not present

## 2022-12-29 DIAGNOSIS — C3432 Malignant neoplasm of lower lobe, left bronchus or lung: Secondary | ICD-10-CM | POA: Diagnosis not present

## 2022-12-29 DIAGNOSIS — M797 Fibromyalgia: Secondary | ICD-10-CM | POA: Diagnosis not present

## 2022-12-29 DIAGNOSIS — I2510 Atherosclerotic heart disease of native coronary artery without angina pectoris: Secondary | ICD-10-CM | POA: Insufficient documentation

## 2022-12-29 DIAGNOSIS — M1990 Unspecified osteoarthritis, unspecified site: Secondary | ICD-10-CM | POA: Diagnosis not present

## 2022-12-29 DIAGNOSIS — I700 Atherosclerosis of aorta: Secondary | ICD-10-CM | POA: Diagnosis not present

## 2022-12-29 DIAGNOSIS — Z7951 Long term (current) use of inhaled steroids: Secondary | ICD-10-CM | POA: Diagnosis not present

## 2022-12-29 DIAGNOSIS — I5031 Acute diastolic (congestive) heart failure: Secondary | ICD-10-CM | POA: Diagnosis not present

## 2022-12-29 DIAGNOSIS — E785 Hyperlipidemia, unspecified: Secondary | ICD-10-CM | POA: Diagnosis not present

## 2022-12-29 DIAGNOSIS — Z09 Encounter for follow-up examination after completed treatment for conditions other than malignant neoplasm: Secondary | ICD-10-CM | POA: Diagnosis not present

## 2022-12-29 DIAGNOSIS — G4733 Obstructive sleep apnea (adult) (pediatric): Secondary | ICD-10-CM | POA: Diagnosis not present

## 2022-12-29 DIAGNOSIS — Z01812 Encounter for preprocedural laboratory examination: Secondary | ICD-10-CM

## 2022-12-29 HISTORY — DX: Obstructive sleep apnea (adult) (pediatric): G47.33

## 2022-12-29 HISTORY — DX: Overactive bladder: N32.81

## 2022-12-29 HISTORY — DX: Atherosclerosis of aorta: I70.0

## 2022-12-29 HISTORY — DX: Long term (current) use of anticoagulants: Z79.01

## 2022-12-29 HISTORY — DX: Type 2 diabetes mellitus without complications: E11.9

## 2022-12-29 HISTORY — DX: Unspecified atrial fibrillation: I48.91

## 2022-12-29 HISTORY — DX: Atherosclerotic heart disease of native coronary artery without angina pectoris: I25.10

## 2022-12-29 LAB — GLUCOSE, CAPILLARY
Glucose-Capillary: 91 mg/dL (ref 70–99)
Glucose-Capillary: 100 mg/dL — ABNORMAL HIGH (ref 70–99)

## 2022-12-29 SURGERY — BRONCHOSCOPY, WITH BIOPSY USING ELECTROMAGNETIC NAVIGATION
Anesthesia: General | Laterality: Left

## 2022-12-29 MED ORDER — PHENYLEPHRINE HCL (PRESSORS) 10 MG/ML IV SOLN
INTRAVENOUS | Status: DC | PRN
  Administered 2022-12-29 (×2): 80 ug via INTRAVENOUS

## 2022-12-29 MED ORDER — EPHEDRINE SULFATE (PRESSORS) 50 MG/ML IJ SOLN
INTRAMUSCULAR | Status: DC | PRN
  Administered 2022-12-29 (×2): 7.5 mg via INTRAVENOUS
  Administered 2022-12-29: 10 mg via INTRAVENOUS

## 2022-12-29 MED ORDER — GLYCOPYRROLATE 0.2 MG/ML IJ SOLN
INTRAMUSCULAR | Status: DC | PRN
  Administered 2022-12-29: .2 mg via INTRAVENOUS

## 2022-12-29 MED ORDER — SODIUM CHLORIDE 0.9 % IV SOLN: Freq: Once | INTRAVENOUS | Status: AC

## 2022-12-29 MED ORDER — SUGAMMADEX SODIUM 200 MG/2ML IV SOLN
INTRAVENOUS | Status: DC | PRN
  Administered 2022-12-29: 200 mg via INTRAVENOUS

## 2022-12-29 MED ORDER — LIDOCAINE HCL (CARDIAC) PF 100 MG/5ML IV SOSY
PREFILLED_SYRINGE | INTRAVENOUS | Status: DC | PRN
  Administered 2022-12-29: 100 mg via INTRAVENOUS

## 2022-12-29 MED ORDER — SODIUM CHLORIDE 0.9 % IV SOLN: INTRAVENOUS | Status: DC | PRN

## 2022-12-29 MED ORDER — FAMOTIDINE 20 MG PO TABS
ORAL_TABLET | ORAL | Status: AC
  Filled 2022-12-29: qty 1

## 2022-12-29 MED ORDER — FAMOTIDINE 20 MG PO TABS
20.0000 mg | ORAL_TABLET | Freq: Once | ORAL | Status: AC
  Administered 2022-12-29: 20 mg via ORAL

## 2022-12-29 MED ORDER — DEXAMETHASONE SODIUM PHOSPHATE 10 MG/ML IJ SOLN
INTRAMUSCULAR | Status: AC
  Filled 2022-12-29: qty 1

## 2022-12-29 MED ORDER — FENTANYL CITRATE (PF) 100 MCG/2ML IJ SOLN: 25.0000 ug | INTRAMUSCULAR | Status: DC | PRN

## 2022-12-29 MED ORDER — DEXMEDETOMIDINE HCL IN NACL 200 MCG/50ML IV SOLN
INTRAVENOUS | Status: DC | PRN
  Administered 2022-12-29: 12 ug via INTRAVENOUS

## 2022-12-29 MED ORDER — IPRATROPIUM-ALBUTEROL 0.5-2.5 (3) MG/3ML IN SOLN
3.0000 mL | Freq: Once | RESPIRATORY_TRACT | Status: AC
  Administered 2022-12-29: 3 mL via RESPIRATORY_TRACT

## 2022-12-29 MED ORDER — PROPOFOL 10 MG/ML IV BOLUS
INTRAVENOUS | Status: AC
  Filled 2022-12-29: qty 20

## 2022-12-29 MED ORDER — DEXMEDETOMIDINE HCL IN NACL 80 MCG/20ML IV SOLN
INTRAVENOUS | Status: AC
  Filled 2022-12-29: qty 20

## 2022-12-29 MED ORDER — ROCURONIUM BROMIDE 100 MG/10ML IV SOLN
INTRAVENOUS | Status: DC | PRN
  Administered 2022-12-29: 50 mg via INTRAVENOUS

## 2022-12-29 MED ORDER — ROCURONIUM BROMIDE 10 MG/ML (PF) SYRINGE
PREFILLED_SYRINGE | INTRAVENOUS | Status: AC
  Filled 2022-12-29: qty 10

## 2022-12-29 MED ORDER — LACTATED RINGERS IV SOLN: INTRAVENOUS | Status: DC

## 2022-12-29 MED ORDER — FENTANYL CITRATE (PF) 100 MCG/2ML IJ SOLN
INTRAMUSCULAR | Status: DC | PRN
  Administered 2022-12-29 (×2): 25 ug via INTRAVENOUS
  Administered 2022-12-29: 50 ug via INTRAVENOUS

## 2022-12-29 MED ORDER — PROPOFOL 10 MG/ML IV BOLUS
INTRAVENOUS | Status: DC | PRN
  Administered 2022-12-29: 150 mg via INTRAVENOUS

## 2022-12-29 MED ORDER — ONDANSETRON HCL 4 MG/2ML IJ SOLN
INTRAMUSCULAR | Status: AC
  Filled 2022-12-29: qty 2

## 2022-12-29 MED ORDER — DEXAMETHASONE SODIUM PHOSPHATE 10 MG/ML IJ SOLN
INTRAMUSCULAR | Status: DC | PRN
  Administered 2022-12-29: 5 mg via INTRAVENOUS

## 2022-12-29 MED ORDER — IPRATROPIUM-ALBUTEROL 0.5-2.5 (3) MG/3ML IN SOLN
RESPIRATORY_TRACT | Status: AC
  Filled 2022-12-29: qty 3

## 2022-12-29 MED ORDER — ONDANSETRON HCL 4 MG/2ML IJ SOLN
INTRAMUSCULAR | Status: DC | PRN
  Administered 2022-12-29: 4 mg via INTRAVENOUS

## 2022-12-29 MED ORDER — FENTANYL CITRATE (PF) 100 MCG/2ML IJ SOLN
INTRAMUSCULAR | Status: AC
  Filled 2022-12-29: qty 2

## 2022-12-29 MED ORDER — EPHEDRINE 5 MG/ML INJ
INTRAVENOUS | Status: AC
  Filled 2022-12-29: qty 5

## 2022-12-29 MED ORDER — PHENYLEPHRINE 80 MCG/ML (10ML) SYRINGE FOR IV PUSH (FOR BLOOD PRESSURE SUPPORT)
PREFILLED_SYRINGE | INTRAVENOUS | Status: AC
  Filled 2022-12-29: qty 10

## 2022-12-29 MED ORDER — GLYCOPYRROLATE 0.2 MG/ML IJ SOLN
INTRAMUSCULAR | Status: AC
  Filled 2022-12-29: qty 1

## 2022-12-29 NOTE — Op Note (Signed)
PROCEDURES:  Robotic assisted bronchoscopy Cellvizio probe based confocal laser endomicroscopy (pCLE) Augmented fluoroscopy   Indication: Enlarging left lower lobe subsolid nodule measuring 2.2 x 0.7 cm with 1.5 cm solid component.  PET/CT without hypermetabolic activity (10/2021) continued growth since then.  Adenocarcinoma is a concern.  SPN risk 21% calculated, Nodify Lung (Biodesix) risk 11%.  Biopsy recommended.  Preoperative Diagnosis: Left lower lobe subsolid nodule 2.2 x 0.7 cm with 1.5 cm solid component, rule out adenocarcinoma Post Procedure Diagnosis: Same as above Consent: Verbal/Written: obtained  Benefits, limitations and potential complications of the procedure were discussed with the patient/family.  Complications from bronchoscopy are rare and most often minor, but if they occur they may include breathing difficulty, vocal cord spasm, hoarseness, slight fever, vomiting, dizziness, bronchospasm, infection, low blood oxygen, bleeding from biopsy site, or an allergic reaction to medications.  It is uncommon for patients to experience other more serious complications for example: Collapsed lung requiring chest tube placement, respiratory failure, heart attack and/or cardiac arrhythmia.  Patient understood the potential complications and agreed to proceed.  Surgeon: Gailen Shelter, MD Assistant/Scrub: Leonie Man, RRT Circulator: N/A Anesthesiologist/CRNA: Stephanie Coup, MD/Gijsbertus Darleene Cleaver, MD/Michael Causey CRNA Cytotechnology: [facility] Fluoroscopy technician: Yetta Flock, RT Representatives: Daphine Deutscher, Tim Lair ([employer]); Tawanna Cooler, [employer]  Type of Anesthesia: General endotracheal  Procedures Performed:   Robotic bronchoscopy: Procedure consists of robotic navigation comprised of electromagnetics, optical pattern recognition and robotic kinematic data - to triangulate bronchoscope location during the procedure and provide accurate positional data  to biopsy a lesion. Cellvizio probe based confocal laser endomicroscopy (pCLE) utilizing blue laser endomicroscopy. Augmented fluoroscopy with [employer].  Description of Procedure:  Robotic bronchoscopy: The patient was brought to Procedure Room 2 (Bronchoscopy Suite) in the OR area where appropriate timeout was taken with the staff after the patient was inducted under general anesthesia.  The patient was inducted under general anesthesia and intubated by the anesthesia team.  Patient was intubated with a 8.5 ET tube without difficulty.  Tube was secured at 4 cm above the carina.  A Portex adapter was placed on the ET tube flange.  Once the patient was under adequate general anesthesia the Olympus therapeutic video bronchoscope was advanced and an anatomic airway tour and surveillance bronchoscopy was performed.The distal trachea appeared unremarkable. The main carina was sharp.  No secretions were seen in either right or left mainstem bronchi. The RUL, RLL, RML appeared to be free of endobronchial masses, lesions, or purulent secretions. Likewise, the LLL/LUL appeared to be free of endobronchial masses, lesions, or purulent secretions.  Once the survey bronchoscopy was completed, registration for the augmented fluoroscopy ([employer]) was then performed with the fluoroscopic C arm.  Once this was completed, the robotic bronchoscope ET tube adapter was placed and ETT was cut to proper length and secured on the mid plane.  The [facility] robotic scope was then advanced through the ETT and registration was performed successfully.  There was good correlation between the robotic mapping and bronchoscopic mapping. With the assistance of fused navigation, the bronchoscope was advanced to the LLL nodule/mass. The tip of the working channel sat within 20 mm of the nodule  Positioning was confirmed with augmented fluoroscopy.  At this point Cellvizio probe based confocal laser endomicroscopy (pCLE) was utilized to  confirm target acquisition.  The images through Cellvizio were consistent with abnormal/lesional changes.   Augmented fluoroscopy via [employer] was utilized to optimize the position most favorable for biopsies, then the robotic  bronchoscope was anchored to maintain position.  A total of 8 transbronchial biopsies were obtained at this point, the lesion could be seen moving during biopsies.  ROSE on the first biopsy specimen showed atypical cells.  After confirmation of excellent hemostasis, for passes with a cytology brush on the LLL nodule/mass were performed, the brushes were cut into CytoLyt preservative.  ROSE was performed on the first and second brush passes, showing inflammatory and atypical cells.  A BAL was also obtained at this segment, which sent for cytology analysis.  For BAL sample acquisition purposes, 20 ml of normal saline were instilled, and approximately 4 ml were recovered/trapped and sent for analysis.   The robotic bronchoscope was then retracted all the way out after confirmation of excellent hemostasis.  At this point the procedure was terminated having completed all the sampling necessary.  The patient tolerated the procedure well. No significant bleeding was observed at the conclusion of the procedure.  At this point, the patient was allowed to emerge from general anesthesia, and was extubated in the procedure room without incident.  The patient  was taken to the PACU in satisfactory condition.  Auscultation of the lungs showed no change from pre bronchoscopy examination.  Patient tolerated the procedure well with no untoward effects of anesthesia noted.   Specimens Obtained:  Transbronchial Forceps Biopsy: X 8, LLL  Transbronchial Brush: X 4, LUL  Targeted BAL: 40 mL aliquot, submitted in CytoLyt  Fluoroscopy: Augmented fluoroscopy ([employer]) was utilized during the course of this procedure to assure that biopsies were taken in a safe manner under fluoroscopic guidance with  spot films required.  Total fluoroscopy time: 5 minutes 57 seconds, total dose 64.11 mGy.  Intraoperative images: Robotic bronchoscope at target   Fluoroscopic image of bronchoscope at target   Cellvizio images showing abnormal tissue consistent with target acquisition      Complications:None, no pneumothorax on post film:   Estimated Blood Loss: Nil    Assessment and Plan/Additional Comments: Left lower lobe subsolid nodule, rule out cancer Status post technically successful robotic assisted navigational bronchoscopy Await pathology reports Patient has appropriate pulmonary medicine follow-up Patient may resume Eliquis in the morning     C. Danice Goltz, MD Advanced Bronchoscopy PCCM [facility]    *This note was dictated using voice recognition software/Dragon.  Despite best efforts to proofread, errors can occur which can change the meaning.  Any change was purely unintentional.

## 2022-12-29 NOTE — Discharge Instructions (Addendum)
AMBULATORY SURGERY  DISCHARGE INSTRUCTIONS   The drugs that you were given will stay in your system until tomorrow so for the next 24 hours you should not:  Drive an automobile Make any legal decisions Drink any alcoholic beverage   You may resume regular meals tomorrow.  Today it is better to start with liquids and gradually work up to solid foods.  You may eat anything you prefer, but it is better to start with liquids, then soup and crackers, and gradually work up to solid foods.   Please notify your doctor immediately if you have any unusual bleeding, trouble breathing, redness and pain at the surgery site, drainage, fever, or pain not relieved by medication.    Additional Instructions: You may resume Eliquis in the morning.  Please contact your physician with any problems or Same Day Surgery at [phone], Monday through Friday 6 am to 4 pm, or [place] at [facility] number at [phone].

## 2022-12-29 NOTE — Anesthesia Postprocedure Evaluation (Signed)
Anesthesia Post Note  Patient: Tammy [name]  Procedure(s) Performed: ROBOTIC ASSISTED NAVIGATIONAL BRONCHOSCOPY (Left)  Patient location during evaluation: PACU Anesthesia Type: General Level of consciousness: awake and awake and alert Pain management: satisfactory to patient Vital Signs Assessment: post-procedure vital signs reviewed and stable Respiratory status: spontaneous breathing and nonlabored ventilation Cardiovascular status: stable Anesthetic complications: no   No notable events documented.   Last Vitals:  Vitals:   12/29/22 1425 12/29/22 1430  BP:  117/73  Pulse: 74   Resp: 13   Temp:    SpO2: 96%     Last Pain:  Vitals:   12/29/22 1430  TempSrc:   PainSc: 0-No pain                 VAN STAVEREN,Bawi Lakins

## 2022-12-29 NOTE — Transfer of Care (Signed)
Immediate Anesthesia Transfer of Care Note  Patient: Tammy [name]  Procedure(s) Performed: ROBOTIC ASSISTED NAVIGATIONAL BRONCHOSCOPY (Left)  Patient Location: PACU  Anesthesia Type:General  Level of Consciousness: drowsy and patient cooperative  Airway & Oxygen Therapy: Patient Spontanous Breathing and Patient connected to face mask oxygen  Post-op Assessment: Report given to RN and Post -op Vital signs reviewed and stable  Post vital signs: Reviewed and stable  Last Vitals: see EPIC Flowsheets Vitals Value Taken Time  BP    Temp    Pulse    Resp    SpO2      Last Pain:  Vitals:   12/29/22 1123  TempSrc: Temporal  PainSc: 0-No pain         Complications: No notable events documented.

## 2022-12-29 NOTE — Anesthesia Preprocedure Evaluation (Addendum)
Anesthesia Evaluation  Patient identified by MRN, date of birth, ID band Patient awake    Reviewed: Allergy & Precautions, NPO status , Patient's Chart, lab work & pertinent test results  History of Anesthesia Complications (+) PONV and history of anesthetic complications  Airway Mallampati: III  TM Distance: >3 FB Neck ROM: full    Dental  (+) Dental Advidsory Given   Pulmonary neg pulmonary ROS, shortness of breath and with exertion, sleep apnea , COPD,  COPD inhaler, former smoker   Pulmonary exam normal breath sounds clear to auscultation       Cardiovascular hypertension, + CAD and +CHF  negative cardio ROS Normal cardiovascular exam Rhythm:Regular Rate:Normal     Neuro/Psych  PSYCHIATRIC DISORDERS  Depression     Neuromuscular disease negative neurological ROS  negative psych ROS   GI/Hepatic negative GI ROS, Neg liver ROS,,,  Endo/Other  negative endocrine ROSdiabetes    Renal/GU negative Renal ROS  negative genitourinary   Musculoskeletal  (+) Arthritis ,  Fibromyalgia -  Abdominal   Peds  Hematology negative hematology ROS (+)   Anesthesia Other Findings Past Medical History: No date: Aortic atherosclerosis (HCC) No date: Atrial fibrillation (HCC)     Comment:  a.) CHA2DS2VASc = 5 (age, CHF, HTN, vascular disease               history, T2DM);  b.) rate/rhythm maintained on oral               diltiazem + sotolol; chronically anticoagulated with               apixaban No date: CAD (coronary artery disease)     Comment:  a.) cCTA 08/21/2014: Ca2+ score 873 (99th percentile for              age/sex/race matched control) No date: CHF (congestive heart failure) (HCC)     Comment:  a.) TTE 06/17/2020: EF 50-55%, LV dil, mild LVH, mild               RVE, mod BAE, G1DD; b.) TTE 12/19/2022: EF >55%, mod LAE,              triv TR/PR, G1DD No date: Complication of anesthesia No date: COPD (chronic obstructive  pulmonary disease) (HCC) No date: DJD (degenerative joint disease) No date: Encephalitis No date: Fibromyalgia No date: History of kidney stones No date: HTN (hypertension) No date: Hyperlipidemia No date: Long term current use of anticoagulant     Comment:  a.) apixaban No date: OAB (overactive bladder)     Comment:  a.) on vibegron + fesoterodine No date: OSA on CPAP No date: PONV (postoperative nausea and vomiting) No date: T2DM (type 2 diabetes mellitus) (HCC)  Past Surgical History: No date: APPENDECTOMY No date: BREAST CYST EXCISION 2004: BREAST EXCISIONAL BIOPSY; Left 11/26/2020: COLONOSCOPY WITH PROPOFOL; N/A     Comment:  Procedure: COLONOSCOPY WITH PROPOFOL;  Surgeon: Jeani Hawking, MD;  Location: [facility];  Service:               Endoscopy;  Laterality: N/A; No date: DIAGNOSTIC LAPAROSCOPY 11/26/2020: HEMOSTASIS CLIP PLACEMENT     Comment:  Procedure: HEMOSTASIS CLIP PLACEMENT;  Surgeon: Jeani Hawking, MD;  Location: [facility];  Service:  Endoscopy;; No date: KNEE ARTHROSCOPY 11/26/2020: POLYPECTOMY     Comment:  Procedure: POLYPECTOMY;  Surgeon: Jeani Hawking, MD;                Location: [facility];  Service: Endoscopy;; 11/26/2020: SUBMUCOSAL TATTOO INJECTION     Comment:  Procedure: SUBMUCOSAL TATTOO INJECTION;  Surgeon: Jeani Hawking, MD;  Location: [facility];  Service:               Endoscopy;; 06/29/2020: TEE WITHOUT CARDIOVERSION; N/A     Comment:  Procedure: TRANSESOPHAGEAL ECHOCARDIOGRAM (TEE) with               DCCV;  Surgeon: Laurier Nancy, MD;  Location: [facility];              Service: Cardiovascular;  Laterality: N/A;     Reproductive/Obstetrics negative OB ROS                             Anesthesia Physical Anesthesia Plan  ASA: 3  Anesthesia Plan: General ETT   Post-op Pain Management: Minimal or no pain anticipated   Induction:  Intravenous  PONV Risk Score and Plan: Propofol infusion  Airway Management Planned: Oral ETT  Additional Equipment:   Intra-op Plan:   Post-operative Plan: Extubation in OR  Informed Consent: I have reviewed the patients History and Physical, chart, labs and discussed the procedure including the risks, benefits and alternatives for the proposed anesthesia with the patient or authorized representative who has indicated his/her understanding and acceptance.     Dental Advisory Given  Plan Discussed with: Anesthesiologist, CRNA and Surgeon  Anesthesia Plan Comments: (Patient consented for risks of anesthesia including but not limited to:  - adverse reactions to medications - damage to eyes, teeth, lips or other oral mucosa - nerve damage due to positioning  - sore throat or hoarseness - Damage to heart, brain, nerves, lungs, other parts of body or loss of life  Patient voiced understanding.)        Anesthesia Quick Evaluation

## 2022-12-29 NOTE — Interval H&P Note (Signed)
Tammy [name] has presented today for surgery, with the diagnosis of LEFT lower lobe subsolid nodule.  The various methods of treatment have been discussed with the patient and family. After consideration of risks, benefits and other options for treatment, the patient has consented to  Procedure(s): ROBOTIC ASSISTED NAVIGATIONAL BRONCHOSCOPY-ATTENTION TO LEFT as a surgical intervention.  [name] history has been reviewed, patient examined, no change in status, stable for surgery.  I have reviewed the patient's chart and labs.  Questions were answered to the patient's satisfaction.  Benefits, limitations and potential complications of the procedure were discussed with the patient/family.  Complications from bronchoscopy are rare and most often minor, but if they occur they may include breathing difficulty, vocal cord spasm, hoarseness, slight fever, vomiting, dizziness, bronchospasm, infection, low blood oxygen, bleeding from biopsy site, or an allergic reaction to medications.  It is uncommon for patients to experience other more serious complications for example: Collapsed lung requiring chest tube placement, respiratory failure, heart attack and/or cardiac arrhythmia.  Patient agrees to proceed.  Gailen Shelter, MD Advanced Bronchoscopy PCCM [facility]    *This note was dictated using voice recognition software/Dragon.  Despite best efforts to proofread, errors can occur which can change the meaning. Any transcriptional errors that result from this process are unintentional and may not be fully corrected at the time of dictation.

## 2022-12-29 NOTE — Anesthesia Postprocedure Evaluation (Signed)
Anesthesia Post Note  Patient: Tammy [name]  Procedure(s) Performed: ROBOTIC ASSISTED NAVIGATIONAL BRONCHOSCOPY (Left)  Patient location during evaluation: PACU Anesthesia Type: General Level of consciousness: awake Pain management: pain level controlled Vital Signs Assessment: post-procedure vital signs reviewed and stable Respiratory status: spontaneous breathing Cardiovascular status: stable Anesthetic complications: no   No notable events documented.   Last Vitals:  Vitals:   12/29/22 1425 12/29/22 1430  BP:  117/73  Pulse: 74   Resp: 13   Temp:    SpO2: 96%     Last Pain:  Vitals:   12/29/22 1430  TempSrc:   PainSc: 0-No pain                 VAN STAVEREN,Tonni Mansour

## 2023-01-02 LAB — CYTOLOGY - NON PAP

## 2023-01-02 LAB — SURGICAL PATHOLOGY

## 2023-01-05 ENCOUNTER — Other Ambulatory Visit: Payer: Self-pay

## 2023-01-05 DIAGNOSIS — R911 Solitary pulmonary nodule: Secondary | ICD-10-CM

## 2023-01-09 ENCOUNTER — Ambulatory Visit (INDEPENDENT_AMBULATORY_CARE_PROVIDER_SITE_OTHER): Payer: Medicare Other | Admitting: Pulmonary Disease

## 2023-01-09 ENCOUNTER — Encounter: Payer: Self-pay | Admitting: Pulmonary Disease

## 2023-01-09 VITALS — BP 124/80 | HR 76 | Temp 97.6°F | Ht 60.0 in | Wt 215.0 lb

## 2023-01-09 DIAGNOSIS — R062 Wheezing: Secondary | ICD-10-CM

## 2023-01-09 DIAGNOSIS — G4733 Obstructive sleep apnea (adult) (pediatric): Secondary | ICD-10-CM | POA: Diagnosis not present

## 2023-01-09 DIAGNOSIS — R911 Solitary pulmonary nodule: Secondary | ICD-10-CM

## 2023-01-09 DIAGNOSIS — J449 Chronic obstructive pulmonary disease, unspecified: Secondary | ICD-10-CM | POA: Diagnosis not present

## 2023-01-09 LAB — NITRIC OXIDE: Nitric Oxide: 7

## 2023-01-09 MED ORDER — ALBUTEROL SULFATE HFA 108 (90 BASE) MCG/ACT IN AERS: 2.0000 | INHALATION_SPRAY | Freq: Four times a day (QID) | RESPIRATORY_TRACT | 2 refills | Status: AC | PRN

## 2023-01-09 NOTE — Patient Instructions (Signed)
We reiterated that the nodule findings were more consistent with inflammation.  However, we will continue to follow the nodule.  We will schedule breathing tests and follow-up around October.  Continue taking your Trelegy daily.  I have sent prescription to your pharmacy for an emergency inhaler called albuterol which you can use when you notice wheezing or shortness of breath.  You can use it up to 4 times a day if needed.

## 2023-01-09 NOTE — Progress Notes (Addendum)
Subjective:    Patient ID: Tammy [name], female    DOB: Nov 19, 1950, 72 y.o.   MRN: [id number]  Patient Care Team: Cleatis Polka., MD as PCP - General (Internal Medicine) Rollene Rotunda, MD as PCP - Cardiology (Cardiology) Glory Buff, RN as Oncology Nurse Navigator Salena Saner, MD as Consulting Physician (Pulmonary Disease)  Chief Complaint  Patient presents with   Follow-up    Nodule. Bronchoscopy on 12/29/2022. DOE. No wheezing or cough.    HPI Tammy [name] is a 72 year old former smoker who follows here for the issue of a lung nodule and stage II COPD.  She underwent robotic assisted navigational bronchoscopy on 29 December 2022 for a subsolid left lower lobe nodule that had been slowly enlarging.  This nodule has had little to no avidity on PET/CT previously.  Nodify Lung (Biodesix) as stated put the risk assessment at 11%.  Bronchoscopy specimen showed mostly inflammatory changes and few atypical cells favoring reactive change.  I discussed these findings with the patient in detail.  I recommend continued surveillance of this nodule.  With regards to her COPD she has been doing very well with the Trelegy Ellipta.  She notes wheezing in the evenings.  No cough.  She does have dyspnea on exertion that is at baseline.  No change in character.  DATA 10/26/2021 chest LDCT: Left lower lobe pulmonary nodule main diameter of 1.5 cm, poorly defined. 11/09/2021 PET/CT: No signs of hypermetabolic activity on the nodule in question cannot exclude indolent bronchogenic neoplasm, 18-month follow-up chest CT. 03/06/2022 chest CT: Persistent vague area of nodularity unchanged from prior, recommend follow-up CT 6 months. 04/20/2022 PFTs: FEV1 1.48 L or 77% predicted, FVC 2.32 L or 91% predicted, FEV1/FVC 64%, lung volumes normal with mild hyperinflation noted.  Bronchodilator response.  Diffusion capacity normal.  Consistent with moderate obstruction. 11/13/2022 chest CT: Persistent  subsolid nodule 2.2 x 0.7 cm with 1.5 cm solid component.  Unchanged overall size, possible increased density.  Coronary calcifications noted. 12/25/2022 chest CT: 2.0 cm part solid nodule in the anterior left lower lobe unchanged. 12/29/2022 bronchoscopy results: Rare atypical cells favoring reactive change, inflammatory changes, reactive bronchial cells.  Review of Systems A 10 point review of systems was performed and it is as noted above otherwise negative.   Patient Active Problem List   Diagnosis Date Noted   Other chest pain 12/08/2022   COPD suggested by initial evaluation (HCC) 04/20/2022   Nodule of lower lobe of left lung 11/11/2021   Fatigue 12/30/2020   Elevated coronary artery calcium score 12/30/2020   Unilateral primary osteoarthritis, left knee 10/06/2020   Respiratory failure, acute (HCC) 06/28/2020   Acute diastolic CHF (congestive heart failure) (HCC) 06/28/2020   Atrial fibrillation with rapid ventricular response (HCC) 06/28/2020   Acquired thrombophilia (HCC)    Depression    Atrial fibrillation with RVR (HCC) 06/16/2020   Diabetes mellitus (HCC)    HTN (hypertension)    Sleep apnea    Obesity, Class III, BMI 40-49.9 (morbid obesity) (HCC)    Chronic venous insufficiency 12/30/2018   Varicose veins of both lower extremities with inflammation 12/30/2018   DJD (degenerative joint disease) 12/30/2018   Snoring 11/27/2018   Daytime sleepiness 11/27/2018   Educated about COVID-19 virus infection 11/27/2018   SOB (shortness of breath) 11/27/2018   Hyperlipidemia 05/20/2015   Knee pain 06/06/2012    Social History   Tobacco Use   Smoking status: Former    Packs/day: 1.00  Years: 30.00    Additional pack years: 0.00    Total pack years: 30.00    Types: Cigarettes    Quit date: 11/01/2008    Years since quitting: 14.1   Smokeless tobacco: Never  Substance Use Topics   Alcohol use: No    Alcohol/week: 0.0 standard drinks of alcohol    Allergies   Allergen Reactions   Betadine [Povidone Iodine] Anaphylaxis   Contrast Media [Iodinated Contrast Media] Anaphylaxis   Iodine Anaphylaxis   Metrizamide Anaphylaxis   Povidone-Iodine Anaphylaxis   Shellfish Allergy Anaphylaxis   Hydrocodone-Acetaminophen Nausea Only    Current Meds  Medication Sig   apixaban (ELIQUIS) 5 MG TABS tablet Take 1 tablet (5 mg total) by mouth 2 (two) times daily.   beta carotene w/minerals (OCUVITE) tablet Take 1 tablet by mouth daily.   diltiazem (CARDIZEM CD) 240 MG 24 hr capsule Take 1 capsule (240 mg total) by mouth daily.   docusate sodium (COLACE) 100 MG capsule Take 100 mg by mouth daily as needed for mild constipation.   DULoxetine (CYMBALTA) 60 MG capsule Take 60 mg by mouth daily.   EPINEPHrine 0.3 mg/0.3 mL IJ SOAJ injection Inject 0.3 mg into the muscle as needed for anaphylaxis.   Evolocumab (REPATHA SURECLICK) 140 MG/ML SOAJ Inject 140 mg into the skin every 14 (fourteen) days.   fesoterodine (TOVIAZ) 4 MG TB24 tablet Take 4 mg by mouth daily.   Fluticasone-Umeclidin-Vilant (TRELEGY ELLIPTA) 100-62.5-25 MCG/ACT AEPB Inhale 1 Dose into the lungs daily.   furosemide (LASIX) 20 MG tablet Take 1 tablet (20 mg total) by mouth 2 (two) times daily. (Patient taking differently: Take 20 mg by mouth daily.)   isosorbide mononitrate (IMDUR) 30 MG 24 hr tablet Take 1 tablet (30 mg total) by mouth daily.   metFORMIN (GLUCOPHAGE) 1000 MG tablet Take 1,000 mg by mouth daily with breakfast.    sotalol (BETAPACE) 80 MG tablet Take 1 tablet (80 mg total) by mouth every 12 (twelve) hours.   tirzepatide [facility]) 2.5 MG/0.5ML Pen Inject 10.5 mg into the skin once a week. tuesday   valACYclovir (VALTREX) 1000 MG tablet Take 1,000 mg by mouth daily.   valsartan (DIOVAN) 320 MG tablet Take 320 mg by mouth daily.   Vibegron (GEMTESA) 75 MG TABS Take 1 tablet by mouth daily.    Immunization History  Administered Date(s) Administered   Influenza-Unspecified  03/21/2021   PFIZER Comirnaty(Gray Top)Covid-19 Tri-Sucrose Vaccine 10/03/2019, 10/31/2019   PFIZER(Purple Top)SARS-COV-2 Vaccination 05/23/2021        Objective:  BP 124/80 (BP Location: Left Arm, Cuff Size: Normal)   Pulse 76   Temp 97.6 F (36.4 C)   Ht 5' (1.524 m)   Wt 215 lb (97.5 kg)   SpO2 95%   BMI 41.99 kg/m   SpO2: 95 % O2 Device: None (Room air)  GENERAL: Morbidly obese woman, no acute distress, fully ambulatory.  No conversational dyspnea. HEAD: Normocephalic, atraumatic.  EYES: Pupils equal, round, reactive to light.  No scleral icterus.  MOUTH: No prosthesis, few chipped teeth.  Oral mucosa moist.  No thrush. NECK: Supple. No thyromegaly. Trachea midline. No JVD.  No adenopathy. PULMONARY: Good air entry bilaterally.  Expiratory wheezes noted throughout. CARDIOVASCULAR: S1 and S2. Regular rate and rhythm.  No rubs, murmurs or gallops heard.. ABDOMEN: Obese, otherwise benign. MUSCULOSKELETAL: No joint deformity, no clubbing, trace lower extremity edema.  NEUROLOGIC: Grossly nonfocal, gait slow.  Speech is fluent. SKIN: Intact,warm,dry.  Multiple varicosities lower extremities, mild stasis changes.  PSYCH: Mood and behavior normal.   Lab Results  Component Value Date   NITRICOXIDE 7 01/09/2023      Assessment & Plan:     ICD-10-CM   1. Stage 2 moderate COPD by GOLD classification [facility])  J44.9 Pulmonary Function Test [facility] Only   Continue Trelegy Add albuterol as needed Will need repeat PFTs around October 2024    2. Wheezing  R06.2 Nitric oxide   Probably triggered by mild environmental factors As needed albuterol    3. Nodule of lower lobe of left lung  R91.1    Biopsy consistent with inflammation Continue follow up Has follow-up CT ordered for December 2024    4. OSA on CPAP  G47.33    Patient follows with Dr.Khan for this issue States compliant with CPAP    5. Obesity, Class III, BMI 40-49.9 (morbid obesity) (HCC)  E66.01    Would benefit  from weight loss      Orders Placed This Encounter  Procedures   Pulmonary Function Test [facility] Only    Before follow up in October    Standing Status:   Future    Standing Expiration Date:   01/09/2024    Order Specific Question:   Full PFT: includes the following: basic spirometry, spirometry pre & post bronchodilator, diffusion capacity (DLCO), lung volumes    Answer:   Full PFT    Order Specific Question:   This test can only be performed at    Answer:   [facility]   Nitric oxide    Meds ordered this encounter  Medications   albuterol (VENTOLIN HFA) 108 (90 Base) MCG/ACT inhaler    Sig: Inhale 2 puffs into the lungs every 6 (six) hours as needed.    Dispense:  8 g    Refill:  2    Will see the patient in follow-up in/or around October 2024 with PFTs prior to that time.  She is to contact us prior to return visit should any new difficulties arise.  Gailen Shelter, MD Advanced Bronchoscopy PCCM [facility]    *This note was dictated using voice recognition software/Dragon.  Despite best efforts to proofread, errors can occur which can change the meaning. Any transcriptional errors that result from this process are unintentional and may not be fully corrected at the time of dictation.    Addendum 02/12/2023: Request for "clearance" for anesthesia for colonoscopy to be performed under propofol on 16 February 2023 has been requested.  The patient is compensated on Trelegy Ellipta and as needed albuterol.  She has stage II COPD.  Note that she underwent general anesthesia on 29 December 2022 for robotic assisted navigational bronchoscopy and did well after that procedure without any untoward side effect.  The patient does have a history of obstructive sleep apnea but he is compliant with CPAP.  However, I do not follow her for her sleep apnea issues as she follows with a different provider for this.  I do not anticipate that Ms. Winfrey would have any major issues  with colonoscopy under propofol.  She has been well compensated and has tolerated recent general anesthesia without major issues.  She is therefore considered a low risk.   Gailen Shelter, MD Advanced Bronchoscopy PCCM [facility]    *This note was dictated using voice recognition software/Dragon.  Despite best efforts to proofread, errors can occur which can change the meaning. Any transcriptional errors that result from this process are unintentional and may not be fully  corrected at the time of dictation.

## 2023-01-25 DIAGNOSIS — I2510 Atherosclerotic heart disease of native coronary artery without angina pectoris: Secondary | ICD-10-CM | POA: Diagnosis not present

## 2023-01-25 DIAGNOSIS — E1129 Type 2 diabetes mellitus with other diabetic kidney complication: Secondary | ICD-10-CM | POA: Diagnosis not present

## 2023-01-25 DIAGNOSIS — G720 Drug-induced myopathy: Secondary | ICD-10-CM | POA: Diagnosis not present

## 2023-01-25 DIAGNOSIS — I10 Essential (primary) hypertension: Secondary | ICD-10-CM | POA: Diagnosis not present

## 2023-02-08 ENCOUNTER — Encounter (HOSPITAL_COMMUNITY): Payer: Self-pay | Admitting: Gastroenterology

## 2023-02-08 NOTE — Progress Notes (Signed)
Tammy [name]  Prep instructions- reviewed  PCP-William Clelia Croft MD Cardiologist- Dr Welton Flakes  EKG- 12/13/22 Echo-12/19/22 Cath-n/a Stress- 12/15/22 ICD/PM-n/a Blood thinner- Eliquis 2 day hold GLP-1- Mounjaro last dose 7/23 hold 7 days  Hx: CHF,CAD,HTN,COPD,Afib,OSA,DM, plapitations. Sees pulmonology, last visit 6/25 with a follow up in october, recently had a bronch 6/14, saying feeling fine no SOB. Saw cardiology 5/24 where she was having palpiations. They ordered echo and stress test, per pt all tests were good and saw them again 6/6 where they discussed results and set f/u in 3 months.  Anesthesia Review: Yes

## 2023-02-09 ENCOUNTER — Telehealth: Payer: Self-pay

## 2023-02-09 NOTE — Telephone Encounter (Addendum)
Received Clearance request from [facility], [place]. Patient is scheduled for a colonoscopy propofol on 02/16/2023 to be preformed by Dr. Elnoria Howard. Patient's last office visit was 01/09/2023. Clearance for was placed in Dr. Georgann Housekeeper folder.  Dr. Jayme Cloud, will the patient need another office visit?

## 2023-02-12 NOTE — Telephone Encounter (Signed)
Clearance form and last office note has been faxed to [facility], [place].  Nothing further needed.

## 2023-02-12 NOTE — Telephone Encounter (Signed)
Colonoscopy is scheduled for Friday (8/2).

## 2023-02-14 DIAGNOSIS — M170 Bilateral primary osteoarthritis of knee: Secondary | ICD-10-CM | POA: Diagnosis not present

## 2023-02-15 NOTE — Anesthesia Preprocedure Evaluation (Addendum)
Anesthesia Evaluation  Patient identified by MRN, date of birth, ID band Patient awake    Reviewed: Allergy & Precautions, NPO status , Patient's Chart, lab work & pertinent test results, reviewed documented beta blocker date and time   History of Anesthesia Complications (+) PONV  Airway Mallampati: I  TM Distance: >3 FB Neck ROM: Full    Dental  (+) Teeth Intact, Dental Advisory Given   Pulmonary sleep apnea and Continuous Positive Airway Pressure Ventilation , COPD,  COPD inhaler, former smoker   breath sounds clear to auscultation       Cardiovascular hypertension, Pt. on medications and Pt. on home beta blockers (-) angina + dysrhythmias Atrial Fibrillation  Rhythm:Irregular Rate:Normal  12/2022 ECHO: normal LVF, mild LVH, normal RVF, no significant valvular abnormalities   Neuro/Psych    Depression    negative neurological ROS     GI/Hepatic negative GI ROS, Neg liver ROS,,,  Endo/Other  diabetes (glu 90), Oral Hypoglycemic Agents  Mounjaro: off 10d BMI 40  Renal/GU negative Renal ROS     Musculoskeletal  (+) Arthritis ,  Fibromyalgia -  Abdominal   Peds  Hematology Eliquis: off since Tuesday   Anesthesia Other Findings   Reproductive/Obstetrics                             Anesthesia Physical Anesthesia Plan  ASA: 3  Anesthesia Plan: MAC   Post-op Pain Management: Minimal or no pain anticipated   Induction:   PONV Risk Score and Plan: 2 and Ondansetron and Treatment may vary due to age or medical condition  Airway Management Planned: Natural Airway and Simple Face Mask  Additional Equipment: None  Intra-op Plan:   Post-operative Plan:   Informed Consent: I have reviewed the patients History and Physical, chart, labs and discussed the procedure including the risks, benefits and alternatives for the proposed anesthesia with the patient or authorized representative who has  indicated his/her understanding and acceptance.     Dental advisory given  Plan Discussed with: CRNA and Surgeon  Anesthesia Plan Comments:        Anesthesia Quick Evaluation

## 2023-02-16 ENCOUNTER — Encounter (HOSPITAL_COMMUNITY): Payer: Self-pay | Admitting: Gastroenterology

## 2023-02-16 ENCOUNTER — Other Ambulatory Visit: Payer: Self-pay

## 2023-02-16 ENCOUNTER — Ambulatory Visit (HOSPITAL_COMMUNITY)
Admission: RE | Admit: 2023-02-16 | Discharge: 2023-02-16 | Disposition: A | Discharge status: Home / Self Care | Payer: Medicare Other | Attending: Gastroenterology | Admitting: Gastroenterology

## 2023-02-16 ENCOUNTER — Encounter (HOSPITAL_COMMUNITY)
Admission: RE | Disposition: A | Discharge status: Home / Self Care | Payer: Self-pay | Source: Home / Self Care | Attending: Gastroenterology

## 2023-02-16 ENCOUNTER — Ambulatory Visit (HOSPITAL_COMMUNITY): Payer: Medicare Other | Admitting: Anesthesiology

## 2023-02-16 ENCOUNTER — Ambulatory Visit (HOSPITAL_BASED_OUTPATIENT_CLINIC_OR_DEPARTMENT_OTHER): Payer: Medicare Other | Admitting: Anesthesiology

## 2023-02-16 DIAGNOSIS — D175 Benign lipomatous neoplasm of intra-abdominal organs: Secondary | ICD-10-CM | POA: Insufficient documentation

## 2023-02-16 DIAGNOSIS — D120 Benign neoplasm of cecum: Secondary | ICD-10-CM | POA: Diagnosis not present

## 2023-02-16 DIAGNOSIS — Z86010 Personal history of colonic polyps: Secondary | ICD-10-CM | POA: Diagnosis not present

## 2023-02-16 DIAGNOSIS — K5790 Diverticulosis of intestine, part unspecified, without perforation or abscess without bleeding: Secondary | ICD-10-CM

## 2023-02-16 DIAGNOSIS — Z1211 Encounter for screening for malignant neoplasm of colon: Secondary | ICD-10-CM | POA: Insufficient documentation

## 2023-02-16 DIAGNOSIS — J449 Chronic obstructive pulmonary disease, unspecified: Secondary | ICD-10-CM

## 2023-02-16 DIAGNOSIS — D123 Benign neoplasm of transverse colon: Secondary | ICD-10-CM | POA: Diagnosis not present

## 2023-02-16 DIAGNOSIS — D126 Benign neoplasm of colon, unspecified: Secondary | ICD-10-CM | POA: Diagnosis not present

## 2023-02-16 DIAGNOSIS — K5909 Other constipation: Secondary | ICD-10-CM | POA: Insufficient documentation

## 2023-02-16 DIAGNOSIS — Z800 Family history of malignant neoplasm of digestive organs: Secondary | ICD-10-CM | POA: Insufficient documentation

## 2023-02-16 DIAGNOSIS — I4891 Unspecified atrial fibrillation: Secondary | ICD-10-CM | POA: Insufficient documentation

## 2023-02-16 DIAGNOSIS — I10 Essential (primary) hypertension: Secondary | ICD-10-CM | POA: Diagnosis not present

## 2023-02-16 DIAGNOSIS — K635 Polyp of colon: Secondary | ICD-10-CM | POA: Diagnosis not present

## 2023-02-16 DIAGNOSIS — D122 Benign neoplasm of ascending colon: Secondary | ICD-10-CM | POA: Insufficient documentation

## 2023-02-16 DIAGNOSIS — K6389 Other specified diseases of intestine: Secondary | ICD-10-CM | POA: Diagnosis not present

## 2023-02-16 DIAGNOSIS — I2510 Atherosclerotic heart disease of native coronary artery without angina pectoris: Secondary | ICD-10-CM | POA: Diagnosis not present

## 2023-02-16 DIAGNOSIS — Z7901 Long term (current) use of anticoagulants: Secondary | ICD-10-CM | POA: Insufficient documentation

## 2023-02-16 DIAGNOSIS — K5730 Diverticulosis of large intestine without perforation or abscess without bleeding: Secondary | ICD-10-CM | POA: Insufficient documentation

## 2023-02-16 DIAGNOSIS — Z87891 Personal history of nicotine dependence: Secondary | ICD-10-CM | POA: Diagnosis not present

## 2023-02-16 DIAGNOSIS — E785 Hyperlipidemia, unspecified: Secondary | ICD-10-CM | POA: Diagnosis not present

## 2023-02-16 HISTORY — PX: POLYPECTOMY: SHX5525

## 2023-02-16 HISTORY — PX: COLONOSCOPY WITH PROPOFOL: SHX5780

## 2023-02-16 LAB — GLUCOSE, CAPILLARY: Glucose-Capillary: 90 mg/dL (ref 70–99)

## 2023-02-16 SURGERY — COLONOSCOPY WITH PROPOFOL
Anesthesia: Monitor Anesthesia Care

## 2023-02-16 MED ORDER — ONDANSETRON HCL 4 MG/2ML IJ SOLN
INTRAMUSCULAR | Status: DC | PRN
  Administered 2023-02-16: 4 mg via INTRAVENOUS

## 2023-02-16 MED ORDER — PROPOFOL 10 MG/ML IV BOLUS
INTRAVENOUS | Status: DC | PRN
Start: 2023-02-16 — End: 2023-02-16
  Administered 2023-02-16: 50 mg via INTRAVENOUS

## 2023-02-16 MED ORDER — LACTATED RINGERS IV SOLN: INTRAVENOUS | Status: DC

## 2023-02-16 MED ORDER — LIDOCAINE 2% (20 MG/ML) 5 ML SYRINGE
INTRAMUSCULAR | Status: DC | PRN
  Administered 2023-02-16: 60 mg via INTRAVENOUS

## 2023-02-16 MED ORDER — SODIUM CHLORIDE 0.9 % IV SOLN: INTRAVENOUS | Status: DC

## 2023-02-16 MED ORDER — PROPOFOL 10 MG/ML IV BOLUS
INTRAVENOUS | Status: AC
  Filled 2023-02-16: qty 20

## 2023-02-16 MED ORDER — PROPOFOL 500 MG/50ML IV EMUL
INTRAVENOUS | Status: DC | PRN
  Administered 2023-02-16: 100 ug/kg/min via INTRAVENOUS

## 2023-02-16 SURGICAL SUPPLY — 22 items

## 2023-02-16 NOTE — Discharge Instructions (Signed)

## 2023-02-16 NOTE — Transfer of Care (Signed)
Immediate Anesthesia Transfer of Care Note  Patient: Tammy [name] Age  Procedure(s) Performed: COLONOSCOPY WITH PROPOFOL POLYPECTOMY  Patient Location: PACU  Anesthesia Type:MAC  Level of Consciousness: awake, alert , oriented, and patient cooperative  Airway & Oxygen Therapy: Patient Spontanous Breathing and Patient connected to face mask oxygen  Post-op Assessment: Report given to RN and Post -op Vital signs reviewed and stable  Post vital signs: Reviewed and stable  Last Vitals:  Vitals Value Taken Time  BP    Temp    Pulse 68 02/16/23 0815  Resp 17 02/16/23 0815  SpO2 98 % 02/16/23 0815  Vitals shown include unfiled device data.  Last Pain:  Vitals:   02/16/23 0700  TempSrc: Temporal  PainSc: 0-No pain         Complications: No notable events documented.

## 2023-02-16 NOTE — Anesthesia Postprocedure Evaluation (Signed)
Anesthesia Post Note  Patient: Tammy [name]  Procedure(s) Performed: COLONOSCOPY WITH PROPOFOL POLYPECTOMY     Patient location during evaluation: Endoscopy Anesthesia Type: MAC Level of consciousness: awake and alert, patient cooperative and oriented Pain management: pain level controlled Vital Signs Assessment: post-procedure vital signs reviewed and stable Respiratory status: nonlabored ventilation, spontaneous breathing and respiratory function stable Cardiovascular status: stable and blood pressure returned to baseline Postop Assessment: no apparent nausea or vomiting and able to ambulate Anesthetic complications: no   No notable events documented.  Last Vitals:  Vitals:   02/16/23 0825 02/16/23 0835  BP: (!) 112/54 (!) 122/54  Pulse: 66 61  Resp: 16 12  Temp:    SpO2: 95% 97%    Last Pain:  Vitals:   02/16/23 0835  TempSrc:   PainSc: 0-No pain                 ,E. 

## 2023-02-16 NOTE — Op Note (Signed)
[facility] Patient Name: Tammy [name] Procedure Date: 02/16/2023 MRN: [id number] Attending MD: Jeani Hawking , MD, [id number] Date of Birth: 12-15-1950 CSN: [id number] Age: 72 Admit Type: Outpatient Procedure:                Colonoscopy Indications:              High risk colon cancer surveillance: Personal                            history of colonic polyps Providers:                Jeani Hawking, MD, Cephus Richer, RN, Marge Duncans, RN, Marja Kays, Technician Referring MD:              Medicines:                Propofol per Anesthesia Complications:            No immediate complications. Estimated Blood Loss:     Estimated blood loss: none. Procedure:                Pre-Anesthesia Assessment:                           - Prior to the procedure, a History and Physical                            was performed, and patient medications and                            allergies were reviewed. [name] tolerance of                            previous anesthesia was also reviewed. The risks                            and benefits of the procedure and the sedation                            options and risks were discussed with the patient.                            All questions were answered, and informed consent                            was obtained. Prior Anticoagulants: The patient has                            taken Eliquis (apixaban), last dose was 2 days                            prior to procedure. ASA Grade Assessment: III - A  patient with severe systemic disease. After                            reviewing the risks and benefits, the patient was                            deemed in satisfactory condition to undergo the                            procedure.                           - Sedation was administered by an anesthesia                            professional. Deep sedation was  attained.                           After obtaining informed consent, the colonoscope                            was passed under direct vision. Throughout the                            procedure, the patient's blood pressure, pulse, and                            oxygen saturations were monitored continuously. The                            CF-HQ190L ([id number]) Olympus colonoscope was                            introduced through the anus and advanced to the the                            cecum, identified by appendiceal orifice and                            ileocecal valve. The colonoscopy was technically                            difficult and complex due to poor bowel prep.                            Successful completion of the procedure was aided by                            lavage. The ileocecal valve, appendiceal orifice,                            and rectum were photographed. The quality of the  bowel preparation was evaluated using the BBPS                            [facility] Bowel Preparation Scale) with scores of:                            Right Colon = 2 (minor amount of residual staining,                            small fragments of stool and/or opaque liquid, but                            mucosa seen well), Transverse Colon = 2 (minor                            amount of residual staining, small fragments of                            stool and/or opaque liquid, but mucosa seen well)                            and Left Colon = 2 (minor amount of residual                            staining, small fragments of stool and/or opaque                            liquid, but mucosa seen well). The total BBPS score                            equals 6. Adequate. The patient tolerated the                            procedure well. Scope In: 7:30:06 AM Scope Out: 8:09:54 AM Scope Withdrawal Time: 0 hours 28 minutes 7 seconds  Total Procedure Duration: 0  hours 39 minutes 48 seconds  Findings:      18 sessile polyps were found in the descending colon, transverse colon,       ascending colon and cecum. The polyps were 2 to 20 mm in size. These       polyps were removed with a cold snare. Resection and retrieval were       complete.      Scattered large-mouthed, medium-mouthed and small-mouthed diverticula       were found in the sigmoid colon and descending colon.      A diffuse area of moderate melanosis was found in the entire colon.      There was a large lipoma, in the ascending colon.      The prep was poor, but extensive washing achieved adequate to good views       of the mucosa. It was also difficult to traverse as a result of looping,       but scope reduction allowed for advancement of the scope to the cecum.       In the cecum there was evidence of  residual polyp at the polypectomy       site. Multiple other polyps developed since the colonoscopy last year. Impression:               - 18 2 to 20 mm polyps in the descending colon, in                            the transverse colon, in the ascending colon and in                            the cecum, removed with a cold snare. Resected and                            retrieved.                           - Diverticulosis in the sigmoid colon and in the                            descending colon.                           - Melanosis in the colon.                           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 Moderate Sedation:      Not Applicable - Patient had care per Anesthesia. Recommendation:           - Patient has a contact number available for                            emergencies. The signs and symptoms of potential                            delayed complications were discussed with the                            patient. Return to normal activities tomorrow.                            Written discharge instructions were provided to the                             patient.                           - Resume previous diet.                           - Continue present medications.                           - Await pathology results.                           - Repeat colonoscopy in 1 year for surveillance.  TWO DAY PREP.                           - Consider genetic testing. Procedure Code(s):        --- Professional ---                           [id number], Colonoscopy, flexible; with removal of                            tumor(s), polyp(s), or other lesion(s) by snare                            technique Diagnosis Code(s):        --- Professional ---                           D12.4, Benign neoplasm of descending colon                           Z86.010, Personal history of colonic polyps                           D12.3, Benign neoplasm of transverse colon (hepatic                            flexure or splenic flexure)                           D12.2, Benign neoplasm of ascending colon                           D12.0, Benign neoplasm of cecum                           K63.89, Other specified diseases of intestine                           D17.5, Benign lipomatous neoplasm of                            intra-abdominal organs                           K57.30, Diverticulosis of large intestine without                            perforation or abscess without bleeding CPT copyright 2022 American [employer] Association. All rights reserved. The codes documented in this report are preliminary and upon coder review may  be revised to meet current compliance requirements. Jeani Hawking, MD Jeani Hawking, MD 02/16/2023 8:23:22 AM This report has been signed electronically. Number of Addenda: 0

## 2023-02-16 NOTE — H&P (Signed)
Tammy [name] HPI: This 72 year old white female presents to the office for 1 year colorectal cancer screening. She has chronic constipation. She takes Colace once a week. She has 1 BM per week, on anm average. There is no obvious blood or mucus in the stool. In December, 2021 she was diagnosed with atrial fibrillation and is now on Eliquis. She has a good appetite. She has gained 33 pounds over the past 1 year. She denies having any complaints of abdominal pain, nausea, vomiting, acid reflux, dysphagia or odynophagia. She denies having a family history of celiac sprue or IBD. Her paternal grandfather was diagnosed with colon cancer in his 22's. She had a colonoscopy done on 11/26/2020 that revealed tubular adenomas and sessile adenomas.   Past Medical History:  Diagnosis Date   Aortic atherosclerosis (HCC)    Atrial fibrillation (HCC)    a.) CHA2DS2VASc = 5 (age, CHF, HTN, vascular disease history, T2DM);  b.) rate/rhythm maintained on oral diltiazem + sotolol; chronically anticoagulated with apixaban   CAD (coronary artery disease)    a.) cCTA 08/21/2014: Ca2+ score 873 (99th percentile for age/sex/race matched control)   CHF (congestive heart failure) (HCC)    a.) TTE 06/17/2020: EF 50-55%, LV dil, mild LVH, mild RVE, mod BAE, G1DD; b.) TTE 12/19/2022: EF >55%, mod LAE, triv TR/PR, G1DD   Complication of anesthesia    COPD (chronic obstructive pulmonary disease) (HCC)    DJD (degenerative joint disease)    Encephalitis    Fibromyalgia    History of kidney stones    HTN (hypertension)    Hyperlipidemia    Long term current use of anticoagulant    a.) apixaban   OAB (overactive bladder)    a.) on vibegron + fesoterodine   OSA on CPAP    PONV (postoperative nausea and vomiting)    T2DM (type 2 diabetes mellitus) (HCC)     Past Surgical History:  Procedure Laterality Date   APPENDECTOMY     BREAST CYST EXCISION     BREAST EXCISIONAL BIOPSY Left 2004   COLONOSCOPY WITH PROPOFOL  N/A 11/26/2020   Procedure: COLONOSCOPY WITH PROPOFOL;  Surgeon: Jeani Hawking, MD;  Location: [facility];  Service: Endoscopy;  Laterality: N/A;   DIAGNOSTIC LAPAROSCOPY     HEMOSTASIS CLIP PLACEMENT  11/26/2020   Procedure: HEMOSTASIS CLIP PLACEMENT;  Surgeon: Jeani Hawking, MD;  Location: [facility];  Service: Endoscopy;;   KNEE ARTHROSCOPY     POLYPECTOMY  11/26/2020   Procedure: POLYPECTOMY;  Surgeon: Jeani Hawking, MD;  Location: [facility];  Service: Endoscopy;;   SUBMUCOSAL TATTOO INJECTION  11/26/2020   Procedure: SUBMUCOSAL TATTOO INJECTION;  Surgeon: Jeani Hawking, MD;  Location: [facility];  Service: Endoscopy;;   TEE WITHOUT CARDIOVERSION N/A 06/29/2020   Procedure: TRANSESOPHAGEAL ECHOCARDIOGRAM (TEE) with DCCV;  Surgeon: Laurier Nancy, MD;  Location: [facility];  Service: Cardiovascular;  Laterality: N/A;    Family History  Problem Relation Age of Onset   CAD Mother 11   Diabetes Father    Heart disease Father    CAD Brother 91   Throat cancer Paternal Uncle    Stomach cancer Maternal Grandfather    Breast cancer Cousin     Social History:  reports that she quit smoking about 14 years ago. Her smoking use included cigarettes. She started smoking about 44 years ago. She has a 30 pack-year smoking history. She has never used smokeless tobacco. She reports that she does not drink alcohol and does not  use drugs.  Allergies:  Allergies  Allergen Reactions   Betadine [Povidone Iodine] Anaphylaxis   Contrast Media [Iodinated Contrast Media] Anaphylaxis   Iodine Anaphylaxis   Metrizamide Anaphylaxis   Povidone-Iodine Anaphylaxis   Shellfish Allergy Anaphylaxis   Hydrocodone-Acetaminophen Nausea Only    Medications: Scheduled: Continuous:  sodium chloride     sodium chloride     lactated ringers 10 mL/hr at 02/16/23 0717    No results found for this or any previous visit (from the past 24 hour(s)).   No results found.  ROS:  As stated above in the HPI  otherwise negative.  Blood pressure (!) 126/55, pulse 67, temperature 98.2 F (36.8 C), temperature source Temporal, resp. rate 12, height 5' (1.524 m), weight 93.4 kg, SpO2 98%.    PE: Gen: NAD, Alert and Oriented HEENT:  [place]/AT, EOMI Neck: Supple, no LAD Lungs: CTA Bilaterally CV: RRR without M/G/R ABD: Soft, NTND, +BS Ext: No C/C/E  Assessment/Plan: 1) Personal history of polyps - colonoscopy.  , D 02/16/2023, 7:21 AM

## 2023-02-17 ENCOUNTER — Encounter (HOSPITAL_COMMUNITY): Payer: Self-pay | Admitting: Gastroenterology

## 2023-02-21 ENCOUNTER — Other Ambulatory Visit: Payer: Self-pay | Admitting: Internal Medicine

## 2023-02-21 DIAGNOSIS — Z1231 Encounter for screening mammogram for malignant neoplasm of breast: Secondary | ICD-10-CM

## 2023-02-23 ENCOUNTER — Ambulatory Visit: Payer: Medicare Other

## 2023-02-26 ENCOUNTER — Ambulatory Visit
Admission: RE | Admit: 2023-02-26 | Discharge: 2023-02-26 | Disposition: A | Discharge status: Home / Self Care | Payer: Medicare Other | Source: Ambulatory Visit | Attending: Internal Medicine | Admitting: Internal Medicine

## 2023-02-26 DIAGNOSIS — Z1231 Encounter for screening mammogram for malignant neoplasm of breast: Secondary | ICD-10-CM

## 2023-03-04 ENCOUNTER — Encounter: Payer: Self-pay | Admitting: Cardiovascular Disease

## 2023-03-05 ENCOUNTER — Other Ambulatory Visit: Payer: Self-pay

## 2023-03-05 DIAGNOSIS — I4891 Unspecified atrial fibrillation: Secondary | ICD-10-CM

## 2023-03-05 MED ORDER — DILTIAZEM HCL ER COATED BEADS 240 MG PO CP24
240.0000 mg | ORAL_CAPSULE | Freq: Every day | ORAL | 0 refills | Status: DC
Start: 2023-03-05 — End: 2023-03-22

## 2023-03-16 DIAGNOSIS — M170 Bilateral primary osteoarthritis of knee: Secondary | ICD-10-CM | POA: Diagnosis not present

## 2023-03-21 DIAGNOSIS — M25662 Stiffness of left knee, not elsewhere classified: Secondary | ICD-10-CM | POA: Diagnosis not present

## 2023-03-21 DIAGNOSIS — M25562 Pain in left knee: Secondary | ICD-10-CM | POA: Diagnosis not present

## 2023-03-21 DIAGNOSIS — M1712 Unilateral primary osteoarthritis, left knee: Secondary | ICD-10-CM | POA: Diagnosis not present

## 2023-03-21 DIAGNOSIS — R262 Difficulty in walking, not elsewhere classified: Secondary | ICD-10-CM | POA: Diagnosis not present

## 2023-03-22 ENCOUNTER — Other Ambulatory Visit: Payer: Medicare Other

## 2023-03-22 ENCOUNTER — Encounter: Payer: Self-pay | Admitting: Cardiovascular Disease

## 2023-03-22 ENCOUNTER — Ambulatory Visit (INDEPENDENT_AMBULATORY_CARE_PROVIDER_SITE_OTHER): Payer: Medicare Other | Admitting: Cardiovascular Disease

## 2023-03-22 ENCOUNTER — Other Ambulatory Visit: Payer: Self-pay

## 2023-03-22 VITALS — BP 121/63 | HR 80 | Ht 60.0 in | Wt 202.0 lb

## 2023-03-22 DIAGNOSIS — R931 Abnormal findings on diagnostic imaging of heart and coronary circulation: Secondary | ICD-10-CM | POA: Diagnosis not present

## 2023-03-22 DIAGNOSIS — R0602 Shortness of breath: Secondary | ICD-10-CM

## 2023-03-22 DIAGNOSIS — I10 Essential (primary) hypertension: Secondary | ICD-10-CM

## 2023-03-22 DIAGNOSIS — I4891 Unspecified atrial fibrillation: Secondary | ICD-10-CM

## 2023-03-22 DIAGNOSIS — R0789 Other chest pain: Secondary | ICD-10-CM | POA: Diagnosis not present

## 2023-03-22 DIAGNOSIS — E782 Mixed hyperlipidemia: Secondary | ICD-10-CM

## 2023-03-22 DIAGNOSIS — I259 Chronic ischemic heart disease, unspecified: Secondary | ICD-10-CM | POA: Diagnosis not present

## 2023-03-22 DIAGNOSIS — R002 Palpitations: Secondary | ICD-10-CM

## 2023-03-22 MED ORDER — PREDNISONE 50 MG PO TABS: ORAL_TABLET | ORAL | 0 refills | Status: DC

## 2023-03-22 MED ORDER — DILTIAZEM HCL ER COATED BEADS 240 MG PO CP24: 240.0000 mg | ORAL_CAPSULE | Freq: Every day | ORAL | 0 refills | Status: DC

## 2023-03-22 MED ORDER — DIPHENHYDRAMINE HCL 50 MG PO TABS: ORAL_TABLET | ORAL | 0 refills | Status: DC

## 2023-03-22 NOTE — Progress Notes (Signed)
Cardiology Office Note   Date:  03/22/2023   ID:  Tammy [name] 1951-01-27, MRN [id number]  PCP:  Cleatis Polka., MD  Cardiologist:  Adrian Blackwater, MD      History of Present Illness: Tammy [name] is a 72 y.o. female who presents for  Chief Complaint  Patient presents with   Follow-up    Doing well      Past Medical History:  Diagnosis Date   Aortic atherosclerosis (HCC)    Atrial fibrillation (HCC)    a.) CHA2DS2VASc = 5 (age, CHF, HTN, vascular disease history, T2DM);  b.) rate/rhythm maintained on oral diltiazem + sotolol; chronically anticoagulated with apixaban   CAD (coronary artery disease)    a.) cCTA 08/21/2014: Ca2+ score 873 (99th percentile for age/sex/race matched control)   CHF (congestive heart failure) (HCC)    a.) TTE 06/17/2020: EF 50-55%, LV dil, mild LVH, mild RVE, mod BAE, G1DD; b.) TTE 12/19/2022: EF >55%, mod LAE, triv TR/PR, G1DD   Complication of anesthesia    COPD (chronic obstructive pulmonary disease) (HCC)    DJD (degenerative joint disease)    Encephalitis    Fibromyalgia    History of kidney stones    HTN (hypertension)    Hyperlipidemia    Long term current use of anticoagulant    a.) apixaban   OAB (overactive bladder)    a.) on vibegron + fesoterodine   OSA on CPAP    PONV (postoperative nausea and vomiting)    T2DM (type 2 diabetes mellitus) (HCC)      Past Surgical History:  Procedure Laterality Date   APPENDECTOMY     BREAST CYST EXCISION     BREAST EXCISIONAL BIOPSY Left 2004   COLONOSCOPY WITH PROPOFOL N/A 11/26/2020   Procedure: COLONOSCOPY WITH PROPOFOL;  Surgeon: Jeani Hawking, MD;  Location: [facility];  Service: Endoscopy;  Laterality: N/A;   COLONOSCOPY WITH PROPOFOL N/A 02/16/2023   Procedure: COLONOSCOPY WITH PROPOFOL;  Surgeon: Jeani Hawking, MD;  Location: [facility];  Service: Gastroenterology;  Laterality: N/A;   DIAGNOSTIC LAPAROSCOPY     HEMOSTASIS CLIP PLACEMENT  11/26/2020    Procedure: HEMOSTASIS CLIP PLACEMENT;  Surgeon: Jeani Hawking, MD;  Location: [facility];  Service: Endoscopy;;   KNEE ARTHROSCOPY     POLYPECTOMY  11/26/2020   Procedure: POLYPECTOMY;  Surgeon: Jeani Hawking, MD;  Location: [facility];  Service: Endoscopy;;   POLYPECTOMY  02/16/2023   Procedure: POLYPECTOMY;  Surgeon: Jeani Hawking, MD;  Location: [facility];  Service: Gastroenterology;;   SUBMUCOSAL TATTOO INJECTION  11/26/2020   Procedure: SUBMUCOSAL TATTOO INJECTION;  Surgeon: Jeani Hawking, MD;  Location: [facility];  Service: Endoscopy;;   TEE WITHOUT CARDIOVERSION N/A 06/29/2020   Procedure: TRANSESOPHAGEAL ECHOCARDIOGRAM (TEE) with DCCV;  Surgeon: Laurier Nancy, MD;  Location: [facility];  Service: Cardiovascular;  Laterality: N/A;     Current Outpatient Medications  Medication Sig Dispense Refill   diphenhydrAMINE (BENADRYL) 50 MG tablet Take 1 tab night before and 1 tab  1/2 before ccta 30 tablet 0   predniSONE (DELTASONE) 50 MG tablet Take 1 tab night before and 1 tab morning of ccta 2 tablet 0   albuterol (VENTOLIN HFA) 108 (90 Base) MCG/ACT inhaler Inhale 2 puffs into the lungs every 6 (six) hours as needed. 8 g 2   apixaban (ELIQUIS) 5 MG TABS tablet Take 1 tablet (5 mg total) by mouth 2 (two) times daily. 60 tablet 0   beta carotene w/minerals (OCUVITE)  tablet Take 1 tablet by mouth daily.     diltiazem (CARDIZEM CD) 240 MG 24 hr capsule Take 1 capsule (240 mg total) by mouth daily. 90 capsule 0   docusate sodium (COLACE) 100 MG capsule Take 100 mg by mouth daily as needed for mild constipation.     DULoxetine (CYMBALTA) 60 MG capsule Take 60 mg by mouth daily.     EPINEPHrine 0.3 mg/0.3 mL IJ SOAJ injection Inject 0.3 mg into the muscle as needed for anaphylaxis.     Evolocumab (REPATHA SURECLICK) 140 MG/ML SOAJ Inject 140 mg into the skin every 14 (fourteen) days.     fesoterodine (TOVIAZ) 4 MG TB24 tablet Take 4 mg by mouth daily.     Fluticasone-Umeclidin-Vilant  (TRELEGY ELLIPTA) 100-62.5-25 MCG/ACT AEPB Inhale 1 Dose into the lungs daily. 180 each 1   furosemide (LASIX) 20 MG tablet Take 1 tablet (20 mg total) by mouth 2 (two) times daily. (Patient taking differently: Take 20 mg by mouth daily.) 30 tablet 1   isosorbide mononitrate (IMDUR) 30 MG 24 hr tablet Take 1 tablet (30 mg total) by mouth daily. 90 tablet 1   metFORMIN (GLUCOPHAGE) 1000 MG tablet Take 1,000 mg by mouth daily with breakfast.      sotalol (BETAPACE) 80 MG tablet Take 1 tablet (80 mg total) by mouth every 12 (twelve) hours. 60 tablet 0   tirzepatide (MOUNJARO) 2.5 MG/0.5ML Pen Inject 10.5 mg into the skin once a week. tuesday     valACYclovir (VALTREX) 1000 MG tablet Take 1,000 mg by mouth daily.     valsartan (DIOVAN) 320 MG tablet Take 320 mg by mouth daily.     Vibegron (GEMTESA) 75 MG TABS Take 1 tablet by mouth daily.     No current facility-administered medications for this visit.    Allergies:   Betadine [povidone iodine], Contrast media [iodinated contrast media], Iodine, Metrizamide, Povidone-iodine, Shellfish allergy, and Hydrocodone-acetaminophen    Social History:   reports that she quit smoking about 14 years ago. Her smoking use included cigarettes. She started smoking about 44 years ago. She has a 30 pack-year smoking history. She has never used smokeless tobacco. She reports that she does not drink alcohol and does not use drugs.   Family History:  family history includes Breast cancer in her cousin; CAD (age of onset: 37) in her mother; CAD (age of onset: 53) in her brother; Diabetes in her father; Heart disease in her father; Stomach cancer in her maternal grandfather; Throat cancer in her paternal uncle.    ROS:     Review of Systems  Constitutional: Negative.   HENT: Negative.    Eyes: Negative.   Respiratory: Negative.    Gastrointestinal: Negative.   Genitourinary: Negative.   Musculoskeletal: Negative.   Skin: Negative.   Neurological: Negative.    Endo/Heme/Allergies: Negative.   Psychiatric/Behavioral: Negative.    All other systems reviewed and are negative.     All other systems are reviewed and negative.    PHYSICAL EXAM: VS:  BP 121/63   Pulse 80   Ht 5' (1.524 m)   Wt 202 lb (91.6 kg)   SpO2 97%   BMI 39.45 kg/m  , BMI Body mass index is 39.45 kg/m. Last weight:  Wt Readings from Last 3 Encounters:  03/22/23 202 lb (91.6 kg)  02/16/23 206 lb (93.4 kg)  01/09/23 215 lb (97.5 kg)     Physical Exam Constitutional:      Appearance: Normal appearance.  Cardiovascular:  Rate and Rhythm: Normal rate and regular rhythm.     Heart sounds: Normal heart sounds.  Pulmonary:     Effort: Pulmonary effort is normal.     Breath sounds: Normal breath sounds.  Musculoskeletal:     Right lower leg: No edema.     Left lower leg: No edema.  Neurological:     Mental Status: She is alert.       EKG:   Recent Labs: 12/25/2022: BUN 15; Creatinine, Ser 0.73; Hemoglobin 12.9; Platelets 292; Potassium 3.6; Sodium 139    Lipid Panel    Component Value Date/Time   CHOL 143 06/17/2020 0450   CHOL 285 (H) 04/23/2015 1144   TRIG 50 06/17/2020 0450   TRIG 140 04/23/2015 1144   HDL 62 06/17/2020 0450   HDL 50 04/23/2015 1144   CHOLHDL 2.3 06/17/2020 0450   VLDL 10 06/17/2020 0450   LDLCALC 71 06/17/2020 0450   LDLCALC 207 (H) 04/23/2015 1144      Other studies Reviewed: Additional studies/ records that were reviewed today include:  Review of the above records demonstrates:      11/23/2016    2:49 PM  PAD Screen  Previous PAD dx? No  Previous surgical procedure? No  Pain with walking? Yes  Subsides with rest? Yes  Feet/toe relief with dangling? Yes  Painful, non-healing ulcers? No  Extremities discolored? No      ASSESSMENT AND PLAN:    ICD-10-CM   1. Other chest pain  R07.89 CT CORONARY MORPH W/CTA COR W/SCORE W/CA W/CM &/OR WO/CM    Comprehensive metabolic panel    Lipid Profile    predniSONE  (DELTASONE) 50 MG tablet    diphenhydrAMINE (BENADRYL) 50 MG tablet   Advise CCTA as had equivacal nuclear stress test    2. Primary hypertension  I10 CT CORONARY MORPH W/CTA COR W/SCORE W/CA W/CM &/OR WO/CM    Comprehensive metabolic panel    Lipid Profile    predniSONE (DELTASONE) 50 MG tablet    diphenhydrAMINE (BENADRYL) 50 MG tablet    3. Elevated coronary artery calcium score  R93.1 CT CORONARY MORPH W/CTA COR W/SCORE W/CA W/CM &/OR WO/CM    Comprehensive metabolic panel    Lipid Profile    predniSONE (DELTASONE) 50 MG tablet    diphenhydrAMINE (BENADRYL) 50 MG tablet    4. Atrial fibrillation with RVR (HCC)  I48.91 CT CORONARY MORPH W/CTA COR W/SCORE W/CA W/CM &/OR WO/CM    Comprehensive metabolic panel    Lipid Profile    predniSONE (DELTASONE) 50 MG tablet    diphenhydrAMINE (BENADRYL) 50 MG tablet    5. Palpitation  R00.2 CT CORONARY MORPH W/CTA COR W/SCORE W/CA W/CM &/OR WO/CM    Comprehensive metabolic panel    Lipid Profile    predniSONE (DELTASONE) 50 MG tablet    diphenhydrAMINE (BENADRYL) 50 MG tablet    6. SOB (shortness of breath)  R06.02 CT CORONARY MORPH W/CTA COR W/SCORE W/CA W/CM &/OR WO/CM    Comprehensive metabolic panel    Lipid Profile    predniSONE (DELTASONE) 50 MG tablet    diphenhydrAMINE (BENADRYL) 50 MG tablet    7. Mixed hyperlipidemia  E78.2 CT CORONARY MORPH W/CTA COR W/SCORE W/CA W/CM &/OR WO/CM    Comprehensive metabolic panel    Lipid Profile    predniSONE (DELTASONE) 50 MG tablet    diphenhydrAMINE (BENADRYL) 50 MG tablet   On repatha but not on statins and zetia as cannot tolerate,, may start Leqvio  8. Chest pain due to myocardial ischemia, unspecified ischemic chest pain type  I25.9 CT CORONARY MORPH W/CTA COR W/SCORE W/CA W/CM &/OR WO/CM    Comprehensive metabolic panel    Lipid Profile    predniSONE (DELTASONE) 50 MG tablet    diphenhydrAMINE (BENADRYL) 50 MG tablet       Problem List Items Addressed This Visit        Cardiovascular and Mediastinum   Atrial fibrillation with RVR (HCC)   Relevant Medications   predniSONE (DELTASONE) 50 MG tablet   diphenhydrAMINE (BENADRYL) 50 MG tablet   Other Relevant Orders   CT CORONARY MORPH W/CTA COR W/SCORE W/CA W/CM &/OR WO/CM   Comprehensive metabolic panel   Lipid Profile   HTN (hypertension)   Relevant Medications   predniSONE (DELTASONE) 50 MG tablet   diphenhydrAMINE (BENADRYL) 50 MG tablet   Other Relevant Orders   CT CORONARY MORPH W/CTA COR W/SCORE W/CA W/CM &/OR WO/CM   Comprehensive metabolic panel   Lipid Profile   Elevated coronary artery calcium score   Relevant Medications   predniSONE (DELTASONE) 50 MG tablet   diphenhydrAMINE (BENADRYL) 50 MG tablet   Other Relevant Orders   CT CORONARY MORPH W/CTA COR W/SCORE W/CA W/CM &/OR WO/CM   Comprehensive metabolic panel   Lipid Profile     Other   Hyperlipidemia   Relevant Medications   predniSONE (DELTASONE) 50 MG tablet   diphenhydrAMINE (BENADRYL) 50 MG tablet   Other Relevant Orders   CT CORONARY MORPH W/CTA COR W/SCORE W/CA W/CM &/OR WO/CM   Comprehensive metabolic panel   Lipid Profile   SOB (shortness of breath)   Relevant Medications   predniSONE (DELTASONE) 50 MG tablet   diphenhydrAMINE (BENADRYL) 50 MG tablet   Other Relevant Orders   CT CORONARY MORPH W/CTA COR W/SCORE W/CA W/CM &/OR WO/CM   Comprehensive metabolic panel   Lipid Profile   Other chest pain - Primary   Relevant Medications   predniSONE (DELTASONE) 50 MG tablet   diphenhydrAMINE (BENADRYL) 50 MG tablet   Other Relevant Orders   CT CORONARY MORPH W/CTA COR W/SCORE W/CA W/CM &/OR WO/CM   Comprehensive metabolic panel   Lipid Profile   Other Visit Diagnoses     Palpitation       Relevant Medications   predniSONE (DELTASONE) 50 MG tablet   diphenhydrAMINE (BENADRYL) 50 MG tablet   Other Relevant Orders   CT CORONARY MORPH W/CTA COR W/SCORE W/CA W/CM &/OR WO/CM   Comprehensive metabolic panel   Lipid  Profile   Chest pain due to myocardial ischemia, unspecified ischemic chest pain type       Relevant Medications   predniSONE (DELTASONE) 50 MG tablet   diphenhydrAMINE (BENADRYL) 50 MG tablet   Other Relevant Orders   CT CORONARY MORPH W/CTA COR W/SCORE W/CA W/CM &/OR WO/CM   Comprehensive metabolic panel   Lipid Profile          Disposition:   Return in about 4 weeks (around 04/19/2023) for ccta and f/u.    Total time spent: 40 minutes  Signed,  Adrian Blackwater, MD  03/22/2023 9:32 AM    [facility]

## 2023-03-23 LAB — COMPREHENSIVE METABOLIC PANEL
ALT: 9 IU/L (ref 0–32)
AST: 15 IU/L (ref 0–40)
Albumin: 4.1 g/dL (ref 3.8–4.8)
Alkaline Phosphatase: 79 IU/L (ref 44–121)
BUN/Creatinine Ratio: 21 (ref 12–28)
BUN: 14 mg/dL (ref 8–27)
Bilirubin Total: 0.6 mg/dL (ref 0.0–1.2)
CO2: 25 mmol/L (ref 20–29)
Calcium: 10.1 mg/dL (ref 8.7–10.3)
Chloride: 100 mmol/L (ref 96–106)
Creatinine, Ser: 0.68 mg/dL (ref 0.57–1.00)
Globulin, Total: 2.8 g/dL (ref 1.5–4.5)
Glucose: 112 mg/dL — ABNORMAL HIGH (ref 70–99)
Potassium: 4.3 mmol/L (ref 3.5–5.2)
Sodium: 140 mmol/L (ref 134–144)
Total Protein: 6.9 g/dL (ref 6.0–8.5)
eGFR: 93 mL/min/{1.73_m2} (ref >59)

## 2023-03-23 LAB — LIPID PANEL
Chol/HDL Ratio: 4.9 ratio — ABNORMAL HIGH (ref 0.0–4.4)
Cholesterol, Total: 265 mg/dL — ABNORMAL HIGH (ref 100–199)
HDL: 54 mg/dL (ref >39)
LDL Chol Calc (NIH): 193 mg/dL — ABNORMAL HIGH (ref 0–99)
Triglycerides: 105 mg/dL (ref 0–149)
VLDL Cholesterol Cal: 18 mg/dL (ref 5–40)

## 2023-03-27 ENCOUNTER — Other Ambulatory Visit: Payer: Self-pay

## 2023-03-27 MED ORDER — POTASSIUM CHLORIDE CRYS ER 20 MEQ PO TBCR: EXTENDED_RELEASE_TABLET | ORAL | 0 refills | Status: DC

## 2023-04-03 ENCOUNTER — Other Ambulatory Visit: Payer: Self-pay | Admitting: Cardiovascular Disease

## 2023-04-04 ENCOUNTER — Telehealth: Payer: Self-pay | Admitting: Cardiovascular Disease

## 2023-04-04 ENCOUNTER — Ambulatory Visit (INDEPENDENT_AMBULATORY_CARE_PROVIDER_SITE_OTHER): Payer: Medicare Other | Admitting: Orthopaedic Surgery

## 2023-04-04 ENCOUNTER — Other Ambulatory Visit (INDEPENDENT_AMBULATORY_CARE_PROVIDER_SITE_OTHER): Payer: Medicare Other

## 2023-04-04 ENCOUNTER — Encounter: Payer: Self-pay | Admitting: Orthopaedic Surgery

## 2023-04-04 VITALS — Ht 61.81 in | Wt 200.8 lb

## 2023-04-04 DIAGNOSIS — G8929 Other chronic pain: Secondary | ICD-10-CM

## 2023-04-04 DIAGNOSIS — M25562 Pain in left knee: Secondary | ICD-10-CM | POA: Diagnosis not present

## 2023-04-04 DIAGNOSIS — M1712 Unilateral primary osteoarthritis, left knee: Secondary | ICD-10-CM | POA: Diagnosis not present

## 2023-04-04 NOTE — Progress Notes (Signed)
The patient is a 72 year old female well-known to me.  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  We last saw her in 2022.  She had been on a weight loss journey and her BMI was well over 40.  She has now lost 80 pounds.  Her BMI today is 36.95.  She still has severe left knee pain and has had steroid injections and hyaluronic acid injections in the past.  She sees her cardiologist on a regular basis and does have atrial fibrillation and is on Eliquis.  She can come off Eliquis for 3 days prior to surgery for knee replacement.  At this point her left knee pain is daily and is detrimentally affecting her mobility, her quality life and her activities day living.  She has tried and failed all forms conservative treatment for over 5 years now.  On examination of her left knee there is valgus malalignment.  There is medial and lateral joint line tenderness and significant patellofemoral crepitation throughout the arc of motion of that knee.  X-rays today of her left knee show severe end-stage arthritis with bone-on-bone wear of all 3 compartments.  This mainly goes the lateral compartment and the patellofemoral joint.  There are osteophytes in all 3 compartments.  We had a long and thorough discussion about knee replacement surgery.  We talked about the risks and benefits of the surgery and what to expect from an intraoperative and postoperative standpoint.  I went over her x-rays with her as well.  She understands she will need to come off Eliquis for 3 days.  She does have a heart procedure next week and she will discuss with her primary care physician clearance for this type of surgery but she has already discussed this once before.  We will work on getting her on the schedule unless we hear otherwise that she is not cleared for surgery.  She does use CPAP at night.

## 2023-04-04 NOTE — Telephone Encounter (Signed)
Patient called in inquiring about the potassium that SK sent in for her. Went over instructions with patient.

## 2023-04-10 ENCOUNTER — Ambulatory Visit (INDEPENDENT_AMBULATORY_CARE_PROVIDER_SITE_OTHER): Payer: Medicare Other

## 2023-04-10 DIAGNOSIS — I259 Chronic ischemic heart disease, unspecified: Secondary | ICD-10-CM

## 2023-04-10 DIAGNOSIS — E782 Mixed hyperlipidemia: Secondary | ICD-10-CM

## 2023-04-10 DIAGNOSIS — R0789 Other chest pain: Secondary | ICD-10-CM

## 2023-04-10 DIAGNOSIS — R072 Precordial pain: Secondary | ICD-10-CM

## 2023-04-10 DIAGNOSIS — R931 Abnormal findings on diagnostic imaging of heart and coronary circulation: Secondary | ICD-10-CM

## 2023-04-10 DIAGNOSIS — R0602 Shortness of breath: Secondary | ICD-10-CM

## 2023-04-10 DIAGNOSIS — R002 Palpitations: Secondary | ICD-10-CM

## 2023-04-10 DIAGNOSIS — I4891 Unspecified atrial fibrillation: Secondary | ICD-10-CM

## 2023-04-10 DIAGNOSIS — I10 Essential (primary) hypertension: Secondary | ICD-10-CM

## 2023-04-10 MED ORDER — IOHEXOL 350 MG/ML SOLN
100.0000 mL | Freq: Once | INTRAVENOUS | Status: AC | PRN
  Administered 2023-04-10: 100 mL via INTRAVENOUS

## 2023-04-13 DIAGNOSIS — M170 Bilateral primary osteoarthritis of knee: Secondary | ICD-10-CM | POA: Diagnosis not present

## 2023-04-20 ENCOUNTER — Ambulatory Visit: Payer: Self-pay | Admitting: Cardiovascular Disease

## 2023-04-20 ENCOUNTER — Other Ambulatory Visit: Payer: Self-pay | Admitting: Cardiovascular Disease

## 2023-04-20 ENCOUNTER — Encounter: Payer: Self-pay | Admitting: Cardiovascular Disease

## 2023-04-20 ENCOUNTER — Ambulatory Visit (INDEPENDENT_AMBULATORY_CARE_PROVIDER_SITE_OTHER): Payer: Medicare Other | Admitting: Cardiovascular Disease

## 2023-04-20 VITALS — BP 140/90 | HR 77 | Ht 61.0 in | Wt 200.6 lb

## 2023-04-20 DIAGNOSIS — I10 Essential (primary) hypertension: Secondary | ICD-10-CM

## 2023-04-20 DIAGNOSIS — R0789 Other chest pain: Secondary | ICD-10-CM

## 2023-04-20 DIAGNOSIS — I259 Chronic ischemic heart disease, unspecified: Secondary | ICD-10-CM

## 2023-04-20 DIAGNOSIS — R0602 Shortness of breath: Secondary | ICD-10-CM

## 2023-04-20 DIAGNOSIS — G4730 Sleep apnea, unspecified: Secondary | ICD-10-CM

## 2023-04-20 DIAGNOSIS — I4891 Unspecified atrial fibrillation: Secondary | ICD-10-CM | POA: Diagnosis not present

## 2023-04-20 DIAGNOSIS — I200 Unstable angina: Secondary | ICD-10-CM | POA: Insufficient documentation

## 2023-04-20 DIAGNOSIS — I5031 Acute diastolic (congestive) heart failure: Secondary | ICD-10-CM | POA: Diagnosis not present

## 2023-04-20 MED ORDER — DILTIAZEM HCL ER COATED BEADS 240 MG PO CP24
240.0000 mg | ORAL_CAPSULE | Freq: Every day | ORAL | 0 refills | Status: DC
Start: 2023-04-20 — End: 2024-06-02

## 2023-04-20 MED ORDER — ISOSORBIDE MONONITRATE ER 30 MG PO TB24
30.0000 mg | ORAL_TABLET | Freq: Two times a day (BID) | ORAL | 3 refills | Status: DC
Start: 2023-04-20 — End: 2023-07-19

## 2023-04-20 MED ORDER — SODIUM CHLORIDE 0.9% FLUSH: 3.0000 mL | Freq: Two times a day (BID) | INTRAVENOUS | Status: AC

## 2023-04-20 NOTE — Addendum Note (Signed)
Addended by: Adrian Blackwater A on: 04/20/2023 10:33 AM   Modules accepted: Orders

## 2023-04-20 NOTE — Progress Notes (Signed)
Cardiology Office Note   Date:  04/20/2023   ID:  Kendallynn, Alvardo 06/03/1951, MRN [id number]  PCP:  Cleatis Polka., MD  Cardiologist:  Adrian Blackwater, MD      History of Present Illness: Tammy [name] is a 72 y.o. female who presents for  Chief Complaint  Patient presents with   Follow-up    1 mo f/u    Band around her chest and lower abdomen  Chest Pain  This is a recurrent problem. The current episode started more than 1 month ago. The problem has been waxing and waning.      Past Medical History:  Diagnosis Date   Aortic atherosclerosis (HCC)    Atrial fibrillation (HCC)    a.) CHA2DS2VASc = 5 (age, CHF, HTN, vascular disease history, T2DM);  b.) rate/rhythm maintained on oral diltiazem + sotolol; chronically anticoagulated with apixaban   CAD (coronary artery disease)    a.) cCTA 08/21/2014: Ca2+ score 873 (99th percentile for age/sex/race matched control)   CHF (congestive heart failure) (HCC)    a.) TTE 06/17/2020: EF 50-55%, LV dil, mild LVH, mild RVE, mod BAE, G1DD; b.) TTE 12/19/2022: EF >55%, mod LAE, triv TR/PR, G1DD   Complication of anesthesia    COPD (chronic obstructive pulmonary disease) (HCC)    DJD (degenerative joint disease)    Encephalitis    Fibromyalgia    History of kidney stones    HTN (hypertension)    Hyperlipidemia    Long term current use of anticoagulant    a.) apixaban   OAB (overactive bladder)    a.) on vibegron + fesoterodine   OSA on CPAP    PONV (postoperative nausea and vomiting)    T2DM (type 2 diabetes mellitus) (HCC)      Past Surgical History:  Procedure Laterality Date   APPENDECTOMY     BREAST CYST EXCISION     BREAST EXCISIONAL BIOPSY Left 2004   COLONOSCOPY WITH PROPOFOL N/A 11/26/2020   Procedure: COLONOSCOPY WITH PROPOFOL;  Surgeon: Jeani Hawking, MD;  Location: [facility];  Service: Endoscopy;  Laterality: N/A;   COLONOSCOPY WITH PROPOFOL N/A 02/16/2023   Procedure: COLONOSCOPY WITH  PROPOFOL;  Surgeon: Jeani Hawking, MD;  Location: [facility];  Service: Gastroenterology;  Laterality: N/A;   DIAGNOSTIC LAPAROSCOPY     HEMOSTASIS CLIP PLACEMENT  11/26/2020   Procedure: HEMOSTASIS CLIP PLACEMENT;  Surgeon: Jeani Hawking, MD;  Location: [facility];  Service: Endoscopy;;   KNEE ARTHROSCOPY     POLYPECTOMY  11/26/2020   Procedure: POLYPECTOMY;  Surgeon: Jeani Hawking, MD;  Location: [facility];  Service: Endoscopy;;   POLYPECTOMY  02/16/2023   Procedure: POLYPECTOMY;  Surgeon: Jeani Hawking, MD;  Location: [facility];  Service: Gastroenterology;;   SUBMUCOSAL TATTOO INJECTION  11/26/2020   Procedure: SUBMUCOSAL TATTOO INJECTION;  Surgeon: Jeani Hawking, MD;  Location: [facility];  Service: Endoscopy;;   TEE WITHOUT CARDIOVERSION N/A 06/29/2020   Procedure: TRANSESOPHAGEAL ECHOCARDIOGRAM (TEE) with DCCV;  Surgeon: Laurier Nancy, MD;  Location: [facility];  Service: Cardiovascular;  Laterality: N/A;     Current Outpatient Medications  Medication Sig Dispense Refill   isosorbide mononitrate (IMDUR) 30 MG 24 hr tablet Take 1 tablet (30 mg total) by mouth 2 (two) times daily. 60 tablet 3   albuterol (VENTOLIN HFA) 108 (90 Base) MCG/ACT inhaler Inhale 2 puffs into the lungs every 6 (six) hours as needed. 8 g 2   apixaban (ELIQUIS) 5 MG TABS tablet Take 1 tablet (  5 mg total) by mouth 2 (two) times daily. 60 tablet 0   beta carotene w/minerals (OCUVITE) tablet Take 1 tablet by mouth daily.     diltiazem (CARDIZEM CD) 240 MG 24 hr capsule Take 1 capsule (240 mg total) by mouth daily. 90 capsule 0   diphenhydrAMINE (BENADRYL) 50 MG tablet Take 1 tab night before and 1 tab  1/2 before ccta 30 tablet 0   docusate sodium (COLACE) 100 MG capsule Take 100 mg by mouth daily as needed for mild constipation.     DULoxetine (CYMBALTA) 60 MG capsule Take 60 mg by mouth daily.     EPINEPHrine 0.3 mg/0.3 mL IJ SOAJ injection Inject 0.3 mg into the muscle as needed for anaphylaxis.      Evolocumab (REPATHA SURECLICK) 140 MG/ML SOAJ Inject 140 mg into the skin every 14 (fourteen) days.     fesoterodine (TOVIAZ) 4 MG TB24 tablet Take 4 mg by mouth daily.     Fluticasone-Umeclidin-Vilant (TRELEGY ELLIPTA) 100-62.5-25 MCG/ACT AEPB Inhale 1 Dose into the lungs daily. 180 each 1   furosemide (LASIX) 20 MG tablet Take 1 tablet (20 mg total) by mouth 2 (two) times daily. (Patient taking differently: Take 20 mg by mouth daily.) 30 tablet 1   metFORMIN (GLUCOPHAGE) 1000 MG tablet Take 1,000 mg by mouth daily with breakfast.      potassium chloride SA (KLOR-CON M) 20 MEQ tablet Take two tablets by mouth x 1 dose for high potassium 2 tablet 0   predniSONE (DELTASONE) 50 MG tablet Take 1 tab night before and 1 tab morning of ccta 2 tablet 0   sotalol (BETAPACE) 80 MG tablet Take 1 tablet (80 mg total) by mouth every 12 (twelve) hours. 60 tablet 0   tirzepatide (MOUNJARO) 2.5 MG/0.5ML Pen Inject 10.5 mg into the skin once a week. tuesday     valACYclovir (VALTREX) 1000 MG tablet Take 1,000 mg by mouth daily.     valsartan (DIOVAN) 320 MG tablet Take 320 mg by mouth daily.     Vibegron (GEMTESA) 75 MG TABS Take 1 tablet by mouth daily.     No current facility-administered medications for this visit.    Allergies:   Betadine [povidone iodine], Contrast media [iodinated contrast media], Iodine, Metrizamide, Povidone-iodine, Shellfish allergy, and Hydrocodone-acetaminophen    Social History:   reports that she quit smoking about 14 years ago. Her smoking use included cigarettes. She started smoking about 44 years ago. She has a 30 pack-year smoking history. She has never used smokeless tobacco. She reports that she does not drink alcohol and does not use drugs.   Family History:  family history includes Breast cancer in her cousin; CAD (age of onset: 80) in her mother; CAD (age of onset: 89) in her brother; Diabetes in her father; Heart disease in her father; Stomach cancer in her maternal  grandfather; Throat cancer in her paternal uncle.    ROS:     Review of Systems  Constitutional: Negative.   HENT: Negative.    Eyes: Negative.   Respiratory: Negative.    Cardiovascular:  Positive for chest pain.  Gastrointestinal: Negative.   Genitourinary: Negative.   Musculoskeletal: Negative.   Skin: Negative.   Neurological: Negative.   Endo/Heme/Allergies: Negative.   Psychiatric/Behavioral: Negative.    All other systems reviewed and are negative.     All other systems are reviewed and negative.    PHYSICAL EXAM: VS:  BP (!) 140/90   Pulse 77   Ht 5'  1" (1.549 m)   Wt 200 lb 9.6 oz (91 kg)   SpO2 93%   BMI 37.90 kg/m  , BMI Body mass index is 37.9 kg/m. Last weight:  Wt Readings from Last 3 Encounters:  04/20/23 200 lb 9.6 oz (91 kg)  04/04/23 200 lb 12.8 oz (91.1 kg)  03/22/23 202 lb (91.6 kg)     Physical Exam    EKG:   Recent Labs: 12/25/2022: Hemoglobin 12.9; Platelets 292 03/22/2023: ALT 9; BUN 14; Creatinine, Ser 0.68; Potassium 4.3; Sodium 140    Lipid Panel    Component Value Date/Time   CHOL 265 (H) 03/22/2023 1115   CHOL 285 (H) 04/23/2015 1144   TRIG 105 03/22/2023 1115   TRIG 140 04/23/2015 1144   HDL 54 03/22/2023 1115   HDL 50 04/23/2015 1144   CHOLHDL 4.9 (H) 03/22/2023 1115   CHOLHDL 2.3 06/17/2020 0450   VLDL 10 06/17/2020 0450   LDLCALC 193 (H) 03/22/2023 1115   LDLCALC 207 (H) 04/23/2015 1144      Other studies Reviewed: Additional studies/ records that were reviewed today include:  Review of the above records demonstrates:      11/23/2016    2:49 PM  PAD Screen  Previous PAD dx? No  Previous surgical procedure? No  Pain with walking? Yes  Subsides with rest? Yes  Feet/toe relief with dangling? Yes  Painful, non-healing ulcers? No  Extremities discolored? No      ASSESSMENT AND PLAN:    ICD-10-CM   1. Primary hypertension  I10 isosorbide mononitrate (IMDUR) 30 MG 24 hr tablet    diltiazem (CARDIZEM CD)  240 MG 24 hr capsule    2. Atrial fibrillation with RVR (HCC)  I48.91 isosorbide mononitrate (IMDUR) 30 MG 24 hr tablet    diltiazem (CARDIZEM CD) 240 MG 24 hr capsule    3. Atrial fibrillation with rapid ventricular response (HCC)  I48.91 isosorbide mononitrate (IMDUR) 30 MG 24 hr tablet    diltiazem (CARDIZEM CD) 240 MG 24 hr capsule    4. Acute diastolic CHF (congestive heart failure) (HCC)  I50.31 isosorbide mononitrate (IMDUR) 30 MG 24 hr tablet    diltiazem (CARDIZEM CD) 240 MG 24 hr capsule    5. Sleep apnea, unspecified type  G47.30 isosorbide mononitrate (IMDUR) 30 MG 24 hr tablet    diltiazem (CARDIZEM CD) 240 MG 24 hr capsule    6. SOB (shortness of breath)  R06.02 isosorbide mononitrate (IMDUR) 30 MG 24 hr tablet    diltiazem (CARDIZEM CD) 240 MG 24 hr capsule    7. Other chest pain  R07.89    Unstable angina, with ca score  733, LAD 50%, RCA 60%, on ccta       Problem List Items Addressed This Visit       Cardiovascular and Mediastinum   Atrial fibrillation with RVR (HCC)   Relevant Medications   isosorbide mononitrate (IMDUR) 30 MG 24 hr tablet   diltiazem (CARDIZEM CD) 240 MG 24 hr capsule   HTN (hypertension) - Primary   Relevant Medications   isosorbide mononitrate (IMDUR) 30 MG 24 hr tablet   diltiazem (CARDIZEM CD) 240 MG 24 hr capsule   Acute diastolic CHF (congestive heart failure) (HCC)   Relevant Medications   isosorbide mononitrate (IMDUR) 30 MG 24 hr tablet   diltiazem (CARDIZEM CD) 240 MG 24 hr capsule   Atrial fibrillation with rapid ventricular response (HCC)   Relevant Medications   isosorbide mononitrate (IMDUR) 30 MG 24 hr tablet  diltiazem (CARDIZEM CD) 240 MG 24 hr capsule     Respiratory   Sleep apnea   Relevant Medications   isosorbide mononitrate (IMDUR) 30 MG 24 hr tablet   diltiazem (CARDIZEM CD) 240 MG 24 hr capsule     Other   SOB (shortness of breath)   Relevant Medications   isosorbide mononitrate (IMDUR) 30 MG 24 hr  tablet   diltiazem (CARDIZEM CD) 240 MG 24 hr capsule   Other chest pain       Disposition:   Return for set up cardiac cath next week, first case.    Total time spent: 50 minutes  Signed,  Adrian Blackwater, MD  04/20/2023 9:54 AM    [facility]

## 2023-04-21 LAB — CBC WITH DIFFERENTIAL/PLATELET
Basophils Absolute: 0.1 10*3/uL (ref 0.0–0.2)
Basos: 1 %
EOS (ABSOLUTE): 0.1 10*3/uL (ref 0.0–0.4)
Eos: 1 %
Hematocrit: 41.1 % (ref 34.0–46.6)
Hemoglobin: 12.8 g/dL (ref 11.1–15.9)
Immature Grans (Abs): 0 10*3/uL (ref 0.0–0.1)
Immature Granulocytes: 0 %
Lymphocytes Absolute: 2.4 10*3/uL (ref 0.7–3.1)
Lymphs: 29 %
MCH: 27.9 pg (ref 26.6–33.0)
MCHC: 31.1 g/dL — ABNORMAL LOW (ref 31.5–35.7)
MCV: 90 fL (ref 79–97)
Monocytes Absolute: 0.7 10*3/uL (ref 0.1–0.9)
Monocytes: 9 %
Neutrophils Absolute: 5.1 10*3/uL (ref 1.4–7.0)
Neutrophils: 60 %
Platelets: 288 10*3/uL (ref 150–450)
RBC: 4.59 x10E6/uL (ref 3.77–5.28)
RDW: 15.4 % (ref 11.7–15.4)
WBC: 8.4 10*3/uL (ref 3.4–10.8)

## 2023-04-21 LAB — BASIC METABOLIC PANEL
BUN/Creatinine Ratio: 14 (ref 12–28)
BUN: 10 mg/dL (ref 8–27)
CO2: 25 mmol/L (ref 20–29)
Calcium: 9.4 mg/dL (ref 8.7–10.3)
Chloride: 101 mmol/L (ref 96–106)
Creatinine, Ser: 0.72 mg/dL (ref 0.57–1.00)
Glucose: 95 mg/dL (ref 70–99)
Potassium: 4.2 mmol/L (ref 3.5–5.2)
Sodium: 140 mmol/L (ref 134–144)
eGFR: 89 mL/min/{1.73_m2} (ref >59)

## 2023-04-24 ENCOUNTER — Ambulatory Visit
Admission: RE | Admit: 2023-04-24 | Discharge: 2023-04-24 | Disposition: A | Discharge status: Home / Self Care | Payer: Medicare Other | Attending: Cardiovascular Disease | Admitting: Cardiovascular Disease

## 2023-04-24 ENCOUNTER — Encounter
Admission: RE | Disposition: A | Discharge status: Home / Self Care | Payer: Self-pay | Source: Home / Self Care | Attending: Cardiovascular Disease

## 2023-04-24 ENCOUNTER — Encounter: Payer: Self-pay | Admitting: Cardiovascular Disease

## 2023-04-24 DIAGNOSIS — I200 Unstable angina: Secondary | ICD-10-CM

## 2023-04-24 DIAGNOSIS — I259 Chronic ischemic heart disease, unspecified: Secondary | ICD-10-CM

## 2023-04-24 DIAGNOSIS — I25110 Atherosclerotic heart disease of native coronary artery with unstable angina pectoris: Secondary | ICD-10-CM | POA: Diagnosis not present

## 2023-04-24 DIAGNOSIS — R0789 Other chest pain: Secondary | ICD-10-CM

## 2023-04-24 HISTORY — PX: LEFT HEART CATH AND CORONARY ANGIOGRAPHY: CATH118249

## 2023-04-24 LAB — GLUCOSE, CAPILLARY
Glucose-Capillary: 96 mg/dL (ref 70–99)
Glucose-Capillary: 110 mg/dL — ABNORMAL HIGH (ref 70–99)

## 2023-04-24 SURGERY — LEFT HEART CATH AND CORONARY ANGIOGRAPHY
Anesthesia: Moderate Sedation

## 2023-04-24 MED ORDER — FENTANYL CITRATE (PF) 100 MCG/2ML IJ SOLN
INTRAMUSCULAR | Status: AC
  Filled 2023-04-24: qty 2

## 2023-04-24 MED ORDER — SODIUM CHLORIDE 0.9% FLUSH: 3.0000 mL | Freq: Two times a day (BID) | INTRAVENOUS | Status: DC

## 2023-04-24 MED ORDER — METHYLPREDNISOLONE SODIUM SUCC 125 MG IJ SOLR
INTRAMUSCULAR | Status: AC
  Filled 2023-04-24: qty 2

## 2023-04-24 MED ORDER — SODIUM CHLORIDE 0.9 % WEIGHT BASED INFUSION: 3.0000 mL/kg/h | INTRAVENOUS | Status: DC

## 2023-04-24 MED ORDER — METHYLPREDNISOLONE SODIUM SUCC 125 MG IJ SOLR
40.0000 mg | INTRAMUSCULAR | Status: DC
  Administered 2023-04-24: 40 mg via INTRAVENOUS

## 2023-04-24 MED ORDER — MIDAZOLAM HCL 2 MG/2ML IJ SOLN
INTRAMUSCULAR | Status: DC | PRN
  Administered 2023-04-24: 1 mg via INTRAVENOUS

## 2023-04-24 MED ORDER — SODIUM CHLORIDE 0.9 % WEIGHT BASED INFUSION
3.0000 mL/kg/h | INTRAVENOUS | Status: DC
  Administered 2023-04-24: 3 mL/kg/h via INTRAVENOUS

## 2023-04-24 MED ORDER — SODIUM CHLORIDE 0.9 % IV SOLN: 250.0000 mL | INTRAVENOUS | Status: DC | PRN

## 2023-04-24 MED ORDER — MIDAZOLAM HCL 2 MG/2ML IJ SOLN
INTRAMUSCULAR | Status: AC
  Filled 2023-04-24: qty 2

## 2023-04-24 MED ORDER — HEPARIN (PORCINE) IN NACL 1000-0.9 UT/500ML-% IV SOLN
INTRAVENOUS | Status: AC
  Filled 2023-04-24: qty 1000

## 2023-04-24 MED ORDER — FENTANYL CITRATE (PF) 100 MCG/2ML IJ SOLN
INTRAMUSCULAR | Status: DC | PRN
  Administered 2023-04-24: 25 ug via INTRAVENOUS

## 2023-04-24 MED ORDER — ASPIRIN 81 MG PO CHEW
CHEWABLE_TABLET | ORAL | Status: AC
  Filled 2023-04-24: qty 1

## 2023-04-24 MED ORDER — DIPHENHYDRAMINE HCL 50 MG/ML IJ SOLN
50.0000 mg | Freq: Once | INTRAMUSCULAR | Status: AC
  Administered 2023-04-24: 50 mg via INTRAVENOUS

## 2023-04-24 MED ORDER — SODIUM CHLORIDE 0.9 % WEIGHT BASED INFUSION: 1.0000 mL/kg/h | INTRAVENOUS | Status: DC

## 2023-04-24 MED ORDER — SODIUM CHLORIDE 0.9% FLUSH: 3.0000 mL | INTRAVENOUS | Status: DC | PRN

## 2023-04-24 MED ORDER — IOHEXOL 300 MG/ML  SOLN
INTRAMUSCULAR | Status: DC | PRN
  Administered 2023-04-24: 55 mL

## 2023-04-24 MED ORDER — LIDOCAINE HCL (PF) 1 % IJ SOLN
INTRAMUSCULAR | Status: DC | PRN
  Administered 2023-04-24: 2 mL

## 2023-04-24 MED ORDER — DIPHENHYDRAMINE HCL 25 MG PO CAPS: 50.0000 mg | ORAL_CAPSULE | Freq: Once | ORAL | Status: AC

## 2023-04-24 MED ORDER — ASPIRIN 81 MG PO CHEW: 81.0000 mg | CHEWABLE_TABLET | ORAL | Status: DC

## 2023-04-24 MED ORDER — ASPIRIN 81 MG PO CHEW
81.0000 mg | CHEWABLE_TABLET | ORAL | Status: AC
  Administered 2023-04-24: 81 mg via ORAL

## 2023-04-24 MED ORDER — HEPARIN (PORCINE) IN NACL 2000-0.9 UNIT/L-% IV SOLN
INTRAVENOUS | Status: DC | PRN
  Administered 2023-04-24: 1000 mL

## 2023-04-24 MED ORDER — DIPHENHYDRAMINE HCL 50 MG/ML IJ SOLN
INTRAMUSCULAR | Status: AC
  Filled 2023-04-24: qty 1

## 2023-04-24 SURGICAL SUPPLY — 11 items
CATH INFINITI 5FR MULTPACK ANG (CATHETERS) IMPLANT
DEVICE CLOSURE MYNXGRIP 5F (Vascular Products) IMPLANT
GUIDEWIRE EMER 3M J .025X150CM (WIRE) IMPLANT
NDL PERC 18GX7CM (NEEDLE) IMPLANT
NEEDLE PERC 18GX7CM (NEEDLE) ×1
PACK CARDIAC CATH (CUSTOM PROCEDURE TRAY) ×1 IMPLANT
PROTECTION STATION PRESSURIZED (MISCELLANEOUS) ×1
SET ATX-X65L (MISCELLANEOUS) IMPLANT
SHEATH AVANTI 5FR X 11CM (SHEATH) IMPLANT
STATION PROTECTION PRESSURIZED (MISCELLANEOUS) IMPLANT
WIRE EMERALD 3MM-J .035X150CM (WIRE) IMPLANT

## 2023-04-25 ENCOUNTER — Encounter: Payer: Self-pay | Admitting: Cardiovascular Disease

## 2023-05-01 ENCOUNTER — Ambulatory Visit (INDEPENDENT_AMBULATORY_CARE_PROVIDER_SITE_OTHER): Payer: Medicare Other | Admitting: Cardiovascular Disease

## 2023-05-01 ENCOUNTER — Encounter: Payer: Self-pay | Admitting: Cardiovascular Disease

## 2023-05-01 ENCOUNTER — Ambulatory Visit: Payer: Medicare Other | Attending: Pulmonary Disease

## 2023-05-01 VITALS — BP 115/70 | HR 89 | Ht 61.0 in | Wt 200.2 lb

## 2023-05-01 DIAGNOSIS — I10 Essential (primary) hypertension: Secondary | ICD-10-CM

## 2023-05-01 DIAGNOSIS — I5031 Acute diastolic (congestive) heart failure: Secondary | ICD-10-CM | POA: Diagnosis not present

## 2023-05-01 DIAGNOSIS — I2510 Atherosclerotic heart disease of native coronary artery without angina pectoris: Secondary | ICD-10-CM

## 2023-05-01 DIAGNOSIS — R931 Abnormal findings on diagnostic imaging of heart and coronary circulation: Secondary | ICD-10-CM | POA: Diagnosis not present

## 2023-05-01 DIAGNOSIS — E782 Mixed hyperlipidemia: Secondary | ICD-10-CM | POA: Diagnosis not present

## 2023-05-01 DIAGNOSIS — J449 Chronic obstructive pulmonary disease, unspecified: Secondary | ICD-10-CM | POA: Insufficient documentation

## 2023-05-01 DIAGNOSIS — Z87891 Personal history of nicotine dependence: Secondary | ICD-10-CM | POA: Insufficient documentation

## 2023-05-01 DIAGNOSIS — G4730 Sleep apnea, unspecified: Secondary | ICD-10-CM | POA: Diagnosis not present

## 2023-05-01 MED ORDER — NEXLETOL 180 MG PO TABS
180.0000 mg | ORAL_TABLET | Freq: Every day | ORAL | 3 refills | Status: DC
Start: 2023-05-01 — End: 2023-07-19

## 2023-05-01 NOTE — Progress Notes (Signed)
Cardiology Office Note   Date:  05/01/2023   ID:  Tammy [name], Tammy [name] Feb 07, 1951, MRN [id number]  PCP:  Cleatis Polka., MD  Cardiologist:  Adrian Blackwater, MD      History of Present Illness: Tammy [name] is a 72 y.o. female who presents for  Chief Complaint  Patient presents with   Follow-up    F/u after cath    Had cardiac cath doing well after ward      Past Medical History:  Diagnosis Date   Aortic atherosclerosis [facility])    Atrial fibrillation (HCC)    a.) CHA2DS2VASc = 5 (age, CHF, HTN, vascular disease history, T2DM);  b.) rate/rhythm maintained on oral diltiazem + sotolol; chronically anticoagulated with apixaban   CAD (coronary artery disease)    a.) cCTA 08/21/2014: Ca2+ score 873 (99th percentile for age/sex/race matched control)   CHF (congestive heart failure) (HCC)    a.) TTE 06/17/2020: EF 50-55%, LV dil, mild LVH, mild RVE, mod BAE, G1DD; b.) TTE 12/19/2022: EF >55%, mod LAE, triv TR/PR, G1DD   Complication of anesthesia    COPD (chronic obstructive pulmonary disease) (HCC)    DJD (degenerative joint disease)    Encephalitis    Fibromyalgia    History of kidney stones    HTN (hypertension)    Hyperlipidemia    Long term current use of anticoagulant    a.) apixaban   OAB (overactive bladder)    a.) on vibegron + fesoterodine   OSA on CPAP    PONV (postoperative nausea and vomiting)    T2DM (type 2 diabetes mellitus) (HCC)      Past Surgical History:  Procedure Laterality Date   APPENDECTOMY     BACK SURGERY  2022   BREAST CYST EXCISION     BREAST EXCISIONAL BIOPSY Left 2004   COLONOSCOPY WITH PROPOFOL N/A 11/26/2020   Procedure: COLONOSCOPY WITH PROPOFOL;  Surgeon: Jeani Hawking, MD;  Location: [facility];  Service: Endoscopy;  Laterality: N/A;   COLONOSCOPY WITH PROPOFOL N/A 02/16/2023   Procedure: COLONOSCOPY WITH PROPOFOL;  Surgeon: Jeani Hawking, MD;  Location: [facility];  Service: Gastroenterology;  Laterality:  N/A;   DIAGNOSTIC LAPAROSCOPY     HEMOSTASIS CLIP PLACEMENT  11/26/2020   Procedure: HEMOSTASIS CLIP PLACEMENT;  Surgeon: Jeani Hawking, MD;  Location: [facility];  Service: Endoscopy;;   KNEE ARTHROSCOPY     LEFT HEART CATH AND CORONARY ANGIOGRAPHY N/A 04/24/2023   Procedure: LEFT HEART CATH AND CORONARY ANGIOGRAPHY;  Surgeon: Laurier Nancy, MD;  Location: [facility];  Service: Cardiovascular;  Laterality: N/A;   POLYPECTOMY  11/26/2020   Procedure: POLYPECTOMY;  Surgeon: Jeani Hawking, MD;  Location: [facility];  Service: Endoscopy;;   POLYPECTOMY  02/16/2023   Procedure: POLYPECTOMY;  Surgeon: Jeani Hawking, MD;  Location: [facility];  Service: Gastroenterology;;   SUBMUCOSAL TATTOO INJECTION  11/26/2020   Procedure: SUBMUCOSAL TATTOO INJECTION;  Surgeon: Jeani Hawking, MD;  Location: [facility];  Service: Endoscopy;;   TEE WITHOUT CARDIOVERSION N/A 06/29/2020   Procedure: TRANSESOPHAGEAL ECHOCARDIOGRAM (TEE) with DCCV;  Surgeon: Laurier Nancy, MD;  Location: [facility];  Service: Cardiovascular;  Laterality: N/A;     Current Outpatient Medications  Medication Sig Dispense Refill   Bempedoic Acid (NEXLETOL) 180 MG TABS Take 1 tablet (180 mg total) by mouth daily. 30 tablet 3   albuterol (VENTOLIN HFA) 108 (90 Base) MCG/ACT inhaler Inhale 2 puffs into the lungs every 6 (six) hours as needed. 8  g 2   beta carotene w/minerals (OCUVITE) tablet Take 1 tablet by mouth daily.     diltiazem (CARDIZEM CD) 240 MG 24 hr capsule Take 1 capsule (240 mg total) by mouth daily. 90 capsule 0   diphenhydrAMINE (BENADRYL) 50 MG tablet Take 1 tab night before and 1 tab  1/2 before ccta 30 tablet 0   docusate sodium (COLACE) 100 MG capsule Take 100 mg by mouth daily as needed for mild constipation.     DULoxetine (CYMBALTA) 60 MG capsule Take 60 mg by mouth daily.     EPINEPHrine 0.3 mg/0.3 mL IJ SOAJ injection Inject 0.3 mg into the muscle as needed for anaphylaxis.     Evolocumab  (REPATHA SURECLICK) 140 MG/ML SOAJ Inject 140 mg into the skin every 14 (fourteen) days.     fesoterodine (TOVIAZ) 4 MG TB24 tablet Take 4 mg by mouth daily.     Fluticasone-Umeclidin-Vilant (TRELEGY ELLIPTA) 100-62.5-25 MCG/ACT AEPB Inhale 1 Dose into the lungs daily. 180 each 1   furosemide (LASIX) 20 MG tablet Take 1 tablet (20 mg total) by mouth 2 (two) times daily. (Patient taking differently: Take 20 mg by mouth daily.) 30 tablet 1   isosorbide mononitrate (IMDUR) 30 MG 24 hr tablet Take 1 tablet (30 mg total) by mouth 2 (two) times daily. 60 tablet 3   metFORMIN (GLUCOPHAGE) 1000 MG tablet Take 1,000 mg by mouth daily with breakfast.      potassium chloride SA (KLOR-CON M) 20 MEQ tablet Take two tablets by mouth x 1 dose for high potassium 2 tablet 0   predniSONE (DELTASONE) 50 MG tablet Take 1 tab night before and 1 tab morning of ccta 2 tablet 0   sotalol (BETAPACE) 80 MG tablet Take 1 tablet (80 mg total) by mouth every 12 (twelve) hours. 60 tablet 0   tirzepatide (MOUNJARO) 2.5 MG/0.5ML Pen Inject 10.5 mg into the skin once a week. tuesday     valACYclovir (VALTREX) 1000 MG tablet Take 1,000 mg by mouth daily.     valsartan (DIOVAN) 320 MG tablet Take 320 mg by mouth daily.     Vibegron (GEMTESA) 75 MG TABS Take 1 tablet by mouth daily.     No current facility-administered medications for this visit.   Facility-Administered Medications Ordered in Other Visits  Medication Dose Route Frequency Provider Last Rate Last Admin   sodium chloride flush (NS) 0.9 % injection 3 mL  3 mL Intravenous Q12H Adrian Blackwater A, MD        Allergies:   Betadine [povidone iodine], Contrast media [iodinated contrast media], Iodine, Metrizamide, Povidone-iodine, Shellfish allergy, and Hydrocodone-acetaminophen    Social History:   reports that she quit smoking about 14 years ago. Her smoking use included cigarettes. She started smoking about 44 years ago. She has a 30 pack-year smoking history. She has  never used smokeless tobacco. She reports that she does not drink alcohol and does not use drugs.   Family History:  family history includes Breast cancer in her cousin; CAD (age of onset: 14) in her mother; CAD (age of onset: 80) in her brother; Diabetes in her father; Heart disease in her father; Stomach cancer in her maternal grandfather; Throat cancer in her paternal uncle.    ROS:     Review of Systems  Constitutional: Negative.   HENT: Negative.    Eyes: Negative.   Respiratory: Negative.    Gastrointestinal: Negative.   Genitourinary: Negative.   Musculoskeletal: Negative.   Skin: Negative.  Neurological: Negative.   Endo/Heme/Allergies: Negative.   Psychiatric/Behavioral: Negative.    All other systems reviewed and are negative.     All other systems are reviewed and negative.    PHYSICAL EXAM: VS:  BP 115/70   Pulse 89   Ht 5\' 1"  (1.549 m)   Wt 200 lb 3.2 oz (90.8 kg)   SpO2 95%   BMI 37.83 kg/m  , BMI Body mass index is 37.83 kg/m. Last weight:  Wt Readings from Last 3 Encounters:  05/01/23 200 lb 3.2 oz (90.8 kg)  04/24/23 198 lb (89.8 kg)  04/20/23 200 lb 9.6 oz (91 kg)     Physical Exam Constitutional:      Appearance: Normal appearance.  Cardiovascular:     Rate and Rhythm: Normal rate and regular rhythm.     Heart sounds: Normal heart sounds.  Pulmonary:     Effort: Pulmonary effort is normal.     Breath sounds: Normal breath sounds.  Musculoskeletal:     Right lower leg: No edema.     Left lower leg: No edema.  Neurological:     Mental Status: She is alert.       EKG:   Recent Labs: 03/22/2023: ALT 9 04/20/2023: BUN 10; Creatinine, Ser 0.72; Hemoglobin 12.8; Platelets 288; Potassium 4.2; Sodium 140    Lipid Panel    Component Value Date/Time   CHOL 265 (H) 03/22/2023 1115   CHOL 285 (H) 04/23/2015 1144   TRIG 105 03/22/2023 1115   TRIG 140 04/23/2015 1144   HDL 54 03/22/2023 1115   HDL 50 04/23/2015 1144   CHOLHDL 4.9 (H)  03/22/2023 1115   CHOLHDL 2.3 06/17/2020 0450   VLDL 10 06/17/2020 0450   LDLCALC 193 (H) 03/22/2023 1115   LDLCALC 207 (H) 04/23/2015 1144      Other studies Reviewed: Additional studies/ records that were reviewed today include:  Review of the above records demonstrates:      11/23/2016    2:49 PM  PAD Screen  Previous PAD dx? No  Previous surgical procedure? No  Pain with walking? Yes  Subsides with rest? Yes  Feet/toe relief with dangling? Yes  Painful, non-healing ulcers? No  Extremities discolored? No      ASSESSMENT AND PLAN:    ICD-10-CM   1. Primary hypertension  I10 Bempedoic Acid (NEXLETOL) 180 MG TABS    2. Sleep apnea, unspecified type  G47.30 Bempedoic Acid (NEXLETOL) 180 MG TABS    3. Acute diastolic CHF (congestive heart failure) (HCC)  I50.31 Bempedoic Acid (NEXLETOL) 180 MG TABS    4. Elevated coronary artery calcium score  R93.1 Bempedoic Acid (NEXLETOL) 180 MG TABS    5. Mixed hyperlipidemia  E78.2 Bempedoic Acid (NEXLETOL) 180 MG TABS   continue repatha, LDL 170, unable to take statins, add nexletol 180 as had cramps with zetia alsopo    6. Coronary artery disease involving native coronary artery of native heart without angina pectoris  I25.10 Bempedoic Acid (NEXLETOL) 180 MG TABS   mild CAD on cath, non-obstructibve. Right groin no hematoma       Problem List Items Addressed This Visit       Cardiovascular and Mediastinum   HTN (hypertension) - Primary   Relevant Medications   Bempedoic Acid (NEXLETOL) 180 MG TABS   Acute diastolic CHF (congestive heart failure) (HCC)   Relevant Medications   Bempedoic Acid (NEXLETOL) 180 MG TABS   Elevated coronary artery calcium score   Relevant Medications  Bempedoic Acid (NEXLETOL) 180 MG TABS     Respiratory   Sleep apnea   Relevant Medications   Bempedoic Acid (NEXLETOL) 180 MG TABS     Other   Hyperlipidemia   Relevant Medications   Bempedoic Acid (NEXLETOL) 180 MG TABS   Other Visit  Diagnoses     Coronary artery disease involving native coronary artery of native heart without angina pectoris       mild CAD on cath, non-obstructibve. Right groin no hematoma   Relevant Medications   Bempedoic Acid (NEXLETOL) 180 MG TABS          Disposition:   Return in about 3 months (around 08/01/2023).    Total time spent: 35 minutes  Signed,  Adrian Blackwater, MD  05/01/2023 11:08 AM    [facility]

## 2023-05-02 DIAGNOSIS — G250 Essential tremor: Secondary | ICD-10-CM | POA: Diagnosis not present

## 2023-05-02 DIAGNOSIS — E1149 Type 2 diabetes mellitus with other diabetic neurological complication: Secondary | ICD-10-CM | POA: Diagnosis not present

## 2023-05-02 DIAGNOSIS — I700 Atherosclerosis of aorta: Secondary | ICD-10-CM | POA: Diagnosis not present

## 2023-05-02 DIAGNOSIS — I10 Essential (primary) hypertension: Secondary | ICD-10-CM | POA: Diagnosis not present

## 2023-05-02 DIAGNOSIS — D6869 Other thrombophilia: Secondary | ICD-10-CM | POA: Diagnosis not present

## 2023-05-02 DIAGNOSIS — J439 Emphysema, unspecified: Secondary | ICD-10-CM | POA: Diagnosis not present

## 2023-05-02 DIAGNOSIS — E785 Hyperlipidemia, unspecified: Secondary | ICD-10-CM | POA: Diagnosis not present

## 2023-05-02 DIAGNOSIS — I480 Paroxysmal atrial fibrillation: Secondary | ICD-10-CM | POA: Diagnosis not present

## 2023-05-02 DIAGNOSIS — G729 Myopathy, unspecified: Secondary | ICD-10-CM | POA: Diagnosis not present

## 2023-05-02 DIAGNOSIS — F17201 Nicotine dependence, unspecified, in remission: Secondary | ICD-10-CM | POA: Diagnosis not present

## 2023-05-02 DIAGNOSIS — A6000 Herpesviral infection of urogenital system, unspecified: Secondary | ICD-10-CM | POA: Diagnosis not present

## 2023-05-03 ENCOUNTER — Ambulatory Visit (INDEPENDENT_AMBULATORY_CARE_PROVIDER_SITE_OTHER): Payer: Medicare Other | Admitting: Pulmonary Disease

## 2023-05-03 ENCOUNTER — Encounter: Payer: Self-pay | Admitting: Pulmonary Disease

## 2023-05-03 VITALS — BP 124/84 | HR 76 | Temp 97.8°F | Ht 61.0 in | Wt 200.0 lb

## 2023-05-03 DIAGNOSIS — E66813 Obesity, class 3: Secondary | ICD-10-CM

## 2023-05-03 DIAGNOSIS — J449 Chronic obstructive pulmonary disease, unspecified: Secondary | ICD-10-CM | POA: Diagnosis not present

## 2023-05-03 DIAGNOSIS — R911 Solitary pulmonary nodule: Secondary | ICD-10-CM

## 2023-05-03 NOTE — Progress Notes (Signed)
Subjective:    Patient ID: Tammy [name], female    DOB: [birth year], 72 y.o.   MRN: [id number]  Patient Care Team: Cleatis Polka., MD as PCP - General (Internal Medicine) Rollene Rotunda, MD as PCP - Cardiology (Cardiology) Glory Buff, RN as Oncology Nurse Navigator Salena Saner, MD as Consulting Physician (Pulmonary Disease)  Chief Complaint  Patient presents with   Follow-up    No SOB. Little wheezing. No cough.    HPI  Discussed the use of AI scribe software for clinical note transcription with the patient, who gave verbal consent to proceed.  History of Present Illness   The patient, with a known diagnosis of moderate COPD, presented for a follow-up visit of COPD and known pulmonary nodule. She reported good adherence to her prescribed Trelegy regimen, which she takes every morning and finds helpful. She also underwent a cardiac catheterization due to concerns of arterial blockage. However, the procedure revealed that the blockage was not severe, and the patient was started on a new medication, which she has yet to receive. The patient also mentioned a family history of a gene that affects cholesterol levels, necessitating the use of Repatha injections.  The patient reported no significant respiratory symptoms, with her breathing being satisfactory. She also mentioned an upcoming left knee surgery scheduled for the next month. We discussed her pulmonary function testing shows moderate to severe obstructive airways disease however there has been no significant change from her 20 April 2022 study.    [name] use of albuterol was infrequent, indicating good control of her COPD symptoms with the current treatment regimen.      Review of Systems A 10 point review of systems was performed and it is as noted above otherwise negative.   Patient Active Problem List   Diagnosis Date Noted   Unstable angina (HCC) 04/20/2023   Other chest pain 12/08/2022    COPD suggested by initial evaluation (HCC) 04/20/2022   Nodule of lower lobe of left lung 11/11/2021   Fatigue 12/30/2020   Elevated coronary artery calcium score 12/30/2020   Unilateral primary osteoarthritis, left knee 10/06/2020   Respiratory failure, acute (HCC) 06/28/2020   Acute diastolic CHF (congestive heart failure) (HCC) 06/28/2020   Atrial fibrillation with rapid ventricular response (HCC) 06/28/2020   Acquired thrombophilia (HCC)    Depression    Atrial fibrillation with RVR (HCC) 06/16/2020   Diabetes mellitus (HCC)    HTN (hypertension)    Sleep apnea    Obesity, Class III, BMI 40-49.9 (morbid obesity) (HCC)    Chronic venous insufficiency 12/30/2018   Varicose veins of both lower extremities with inflammation 12/30/2018   DJD (degenerative joint disease) 12/30/2018   Snoring 11/27/2018   Daytime sleepiness 11/27/2018   Educated about COVID-19 virus infection 11/27/2018   SOB (shortness of breath) 11/27/2018   Hyperlipidemia 05/20/2015   Knee pain 06/06/2012    Social History   Tobacco Use   Smoking status: Former    Current packs/day: 0.00    Average packs/day: 1 pack/day for 30.0 years (30.0 ttl pk-yrs)    Types: Cigarettes    Start date: 11/02/1978    Quit date: 11/01/2008    Years since quitting: 14.5   Smokeless tobacco: Never  Substance Use Topics   Alcohol use: No    Alcohol/week: 0.0 standard drinks of alcohol    Allergies  Allergen Reactions   Betadine [Povidone Iodine] Anaphylaxis   Contrast Media [Iodinated Contrast Media] Anaphylaxis   Iodine  Anaphylaxis   Metrizamide Anaphylaxis   Povidone-Iodine Anaphylaxis   Shellfish Allergy Anaphylaxis   Hydrocodone-Acetaminophen Nausea Only    Current Meds  Medication Sig   albuterol (VENTOLIN HFA) 108 (90 Base) MCG/ACT inhaler Inhale 2 puffs into the lungs every 6 (six) hours as needed.   Bempedoic Acid (NEXLETOL) 180 MG TABS Take 1 tablet (180 mg total) by mouth daily.   beta carotene w/minerals  (OCUVITE) tablet Take 1 tablet by mouth daily.   diltiazem (CARDIZEM CD) 240 MG 24 hr capsule Take 1 capsule (240 mg total) by mouth daily.   diphenhydrAMINE (BENADRYL) 50 MG tablet Take 1 tab night before and 1 tab  1/2 before ccta   docusate sodium (COLACE) 100 MG capsule Take 100 mg by mouth daily as needed for mild constipation.   DULoxetine (CYMBALTA) 60 MG capsule Take 60 mg by mouth daily.   EPINEPHrine 0.3 mg/0.3 mL IJ SOAJ injection Inject 0.3 mg into the muscle as needed for anaphylaxis.   Evolocumab (REPATHA SURECLICK) 140 MG/ML SOAJ Inject 140 mg into the skin every 14 (fourteen) days.   fesoterodine (TOVIAZ) 4 MG TB24 tablet Take 4 mg by mouth daily.   Fluticasone-Umeclidin-Vilant (TRELEGY ELLIPTA) 100-62.5-25 MCG/ACT AEPB Inhale 1 Dose into the lungs daily.   furosemide (LASIX) 20 MG tablet Take 1 tablet (20 mg total) by mouth 2 (two) times daily. (Patient taking differently: Take 20 mg by mouth daily.)   isosorbide mononitrate (IMDUR) 30 MG 24 hr tablet Take 1 tablet (30 mg total) by mouth 2 (two) times daily.   metFORMIN (GLUCOPHAGE) 1000 MG tablet Take 1,000 mg by mouth daily with breakfast.    potassium chloride SA (KLOR-CON M) 20 MEQ tablet Take two tablets by mouth x 1 dose for high potassium   predniSONE (DELTASONE) 50 MG tablet Take 1 tab night before and 1 tab morning of ccta   sotalol (BETAPACE) 80 MG tablet Take 1 tablet (80 mg total) by mouth every 12 (twelve) hours.   tirzepatide [facility]) 2.5 MG/0.5ML Pen Inject 10.5 mg into the skin once a week. tuesday   valACYclovir (VALTREX) 1000 MG tablet Take 1,000 mg by mouth daily.   valsartan (DIOVAN) 320 MG tablet Take 320 mg by mouth daily.   Vibegron (GEMTESA) 75 MG TABS Take 1 tablet by mouth daily.    Immunization History  Administered Date(s) Administered   Influenza-Unspecified 03/21/2021   PFIZER Comirnaty(Gray Top)Covid-19 Tri-Sucrose Vaccine 10/03/2019, 10/31/2019   PFIZER(Purple Top)SARS-COV-2 Vaccination  05/23/2021      Objective:     BP 124/84 (BP Location: Right Arm, Cuff Size: Normal)   Pulse 76   Temp 97.8 F (36.6 C)   Ht 5\' 1"  (1.549 m)   Wt 200 lb (90.7 kg)   SpO2 94%   BMI 37.79 kg/m   SpO2: 94 % O2 Device: None (Room air)  GENERAL: Morbidly obese woman, no acute distress, fully ambulatory.  No conversational dyspnea. HEAD: Normocephalic, atraumatic.  EYES: Pupils equal, round, reactive to light.  No scleral icterus.  MOUTH: No prosthesis, few chipped teeth.  Oral mucosa moist.  No thrush. NECK: Supple. No thyromegaly. Trachea midline. No JVD.  No adenopathy. PULMONARY: Good air entry bilaterally.  No adventitious sounds. CARDIOVASCULAR: S1 and S2. Regular rate and rhythm.  No rubs, murmurs or gallops heard.. ABDOMEN: Obese, otherwise benign. MUSCULOSKELETAL: No joint deformity, no clubbing, trace lower extremity edema.  NEUROLOGIC: Grossly nonfocal, gait slow.  Speech is fluent. SKIN: Intact,warm,dry.  Multiple varicosities lower extremities, mild stasis changes.  PSYCH: Mood and behavior normal.     Assessment & Plan:     ICD-10-CM   1. Stage 2 moderate COPD by GOLD classification (HCC)  J44.9     2. Nodule of lower lobe of left lung  R91.1    Chest CT December 2024    3. Obesity, Class III, BMI 40-49.9 (morbid obesity) (HCC)  E66.01      Assessment and Plan    Chronic Obstructive Pulmonary Disease (COPD) Moderate to severe. Patient reports improvement with Trelegy. No current respiratory distress or frequent use of rescue inhaler. -Continue Trelegy daily. -She is very well compensated. -CT follow-up scheduled for December 2024.   Lung nodule -Prior negative bronchoscopy -Follow-up CT December 2024   Hyperlipidemia Family history of genetic hyperlipidemia. Currently on Repatha injections. New oral medication to be added. -Continue Repatha injections. -Start new oral medication as prescribed by cardiologist.  Left Knee Surgery Scheduled for  May 18, 2023. No contraindications from a pulmonary standpoint. -Clearance for surgery provided.  General Health Maintenance -Flu shot to be administered by primary care provider. -Follow-up appointment in January 2025.       Gailen Shelter, MD Advanced Bronchoscopy PCCM [facility]    *This note was dictated using voice recognition software/Dragon.  Despite best efforts to proofread, errors can occur which can change the meaning. Any transcriptional errors that result from this process are unintentional and may not be fully corrected at the time of dictation.

## 2023-05-03 NOTE — Patient Instructions (Signed)
VISIT SUMMARY:  During your recent visit, we discussed your chronic obstructive pulmonary disease (COPD), high cholesterol levels, and upcoming knee surgery. You reported that your COPD symptoms are well-managed with your current medication, Trelegy. We also discussed your family history of high cholesterol and your treatment with Repatha injections. Lastly, we discussed your upcoming knee surgery and I provided clearance for the procedure from a lung health perspective.  YOUR PLAN:  -CHRONIC OBSTRUCTIVE PULMONARY DISEASE (COPD): COPD is a lung disease that makes it hard to breathe and is often caused by long-term exposure to lung irritants. You are managing well with your current medication, Trelegy. Continue taking Trelegy daily. A follow-up CT scan is scheduled for December 2024 to monitor your lung nodule.  -HIGH CHOLESTEROL: High cholesterol can lead to heart disease. You have a family history of genetic high cholesterol and are currently on Repatha injections. Continue with these injections and start the new oral medication as prescribed by your cardiologist.  -LEFT KNEE SURGERY: You have a knee surgery scheduled for May 18, 2023. I have provided clearance for this surgery from a lung health perspective.  INSTRUCTIONS:  You are due for a flu shot, which will be administered by your primary care provider. We will have a follow-up appointment in January 2025 to discuss your overall health and any concerns you may have.

## 2023-05-07 ENCOUNTER — Other Ambulatory Visit: Payer: Self-pay

## 2023-05-07 NOTE — Progress Notes (Signed)
COVID Vaccine Completed: yes  Date of COVID positive in last 90 days:  PCP - Martha Clan, MD Cardiologist - Rollene Rotunda, MD Pulmonologist- Luevenia Maxin, MD LOV 05/03/23  Pulmonary clearance by Dr. Zadie Rhine 05/03/23 in Epic  Chest x-ray - 12/29/22 Epic EKG - 04/24/23 Epic Stress Test - 12/15/22 Epic ECHO - 12/19/22 Epic Cardiac Cath - 04/24/23 Epic Pacemaker/ICD device last checked: Spinal Cord Stimulator:  Bowel Prep -   Sleep Study -  CPAP -   Fasting Blood Sugar -  Checks Blood Sugar _____ times a day  Last dose of GLP1 agonist-  N/A GLP1 instructions:  N/A   Last dose of SGLT-2 inhibitors-  N/A SGLT-2 instructions: N/A   Blood Thinner Instructions:   Eliquis Aspirin Instructions: Last Dose:  Activity level:  Can go up a flight of stairs and perform activities of daily living without stopping and without symptoms of chest pain or shortness of breath.  Able to exercise without symptoms  Unable to go up a flight of stairs without symptoms of     Anesthesia review: atrial fib, HTN, CHF, CAD, DM2, SOB, COPD, OSA  Patient denies shortness of breath, fever, cough and chest pain at PAT appointment  Patient verbalized understanding of instructions that were given to them at the PAT appointment. Patient was also instructed that they will need to review over the PAT instructions again at home before surgery.

## 2023-05-08 ENCOUNTER — Other Ambulatory Visit: Payer: Self-pay

## 2023-05-08 ENCOUNTER — Encounter (HOSPITAL_COMMUNITY): Payer: Self-pay

## 2023-05-08 ENCOUNTER — Encounter (HOSPITAL_COMMUNITY)
Admission: RE | Admit: 2023-05-08 | Discharge: 2023-05-08 | Disposition: A | Discharge status: Home / Self Care | Payer: Medicare Other | Source: Ambulatory Visit | Attending: Anesthesiology | Admitting: Anesthesiology

## 2023-05-08 DIAGNOSIS — Z01818 Encounter for other preprocedural examination: Secondary | ICD-10-CM

## 2023-05-08 DIAGNOSIS — E119 Type 2 diabetes mellitus without complications: Secondary | ICD-10-CM

## 2023-05-08 NOTE — Patient Instructions (Addendum)
SURGICAL WAITING ROOM VISITATION  Patients having surgery or a procedure may have no more than 2 support people in the waiting area - these visitors may rotate.    Children under the age of 57 must have an adult with them who is not the patient.  Due to an increase in RSV and influenza rates and associated hospitalizations, children ages 21 and under may not visit patients in [facility] hospitals.  If the patient needs to stay at the hospital during part of their recovery, the visitor guidelines for inpatient rooms apply. Pre-op nurse will coordinate an appropriate time for 1 support person to accompany patient in pre-op.  This support person may not rotate.    Please refer to the [facility] website for the visitor guidelines for Inpatients (after your surgery is over and you are in a regular room).    Your procedure is scheduled on: 05/18/23   Report to [facility] Main Entrance    Report to admitting at 6:00 AM   Call this number if you have problems the morning of surgery [phone]   Do not eat food :After Midnight.   After Midnight you may have the following liquids until 5:30 AM DAY OF SURGERY  Water Non-Citrus Juices (without pulp, NO RED-Apple, White grape, White cranberry) Black Coffee (NO MILK/CREAM OR CREAMERS, sugar ok)  Clear Tea (NO MILK/CREAM OR CREAMERS, sugar ok) regular and decaf                             Plain Jell-O (NO RED)                                           Fruit ices (not with fruit pulp, NO RED)                                     Popsicles (NO RED)                                                               Sports drinks like Gatorade (NO RED)    The day of surgery:  Drink ONE (1) Pre-Surgery G2 at 5:30 AM the morning of surgery. Drink in one sitting. Do not sip.  This drink was given to you during your hospital  pre-op appointment visit. Nothing else to drink after completing the  Pre-Surgery Clear Ensure.          If you have  questions, please contact your surgeon's office.   FOLLOW BOWEL PREP AND ANY ADDITIONAL PRE OP INSTRUCTIONS YOU RECEIVED FROM YOUR SURGEON'S OFFICE!!!     Oral Hygiene is also important to reduce your risk of infection.                                    Remember - BRUSH YOUR TEETH THE MORNING OF SURGERY WITH YOUR REGULAR TOOTHPASTE  DENTURES WILL BE REMOVED PRIOR TO SURGERY PLEASE DO NOT APPLY "Poly grip" OR ADHESIVES!!!  Stop all vitamins and herbal supplements 7 days before surgery.   Take these medicines the morning of surgery with A SIP OF WATER: Albuterol, Diltiazem, Duloxetine, Inhalers, Isosorbide, Sotalol   DO NOT TAKE ANY ORAL DIABETIC MEDICATIONS DAY OF YOUR SURGERY  How to Manage Your Diabetes Before and After Surgery  Why is it important to control my blood sugar before and after surgery? Improving blood sugar levels before and after surgery helps healing and can limit problems. A way of improving blood sugar control is eating a healthy diet by:  Eating less sugar and carbohydrates  Increasing activity/exercise  Talking with your doctor about reaching your blood sugar goals High blood sugars (greater than 180 mg/dL) can raise your risk of infections and slow your recovery, so you will need to focus on controlling your diabetes during the weeks before surgery. Make sure that the doctor who takes care of your diabetes knows about your planned surgery including the date and location.  How do I manage my blood sugar before surgery? Check your blood sugar at least 4 times a day, starting 2 days before surgery, to make sure that the level is not too high or low. Check your blood sugar the morning of your surgery when you wake up and every 2 hours until you get to the Short Stay unit. If your blood sugar is less than 70 mg/dL, you will need to treat for low blood sugar: Do not take insulin. Treat a low blood sugar (less than 70 mg/dL) with  cup of clear juice (cranberry or  apple), 4 glucose tablets, OR glucose gel. Recheck blood sugar in 15 minutes after treatment (to make sure it is greater than 70 mg/dL). If your blood sugar is not greater than 70 mg/dL on recheck, call [phone] for further instructions. Report your blood sugar to the short stay nurse when you get to Short Stay.  If you are admitted to the hospital after surgery: Your blood sugar will be checked by the staff and you will probably be given insulin after surgery (instead of oral diabetes medicines) to make sure you have good blood sugar levels. The goal for blood sugar control after surgery is 80-180 mg/dL.   WHAT DO I DO ABOUT MY DIABETES MEDICATION?  Do not take oral diabetes medicines (pills) the morning of surgery.  Hold Mounjaro 7 days prior. Do not take 05/15/23.  THE DAY BEFORE SURGERY, take Metformin as prescribed.     THE MORNING OF SURGERY, do not take Metformin  DO NOT TAKE THE FOLLOWING 7 DAYS PRIOR TO SURGERY: Ozempic, Wegovy, Rybelsus (Semaglutide), Byetta (exenatide), Bydureon (exenatide ER), Victoza, Saxenda (liraglutide), or Trulicity (dulaglutide) Mounjaro (Tirzepatide) Adlyxin (Lixisenatide), Polyethylene Glycol Loxenatide.  Reviewed and Endorsed by [facility] Patient Education Committee, August 2015  Bring CPAP mask and tubing day of surgery.                              You may not have any metal on your body including hair pins, jewelry, and body piercing             Do not wear make-up, lotions, powders, perfumes, or deodorant  Do not wear nail polish including gel and S&S, artificial/acrylic nails, or any other type of covering on natural nails including finger and toenails. If you have artificial nails, gel coating, etc. that needs to be removed by a nail salon please have this removed prior to surgery  or surgery may need to be canceled/ delayed if the surgeon/ anesthesia feels like they are unable to be safely monitored.   Do not shave  48 hours prior to  surgery   Do not bring valuables to the hospital. [facility] IS NOT             RESPONSIBLE   FOR VALUABLES.   Contacts, glasses, dentures or bridgework may not be worn into surgery.   Bring small overnight bag day of surgery.   DO NOT BRING YOUR HOME MEDICATIONS TO THE HOSPITAL. PHARMACY WILL DISPENSE MEDICATIONS LISTED ON YOUR MEDICATION LIST TO YOU DURING YOUR ADMISSION IN THE HOSPITAL!    Special Instructions: Bring a copy of your healthcare power of attorney and living will documents the day of surgery if you haven't scanned them before.              Please read over the following fact sheets you were given: IF YOU HAVE QUESTIONS ABOUT YOUR PRE-OP INSTRUCTIONS PLEASE CALL [phone] [name]    If you received a COVID test during your pre-op visit  it is requested that you wear a mask when out in public, stay away from anyone that may not be feeling well and notify your surgeon if you develop symptoms. If you test positive for Covid or have been in contact with anyone that has tested positive in the last 10 days please notify you surgeon.    Pre-operative 5 CHG Bath Instructions   You can play a key role in reducing the risk of infection after surgery. Your skin needs to be as free of germs as possible. You can reduce the number of germs on your skin by washing with CHG (chlorhexidine gluconate) soap before surgery. CHG is an antiseptic soap that kills germs and continues to kill germs even after washing.   DO NOT use if you have an allergy to chlorhexidine/CHG or antibacterial soaps. If your skin becomes reddened or irritated, stop using the CHG and notify one of our RNs at [phone].   Please shower with the CHG soap starting 4 days before surgery using the following schedule:     Please keep in mind the following:  DO NOT shave, including legs and underarms, starting the day of your first shower.   You may shave your face at any point before/day of surgery.  Place clean  sheets on your bed the day you start using CHG soap. Use a clean washcloth (not used since being washed) for each shower. DO NOT sleep with pets once you start using the CHG.   CHG Shower Instructions:  If you choose to wash your hair and private area, wash first with your normal shampoo/soap.  After you use shampoo/soap, rinse your hair and body thoroughly to remove shampoo/soap residue.  Turn the water OFF and apply about 3 tablespoons (45 ml) of CHG soap to a CLEAN washcloth.  Apply CHG soap ONLY FROM YOUR NECK DOWN TO YOUR TOES (washing for 3-5 minutes)  DO NOT use CHG soap on face, private areas, open wounds, or sores.  Pay special attention to the area where your surgery is being performed.  If you are having back surgery, having someone wash your back for you may be helpful. Wait 2 minutes after CHG soap is applied, then you may rinse off the CHG soap.  Pat dry with a clean towel  Put on clean clothes/pajamas   If you choose to wear lotion, please use ONLY the CHG-compatible  lotions on the back of this paper.     Additional instructions for the day of surgery: DO NOT APPLY any lotions, deodorants, cologne, or perfumes.   Put on clean/comfortable clothes.  Brush your teeth.  Ask your nurse before applying any prescription medications to the skin.      CHG Compatible Lotions   Aveeno Moisturizing lotion  Cetaphil Moisturizing Cream  Cetaphil Moisturizing Lotion  Clairol Herbal Essence Moisturizing Lotion, Dry Skin  Clairol Herbal Essence Moisturizing Lotion, Extra Dry Skin  Clairol Herbal Essence Moisturizing Lotion, Normal Skin  Curel Age Defying Therapeutic Moisturizing Lotion with Alpha Hydroxy  Curel Extreme Care Body Lotion  Curel Soothing Hands Moisturizing Hand Lotion  Curel Therapeutic Moisturizing Cream, Fragrance-Free  Curel Therapeutic Moisturizing Lotion, Fragrance-Free  Curel Therapeutic Moisturizing Lotion, Original Formula  Eucerin Daily Replenishing  Lotion  Eucerin Dry Skin Therapy Plus Alpha Hydroxy Crme  Eucerin Dry Skin Therapy Plus Alpha Hydroxy Lotion  Eucerin Original Crme  Eucerin Original Lotion  Eucerin Plus Crme Eucerin Plus Lotion  Eucerin TriLipid Replenishing Lotion  Keri Anti-Bacterial Hand Lotion  Keri Deep Conditioning Original Lotion Dry Skin Formula Softly Scented  Keri Deep Conditioning Original Lotion, Fragrance Free Sensitive Skin Formula  Keri Lotion Fast Absorbing Fragrance Free Sensitive Skin Formula  Keri Lotion Fast Absorbing Softly Scented Dry Skin Formula  Keri Original Lotion  Keri Skin Renewal Lotion Keri Silky Smooth Lotion  Keri Silky Smooth Sensitive Skin Lotion  Nivea Body Creamy Conditioning Oil  Nivea Body Extra Enriched Lotion  Nivea Body Original Lotion  Nivea Body Sheer Moisturizing Lotion Nivea Crme  Nivea Skin Firming Lotion  NutraDerm 30 Skin Lotion  NutraDerm Skin Lotion  NutraDerm Therapeutic Skin Cream  NutraDerm Therapeutic Skin Lotion  ProShield Protective Hand Cream  Provon moisturizing lotion       Incentive Spirometer  An incentive spirometer is a tool that can help keep your lungs clear and active. This tool measures how well you are filling your lungs with each breath. Taking long deep breaths may help reverse or decrease the chance of developing breathing (pulmonary) problems (especially infection) following: A long period of time when you are unable to move or be active. BEFORE THE PROCEDURE  If the spirometer includes an indicator to show your best effort, your nurse or respiratory therapist will set it to a desired goal. If possible, sit up straight or lean slightly forward. Try not to slouch. Hold the incentive spirometer in an upright position. INSTRUCTIONS FOR USE  Sit on the edge of your bed if possible, or sit up as far as you can in bed or on a chair. Hold the incentive spirometer in an upright position. Breathe out normally. Place the mouthpiece in your  mouth and seal your lips tightly around it. Breathe in slowly and as deeply as possible, raising the piston or the ball toward the top of the column. Hold your breath for 3-5 seconds or for as long as possible. Allow the piston or ball to fall to the bottom of the column. Remove the mouthpiece from your mouth and breathe out normally. Rest for a few seconds and repeat Steps 1 through 7 at least 10 times every 1-2 hours when you are awake. Take your time and take a few normal breaths between deep breaths. The spirometer may include an indicator to show your best effort. Use the indicator as a goal to work toward during each repetition. After each set of 10 deep breaths, practice coughing to be sure  your lungs are clear. If you have an incision (the cut made at the time of surgery), support your incision when coughing by placing a pillow or rolled up towels firmly against it. Once you are able to get out of bed, walk around indoors and cough well. You may stop using the incentive spirometer when instructed by your caregiver.  RISKS AND COMPLICATIONS Take your time so you do not get dizzy or light-headed. If you are in pain, you may need to take or ask for pain medication before doing incentive spirometry. It is harder to take a deep breath if you are having pain. AFTER USE Rest and breathe slowly and easily. It can be helpful to keep track of a log of your progress. Your caregiver can provide you with a simple table to help with this. If you are using the spirometer at home, follow these instructions: SEEK MEDICAL CARE IF:  You are having difficultly using the spirometer. You have trouble using the spirometer as often as instructed. Your pain medication is not giving enough relief while using the spirometer. You develop fever of 100.5 F (38.1 C) or higher. SEEK IMMEDIATE MEDICAL CARE IF:  You cough up bloody sputum that had not been present before. You develop fever of 102 F (38.9 C) or  greater. You develop worsening pain at or near the incision site. MAKE SURE YOU:  Understand these instructions. Will watch your condition. Will get help right away if you are not doing well or get worse. Document Released: 11/13/2006 Document Revised: 09/25/2011 Document Reviewed: 01/14/2007 [facility] Patient Information 2014 [place], [place].   ________________________________________________________________________

## 2023-05-09 ENCOUNTER — Encounter (HOSPITAL_COMMUNITY)
Admission: RE | Admit: 2023-05-09 | Discharge: 2023-05-09 | Disposition: A | Discharge status: Home / Self Care | Payer: Medicare Other | Source: Ambulatory Visit | Attending: Orthopaedic Surgery | Admitting: Orthopaedic Surgery

## 2023-05-09 DIAGNOSIS — I4891 Unspecified atrial fibrillation: Secondary | ICD-10-CM | POA: Insufficient documentation

## 2023-05-09 DIAGNOSIS — G4733 Obstructive sleep apnea (adult) (pediatric): Secondary | ICD-10-CM | POA: Insufficient documentation

## 2023-05-09 DIAGNOSIS — M1712 Unilateral primary osteoarthritis, left knee: Secondary | ICD-10-CM | POA: Diagnosis not present

## 2023-05-09 DIAGNOSIS — Z87891 Personal history of nicotine dependence: Secondary | ICD-10-CM | POA: Diagnosis not present

## 2023-05-09 DIAGNOSIS — Z7984 Long term (current) use of oral hypoglycemic drugs: Secondary | ICD-10-CM | POA: Insufficient documentation

## 2023-05-09 DIAGNOSIS — Z01818 Encounter for other preprocedural examination: Secondary | ICD-10-CM

## 2023-05-09 DIAGNOSIS — I2510 Atherosclerotic heart disease of native coronary artery without angina pectoris: Secondary | ICD-10-CM | POA: Insufficient documentation

## 2023-05-09 DIAGNOSIS — Z01812 Encounter for preprocedural laboratory examination: Secondary | ICD-10-CM | POA: Diagnosis not present

## 2023-05-09 DIAGNOSIS — I509 Heart failure, unspecified: Secondary | ICD-10-CM | POA: Diagnosis not present

## 2023-05-09 DIAGNOSIS — J449 Chronic obstructive pulmonary disease, unspecified: Secondary | ICD-10-CM | POA: Diagnosis not present

## 2023-05-09 DIAGNOSIS — E785 Hyperlipidemia, unspecified: Secondary | ICD-10-CM | POA: Diagnosis not present

## 2023-05-09 DIAGNOSIS — E119 Type 2 diabetes mellitus without complications: Secondary | ICD-10-CM | POA: Insufficient documentation

## 2023-05-09 DIAGNOSIS — I110 Hypertensive heart disease with heart failure: Secondary | ICD-10-CM | POA: Diagnosis not present

## 2023-05-09 LAB — COMPREHENSIVE METABOLIC PANEL
ALT: 12 U/L (ref 0–44)
AST: 13 U/L — ABNORMAL LOW (ref 15–41)
Albumin: 3.5 g/dL (ref 3.5–5.0)
Alkaline Phosphatase: 58 U/L (ref 38–126)
Anion gap: 7 (ref 5–15)
BUN: 15 mg/dL (ref 8–23)
CO2: 30 mmol/L (ref 22–32)
Calcium: 9.4 mg/dL (ref 8.9–10.3)
Chloride: 102 mmol/L (ref 98–111)
Creatinine, Ser: 0.77 mg/dL (ref 0.44–1.00)
GFR, Estimated: >60 mL/min (ref >60)
Glucose, Bld: 103 mg/dL — ABNORMAL HIGH (ref 70–99)
Potassium: 4 mmol/L (ref 3.5–5.1)
Sodium: 139 mmol/L (ref 135–145)
Total Bilirubin: 1.1 mg/dL (ref 0.3–1.2)
Total Protein: 7.1 g/dL (ref 6.5–8.1)

## 2023-05-09 LAB — SURGICAL PCR SCREEN
MRSA, PCR: NEGATIVE
Staphylococcus aureus: NEGATIVE

## 2023-05-09 LAB — HEMOGLOBIN A1C
Hgb A1c MFr Bld: 5.7 % — ABNORMAL HIGH (ref 4.8–5.6)
Mean Plasma Glucose: 116.89 mg/dL

## 2023-05-10 NOTE — Progress Notes (Signed)
DISCUSSION: Tammy [name] is a 72 yo female who is being evaluated prior to L TKA on 05/18/23 with Dr. Magnus Ivan. 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  Prior anesthesia complications include PONV  Patient has followed with Cardiology for A. fib and HLD with statin intolerance. She started to have chest pain in May/June. Ultimately underwent cardiac cath on 04/24/23 which showed mild non obstructive CAD. Ongoing medical management was recommended. She had a follow up visit with Cardiology on 10/15. Noted to be doing well. Advised f/u in 3 months.  Pt follows with Pulmonology for COPD. Recently had PFTs which showed moderate COPD. She is a former smoking. On inhalers. Last seen on 10/17. Per Dr. Jayme Cloud: "The patient reported no significant respiratory symptoms, with her breathing being satisfactory. She also mentioned an upcoming left knee surgery scheduled for the next month. We discussed her pulmonary function testing shows moderate to severe obstructive airways disease however there has been no significant change from her 20 April 2022 study."  Eliquis will be held for 3 days  VS: BP 125/75 (BP Location: Right Arm)   Pulse 82   Temp 36.4 C (Oral)   Resp 16   Ht 5' (1.524 m)   Wt 87.1 kg   SpO2 98%   BMI 37.50 kg/m   PROVIDERS: Cleatis Polka., MD Cardiology: Adrian Blackwater, MD Pulmonology: Sarina Ser, MD  LABS: Labs reviewed: Acceptable for surgery. (all labs ordered are listed, but only abnormal results are displayed)  Labs Reviewed  HEMOGLOBIN A1C - Abnormal; Notable for the following components:      Result Value   Hgb A1c MFr Bld 5.7 (*)    All other components within normal limits  COMPREHENSIVE METABOLIC PANEL - Abnormal; Notable for the following components:   Glucose, Bld 103 (*)    AST 13 (*)    All other components within normal limits  SURGICAL PCR  SCREEN     IMAGES:  CTA Chest 04/10/23:  Findings: Visualized portions of the lung fields demonstrate no lung nodule, mass, consolidation or effusion Visulaized portions of the upper abdomen are unremarkable. No lytic or blastic lesions    EKG 04/24/23  Sinus rhythm, rate 75 Low voltage, precordial leads Abnormal R-wave progression, early transition   CV:  LHC 04/24/23:    Prox LAD to Mid LAD lesion is 45% stenosed.   The left ventricular systolic function is normal.   LV end diastolic pressure is normal.   The left ventricular ejection fraction is 55-65% by visual estimate.   There is no aortic valve stenosis.   Cardiac catheterization was done without complication.  Mid LAD had 45 percent lesion.  No significant disease in left circumflex or RCA.  Echocardiogram prior had normal ejection fraction.  Advise medical therapy.  Echo 12/19/22:   ASSESSMENT  Technically adequate study.  Normal chamber sizes.  Normal left ventricular systolic function.  Mild left ventricular hypertrophy with GRADE 1(relaxation abnormality)  diastolic dysfunction.  Normal right ventricular systolic function.  Normal right ventricular diastolic function.  Normal left ventricular wall motion.  Normal right ventricular wall motion.  Trace tricuspid regurgitation.  Normal pulmonary artery pressure.  Trace mitral regurgitation.  No pericardial effusion.  Moderately dilated Left atrium  No LVH   Past Medical History:  Diagnosis Date   Aortic atherosclerosis (HCC)    Atrial fibrillation (HCC)    a.) CHA2DS2VASc = 5 (  age, CHF, HTN, vascular disease history, T2DM);  b.) rate/rhythm maintained on oral diltiazem + sotolol; chronically anticoagulated with apixaban   CAD (coronary artery disease)    a.) cCTA 08/21/2014: Ca2+ score 873 (99th percentile for age/sex/race matched control)   CHF (congestive heart failure) (HCC)    a.) TTE 06/17/2020: EF 50-55%, LV dil, mild LVH, mild RVE, mod BAE,  G1DD; b.) TTE 12/19/2022: EF >55%, mod LAE, triv TR/PR, G1DD   Complication of anesthesia    COPD (chronic obstructive pulmonary disease) (HCC)    DJD (degenerative joint disease)    Encephalitis    Fibromyalgia    History of kidney stones    HTN (hypertension)    Hyperlipidemia    Long term current use of anticoagulant    a.) apixaban   OAB (overactive bladder)    a.) on vibegron + fesoterodine   OSA on CPAP    PONV (postoperative nausea and vomiting)    T2DM (type 2 diabetes mellitus) (HCC)     Past Surgical History:  Procedure Laterality Date   APPENDECTOMY     BACK SURGERY  2022   BREAST CYST EXCISION     BREAST EXCISIONAL BIOPSY Left 2004   COLONOSCOPY WITH PROPOFOL N/A 11/26/2020   Procedure: COLONOSCOPY WITH PROPOFOL;  Surgeon: Jeani Hawking, MD;  Location: [facility];  Service: Endoscopy;  Laterality: N/A;   COLONOSCOPY WITH PROPOFOL N/A 02/16/2023   Procedure: COLONOSCOPY WITH PROPOFOL;  Surgeon: Jeani Hawking, MD;  Location: [facility];  Service: Gastroenterology;  Laterality: N/A;   DIAGNOSTIC LAPAROSCOPY     HEMOSTASIS CLIP PLACEMENT  11/26/2020   Procedure: HEMOSTASIS CLIP PLACEMENT;  Surgeon: Jeani Hawking, MD;  Location: [facility];  Service: Endoscopy;;   KNEE ARTHROSCOPY     LEFT HEART CATH AND CORONARY ANGIOGRAPHY N/A 04/24/2023   Procedure: LEFT HEART CATH AND CORONARY ANGIOGRAPHY;  Surgeon: Laurier Nancy, MD;  Location: [facility] INVASIVE CV [facility];  Service: Cardiovascular;  Laterality: N/A;   POLYPECTOMY  11/26/2020   Procedure: POLYPECTOMY;  Surgeon: Jeani Hawking, MD;  Location: [facility];  Service: Endoscopy;;   POLYPECTOMY  02/16/2023   Procedure: POLYPECTOMY;  Surgeon: Jeani Hawking, MD;  Location: [facility];  Service: Gastroenterology;;   SUBMUCOSAL TATTOO INJECTION  11/26/2020   Procedure: SUBMUCOSAL TATTOO INJECTION;  Surgeon: Jeani Hawking, MD;  Location: [facility];  Service: Endoscopy;;   TEE WITHOUT CARDIOVERSION N/A 06/29/2020    Procedure: TRANSESOPHAGEAL ECHOCARDIOGRAM (TEE) with DCCV;  Surgeon: Laurier Nancy, MD;  Location: [facility] ORS;  Service: Cardiovascular;  Laterality: N/A;    MEDICATIONS:  albuterol (VENTOLIN HFA) 108 (90 Base) MCG/ACT inhaler   apixaban (ELIQUIS) 5 MG TABS tablet   Bempedoic Acid (NEXLETOL) 180 MG TABS   beta carotene w/minerals (OCUVITE) tablet   diltiazem (CARDIZEM CD) 240 MG 24 hr capsule   DULoxetine (CYMBALTA) 60 MG capsule   EPINEPHrine 0.3 mg/0.3 mL IJ SOAJ injection   Evolocumab (REPATHA SURECLICK) 140 MG/ML SOAJ   fesoterodine (TOVIAZ) 4 MG TB24 tablet   Fluticasone-Umeclidin-Vilant (TRELEGY ELLIPTA) 100-62.5-25 MCG/ACT AEPB   furosemide (LASIX) 20 MG tablet   isosorbide mononitrate (IMDUR) 30 MG 24 hr tablet   metFORMIN (GLUCOPHAGE) 500 MG tablet   sotalol (BETAPACE) 80 MG tablet   tirzepatide (MOUNJARO) 10 MG/0.5ML Pen   valACYclovir (VALTREX) 1000 MG tablet   valsartan (DIOVAN) 320 MG tablet   Vibegron (GEMTESA) 75 MG TABS   No current facility-administered medications for this encounter.    sodium chloride flush (NS) 0.9 % injection 3  mL    Marcille Blanco [facility] Short Stay/Anesthesiology [facility] Phone [phone] 05/10/2023 9:54 AM

## 2023-05-10 NOTE — Anesthesia Preprocedure Evaluation (Addendum)
Anesthesia Evaluation  Patient identified by MRN, date of birth, ID band Patient awake    Reviewed: Allergy & Precautions, NPO status , Patient's Chart, lab work & pertinent test results  History of Anesthesia Complications (+) PONV and history of anesthetic complications  Airway Mallampati: II  TM Distance: >3 FB Neck ROM: Full    Dental  (+) Teeth Intact, Dental Advisory Given   Pulmonary sleep apnea , COPD,  COPD inhaler, former smoker   breath sounds clear to auscultation       Cardiovascular hypertension, + CAD and +CHF   Rhythm:Regular Rate:Normal     Neuro/Psych  PSYCHIATRIC DISORDERS  Depression     Neuromuscular disease    GI/Hepatic negative GI ROS, Neg liver ROS,,,  Endo/Other  diabetes, Type 2, Oral Hypoglycemic Agents    Renal/GU negative Renal ROS     Musculoskeletal  (+) Arthritis ,  Fibromyalgia -  Abdominal   Peds  Hematology negative hematology ROS (+)   Anesthesia Other Findings   Reproductive/Obstetrics                             Anesthesia Physical Anesthesia Plan  ASA: 3  Anesthesia Plan: Spinal   Post-op Pain Management: Regional block*   Induction: Intravenous  PONV Risk Score and Plan: 4 or greater and Dexamethasone, Ondansetron, Propofol infusion and Midazolam  Airway Management Planned:   Additional Equipment: None  Intra-op Plan:   Post-operative Plan:   Informed Consent: I have reviewed the patients History and Physical, chart, labs and discussed the procedure including the risks, benefits and alternatives for the proposed anesthesia with the patient or authorized representative who has indicated his/her understanding and acceptance.       Plan Discussed with: CRNA  Anesthesia Plan Comments: (See PAT note from 10/23 by Sherlie Ban PA-C  Due to h/o back surgery, patient also consented for GA with LMA   Lab Results      Component                 Value               Date                      WBC                      8.4                 04/20/2023                HGB                      12.8                04/20/2023                HCT                      41.1                04/20/2023                MCV                      90  04/20/2023                PLT                      288                 04/20/2023            )        Anesthesia Quick Evaluation

## 2023-05-11 DIAGNOSIS — M170 Bilateral primary osteoarthritis of knee: Secondary | ICD-10-CM | POA: Diagnosis not present

## 2023-05-15 DIAGNOSIS — L821 Other seborrheic keratosis: Secondary | ICD-10-CM | POA: Diagnosis not present

## 2023-05-15 DIAGNOSIS — L814 Other melanin hyperpigmentation: Secondary | ICD-10-CM | POA: Diagnosis not present

## 2023-05-15 DIAGNOSIS — D225 Melanocytic nevi of trunk: Secondary | ICD-10-CM | POA: Diagnosis not present

## 2023-05-15 DIAGNOSIS — L853 Xerosis cutis: Secondary | ICD-10-CM | POA: Diagnosis not present

## 2023-05-17 ENCOUNTER — Telehealth: Payer: Self-pay | Admitting: *Deleted

## 2023-05-17 NOTE — Care Plan (Signed)
[facility] RNCM call to patient to review her upcoming Left total knee arthroplasty with Dr. Magnus Ivan on 05/18/23 at [facility]. She is agreeable to case management. She expects to discharge home after surgery with care provided by her husband. She has a RW, 3in1/BSC, elevated toilets, hand rails and a shower seat in bathroom. No DME needed. Anticipate HHPT will be needed. Referral made to [facility] after choice provided. Reviewed all post op care instructions and questions answered. Will continue to follow for needs.

## 2023-05-17 NOTE — H&P (Signed)
TOTAL KNEE ADMISSION H&P  Patient is being admitted for left total knee arthroplasty.  Subjective:  Chief Complaint:left knee pain.  HPI: Tammy [name], 72 y.o. female, has a history of pain and functional disability in the left knee due to arthritis and has failed non-surgical conservative treatments for greater than 12 weeks to includeNSAID's and/or analgesics, corticosteriod injections, viscosupplementation injections, flexibility and strengthening excercises, use of assistive devices, and activity modification.  Onset of symptoms was gradual, starting 5 years ago with gradually worsening course since that time. The patient noted prior procedures on the knee to include  arthroscopy on the left knee(s).  Patient currently rates pain in the left knee(s) at 10 out of 10 with activity. Patient has night pain, worsening of pain with activity and weight bearing, pain that interferes with activities of daily living, pain with passive range of motion, crepitus, and joint swelling.  Patient has severe left knee arthritis by imaging studies. . There is no active infection.  Patient Active Problem List   Diagnosis Date Noted   Unstable angina (HCC) 04/20/2023   Other chest pain 12/08/2022   COPD suggested by initial evaluation (HCC) 04/20/2022   Nodule of lower lobe of left lung 11/11/2021   Fatigue 12/30/2020   Elevated coronary artery calcium score 12/30/2020   Unilateral primary osteoarthritis, left knee 10/06/2020   Respiratory failure, acute (HCC) 06/28/2020   Acute diastolic CHF (congestive heart failure) (HCC) 06/28/2020   Atrial fibrillation with rapid ventricular response (HCC) 06/28/2020   Acquired thrombophilia (HCC)    Depression    Atrial fibrillation with RVR (HCC) 06/16/2020   Diabetes mellitus (HCC)    HTN (hypertension)    Sleep apnea    Obesity, Class III, BMI 40-49.9 (morbid obesity) (HCC)    Chronic venous insufficiency 12/30/2018   Varicose veins of both lower  extremities with inflammation 12/30/2018   DJD (degenerative joint disease) 12/30/2018   Snoring 11/27/2018   Daytime sleepiness 11/27/2018   Educated about COVID-19 virus infection 11/27/2018   SOB (shortness of breath) 11/27/2018   Hyperlipidemia 05/20/2015   Knee pain 06/06/2012   Past Medical History:  Diagnosis Date   Aortic atherosclerosis (HCC)    Atrial fibrillation (HCC)    a.) CHA2DS2VASc = 5 (age, CHF, HTN, vascular disease history, T2DM);  b.) rate/rhythm maintained on oral diltiazem + sotolol; chronically anticoagulated with apixaban   CAD (coronary artery disease)    a.) cCTA 08/21/2014: Ca2+ score 873 (99th percentile for age/sex/race matched control)   CHF (congestive heart failure) (HCC)    a.) TTE 06/17/2020: EF 50-55%, LV dil, mild LVH, mild RVE, mod BAE, G1DD; b.) TTE 12/19/2022: EF >55%, mod LAE, triv TR/PR, G1DD   Complication of anesthesia    COPD (chronic obstructive pulmonary disease) (HCC)    DJD (degenerative joint disease)    Encephalitis    Fibromyalgia    History of kidney stones    HTN (hypertension)    Hyperlipidemia    Long term current use of anticoagulant    a.) apixaban   OAB (overactive bladder)    a.) on vibegron + fesoterodine   OSA on CPAP    PONV (postoperative nausea and vomiting)    T2DM (type 2 diabetes mellitus) (HCC)     Past Surgical History:  Procedure Laterality Date   APPENDECTOMY     BACK SURGERY  2022   BREAST CYST EXCISION     BREAST EXCISIONAL BIOPSY Left 2004   COLONOSCOPY WITH PROPOFOL N/A 11/26/2020  Procedure: COLONOSCOPY WITH PROPOFOL;  Surgeon: Jeani Hawking, MD;  Location: [facility];  Service: Endoscopy;  Laterality: N/A;   COLONOSCOPY WITH PROPOFOL N/A 02/16/2023   Procedure: COLONOSCOPY WITH PROPOFOL;  Surgeon: Jeani Hawking, MD;  Location: [facility];  Service: Gastroenterology;  Laterality: N/A;   DIAGNOSTIC LAPAROSCOPY     HEMOSTASIS CLIP PLACEMENT  11/26/2020   Procedure: HEMOSTASIS CLIP PLACEMENT;   Surgeon: Jeani Hawking, MD;  Location: [facility];  Service: Endoscopy;;   KNEE ARTHROSCOPY     LEFT HEART CATH AND CORONARY ANGIOGRAPHY N/A 04/24/2023   Procedure: LEFT HEART CATH AND CORONARY ANGIOGRAPHY;  Surgeon: Laurier Nancy, MD;  Location: [facility];  Service: Cardiovascular;  Laterality: N/A;   POLYPECTOMY  11/26/2020   Procedure: POLYPECTOMY;  Surgeon: Jeani Hawking, MD;  Location: [facility];  Service: Endoscopy;;   POLYPECTOMY  02/16/2023   Procedure: POLYPECTOMY;  Surgeon: Jeani Hawking, MD;  Location: [facility];  Service: Gastroenterology;;   SUBMUCOSAL TATTOO INJECTION  11/26/2020   Procedure: SUBMUCOSAL TATTOO INJECTION;  Surgeon: Jeani Hawking, MD;  Location: [facility];  Service: Endoscopy;;   TEE WITHOUT CARDIOVERSION N/A 06/29/2020   Procedure: TRANSESOPHAGEAL ECHOCARDIOGRAM (TEE) with DCCV;  Surgeon: Laurier Nancy, MD;  Location: [facility];  Service: Cardiovascular;  Laterality: N/A;    No current facility-administered medications for this encounter.   Current Outpatient Medications  Medication Sig Dispense Refill Last Dose   albuterol (VENTOLIN HFA) 108 (90 Base) MCG/ACT inhaler Inhale 2 puffs into the lungs every 6 (six) hours as needed. 8 g 2    apixaban (ELIQUIS) 5 MG TABS tablet Take 5 mg by mouth 2 (two) times daily.      diltiazem (CARDIZEM CD) 240 MG 24 hr capsule Take 1 capsule (240 mg total) by mouth daily. 90 capsule 0    DULoxetine (CYMBALTA) 60 MG capsule Take 60 mg by mouth daily.      EPINEPHrine 0.3 mg/0.3 mL IJ SOAJ injection Inject 0.3 mg into the muscle as needed for anaphylaxis.      Evolocumab (REPATHA SURECLICK) 140 MG/ML SOAJ Inject 140 mg into the skin every 14 (fourteen) days.      fesoterodine (TOVIAZ) 4 MG TB24 tablet Take 4 mg by mouth daily.      Fluticasone-Umeclidin-Vilant (TRELEGY ELLIPTA) 100-62.5-25 MCG/ACT AEPB Inhale 1 Dose into the lungs daily. 180 each 1    furosemide (LASIX) 20 MG tablet Take 1 tablet (20 mg  total) by mouth 2 (two) times daily. (Patient taking differently: Take 20 mg by mouth daily.) 30 tablet 1    isosorbide mononitrate (IMDUR) 30 MG 24 hr tablet Take 1 tablet (30 mg total) by mouth 2 (two) times daily. (Patient taking differently: Take 30 mg by mouth daily.) 60 tablet 3    metFORMIN (GLUCOPHAGE) 500 MG tablet Take 500 mg by mouth 2 (two) times daily.      sotalol (BETAPACE) 80 MG tablet Take 1 tablet (80 mg total) by mouth every 12 (twelve) hours. 60 tablet 0    tirzepatide (MOUNJARO) 10 MG/0.5ML Pen Inject 10 mg into the skin every Tuesday.      valACYclovir (VALTREX) 1000 MG tablet Take 1,000 mg by mouth daily.      valsartan (DIOVAN) 320 MG tablet Take 320 mg by mouth daily.      Vibegron (GEMTESA) 75 MG TABS Take 1 tablet by mouth daily.      Bempedoic Acid (NEXLETOL) 180 MG TABS Take 1 tablet (180 mg total) by mouth daily. 30  tablet 3    beta carotene w/minerals (OCUVITE) tablet Take 1 tablet by mouth daily.      Facility-Administered Medications Ordered in Other Encounters  Medication Dose Route Frequency Provider Last Rate Last Admin   sodium chloride flush (NS) 0.9 % injection 3 mL  3 mL Intravenous Q12H Laurier Nancy, MD       Allergies  Allergen Reactions   Betadine [Povidone Iodine] Anaphylaxis   Contrast Media [Iodinated Contrast Media] Anaphylaxis   Iodine Anaphylaxis   Metrizamide Anaphylaxis   Povidone-Iodine Anaphylaxis   Shellfish Allergy Anaphylaxis   Hydrocodone-Acetaminophen Nausea Only    Social History   Tobacco Use   Smoking status: Former    Current packs/day: 0.00    Average packs/day: 1 pack/day for 30.0 years (30.0 ttl pk-yrs)    Types: Cigarettes    Start date: 11/02/1978    Quit date: 11/01/2008    Years since quitting: 14.5   Smokeless tobacco: Never  Substance Use Topics   Alcohol use: No    Alcohol/week: 0.0 standard drinks of alcohol    Family History  Problem Relation Age of Onset   CAD Mother 61   Diabetes Father    Heart  disease Father    CAD Brother 52   Throat cancer Paternal Uncle    Stomach cancer Maternal Grandfather    Breast cancer Cousin      Review of Systems  Objective:  Physical Exam Vitals reviewed.  Constitutional:      Appearance: Normal appearance. She is normal weight.  HENT:     Head: Normocephalic and atraumatic.  Eyes:     Extraocular Movements: Extraocular movements intact.     Pupils: Pupils are equal, round, and reactive to light.  Cardiovascular:     Rate and Rhythm: Normal rate. Rhythm irregular.     Pulses: Normal pulses.  Pulmonary:     Effort: Pulmonary effort is normal.     Breath sounds: Normal breath sounds.  Abdominal:     Palpations: Abdomen is soft.  Musculoskeletal:     Cervical back: Normal range of motion.     Left knee: Effusion, bony tenderness and crepitus present. Decreased range of motion. Tenderness present over the medial joint line and lateral joint line.  Neurological:     Mental Status: She is alert and oriented to person, place, and time.  Psychiatric:        Behavior: Behavior normal.     Vital signs in last 24 hours:    Labs:   Estimated body mass index is 37.5 kg/m as calculated from the following:   Height as of 05/09/23: 5' (1.524 m).   Weight as of 05/09/23: 87.1 kg.   Imaging Review Plain radiographs demonstrate severe degenerative joint disease of the left knee(s). The overall alignment isneutral. The bone quality appears to be good for age and reported activity level.      Assessment/Plan:  End stage arthritis, left knee   The patient history, physical examination, clinical judgment of the provider and imaging studies are consistent with end stage degenerative joint disease of the left knee(s) and total knee arthroplasty is deemed medically necessary. The treatment options including medical management, injection therapy arthroscopy and arthroplasty were discussed at length. The risks and benefits of total knee  arthroplasty were presented and reviewed. The risks due to aseptic loosening, infection, stiffness, patella tracking problems, thromboembolic complications and other imponderables were discussed. The patient acknowledged the explanation, agreed to proceed with the plan and consent  was signed. Patient is being admitted for inpatient treatment for surgery, pain control, PT, OT, prophylactic antibiotics, VTE prophylaxis, progressive ambulation and ADL's and discharge planning. The patient is planning to be discharged home with home health services

## 2023-05-17 NOTE — Telephone Encounter (Signed)
Ortho bundle pre-op call completed. 

## 2023-05-18 ENCOUNTER — Encounter (HOSPITAL_COMMUNITY)
Admission: RE | Disposition: A | Discharge status: Home Health | Payer: Self-pay | Source: Ambulatory Visit | Attending: Orthopaedic Surgery

## 2023-05-18 ENCOUNTER — Encounter (HOSPITAL_COMMUNITY): Payer: Self-pay | Admitting: Orthopaedic Surgery

## 2023-05-18 ENCOUNTER — Observation Stay (HOSPITAL_COMMUNITY): Payer: Medicare Other

## 2023-05-18 ENCOUNTER — Other Ambulatory Visit: Payer: Self-pay

## 2023-05-18 ENCOUNTER — Observation Stay (HOSPITAL_COMMUNITY)
Admission: RE | Admit: 2023-05-18 | Discharge: 2023-05-19 | Disposition: A | Discharge status: Home Health | Payer: Medicare Other | Source: Ambulatory Visit | Attending: Orthopaedic Surgery | Admitting: Orthopaedic Surgery

## 2023-05-18 ENCOUNTER — Ambulatory Visit (HOSPITAL_COMMUNITY): Payer: Medicare Other | Admitting: Medical

## 2023-05-18 ENCOUNTER — Ambulatory Visit (HOSPITAL_BASED_OUTPATIENT_CLINIC_OR_DEPARTMENT_OTHER): Payer: Medicare Other | Admitting: Anesthesiology

## 2023-05-18 DIAGNOSIS — I200 Unstable angina: Secondary | ICD-10-CM

## 2023-05-18 DIAGNOSIS — Z471 Aftercare following joint replacement surgery: Secondary | ICD-10-CM | POA: Diagnosis not present

## 2023-05-18 DIAGNOSIS — I110 Hypertensive heart disease with heart failure: Secondary | ICD-10-CM | POA: Insufficient documentation

## 2023-05-18 DIAGNOSIS — M1712 Unilateral primary osteoarthritis, left knee: Secondary | ICD-10-CM

## 2023-05-18 DIAGNOSIS — I4891 Unspecified atrial fibrillation: Secondary | ICD-10-CM | POA: Diagnosis not present

## 2023-05-18 DIAGNOSIS — J449 Chronic obstructive pulmonary disease, unspecified: Secondary | ICD-10-CM | POA: Diagnosis not present

## 2023-05-18 DIAGNOSIS — I509 Heart failure, unspecified: Secondary | ICD-10-CM | POA: Diagnosis not present

## 2023-05-18 DIAGNOSIS — I5031 Acute diastolic (congestive) heart failure: Secondary | ICD-10-CM | POA: Insufficient documentation

## 2023-05-18 DIAGNOSIS — Z96652 Presence of left artificial knee joint: Secondary | ICD-10-CM | POA: Diagnosis not present

## 2023-05-18 DIAGNOSIS — Z87891 Personal history of nicotine dependence: Secondary | ICD-10-CM | POA: Insufficient documentation

## 2023-05-18 DIAGNOSIS — Z79899 Other long term (current) drug therapy: Secondary | ICD-10-CM | POA: Diagnosis not present

## 2023-05-18 DIAGNOSIS — E119 Type 2 diabetes mellitus without complications: Secondary | ICD-10-CM | POA: Insufficient documentation

## 2023-05-18 DIAGNOSIS — Z7984 Long term (current) use of oral hypoglycemic drugs: Secondary | ICD-10-CM | POA: Insufficient documentation

## 2023-05-18 DIAGNOSIS — I2510 Atherosclerotic heart disease of native coronary artery without angina pectoris: Secondary | ICD-10-CM

## 2023-05-18 DIAGNOSIS — Z7901 Long term (current) use of anticoagulants: Secondary | ICD-10-CM | POA: Insufficient documentation

## 2023-05-18 DIAGNOSIS — G8918 Other acute postprocedural pain: Secondary | ICD-10-CM | POA: Diagnosis not present

## 2023-05-18 DIAGNOSIS — R609 Edema, unspecified: Secondary | ICD-10-CM | POA: Diagnosis not present

## 2023-05-18 HISTORY — PX: TOTAL KNEE ARTHROPLASTY: SHX125

## 2023-05-18 LAB — GLUCOSE, CAPILLARY
Glucose-Capillary: 91 mg/dL (ref 70–99)
Glucose-Capillary: 106 mg/dL — ABNORMAL HIGH (ref 70–99)
Glucose-Capillary: 165 mg/dL — ABNORMAL HIGH (ref 70–99)
Glucose-Capillary: 130 mg/dL — ABNORMAL HIGH (ref 70–99)

## 2023-05-18 LAB — CBC
HCT: 38.4 % (ref 36.0–46.0)
Hemoglobin: 11.7 g/dL — ABNORMAL LOW (ref 12.0–15.0)
MCH: 28 pg (ref 26.0–34.0)
MCHC: 30.5 g/dL (ref 30.0–36.0)
MCV: 91.9 fL (ref 80.0–100.0)
Platelets: 231 10*3/uL (ref 150–400)
RBC: 4.18 MIL/uL (ref 3.87–5.11)
RDW: 16.5 % — ABNORMAL HIGH (ref 11.5–15.5)
WBC: 7.4 10*3/uL (ref 4.0–10.5)
nRBC: 0 % (ref 0.0–0.2)

## 2023-05-18 SURGERY — ARTHROPLASTY, KNEE, TOTAL
Anesthesia: Spinal | Site: Knee | Laterality: Left

## 2023-05-18 MED ORDER — LIDOCAINE HCL (CARDIAC) PF 100 MG/5ML IV SOSY
PREFILLED_SYRINGE | INTRAVENOUS | Status: DC | PRN
  Administered 2023-05-18: 40 mg via INTRAVENOUS

## 2023-05-18 MED ORDER — ACETAMINOPHEN 325 MG PO TABS
325.0000 mg | ORAL_TABLET | Freq: Once | ORAL | Status: DC | PRN
Start: 2023-05-18 — End: 2023-05-18

## 2023-05-18 MED ORDER — VALACYCLOVIR HCL 500 MG PO TABS
1000.0000 mg | ORAL_TABLET | Freq: Every day | ORAL | Status: DC
  Administered 2023-05-18 – 2023-05-19 (×2): 1000 mg via ORAL
  Filled 2023-05-18 (×2): qty 2

## 2023-05-18 MED ORDER — ISOSORBIDE MONONITRATE ER 30 MG PO TB24
30.0000 mg | ORAL_TABLET | Freq: Every day | ORAL | Status: DC
  Administered 2023-05-18 – 2023-05-19 (×2): 30 mg via ORAL
  Filled 2023-05-18 (×2): qty 1

## 2023-05-18 MED ORDER — FESOTERODINE FUMARATE ER 4 MG PO TB24
4.0000 mg | ORAL_TABLET | Freq: Every day | ORAL | Status: DC
  Administered 2023-05-18 – 2023-05-19 (×2): 4 mg via ORAL
  Filled 2023-05-18 (×2): qty 1

## 2023-05-18 MED ORDER — ONDANSETRON HCL 4 MG/2ML IJ SOLN
INTRAMUSCULAR | Status: AC
Start: 2023-05-18 — End: ?
  Filled 2023-05-18: qty 2

## 2023-05-18 MED ORDER — SODIUM CHLORIDE 0.9 % IV SOLN: INTRAVENOUS | Status: AC

## 2023-05-18 MED ORDER — PHENYLEPHRINE HCL-NACL 20-0.9 MG/250ML-% IV SOLN
INTRAVENOUS | Status: DC | PRN
  Administered 2023-05-18: 30 ug/min via INTRAVENOUS

## 2023-05-18 MED ORDER — DOCUSATE SODIUM 100 MG PO CAPS
100.0000 mg | ORAL_CAPSULE | Freq: Two times a day (BID) | ORAL | Status: DC
  Administered 2023-05-18 – 2023-05-19 (×2): 100 mg via ORAL
  Filled 2023-05-18 (×2): qty 1

## 2023-05-18 MED ORDER — PANTOPRAZOLE SODIUM 40 MG PO TBEC
40.0000 mg | DELAYED_RELEASE_TABLET | Freq: Every day | ORAL | Status: DC
  Administered 2023-05-18 – 2023-05-19 (×2): 40 mg via ORAL
  Filled 2023-05-18 (×2): qty 1

## 2023-05-18 MED ORDER — ROPIVACAINE HCL 5 MG/ML IJ SOLN
INTRAMUSCULAR | Status: DC | PRN
  Administered 2023-05-18: 30 mL via PERINEURAL

## 2023-05-18 MED ORDER — DEXAMETHASONE SODIUM PHOSPHATE 10 MG/ML IJ SOLN
INTRAMUSCULAR | Status: AC
  Filled 2023-05-18: qty 1

## 2023-05-18 MED ORDER — PHENOL 1.4 % MT LIQD: 1.0000 | OROMUCOSAL | Status: DC | PRN

## 2023-05-18 MED ORDER — HYDROMORPHONE HCL 1 MG/ML IJ SOLN
0.2500 mg | INTRAMUSCULAR | Status: DC | PRN
Start: 2023-05-18 — End: 2023-05-18

## 2023-05-18 MED ORDER — MENTHOL 3 MG MT LOZG: 1.0000 | LOZENGE | OROMUCOSAL | Status: DC | PRN

## 2023-05-18 MED ORDER — ONDANSETRON HCL 4 MG PO TABS: 4.0000 mg | ORAL_TABLET | Freq: Four times a day (QID) | ORAL | Status: DC | PRN

## 2023-05-18 MED ORDER — UMECLIDINIUM BROMIDE 62.5 MCG/ACT IN AEPB
1.0000 | INHALATION_SPRAY | Freq: Every day | RESPIRATORY_TRACT | Status: DC
  Administered 2023-05-19: 1 via RESPIRATORY_TRACT
  Filled 2023-05-18: qty 7

## 2023-05-18 MED ORDER — IRBESARTAN 150 MG PO TABS
300.0000 mg | ORAL_TABLET | Freq: Every day | ORAL | Status: DC
  Administered 2023-05-19: 300 mg via ORAL
  Filled 2023-05-18: qty 2

## 2023-05-18 MED ORDER — OXYCODONE HCL 5 MG PO TABS
5.0000 mg | ORAL_TABLET | ORAL | Status: DC | PRN
  Administered 2023-05-18: 10 mg via ORAL
  Filled 2023-05-18 (×2): qty 2

## 2023-05-18 MED ORDER — DULOXETINE HCL 60 MG PO CPEP
60.0000 mg | ORAL_CAPSULE | Freq: Every day | ORAL | Status: DC
  Administered 2023-05-18 – 2023-05-19 (×2): 60 mg via ORAL
  Filled 2023-05-18 (×2): qty 1

## 2023-05-18 MED ORDER — TRANEXAMIC ACID-NACL 1000-0.7 MG/100ML-% IV SOLN
1000.0000 mg | INTRAVENOUS | Status: AC
  Administered 2023-05-18: 1000 mg via INTRAVENOUS
  Filled 2023-05-18: qty 100

## 2023-05-18 MED ORDER — PROPOFOL 500 MG/50ML IV EMUL
INTRAVENOUS | Status: DC | PRN
  Administered 2023-05-18: 50 ug/kg/min via INTRAVENOUS

## 2023-05-18 MED ORDER — PROPOFOL 1000 MG/100ML IV EMUL
INTRAVENOUS | Status: AC
  Filled 2023-05-18: qty 100

## 2023-05-18 MED ORDER — GLYCOPYRROLATE 0.2 MG/ML IJ SOLN
INTRAMUSCULAR | Status: AC
  Filled 2023-05-18: qty 1

## 2023-05-18 MED ORDER — MEPERIDINE HCL 50 MG/ML IJ SOLN: 6.2500 mg | INTRAMUSCULAR | Status: DC | PRN

## 2023-05-18 MED ORDER — LACTATED RINGERS IV SOLN: INTRAVENOUS | Status: DC

## 2023-05-18 MED ORDER — DROPERIDOL 2.5 MG/ML IJ SOLN: 0.6250 mg | Freq: Once | INTRAMUSCULAR | Status: DC | PRN

## 2023-05-18 MED ORDER — HYDROMORPHONE HCL 1 MG/ML IJ SOLN: 0.5000 mg | INTRAMUSCULAR | Status: DC | PRN

## 2023-05-18 MED ORDER — MIRABEGRON ER 25 MG PO TB24
25.0000 mg | ORAL_TABLET | Freq: Every day | ORAL | Status: DC
  Administered 2023-05-18 – 2023-05-19 (×2): 25 mg via ORAL
  Filled 2023-05-18 (×2): qty 1

## 2023-05-18 MED ORDER — DEXMEDETOMIDINE HCL IN NACL 80 MCG/20ML IV SOLN
INTRAVENOUS | Status: DC | PRN
  Administered 2023-05-18 (×2): 4 ug via INTRAVENOUS

## 2023-05-18 MED ORDER — DIPHENHYDRAMINE HCL 12.5 MG/5ML PO ELIX: 12.5000 mg | ORAL_SOLUTION | ORAL | Status: DC | PRN

## 2023-05-18 MED ORDER — SOTALOL HCL 80 MG PO TABS
80.0000 mg | ORAL_TABLET | Freq: Two times a day (BID) | ORAL | Status: DC
Start: 2023-05-18 — End: 2023-05-19
  Administered 2023-05-18 – 2023-05-19 (×2): 80 mg via ORAL
  Filled 2023-05-18 (×2): qty 1

## 2023-05-18 MED ORDER — MIDAZOLAM HCL 2 MG/2ML IJ SOLN: 2.0000 mg | Freq: Once | INTRAMUSCULAR | Status: AC

## 2023-05-18 MED ORDER — 0.9 % SODIUM CHLORIDE (POUR BTL) OPTIME
TOPICAL | Status: DC | PRN
  Administered 2023-05-18: 1000 mL

## 2023-05-18 MED ORDER — SODIUM CHLORIDE 0.9 % IR SOLN
Status: DC | PRN
  Administered 2023-05-18: 1000 mL

## 2023-05-18 MED ORDER — ACETAMINOPHEN 325 MG PO TABS
325.0000 mg | ORAL_TABLET | Freq: Four times a day (QID) | ORAL | Status: DC | PRN
  Administered 2023-05-19 (×2): 650 mg via ORAL
  Filled 2023-05-18 (×2): qty 2

## 2023-05-18 MED ORDER — BUPIVACAINE-EPINEPHRINE 0.25% -1:200000 IJ SOLN
INTRAMUSCULAR | Status: AC
  Filled 2023-05-18: qty 1

## 2023-05-18 MED ORDER — ACETAMINOPHEN 10 MG/ML IV SOLN: 1000.0000 mg | Freq: Once | INTRAVENOUS | Status: DC | PRN

## 2023-05-18 MED ORDER — FENTANYL CITRATE PF 50 MCG/ML IJ SOSY
PREFILLED_SYRINGE | INTRAMUSCULAR | Status: AC
  Administered 2023-05-18: 25 ug via INTRAVENOUS
  Filled 2023-05-18: qty 2

## 2023-05-18 MED ORDER — CEFAZOLIN SODIUM-DEXTROSE 2-4 GM/100ML-% IV SOLN
2.0000 g | INTRAVENOUS | Status: AC
  Administered 2023-05-18: 2 g via INTRAVENOUS
  Filled 2023-05-18: qty 100

## 2023-05-18 MED ORDER — METFORMIN HCL 500 MG PO TABS
500.0000 mg | ORAL_TABLET | Freq: Two times a day (BID) | ORAL | Status: DC
  Administered 2023-05-18 – 2023-05-19 (×2): 500 mg via ORAL
  Filled 2023-05-18 (×2): qty 1

## 2023-05-18 MED ORDER — METHOCARBAMOL 500 MG PO TABS
500.0000 mg | ORAL_TABLET | Freq: Four times a day (QID) | ORAL | Status: DC | PRN
  Administered 2023-05-18 – 2023-05-19 (×3): 500 mg via ORAL
  Filled 2023-05-18 (×3): qty 1

## 2023-05-18 MED ORDER — BUPIVACAINE-EPINEPHRINE 0.25% -1:200000 IJ SOLN
INTRAMUSCULAR | Status: DC | PRN
  Administered 2023-05-18: 30 mL

## 2023-05-18 MED ORDER — OXYCODONE HCL 5 MG PO TABS
10.0000 mg | ORAL_TABLET | ORAL | Status: DC | PRN
  Administered 2023-05-18: 10 mg via ORAL
  Administered 2023-05-19 (×3): 15 mg via ORAL
  Filled 2023-05-18 (×3): qty 3

## 2023-05-18 MED ORDER — METOCLOPRAMIDE HCL 5 MG PO TABS: 5.0000 mg | ORAL_TABLET | Freq: Three times a day (TID) | ORAL | Status: DC | PRN

## 2023-05-18 MED ORDER — ACETAMINOPHEN 160 MG/5ML PO SOLN
325.0000 mg | Freq: Once | ORAL | Status: DC | PRN
Start: 2023-05-18 — End: 2023-05-18

## 2023-05-18 MED ORDER — BUPIVACAINE IN DEXTROSE 0.75-8.25 % IT SOLN
INTRATHECAL | Status: DC | PRN
  Administered 2023-05-18: 1.6 mL via INTRATHECAL

## 2023-05-18 MED ORDER — LACTATED RINGERS IV SOLN
INTRAVENOUS | Status: AC
Start: 2023-05-18 — End: 2023-05-18

## 2023-05-18 MED ORDER — INSULIN ASPART 100 UNIT/ML IJ SOLN: 0.0000 [IU] | INTRAMUSCULAR | Status: DC | PRN

## 2023-05-18 MED ORDER — FENTANYL CITRATE (PF) 100 MCG/2ML IJ SOLN
INTRAMUSCULAR | Status: AC
  Filled 2023-05-18: qty 2

## 2023-05-18 MED ORDER — CHLORHEXIDINE GLUCONATE 0.12 % MT SOLN
15.0000 mL | Freq: Once | OROMUCOSAL | Status: AC
  Administered 2023-05-18: 15 mL via OROMUCOSAL

## 2023-05-18 MED ORDER — FENTANYL CITRATE (PF) 100 MCG/2ML IJ SOLN
INTRAMUSCULAR | Status: DC | PRN
  Administered 2023-05-18 (×4): 25 ug via INTRAVENOUS

## 2023-05-18 MED ORDER — METOCLOPRAMIDE HCL 5 MG/ML IJ SOLN: 5.0000 mg | Freq: Three times a day (TID) | INTRAMUSCULAR | Status: DC | PRN

## 2023-05-18 MED ORDER — ALBUMIN HUMAN 5 % IV SOLN
INTRAVENOUS | Status: AC
  Filled 2023-05-18: qty 250

## 2023-05-18 MED ORDER — DEXAMETHASONE SODIUM PHOSPHATE 10 MG/ML IJ SOLN
INTRAMUSCULAR | Status: DC | PRN
  Administered 2023-05-18: 4 mg via INTRAVENOUS

## 2023-05-18 MED ORDER — APIXABAN 5 MG PO TABS
5.0000 mg | ORAL_TABLET | Freq: Two times a day (BID) | ORAL | Status: DC
  Administered 2023-05-19: 5 mg via ORAL
  Filled 2023-05-18: qty 1

## 2023-05-18 MED ORDER — FENTANYL CITRATE PF 50 MCG/ML IJ SOSY: 100.0000 ug | PREFILLED_SYRINGE | Freq: Once | INTRAMUSCULAR | Status: AC

## 2023-05-18 MED ORDER — DILTIAZEM HCL ER COATED BEADS 240 MG PO CP24
240.0000 mg | ORAL_CAPSULE | Freq: Every day | ORAL | Status: DC
  Administered 2023-05-18 – 2023-05-19 (×2): 240 mg via ORAL
  Filled 2023-05-18 (×2): qty 1

## 2023-05-18 MED ORDER — LIDOCAINE HCL (PF) 2 % IJ SOLN
INTRAMUSCULAR | Status: AC
  Filled 2023-05-18: qty 5

## 2023-05-18 MED ORDER — ALBUTEROL SULFATE HFA 108 (90 BASE) MCG/ACT IN AERS: 2.0000 | INHALATION_SPRAY | Freq: Four times a day (QID) | RESPIRATORY_TRACT | Status: DC | PRN

## 2023-05-18 MED ORDER — METHOCARBAMOL 1000 MG/10ML IJ SOLN: 500.0000 mg | Freq: Four times a day (QID) | INTRAMUSCULAR | Status: DC | PRN

## 2023-05-18 MED ORDER — ALBUTEROL SULFATE (2.5 MG/3ML) 0.083% IN NEBU: 2.5000 mg | INHALATION_SOLUTION | Freq: Four times a day (QID) | RESPIRATORY_TRACT | Status: DC | PRN

## 2023-05-18 MED ORDER — CEFAZOLIN SODIUM-DEXTROSE 1-4 GM/50ML-% IV SOLN
1.0000 g | Freq: Four times a day (QID) | INTRAVENOUS | Status: AC
  Administered 2023-05-18 (×2): 1 g via INTRAVENOUS
  Filled 2023-05-18 (×2): qty 50

## 2023-05-18 MED ORDER — ALBUMIN HUMAN 5 % IV SOLN: INTRAVENOUS | Status: DC | PRN

## 2023-05-18 MED ORDER — ONDANSETRON HCL 4 MG/2ML IJ SOLN: 4.0000 mg | Freq: Four times a day (QID) | INTRAMUSCULAR | Status: DC | PRN

## 2023-05-18 MED ORDER — MIDAZOLAM HCL 2 MG/2ML IJ SOLN
INTRAMUSCULAR | Status: AC
  Administered 2023-05-18: 1 mg via INTRAVENOUS
  Filled 2023-05-18: qty 2

## 2023-05-18 MED ORDER — PHENYLEPHRINE HCL-NACL 20-0.9 MG/250ML-% IV SOLN
INTRAVENOUS | Status: AC
  Filled 2023-05-18: qty 250

## 2023-05-18 MED ORDER — ALUM & MAG HYDROXIDE-SIMETH 200-200-20 MG/5ML PO SUSP
30.0000 mL | ORAL | Status: DC | PRN
Start: 2023-05-18 — End: 2023-05-19

## 2023-05-18 MED ORDER — ONDANSETRON HCL 4 MG/2ML IJ SOLN
INTRAMUSCULAR | Status: DC | PRN
  Administered 2023-05-18: 4 mg via INTRAVENOUS

## 2023-05-18 MED ORDER — ORAL CARE MOUTH RINSE: 15.0000 mL | Freq: Once | OROMUCOSAL | Status: AC

## 2023-05-18 MED ORDER — FUROSEMIDE 20 MG PO TABS
20.0000 mg | ORAL_TABLET | Freq: Every day | ORAL | Status: DC
Start: 2023-05-19 — End: 2023-05-19
  Administered 2023-05-19: 20 mg via ORAL
  Filled 2023-05-18: qty 1

## 2023-05-18 SURGICAL SUPPLY — 63 items
APL SKNCLS STERI-STRIP NONHPOA (GAUZE/BANDAGES/DRESSINGS)
BAG COUNTER SPONGE SURGICOUNT (BAG) IMPLANT
BAG SPEC THK2 15X12 ZIP CLS (MISCELLANEOUS) ×1
BAG SPNG CNTER NS LX DISP (BAG)
BAG ZIPLOCK 12X15 (MISCELLANEOUS) ×1 IMPLANT
BENZOIN TINCTURE PRP APPL 2/3 (GAUZE/BANDAGES/DRESSINGS) IMPLANT
BLADE SAG 18X100X1.27 (BLADE) ×1 IMPLANT
BLADE SAW SGTL 13.0X1.19X90.0M (BLADE) IMPLANT
BLADE SURG SZ10 CARB STEEL (BLADE) ×2 IMPLANT
BNDG CMPR 6 X 5 YARDS HK CLSR (GAUZE/BANDAGES/DRESSINGS) ×1
BNDG ELASTIC 6INX 5YD STR LF (GAUZE/BANDAGES/DRESSINGS) ×2 IMPLANT
BOWL SMART MIX CTS (DISPOSABLE) IMPLANT
CEMENT BONE R 1X40 (Cement) IMPLANT
COMP MED POLY AS PERS S6-7 12 (Joint) ×1 IMPLANT
COMP TIB PS KNEE D 0D LT (Joint) ×1 IMPLANT
COMPONENT MED PLY PERSS6-7 12 (Joint) IMPLANT
COMPONET TIB PS KNEE D 0D LT (Joint) IMPLANT
COOLER ICEMAN CLASSIC (MISCELLANEOUS) ×1 IMPLANT
COVER SURGICAL LIGHT HANDLE (MISCELLANEOUS) ×1 IMPLANT
CUFF TOURN SGL QUICK 34 (TOURNIQUET CUFF)
CUFF TRNQT CYL 34X4.125X (TOURNIQUET CUFF) ×1 IMPLANT
DRAPE INCISE IOBAN 66X45 STRL (DRAPES) ×1 IMPLANT
DRAPE U-SHAPE 47X51 STRL (DRAPES) ×1 IMPLANT
DURAPREP 26ML APPLICATOR (WOUND CARE) ×1 IMPLANT
ELECT BLADE TIP CTD 4 INCH (ELECTRODE) ×1 IMPLANT
ELECT REM PT RETURN 15FT ADLT (MISCELLANEOUS) ×1 IMPLANT
FEMUR CMT CR STD SZ 6 LT KNEE (Joint) ×1 IMPLANT
FEMUR CMTD CR STD SZ 6 LT KNEE (Joint) IMPLANT
GAUZE PAD ABD 8X10 STRL (GAUZE/BANDAGES/DRESSINGS) ×2 IMPLANT
GAUZE SPONGE 4X4 12PLY STRL (GAUZE/BANDAGES/DRESSINGS) ×1 IMPLANT
GAUZE XEROFORM 1X8 LF (GAUZE/BANDAGES/DRESSINGS) IMPLANT
GLOVE BIO SURGEON STRL SZ7.5 (GLOVE) ×1 IMPLANT
GLOVE BIOGEL PI IND STRL 8 (GLOVE) ×2 IMPLANT
GLOVE ECLIPSE 8.0 STRL XLNG CF (GLOVE) ×1 IMPLANT
GOWN STRL REUS W/ TWL XL LVL3 (GOWN DISPOSABLE) ×2 IMPLANT
GOWN STRL REUS W/TWL XL LVL3 (GOWN DISPOSABLE) ×2
HANDPIECE INTERPULSE COAX TIP (DISPOSABLE) ×1
HOLDER FOLEY CATH W/STRAP (MISCELLANEOUS) IMPLANT
IMMOBILIZER KNEE 20 (SOFTGOODS) ×1
IMMOBILIZER KNEE 20 THIGH 36 (SOFTGOODS) ×1 IMPLANT
KIT TURNOVER KIT A (KITS) IMPLANT
NS IRRIG 1000ML POUR BTL (IV SOLUTION) ×1 IMPLANT
PACK TOTAL KNEE CUSTOM (KITS) ×1 IMPLANT
PAD COLD SHLDR WRAP-ON (PAD) ×1 IMPLANT
PADDING CAST COTTON 6X4 STRL (CAST SUPPLIES) ×2 IMPLANT
PIN DRILL HDLS TROCAR 75 4PK (PIN) IMPLANT
PROTECTOR NERVE ULNAR (MISCELLANEOUS) ×1 IMPLANT
SCREW FEMALE HEX FIX 25X2.5 (ORTHOPEDIC DISPOSABLE SUPPLIES) IMPLANT
SET HNDPC FAN SPRY TIP SCT (DISPOSABLE) ×1 IMPLANT
SET PAD KNEE POSITIONER (MISCELLANEOUS) ×1 IMPLANT
SPIKE FLUID TRANSFER (MISCELLANEOUS) IMPLANT
STAPLER SKIN PROX WIDE 3.9 (STAPLE) IMPLANT
STEM POLY PAT PLY 29M KNEE (Knees) IMPLANT
STRIP CLOSURE SKIN 1/2X4 (GAUZE/BANDAGES/DRESSINGS) IMPLANT
SUT MNCRL AB 4-0 PS2 18 (SUTURE) IMPLANT
SUT VIC AB 0 CT1 27 (SUTURE) ×1
SUT VIC AB 0 CT1 27XBRD ANTBC (SUTURE) ×1 IMPLANT
SUT VIC AB 1 CT1 36 (SUTURE) ×2 IMPLANT
SUT VIC AB 2-0 CT1 27 (SUTURE) ×2
SUT VIC AB 2-0 CT1 TAPERPNT 27 (SUTURE) ×2 IMPLANT
TRAY FOLEY MTR SLVR 14FR STAT (SET/KITS/TRAYS/PACK) IMPLANT
TRAY FOLEY MTR SLVR 16FR STAT (SET/KITS/TRAYS/PACK) IMPLANT
WATER STERILE IRR 1000ML POUR (IV SOLUTION) ×2 IMPLANT

## 2023-05-18 NOTE — Transfer of Care (Signed)
Immediate Anesthesia Transfer of Care Note  Patient: Tammy [name]  Procedure(s) Performed: LEFT TOTAL KNEE ARTHROPLASTY (Left: Knee)  Patient Location: PACU  Anesthesia Type:Spinal  Level of Consciousness: awake, alert , oriented, and patient cooperative  Airway & Oxygen Therapy: Patient Spontanous Breathing and Patient connected to face mask oxygen  Post-op Assessment: Report given to RN and Post -op Vital signs reviewed and stable  Post vital signs: Reviewed and stable  Last Vitals:  Vitals Value Taken Time  BP 103/60 05/18/23 1040  Temp    Pulse 63 05/18/23 1042  Resp 12 05/18/23 1042  SpO2 100 % 05/18/23 1042  Vitals shown include unfiled device data.  Last Pain:  Vitals:   05/18/23 0820  TempSrc:   PainSc: 0-No pain         Complications: No notable events documented.

## 2023-05-18 NOTE — Discharge Instructions (Signed)

## 2023-05-18 NOTE — Anesthesia Procedure Notes (Signed)
Anesthesia Regional Block: Adductor canal block   Pre-Anesthetic Checklist: , timeout performed,  Correct Patient, Correct Site, Correct Laterality,  Correct Procedure, Correct Position, site marked,  Risks and benefits discussed,  Surgical consent,  Pre-op evaluation,  At surgeon's request and post-op pain management  Laterality: Left  Prep: chloraprep       Needles:  Injection technique: Single-shot  Needle Type: Echogenic Stimulator Needle     Needle Length: 9cm  Needle Gauge: 21     Additional Needles:   Procedures:,,,, ultrasound used (permanent image in chart),,    Narrative:  Start time: 05/18/2023 7:55 AM End time: 05/18/2023 8:00 AM Injection made incrementally with aspirations every 5 mL.  Performed by: Personally  Anesthesiologist: [name], MD  Additional Notes: Discussed risks and benefits of the nerve block in detail, including but not limited vascular injury, permanent nerve damage and infection.   Patient tolerated the procedure well. Local anesthetic introduced in an incremental fashion under minimal resistance after negative aspirations. No paresthesias were elicited. After completion of the procedure, no acute issues were identified and patient continued to be monitored by RN.

## 2023-05-18 NOTE — Anesthesia Procedure Notes (Addendum)
Spinal  Start time: 05/18/2023 8:51 AM End time: 05/18/2023 8:53 AM Reason for block: surgical anesthesia Staffing Performed: anesthesiologist  Anesthesiologist: [name], MD Performed by: [name], MD Authorized by: [name], MD   Preanesthetic Checklist Completed: patient identified, IV checked, site marked, risks and benefits discussed, surgical consent, monitors and equipment checked, pre-op evaluation and timeout performed Spinal Block Patient position: sitting Prep: DuraPrep and site prepped and draped Location: L2-3 Injection technique: single-shot Needle Needle type: Pencan  Needle gauge: 24 G Needle length: 10 cm Needle insertion depth: 10 cm Additional Notes Patient tolerated well. No immediate complications.  Functioning IV was confirmed and monitors were applied. Sterile prep and drape, including hand hygiene and sterile gloves were used. The patient was positioned and the back was prepped. The skin was anesthetized with lidocaine. Free flow of clear CSF was obtained prior to injecting local anesthetic into the CSF. The spinal needle aspirated freely following injection. The needle was carefully withdrawn. The patient tolerated the procedure well.

## 2023-05-18 NOTE — Interval H&P Note (Signed)
History and Physical Interval Note: The patient understands that she is here today for a left total knee replacement to treat her severe left knee arthritis.  There has been no acute or interval change in her medical status.  The risks and benefits of surgery been discussed in detail and informed consent has been obtained.  The left operative knee has been marked.  05/18/2023 7:05 AM  Tammy [name]  has presented today for surgery, with the diagnosis of osteoarthritis left knee.  The various methods of treatment have been discussed with the patient and family. After consideration of risks, benefits and other options for treatment, the patient has consented to  Procedure(s): LEFT TOTAL KNEE ARTHROPLASTY (Left) as a surgical intervention.  [name] history has been reviewed, patient examined, no change in status, stable for surgery.  I have reviewed the patient's chart and labs.  Questions were answered to the patient's satisfaction.     Kathryne Hitch

## 2023-05-18 NOTE — Evaluation (Signed)
Physical Therapy Evaluation Patient Details Name: Tammy [name] MRN: [id number] DOB: 11-02-1950 Today's Date: 05/18/2023  History of Present Illness  Pt is a 72 year old female s/p left TKA on 05/18/23.  PMHx significant for but not limited to: DM, HTN, CAD, afib, CHF, COPD, OSA  Clinical Impression  Pt is s/p TKA resulting in the deficits listed below (see PT Problem List).  Pt will benefit from acute skilled PT to increase their independence and safety with mobility to allow discharge.  Pt ambulated short distance in hallway and agreeable to remain in recliner end of session.  Pt anticipates returning home with spouse upon d/c.         If plan is discharge home, recommend the following: Help with stairs or ramp for entrance;Assist for transportation   Can travel by private vehicle        Equipment Recommendations None recommended by PT  Recommendations for Other Services       Functional Status Assessment Patient has had a recent decline in their functional status and demonstrates the ability to make significant improvements in function in a reasonable and predictable amount of time.     Precautions / Restrictions Precautions Precautions: Fall;Knee Required Braces or Orthoses: Knee Immobilizer - Left Restrictions Weight Bearing Restrictions: No LLE Weight Bearing: Weight bearing as tolerated      Mobility  Bed Mobility Overal bed mobility: Needs Assistance Bed Mobility: Supine to Sit     Supine to sit: Contact guard, HOB elevated, Used rails     General bed mobility comments: verbal cues for technique    Transfers Overall transfer level: Needs assistance Equipment used: Rolling walker (2 wheels) Transfers: Sit to/from Stand Sit to Stand: Contact guard assist           General transfer comment: verbal cues for UE and LE positioning for pain control    Ambulation/Gait Ambulation/Gait assistance: Contact guard assist Gait Distance (Feet): 50  Feet Assistive device: Rolling walker (2 wheels) Gait Pattern/deviations: Step-to pattern, Decreased stance time - left, Antalgic Gait velocity: decr     General Gait Details: verbal cues for sequence, RW positioning, step length, posture  Stairs            Wheelchair Mobility     Tilt Bed    Modified Rankin (Stroke Patients Only)       Balance                                             Pertinent Vitals/Pain Pain Assessment Pain Assessment: 0-10 Pain Score: 6  Pain Location: left knee Pain Descriptors / Indicators: Sore, Aching Pain Intervention(s): Monitored during session, Repositioned, Premedicated before session, Ice applied    Home Living Family/patient expects to be discharged to:: Private residence Living Arrangements: Spouse/significant other Available Help at Discharge: Family;Available 24 hours/day Type of Home: House Home Access: Stairs to enter Entrance Stairs-Rails: Right Entrance Stairs-Number of Steps: 3   Home Layout: Able to live on main level with bedroom/bathroom Home Equipment: Agricultural consultant (2 wheels)      Prior Function Prior Level of Function : Independent/Modified Independent                     Extremity/Trunk Assessment        Lower Extremity Assessment Lower Extremity Assessment: LLE deficits/detail LLE Deficits / Details: able to perform SLR  however utilized KI first time OOB, able to perform ankle pumps, ROM TBA       Communication   Communication Communication: No apparent difficulties  Cognition Arousal: Alert Behavior During Therapy: WFL for tasks assessed/performed Overall Cognitive Status: Within Functional Limits for tasks assessed                                          General Comments      Exercises     Assessment/Plan    PT Assessment Patient needs continued PT services  PT Problem List Decreased strength;Decreased range of motion;Decreased  mobility;Decreased knowledge of use of DME;Pain       PT Treatment Interventions Functional mobility training;Stair training;Gait training;DME instruction;Therapeutic activities;Therapeutic exercise;Patient/family education    PT Goals (Current goals can be found in the Care Plan section)  Acute Rehab PT Goals PT Goal Formulation: With patient Time For Goal Achievement: 05/25/23 Potential to Achieve Goals: Good    Frequency 7X/week     Co-evaluation               AM-PAC PT "6 Clicks" Mobility  Outcome Measure Help needed turning from your back to your side while in a flat bed without using bedrails?: A Little Help needed moving from lying on your back to sitting on the side of a flat bed without using bedrails?: A Little Help needed moving to and from a bed to a chair (including a wheelchair)?: A Little Help needed standing up from a chair using your arms (e.g., wheelchair or bedside chair)?: A Little Help needed to walk in hospital room?: A Little Help needed climbing 3-5 steps with a railing? : A Little 6 Click Score: 18    End of Session Equipment Utilized During Treatment: Gait belt;Left knee immobilizer Activity Tolerance: Patient tolerated treatment well Patient left: in chair;with call bell/phone within reach;with chair alarm set;with family/visitor present Nurse Communication: Mobility status PT Visit Diagnosis: Difficulty in walking, not elsewhere classified (R26.2)    Time: 1610-9604 PT Time Calculation (min) (ACUTE ONLY): 16 min   Charges:   PT Evaluation $PT Eval Low Complexity: 1 Low   PT General Charges $$ ACUTE PT VISIT: 1 Visit        Paulino Door, DPT Physical Therapist Acute Rehabilitation Services Office: [phone]   Janan Halter [place] 05/18/2023, 4:27 PM

## 2023-05-18 NOTE — Op Note (Signed)
Operative Note  Date of operation: 05/18/2023 Preoperative diagnosis: Left knee primary osteoarthritis Postoperative diagnosis: Same  Procedure: Left cemented total knee arthroplasty  Implants: Biomet/Zimmer persona knee system Implant Name Type Inv. Item Serial No. Manufacturer Lot No. LRB No. Used Action  CEMENT BONE R 1X40 - [id number] Cement CEMENT BONE R 1X40  ZIMMER RECON(ORTH,TRAU,BIO,SG) [id number] Left 2 Implanted  COMP MED POLY AS PERS S6-7 12 - [id number] Joint COMP MED POLY AS PERS S6-7 12  ZIMMER RECON(ORTH,TRAU,BIO,SG) [id number] Left 1 Implanted  STEM POLY PAT PLY 2M KNEE - [id number] Knees STEM POLY PAT PLY 2M KNEE  ZIMMER RECON(ORTH,TRAU,BIO,SG) [id number] Left 1 Implanted  FEMUR CMT CR STD SZ 6 LT KNEE - [id number] Joint FEMUR CMT CR STD SZ 6 LT KNEE  ZIMMER RECON(ORTH,TRAU,BIO,SG) [id number] Left 1 Implanted  COMP TIB PS KNEE D 0D LT - [id number] Joint COMP TIB PS KNEE D 0D LT  ZIMMER RECON(ORTH,TRAU,BIO,SG) [id number] Left 1 Implanted   Surgeon: Vanita Panda. Magnus Ivan, MD Assistant: Rexene Edison, PA-C  Anesthesia: #1 left lower extremity adductor canal block, #2 spinal, #3 local Tourniquet time: Less than 1 hour EBL: Less than 100 cc Antibiotics: IV Ancef Complications: None  Indications: The patient is a 72 year old female well-known to me.  She has debilitating arthritis involving both her knees and the left one is hurt her quite worse than the right knee.  She has tried and failed over a years worth of conservative treatment including activity modification, steroid and hyaluronic acid injections as well as quad strengthening exercises.  She is on blood thinning medication and cannot take anti-inflammatories.  At this point her left knee pain is daily and it is severe.  It is detrimentally affecting her mobility, her quality of life and her actives daily living to the point she wishes to proceed with a knee replacement on the left knee and we agree with this as well.  We did  discuss the risk of acute blood loss anemia, nerve vessel injury, fracture, infection, DVT, and implant failure.  We talked about her goals being hopefully decrease pain, improve mobility and improve quality of life.  Procedure description: After informed consent was obtained and the appropriate left knee was marked, the patient was brought to the operating room and set up on the operating table where spinal anesthesia was obtained.  She was then laid in a supine position on the operating room table.  A Foley catheter was placed and a nonsterile tractors placed around her upper left thigh.  Her left thigh, knee, leg and ankle were prepped and draped with ChloraPrep prep and sterile drapes including a sterile stockinette.  A timeout was called and she was identified as the correct patient and correct left knee.  An Esmarch was used to wrap out the left lower extremity and the tourniquet was inflated to 300 mm of pressure.  With the left knee extended we made a direct midline incision over the patella and carried this proximally distally.  Dissection was carried down to the knee joint and a medial parapatellar arthrotomy was made.  A large joint effusion was encountered.  With the knee in a flexed position we found significant cartilage wear throughout the knee.  We removed osteophytes from all 3 compartments as well as loose bodies.  Removed remnants of the ACL as well as medial lateral meniscus.  Using an extramedullary cutting guide we made our proximal tibia cut correction for varus and valgus and a 5 degree slope.  We made this cut  to take 2 mm off the low side.  The cut was made without difficulty.  We then used a intramedullary cutting guide for our femur through the notch of the femur setting this for a left knee at 5 degrees externally rotated and a 10 mm distal femoral cut.  We then brought the knee back down to full extension and we have achieved full extension with a 10 mm block.  We then went back to  the femur and put a femoral sizing guide based off the epicondylar axis.  Based off of this we chose a size 6 femur.  We put a 4-in-1 cutting block for a size 6 femur and made our anterior and posterior cuts followed our chamfer cuts.  We then back to the tibia and chose a size D left tibial tray for coverage over the tibial plateau setting the rotation over the tibial tubercle and the femur.  We then made our keel punch and drill hole off of this.  We then trialed our size D left tibia followed by our size 6 left CR standard femur.  We trialed up to a 12 mm thickness medial congruent polythene insert we are pleased with stability and range of motion without 12 mm insert.  We then made a patella cut and drilled 64-year-old for a size 29 patella button.  With all trial instrumentation of the knee again we put her through several cycles of range of motion and assess the stability and I felt good about the range of motion and stability.  All trial instrumentation was then removed and the knee was irrigated with normal saline solution.  Marcaine with epinephrine was then placed around the arthrotomy.  The cement was mixed in with the knee in a flexed position we cemented our Biomet Zimmer persona tibial tray for a left knee size D followed by cementing our size 6 left CR standard femur.  We made sure excess cement debris was removed and we placed our 12 mm thickness left medial congruent polythene insert and cemented our size 29 patella button.  We then held the knee compressed and fully extended to let the cement hardened.  Once it hardened we let the tourniquet down and hemostasis was obtained with electrocautery.  The arthrotomy was then closed with interrupted #1 Vicryl suture followed by 0 Vicryl close the deep tissue and 2-0 Vicryl to close the subcutaneous tissue.  The skin was closed with staples.  Well-padded sterile dressings applied.  The patient was taken recovery room.  Rexene Edison, PA-C did assist during the  entire case and beginning to end and his assistance was crucial medically necessary for soft tissue management and retraction, helping guide implant placement and a layered closure of the wound.

## 2023-05-18 NOTE — TOC Transition Note (Signed)
Transition of Care [facility]) - CM/SW Discharge Note   Patient Details  Name: Tammy [name] MRN: [id number] Date of Birth: 1951/03/11  [facility] CM/SW Contact:  Amada Jupiter, LCSW Phone Number: 05/18/2023, 2:27 PM   Clinical Narrative:     Met with pt who confirms she has needed DME in the home.  HHPT prearranged with [facility] via ortho MD office.  No further TOC needs.  Final next level of care: Home w Home Health Services Barriers to Discharge: No Barriers Identified   Patient Goals and CMS Choice      Discharge Placement                         Discharge Plan and Services Additional resources added to the After Visit Summary for                  DME Arranged: N/A DME Agency: NA                  Social Determinants of Health (SDOH) Interventions SDOH Screenings   Social Connections: Unknown (11/29/2021)   Received from [facility]  Tobacco Use: Medium Risk (05/18/2023)     Readmission Risk Interventions     No data to display

## 2023-05-18 NOTE — Anesthesia Postprocedure Evaluation (Signed)
Anesthesia Post Note  Patient: Tammy [name]  Procedure(s) Performed: LEFT TOTAL KNEE ARTHROPLASTY (Left: Knee)     Patient location during evaluation: PACU Anesthesia Type: Spinal Level of consciousness: oriented and awake and alert Pain management: pain level controlled Vital Signs Assessment: post-procedure vital signs reviewed and stable Respiratory status: spontaneous breathing, respiratory function stable and patient connected to nasal cannula oxygen Cardiovascular status: blood pressure returned to baseline and stable Postop Assessment: no headache, no backache and no apparent nausea or vomiting Anesthetic complications: no  No notable events documented.  Last Vitals:  Vitals:   05/18/23 1336 05/18/23 1340  BP: 121/60 121/60  Pulse:  66  Resp:    Temp:    SpO2:  99%    Last Pain:  Vitals:   05/18/23 1300  TempSrc:   PainSc: 0-No pain                 [name]

## 2023-05-19 DIAGNOSIS — J449 Chronic obstructive pulmonary disease, unspecified: Secondary | ICD-10-CM | POA: Diagnosis not present

## 2023-05-19 DIAGNOSIS — M1712 Unilateral primary osteoarthritis, left knee: Secondary | ICD-10-CM | POA: Diagnosis not present

## 2023-05-19 DIAGNOSIS — I4891 Unspecified atrial fibrillation: Secondary | ICD-10-CM | POA: Diagnosis not present

## 2023-05-19 DIAGNOSIS — I2510 Atherosclerotic heart disease of native coronary artery without angina pectoris: Secondary | ICD-10-CM | POA: Diagnosis not present

## 2023-05-19 DIAGNOSIS — E119 Type 2 diabetes mellitus without complications: Secondary | ICD-10-CM | POA: Diagnosis not present

## 2023-05-19 DIAGNOSIS — I110 Hypertensive heart disease with heart failure: Secondary | ICD-10-CM | POA: Diagnosis not present

## 2023-05-19 LAB — CBC
HCT: 38.5 % (ref 36.0–46.0)
Hemoglobin: 12 g/dL (ref 12.0–15.0)
MCH: 28.2 pg (ref 26.0–34.0)
MCHC: 31.2 g/dL (ref 30.0–36.0)
MCV: 90.6 fL (ref 80.0–100.0)
Platelets: 222 10*3/uL (ref 150–400)
RBC: 4.25 MIL/uL (ref 3.87–5.11)
RDW: 16.5 % — ABNORMAL HIGH (ref 11.5–15.5)
WBC: 13.8 10*3/uL — ABNORMAL HIGH (ref 4.0–10.5)
nRBC: 0 % (ref 0.0–0.2)

## 2023-05-19 LAB — BASIC METABOLIC PANEL
Anion gap: 7 (ref 5–15)
BUN: 16 mg/dL (ref 8–23)
CO2: 27 mmol/L (ref 22–32)
Calcium: 9.1 mg/dL (ref 8.9–10.3)
Chloride: 101 mmol/L (ref 98–111)
Creatinine, Ser: 0.67 mg/dL (ref 0.44–1.00)
GFR, Estimated: >60 mL/min (ref >60)
Glucose, Bld: 129 mg/dL — ABNORMAL HIGH (ref 70–99)
Potassium: 4.1 mmol/L (ref 3.5–5.1)
Sodium: 135 mmol/L (ref 135–145)

## 2023-05-19 MED ORDER — OXYCODONE HCL 5 MG PO TABS: 5.0000 mg | ORAL_TABLET | Freq: Four times a day (QID) | ORAL | 0 refills | Status: DC | PRN

## 2023-05-19 MED ORDER — CHLORHEXIDINE GLUCONATE CLOTH 2 % EX PADS
6.0000 | MEDICATED_PAD | Freq: Every day | CUTANEOUS | Status: DC
  Administered 2023-05-19: 6 via TOPICAL

## 2023-05-19 MED ORDER — TIZANIDINE HCL 2 MG PO TABS: 2.0000 mg | ORAL_TABLET | Freq: Four times a day (QID) | ORAL | 0 refills | Status: DC | PRN

## 2023-05-19 NOTE — Plan of Care (Signed)
  Problem: Coping: Goal: Level of anxiety will decrease Outcome: Progressing   Problem: Pain Management: Goal: General experience of comfort will improve Outcome: Progressing   Problem: Safety: Goal: Ability to remain free from injury will improve Outcome: Progressing

## 2023-05-19 NOTE — Progress Notes (Signed)
Subjective: 1 Day Post-Op Procedure(s) (LRB): LEFT TOTAL KNEE ARTHROPLASTY (Left) Patient reports pain as moderate.    Objective: Vital signs in last 24 hours: Temp:  [97.5 F (36.4 C)-98.2 F (36.8 C)] 97.7 F (36.5 C) (11/02 0540) Pulse Rate:  [60-83] 83 (11/02 0540) Resp:  [11-19] 19 (11/02 0540) BP: (111-168)/(55-83) 168/83 (11/02 0540) SpO2:  [92 %-100 %] 97 % (11/02 0911) FiO2 (%):  [21 %] 21 % (11/02 0018)  Intake/Output from previous day: 11/01 0701 - 11/02 0700 In: 2139 [P.O.:240; I.V.:1349; IV Piggyback:550] Out: 1025 [Urine:950; Blood:75] Intake/Output this shift: Total I/O In: 240 [P.O.:240] Out: -   Recent Labs    05/18/23 1050 05/19/23 0315  HGB 11.7* 12.0   Recent Labs    05/18/23 1050 05/19/23 0315  WBC 7.4 13.8*  RBC 4.18 4.25  HCT 38.4 38.5  PLT 231 222   Recent Labs    05/19/23 0315  NA 135  K 4.1  CL 101  CO2 27  BUN 16  CREATININE 0.67  GLUCOSE 129*  CALCIUM 9.1   No results for input(s): "LABPT", "INR" in the last 72 hours.  Sensation intact distally Intact pulses distally Dorsiflexion/Plantar flexion intact Compartment soft  Assessment/Plan: 1 Day Post-Op Procedure(s) (LRB): LEFT TOTAL KNEE ARTHROPLASTY (Left) Up with therapy Discharge home with home health this afternoon if clears PT.      Kathryne Hitch 05/19/2023, 11:09 AM

## 2023-05-19 NOTE — Progress Notes (Signed)
Physical Therapy Treatment Patient Details Name: Tammy [name] MRN: [id number] DOB: 05-04-1951 Today's Date: 05/19/2023   History of Present Illness Pt is a 72 year old female s/p left TKA on 05/18/23.  PMHx significant for but not limited to: DM, HTN, CAD, afib, CHF, COPD, OSA    PT Comments  Pt reports premedication about an hour prior to therapist's arrival.  Pt ambulated in hallway however limited distance due to increased pain.  Pt also felt unable to practice steps or perform exercises at this time.  Pt in recliner with ice machine for left knee end of session.     If plan is discharge home, recommend the following: Help with stairs or ramp for entrance;Assist for transportation   Can travel by private vehicle        Equipment Recommendations  None recommended by PT    Recommendations for Other Services       Precautions / Restrictions Precautions Precautions: Fall;Knee Restrictions LLE Weight Bearing: Weight bearing as tolerated     Mobility  Bed Mobility Overal bed mobility: Needs Assistance Bed Mobility: Supine to Sit     Supine to sit: Min assist     General bed mobility comments: verbal cues for technique, assist for Lt LE due to pain    Transfers Overall transfer level: Needs assistance Equipment used: Rolling walker (2 wheels) Transfers: Sit to/from Stand Sit to Stand: Min assist           General transfer comment: verbal cues for UE and LE positioning for pain control, assist to rise    Ambulation/Gait Ambulation/Gait assistance: Contact guard assist Gait Distance (Feet): 26 Feet Assistive device: Rolling walker (2 wheels) Gait Pattern/deviations: Step-to pattern, Decreased stance time - left, Antalgic Gait velocity: decr     General Gait Details: verbal cues for sequence, RW positioning, step length, posture; distance to tolerance   Stairs             Wheelchair Mobility     Tilt Bed    Modified Rankin (Stroke  Patients Only)       Balance                                            Cognition Arousal: Alert Behavior During Therapy: WFL for tasks assessed/performed Overall Cognitive Status: Within Functional Limits for tasks assessed                                          Exercises      General Comments        Pertinent Vitals/Pain Pain Assessment Pain Assessment: 0-10 Pain Score: 8  Pain Location: left knee Pain Descriptors / Indicators: Sore, Aching Pain Intervention(s): Repositioned, Premedicated before session, Ice applied, Monitored during session    Home Living                          Prior Function            PT Goals (current goals can now be found in the care plan section) Progress towards PT goals: Progressing toward goals    Frequency    7X/week      PT Plan      Co-evaluation  AM-PAC PT "6 Clicks" Mobility   Outcome Measure  Help needed turning from your back to your side while in a flat bed without using bedrails?: A Little Help needed moving from lying on your back to sitting on the side of a flat bed without using bedrails?: A Little Help needed moving to and from a bed to a chair (including a wheelchair)?: A Little Help needed standing up from a chair using your arms (e.g., wheelchair or bedside chair)?: A Little Help needed to walk in hospital room?: A Little Help needed climbing 3-5 steps with a railing? : A Little 6 Click Score: 18    End of Session Equipment Utilized During Treatment: Gait belt Activity Tolerance: Patient tolerated treatment well Patient left: in chair;with call bell/phone within reach;with chair alarm set;with family/visitor present   PT Visit Diagnosis: Difficulty in walking, not elsewhere classified (R26.2)     Time: 5284-1324 PT Time Calculation (min) (ACUTE ONLY): 12 min  Charges:    $Gait Training: 8-22 mins PT General Charges $$ ACUTE PT  VISIT: 1 Visit                     {Kati PT, DPT Physical Therapist Acute Rehabilitation Services Office: [phone]    Kati L Payson 05/19/2023, 1:30 PM

## 2023-05-19 NOTE — Progress Notes (Signed)
   05/19/23 0018  BiPAP/CPAP/SIPAP  $ Face Mask Small Yes  BiPAP/CPAP/SIPAP Pt Type Adult  BiPAP/CPAP/SIPAP Resmed  Mask Type Full face mask  FiO2 (%) 21 %  Patient Home Equipment Yes (machine and tubing only)  Auto Titrate Yes

## 2023-05-19 NOTE — Progress Notes (Signed)
Physical Therapy Treatment Patient Details Name: Tammy [name] MRN: [id number] DOB: 11-Nov-1950 Today's Date: 05/19/2023   History of Present Illness Pt is a 72 year old female s/p left TKA on 05/18/23.  PMHx significant for but not limited to: DM, HTN, CAD, afib, CHF, COPD, OSA    PT Comments  Pt ambulated in hallway, practiced safe stair technique and performed LE exercises.  Pt reports pain still present but improved to 5/10 this afternoon.  Pt feels ready to d/c home today.  Spouse present during session, and pt and spouse provided with gait belt and HEP handout.     If plan is discharge home, recommend the following: Help with stairs or ramp for entrance;Assist for transportation   Can travel by private vehicle        Equipment Recommendations  None recommended by PT    Recommendations for Other Services       Precautions / Restrictions Precautions Precautions: Fall;Knee Restrictions LLE Weight Bearing: Weight bearing as tolerated     Mobility  Bed Mobility Overal bed mobility: Needs Assistance Bed Mobility: Supine to Sit     Supine to sit: Min assist     General bed mobility comments: pt in recliner    Transfers Overall transfer level: Needs assistance Equipment used: Rolling walker (2 wheels) Transfers: Sit to/from Stand Sit to Stand: Contact guard assist           General transfer comment: verbal cues for UE and LE positioning for pain control    Ambulation/Gait Ambulation/Gait assistance: Contact guard assist Gait Distance (Feet): 50 Feet Assistive device: Rolling walker (2 wheels) Gait Pattern/deviations: Step-to pattern, Decreased stance time - left, Antalgic Gait velocity: decr     General Gait Details: verbal cues for sequence, RW positioning, step length, posture; distance to tolerance   Stairs Stairs: Yes Stairs assistance: Contact guard assist Stair Management: Forwards, Step to pattern, Two rails Number of Stairs: 3 General  stair comments: verbal cues for safety and sequence; pt reports she has 2 posts she can reach so utilized rails simulate posts; pt performed twice and did not require physical assist; pt reports understanding   Wheelchair Mobility     Tilt Bed    Modified Rankin (Stroke Patients Only)       Balance                                            Cognition Arousal: Alert Behavior During Therapy: WFL for tasks assessed/performed Overall Cognitive Status: Within Functional Limits for tasks assessed                                          Exercises Total Joint Exercises Ankle Circles/Pumps: AROM, Both, 10 reps Quad Sets: AROM, 10 reps, Both Heel Slides: AAROM, Left, 10 reps Hip ABduction/ADduction: AAROM, Left, 10 reps Straight Leg Raises: AAROM, 10 reps, Left Knee Flexion: AROM, Left, Seated, 10 reps    General Comments        Pertinent Vitals/Pain Pain Assessment Pain Assessment: 0-10 Pain Score: 5  Pain Location: left knee Pain Descriptors / Indicators: Sore, Aching Pain Intervention(s): Monitored during session, Repositioned, Premedicated before session    Home Living  Prior Function            PT Goals (current goals can now be found in the care plan section) Progress towards PT goals: Progressing toward goals    Frequency    7X/week      PT Plan      Co-evaluation              AM-PAC PT "6 Clicks" Mobility   Outcome Measure  Help needed turning from your back to your side while in a flat bed without using bedrails?: A Little Help needed moving from lying on your back to sitting on the side of a flat bed without using bedrails?: A Little Help needed moving to and from a bed to a chair (including a wheelchair)?: A Little Help needed standing up from a chair using your arms (e.g., wheelchair or bedside chair)?: A Little Help needed to walk in hospital room?: A Little Help  needed climbing 3-5 steps with a railing? : A Little 6 Click Score: 18    End of Session Equipment Utilized During Treatment: Gait belt Activity Tolerance: Patient tolerated treatment well Patient left: in chair;with call bell/phone within reach;with family/visitor present;with chair alarm set Nurse Communication: Mobility status PT Visit Diagnosis: Difficulty in walking, not elsewhere classified (R26.2)     Time: 1442-1500 PT Time Calculation (min) (ACUTE ONLY): 18 min  Charges:    $Gait Training: 8-22 mins PT General Charges $$ ACUTE PT VISIT: 1 Visit                     Paulino Door, DPT Physical Therapist Acute Rehabilitation Services Office: [phone]    Janan Halter Payson 05/19/2023, 3:28 PM

## 2023-05-20 DIAGNOSIS — I110 Hypertensive heart disease with heart failure: Secondary | ICD-10-CM | POA: Diagnosis not present

## 2023-05-20 DIAGNOSIS — H539 Unspecified visual disturbance: Secondary | ICD-10-CM | POA: Diagnosis not present

## 2023-05-20 DIAGNOSIS — Z7984 Long term (current) use of oral hypoglycemic drugs: Secondary | ICD-10-CM | POA: Diagnosis not present

## 2023-05-20 DIAGNOSIS — M797 Fibromyalgia: Secondary | ICD-10-CM | POA: Diagnosis not present

## 2023-05-20 DIAGNOSIS — I4891 Unspecified atrial fibrillation: Secondary | ICD-10-CM | POA: Diagnosis not present

## 2023-05-20 DIAGNOSIS — I2510 Atherosclerotic heart disease of native coronary artery without angina pectoris: Secondary | ICD-10-CM | POA: Diagnosis not present

## 2023-05-20 DIAGNOSIS — Z7901 Long term (current) use of anticoagulants: Secondary | ICD-10-CM | POA: Diagnosis not present

## 2023-05-20 DIAGNOSIS — E785 Hyperlipidemia, unspecified: Secondary | ICD-10-CM | POA: Diagnosis not present

## 2023-05-20 DIAGNOSIS — Z96652 Presence of left artificial knee joint: Secondary | ICD-10-CM | POA: Diagnosis not present

## 2023-05-20 DIAGNOSIS — I872 Venous insufficiency (chronic) (peripheral): Secondary | ICD-10-CM | POA: Diagnosis not present

## 2023-05-20 DIAGNOSIS — N3281 Overactive bladder: Secondary | ICD-10-CM | POA: Diagnosis not present

## 2023-05-20 DIAGNOSIS — F32A Depression, unspecified: Secondary | ICD-10-CM | POA: Diagnosis not present

## 2023-05-20 DIAGNOSIS — H9193 Unspecified hearing loss, bilateral: Secondary | ICD-10-CM | POA: Diagnosis not present

## 2023-05-20 DIAGNOSIS — G4733 Obstructive sleep apnea (adult) (pediatric): Secondary | ICD-10-CM | POA: Diagnosis not present

## 2023-05-20 DIAGNOSIS — E119 Type 2 diabetes mellitus without complications: Secondary | ICD-10-CM | POA: Diagnosis not present

## 2023-05-20 DIAGNOSIS — Z9089 Acquired absence of other organs: Secondary | ICD-10-CM | POA: Diagnosis not present

## 2023-05-20 DIAGNOSIS — I700 Atherosclerosis of aorta: Secondary | ICD-10-CM | POA: Diagnosis not present

## 2023-05-20 DIAGNOSIS — I5030 Unspecified diastolic (congestive) heart failure: Secondary | ICD-10-CM | POA: Diagnosis not present

## 2023-05-20 DIAGNOSIS — M1990 Unspecified osteoarthritis, unspecified site: Secondary | ICD-10-CM | POA: Diagnosis not present

## 2023-05-20 DIAGNOSIS — D6859 Other primary thrombophilia: Secondary | ICD-10-CM | POA: Diagnosis not present

## 2023-05-20 DIAGNOSIS — N200 Calculus of kidney: Secondary | ICD-10-CM | POA: Diagnosis not present

## 2023-05-20 DIAGNOSIS — J449 Chronic obstructive pulmonary disease, unspecified: Secondary | ICD-10-CM | POA: Diagnosis not present

## 2023-05-20 DIAGNOSIS — R911 Solitary pulmonary nodule: Secondary | ICD-10-CM | POA: Diagnosis not present

## 2023-05-20 DIAGNOSIS — Z471 Aftercare following joint replacement surgery: Secondary | ICD-10-CM | POA: Diagnosis not present

## 2023-05-20 NOTE — Discharge Summary (Signed)
Patient ID: Tammy [name] MRN: [id number] DOB/AGE: April 15, 1951 72 y.o.  Admit date: 05/18/2023 Discharge date: 05/19/2023  Admission Diagnoses:  Principal Problem:   Unilateral primary osteoarthritis, left knee Active Problems:   Status post total left knee replacement   Discharge Diagnoses:  Same  Past Medical History:  Diagnosis Date   Aortic atherosclerosis (HCC)    Atrial fibrillation (HCC)    a.) CHA2DS2VASc = 5 (age, CHF, HTN, vascular disease history, T2DM);  b.) rate/rhythm maintained on oral diltiazem + sotolol; chronically anticoagulated with apixaban   CAD (coronary artery disease)    a.) cCTA 08/21/2014: Ca2+ score 873 (99th percentile for age/sex/race matched control)   CHF (congestive heart failure) (HCC)    a.) TTE 06/17/2020: EF 50-55%, LV dil, mild LVH, mild RVE, mod BAE, G1DD; b.) TTE 12/19/2022: EF >55%, mod LAE, triv TR/PR, G1DD   Complication of anesthesia    COPD (chronic obstructive pulmonary disease) (HCC)    DJD (degenerative joint disease)    Encephalitis    Fibromyalgia    History of kidney stones    HTN (hypertension)    Hyperlipidemia    Long term current use of anticoagulant    a.) apixaban   OAB (overactive bladder)    a.) on vibegron + fesoterodine   OSA on CPAP    PONV (postoperative nausea and vomiting)    T2DM (type 2 diabetes mellitus) (HCC)     Surgeries: Procedure(s): LEFT TOTAL KNEE ARTHROPLASTY on 05/18/2023   Consultants:   Discharged Condition: Improved  Hospital Course: Tammy [name] is an 72 y.o. female who was admitted 05/18/2023 for operative treatment ofUnilateral primary osteoarthritis, left knee. Patient has severe unremitting pain that affects sleep, daily activities, and work/hobbies. After pre-op clearance the patient was taken to the operating room on 05/18/2023 and underwent  Procedure(s): LEFT TOTAL KNEE ARTHROPLASTY.    Patient was given perioperative antibiotics:  Anti-infectives (From admission,  onward)    Start     Dose/Rate Route Frequency Ordered Stop   05/18/23 1600  valACYclovir (VALTREX) tablet 1,000 mg  Status:  Discontinued        1,000 mg Oral Daily 05/18/23 1310 05/19/23 2040   05/18/23 1500  ceFAZolin (ANCEF) IVPB 1 g/50 mL premix        1 g 100 mL/hr over 30 Minutes Intravenous Every 6 hours 05/18/23 1030 05/18/23 2150   05/18/23 0645  ceFAZolin (ANCEF) IVPB 2g/100 mL premix        2 g 200 mL/hr over 30 Minutes Intravenous On call to O.R. 05/18/23 0643 05/18/23 0900        Patient was given sequential compression devices, early ambulation, and chemoprophylaxis to prevent DVT.  Patient benefited maximally from hospital stay and there were no complications.    Recent vital signs: No data found.   Recent laboratory studies:  Recent Labs    05/18/23 1050 05/19/23 0315  WBC 7.4 13.8*  HGB 11.7* 12.0  HCT 38.4 38.5  PLT 231 222  NA  --  135  K  --  4.1  CL  --  101  CO2  --  27  BUN  --  16  CREATININE  --  0.67  GLUCOSE  --  129*  CALCIUM  --  9.1     Discharge Medications:   Allergies as of 05/19/2023       Reactions   Betadine [povidone Iodine] Anaphylaxis   Contrast Media [iodinated Contrast Media] Anaphylaxis   Iodine Anaphylaxis  Metrizamide Anaphylaxis   Povidone-iodine Anaphylaxis   Shellfish Allergy Anaphylaxis   Hydrocodone-acetaminophen Nausea Only        Medication List     TAKE these medications    albuterol 108 (90 Base) MCG/ACT inhaler Commonly known as: VENTOLIN HFA Inhale 2 puffs into the lungs every 6 (six) hours as needed.   beta carotene w/minerals tablet Take 1 tablet by mouth daily.   diltiazem 240 MG 24 hr capsule Commonly known as: CARDIZEM CD Take 1 capsule (240 mg total) by mouth daily.   DULoxetine 60 MG capsule Commonly known as: CYMBALTA Take 60 mg by mouth daily.   Eliquis 5 MG Tabs tablet Generic drug: apixaban Take 5 mg by mouth 2 (two) times daily.   EPINEPHrine 0.3 mg/0.3 mL Soaj  injection Commonly known as: EPI-PEN Inject 0.3 mg into the muscle as needed for anaphylaxis.   fesoterodine 4 MG Tb24 tablet Commonly known as: TOVIAZ Take 4 mg by mouth daily.   furosemide 20 MG tablet Commonly known as: LASIX Take 1 tablet (20 mg total) by mouth 2 (two) times daily. What changed: when to take this   Gemtesa 75 MG Tabs Generic drug: Vibegron Take 1 tablet by mouth daily.   isosorbide mononitrate 30 MG 24 hr tablet Commonly known as: IMDUR Take 1 tablet (30 mg total) by mouth 2 (two) times daily. What changed: when to take this   metFORMIN 500 MG tablet Commonly known as: GLUCOPHAGE Take 500 mg by mouth 2 (two) times daily.   Mounjaro 10 MG/0.5ML Pen Generic drug: tirzepatide Inject 10 mg into the skin every Tuesday.   Nexletol 180 MG Tabs Generic drug: Bempedoic Acid Take 1 tablet (180 mg total) by mouth daily.   oxyCODONE 5 MG immediate release tablet Commonly known as: Oxy IR/ROXICODONE Take 1-2 tablets (5-10 mg total) by mouth every 6 (six) hours as needed for moderate pain (pain score 4-6) (pain score 4-6).   Repatha SureClick 140 MG/ML Soaj Generic drug: Evolocumab Inject 140 mg into the skin every 14 (fourteen) days.   sotalol 80 MG tablet Commonly known as: BETAPACE Take 1 tablet (80 mg total) by mouth every 12 (twelve) hours.   tiZANidine 2 MG tablet Commonly known as: ZANAFLEX Take 1 tablet (2 mg total) by mouth every 6 (six) hours as needed for muscle spasms.   Trelegy Ellipta 100-62.5-25 MCG/ACT Aepb Generic drug: Fluticasone-Umeclidin-Vilant Inhale 1 Dose into the lungs daily.   valACYclovir 1000 MG tablet Commonly known as: VALTREX Take 1,000 mg by mouth daily.   valsartan 320 MG tablet Commonly known as: DIOVAN Take 320 mg by mouth daily.        Diagnostic Studies: DG Knee Left Port  Result Date: 05/18/2023 CLINICAL DATA:  Status post left knee replacement. EXAM: PORTABLE LEFT KNEE - 1-2 VIEW COMPARISON:  None  Available. FINDINGS: Left knee arthroplasty in expected alignment. No periprosthetic lucency or fracture. There has been patellar resurfacing. Recent postsurgical change includes air and edema in the soft tissues and joint space. Anterior skin staples in place. IMPRESSION: Left knee arthroplasty without immediate postoperative complication. Electronically Signed   By: Narda Rutherford M.D.   On: 05/18/2023 13:05   CARDIAC CATHETERIZATION  Result Date: 04/24/2023   Prox LAD to Mid LAD lesion is 45% stenosed.   The left ventricular systolic function is normal.   LV end diastolic pressure is normal.   The left ventricular ejection fraction is 55-65% by visual estimate.   There is no aortic valve  stenosis. Cardiac catheterization was done without complication.  Mid LAD had 45 percent lesion.  No significant disease in left circumflex or RCA.  Echocardiogram prior had normal ejection fraction.  Advise medical therapy.    Disposition: Discharge disposition: 01-Home or Self Care          Follow-up Information     [facility] Follow up.   Specialty: Home Health Services Why: to provide home physical therapy visits Contact information: [address] [place] [address] [phone]         Kathryne Hitch, MD Follow up in 2 week(s).   Specialty: Orthopedic Surgery Contact information: [address] [place] [address] [phone]                  Signed: Kathryne Hitch 05/20/2023, 2:04 PM

## 2023-05-21 ENCOUNTER — Encounter (HOSPITAL_COMMUNITY): Payer: Self-pay | Admitting: Orthopaedic Surgery

## 2023-05-21 ENCOUNTER — Telehealth: Payer: Self-pay | Admitting: *Deleted

## 2023-05-21 NOTE — Telephone Encounter (Signed)
Ortho bundle d/c call completed. 

## 2023-05-21 NOTE — Addendum Note (Signed)
Addendum  created 05/21/23 0655 by Garth Bigness, CRNA   Intraprocedure Meds edited

## 2023-05-23 ENCOUNTER — Encounter: Payer: Self-pay | Admitting: Pulmonary Disease

## 2023-05-23 MED ORDER — TRELEGY ELLIPTA 100-62.5-25 MCG/ACT IN AEPB: 1.0000 | INHALATION_SPRAY | Freq: Every day | RESPIRATORY_TRACT | 3 refills | Status: AC

## 2023-05-24 ENCOUNTER — Other Ambulatory Visit: Payer: Self-pay | Admitting: *Deleted

## 2023-05-24 ENCOUNTER — Telehealth: Payer: Self-pay | Admitting: *Deleted

## 2023-05-24 ENCOUNTER — Other Ambulatory Visit: Payer: Self-pay | Admitting: Orthopaedic Surgery

## 2023-05-24 DIAGNOSIS — I872 Venous insufficiency (chronic) (peripheral): Secondary | ICD-10-CM | POA: Diagnosis not present

## 2023-05-24 DIAGNOSIS — M1712 Unilateral primary osteoarthritis, left knee: Secondary | ICD-10-CM

## 2023-05-24 DIAGNOSIS — I5030 Unspecified diastolic (congestive) heart failure: Secondary | ICD-10-CM | POA: Diagnosis not present

## 2023-05-24 DIAGNOSIS — Z96652 Presence of left artificial knee joint: Secondary | ICD-10-CM

## 2023-05-24 DIAGNOSIS — E119 Type 2 diabetes mellitus without complications: Secondary | ICD-10-CM | POA: Diagnosis not present

## 2023-05-24 DIAGNOSIS — I4891 Unspecified atrial fibrillation: Secondary | ICD-10-CM | POA: Diagnosis not present

## 2023-05-24 DIAGNOSIS — I110 Hypertensive heart disease with heart failure: Secondary | ICD-10-CM | POA: Diagnosis not present

## 2023-05-24 DIAGNOSIS — Z471 Aftercare following joint replacement surgery: Secondary | ICD-10-CM | POA: Diagnosis not present

## 2023-05-24 MED ORDER — OXYCODONE HCL 5 MG PO TABS: 5.0000 mg | ORAL_TABLET | Freq: Four times a day (QID) | ORAL | 0 refills | Status: DC | PRN

## 2023-05-24 MED ORDER — TIZANIDINE HCL 2 MG PO TABS: 2.0000 mg | ORAL_TABLET | Freq: Four times a day (QID) | ORAL | 0 refills | Status: DC | PRN

## 2023-05-24 NOTE — Telephone Encounter (Signed)
Spoke with patient today who is really doing well with at home therapy. Requested refill of pain medication and muscle relaxer prior to the weekend. Thank you.

## 2023-05-25 ENCOUNTER — Telehealth: Payer: Self-pay | Admitting: *Deleted

## 2023-05-25 DIAGNOSIS — I872 Venous insufficiency (chronic) (peripheral): Secondary | ICD-10-CM | POA: Diagnosis not present

## 2023-05-25 DIAGNOSIS — E119 Type 2 diabetes mellitus without complications: Secondary | ICD-10-CM | POA: Diagnosis not present

## 2023-05-25 DIAGNOSIS — I4891 Unspecified atrial fibrillation: Secondary | ICD-10-CM | POA: Diagnosis not present

## 2023-05-25 DIAGNOSIS — I110 Hypertensive heart disease with heart failure: Secondary | ICD-10-CM | POA: Diagnosis not present

## 2023-05-25 DIAGNOSIS — I5030 Unspecified diastolic (congestive) heart failure: Secondary | ICD-10-CM | POA: Diagnosis not present

## 2023-05-25 DIAGNOSIS — Z471 Aftercare following joint replacement surgery: Secondary | ICD-10-CM | POA: Diagnosis not present

## 2023-05-25 NOTE — Telephone Encounter (Signed)
Patient called today and had a mixture of symptoms she was asking about. She mentioned when catheter removed after surgery, she had some burning for a few days and is having some blood in her urine still. She is on Eliquis. She is not eating much at all, so she has not yet had a BM either (7 days today). She has restarted her Mounjaro this week, so I think this has affected her appetite. I explained she has to eat to have a bowel movement. She did state finally that it isn't uncommon for her to go a week. She is not uncomfortable. I had her increase her food intake and make sure she is drinking plenty of fluids and continuing with the stool softener. Biggest concern was pain she is having in upper left arm that is radiating through her arm pit and down left side toward her breast. She didn't report any true chest pain, but did state she every once in a while will have to take a deep breath and maybe some mild SOB with activity. I emphasized if she has any difficulty breathing or chest pain, she should immediately call 911. I told her I'd reach out to you, but maybe also she should reach out to her PCP due to so many different symptoms.

## 2023-05-27 ENCOUNTER — Other Ambulatory Visit: Payer: Self-pay

## 2023-05-27 ENCOUNTER — Emergency Department
Admission: EM | Admit: 2023-05-27 | Discharge: 2023-05-27 | Disposition: A | Discharge status: Home / Self Care | Payer: Medicare Other | Attending: Emergency Medicine | Admitting: Emergency Medicine

## 2023-05-27 ENCOUNTER — Emergency Department: Payer: Medicare Other

## 2023-05-27 DIAGNOSIS — R069 Unspecified abnormalities of breathing: Secondary | ICD-10-CM | POA: Diagnosis not present

## 2023-05-27 DIAGNOSIS — R0602 Shortness of breath: Secondary | ICD-10-CM | POA: Diagnosis not present

## 2023-05-27 DIAGNOSIS — Z7901 Long term (current) use of anticoagulants: Secondary | ICD-10-CM | POA: Insufficient documentation

## 2023-05-27 DIAGNOSIS — R319 Hematuria, unspecified: Secondary | ICD-10-CM | POA: Diagnosis not present

## 2023-05-27 DIAGNOSIS — R0600 Dyspnea, unspecified: Secondary | ICD-10-CM | POA: Insufficient documentation

## 2023-05-27 DIAGNOSIS — R0789 Other chest pain: Secondary | ICD-10-CM | POA: Diagnosis not present

## 2023-05-27 DIAGNOSIS — Z20822 Contact with and (suspected) exposure to covid-19: Secondary | ICD-10-CM | POA: Insufficient documentation

## 2023-05-27 DIAGNOSIS — R600 Localized edema: Secondary | ICD-10-CM | POA: Diagnosis not present

## 2023-05-27 DIAGNOSIS — R300 Dysuria: Secondary | ICD-10-CM | POA: Diagnosis not present

## 2023-05-27 DIAGNOSIS — Z87891 Personal history of nicotine dependence: Secondary | ICD-10-CM | POA: Insufficient documentation

## 2023-05-27 DIAGNOSIS — R7989 Other specified abnormal findings of blood chemistry: Secondary | ICD-10-CM | POA: Diagnosis not present

## 2023-05-27 DIAGNOSIS — I700 Atherosclerosis of aorta: Secondary | ICD-10-CM | POA: Diagnosis not present

## 2023-05-27 DIAGNOSIS — R079 Chest pain, unspecified: Secondary | ICD-10-CM

## 2023-05-27 LAB — BASIC METABOLIC PANEL
Anion gap: 13 (ref 5–15)
BUN: 15 mg/dL (ref 8–23)
CO2: 25 mmol/L (ref 22–32)
Calcium: 8.8 mg/dL — ABNORMAL LOW (ref 8.9–10.3)
Chloride: 98 mmol/L (ref 98–111)
Creatinine, Ser: 0.67 mg/dL (ref 0.44–1.00)
GFR, Estimated: >60 mL/min (ref >60)
Glucose, Bld: 117 mg/dL — ABNORMAL HIGH (ref 70–99)
Potassium: 4.2 mmol/L (ref 3.5–5.1)
Sodium: 136 mmol/L (ref 135–145)

## 2023-05-27 LAB — URINALYSIS, ROUTINE W REFLEX MICROSCOPIC
Bilirubin Urine: NEGATIVE
Glucose, UA: NEGATIVE mg/dL
Hgb urine dipstick: NEGATIVE
Ketones, ur: 5 mg/dL — AB
Nitrite: NEGATIVE
Protein, ur: NEGATIVE mg/dL
Specific Gravity, Urine: 1.013 (ref 1.005–1.030)
pH: 5 (ref 5.0–8.0)

## 2023-05-27 LAB — CBC
HCT: 34.2 % — ABNORMAL LOW (ref 36.0–46.0)
Hemoglobin: 11.4 g/dL — ABNORMAL LOW (ref 12.0–15.0)
MCH: 28.4 pg (ref 26.0–34.0)
MCHC: 33.3 g/dL (ref 30.0–36.0)
MCV: 85.3 fL (ref 80.0–100.0)
Platelets: 431 10*3/uL — ABNORMAL HIGH (ref 150–400)
RBC: 4.01 MIL/uL (ref 3.87–5.11)
RDW: 16.7 % — ABNORMAL HIGH (ref 11.5–15.5)
WBC: 12.1 10*3/uL — ABNORMAL HIGH (ref 4.0–10.5)
nRBC: 0 % (ref 0.0–0.2)

## 2023-05-27 LAB — D-DIMER, QUANTITATIVE: D-Dimer, Quant: 2.57 ug{FEU}/mL — ABNORMAL HIGH (ref 0.00–0.50)

## 2023-05-27 LAB — RESP PANEL BY RT-PCR (FLU A&B, COVID) ARPGX2
Influenza A by PCR: NEGATIVE
Influenza B by PCR: NEGATIVE
SARS Coronavirus 2 by RT PCR: NEGATIVE

## 2023-05-27 LAB — TROPONIN I (HIGH SENSITIVITY)
Troponin I (High Sensitivity): 12 ng/L (ref <18)
Troponin I (High Sensitivity): 10 ng/L (ref <18)

## 2023-05-27 LAB — LACTIC ACID, PLASMA: Lactic Acid, Venous: 1.1 mmol/L (ref 0.5–1.9)

## 2023-05-27 MED ORDER — LACTATED RINGERS IV BOLUS: 500.0000 mL | Freq: Once | INTRAVENOUS | Status: DC

## 2023-05-27 MED ORDER — TECHNETIUM TO 99M ALBUMIN AGGREGATED
4.1300 | Freq: Once | INTRAVENOUS | Status: AC | PRN
Start: 2023-05-27 — End: 2023-05-27
  Administered 2023-05-27: 4.13 via INTRAVENOUS

## 2023-05-27 MED ORDER — SODIUM CHLORIDE 0.9 % IV BOLUS
500.0000 mL | Freq: Once | INTRAVENOUS | Status: AC
  Administered 2023-05-27: 500 mL via INTRAVENOUS

## 2023-05-27 NOTE — ED Provider Notes (Signed)
Patient care assumed from Dr. Fanny Bien.  Workup is reassuring urinalysis shows no obvious infection.  Troponin negative, lactic acid is normal, COVID/flu/RSV is normal, chemistry reassuring, CBC shows white blood cell count of 12,000, reassuring, D-dimer is elevated 2.57, blood perfusion scan again has resulted normal.  Chest x-ray is clear.  Given the patient's reassuring workup I believe the patient safe for discharge home with outpatient follow-up.  Patient agreeable to plan of care.   Tammy Antis, MD 05/27/23 1945

## 2023-05-27 NOTE — ED Triage Notes (Signed)
Pt in via EMS from home with c/o breathing difficulty. Pt reports yesterday and this am when she would get up to do something she would get winded. Pt also reports pain under her left arm around the breast area. Pt does take eloquis. Pt had knee surgery on the 1st and has been home for a week. PT came to her house x's 2. Pt had cather removed and has had blood in her urine since then. Pt also reports some chills and sweats and last took tylenol at 11. CBG 119, HR 85, 97%, #18 g to left wrist. 138/78

## 2023-05-27 NOTE — ED Notes (Signed)
Pt at Nuclear medicine

## 2023-05-27 NOTE — ED Notes (Signed)
Pt back from nuclear medicine.

## 2023-05-27 NOTE — ED Notes (Signed)
Pt given water 

## 2023-05-27 NOTE — ED Provider Notes (Addendum)
[facility] Provider Note    Event Date/Time   First MD Initiated Contact with Patient 05/27/23 1339     (approximate)   History   Shortness of breath and lightheadedness  HPI  Tammy [name] is a 72 y.o. female history of atrial fibrillation, anticoagulation which is active, recent left knee orthopedic surgery, etc.  Patient was in her home, though she was feeling lightheaded.  She began feeling lightheaded and then developed a feeling of shortness of breath and a somewhat hard to describe chest discomfort that is nonradiating.  It seemed to be worse while she was sitting up or trying to move about.  She also relates 3 days of noticing slight amount of blood or change in her urination as well as discomfort with urination.  She reports that started after having a urinary catheter removed  Her left knee is healing well.  She has seen slight swelling in the left knee but no major pain and she is getting along well with it.  She has not had no fevers or chills     Physical Exam   Triage Vital Signs: ED Triage Vitals  Encounter Vitals Group     BP 05/27/23 1317 (!) 89/58     Systolic BP Percentile --      Diastolic BP Percentile --      Pulse Rate 05/27/23 1317 89     Resp 05/27/23 1317 20     Temp 05/27/23 1317 97.6 F (36.4 C)     Temp Source 05/27/23 1317 Oral     SpO2 05/27/23 1317 94 %     Weight 05/27/23 1315 188 lb (85.3 kg)     Height 05/27/23 1315 5' (1.524 m)     Head Circumference --      Peak Flow --      Pain Score 05/27/23 1315 0     Pain Loc --      Pain Education --      Exclude from Growth Chart --     Most recent vital signs: Vitals:   05/27/23 1700 05/27/23 1730  BP: (!) 109/49 (!) 115/55  Pulse: 79 79  Resp: 13 13  Temp:    SpO2: 95% 96%   Initial blood pressure noted hypotension present.  General: Awake, no distress.  She appears very calm, friend at the bedside.  She is very pleasant.  In no acute distress  with normal work of breathing CV:  Good peripheral perfusion.  Normal heart tones, rate normal Resp:  Normal effort.  Clear bilaterally with normal work of breathing.  She relates that she is not feeling short of breath at this time Abd:  No distention.  Soft nontender throughout Other:  Mild left lower extremity edema compared to the right.  Anterior knee incision clean dry intact.  No venous cords or congestion are notable.  There is no obvious acute calf tenderness.   ED Results / Procedures / Treatments   Labs (all labs ordered are listed, but only abnormal results are displayed) Labs Reviewed  BASIC METABOLIC PANEL - Abnormal; Notable for the following components:      Result Value   Glucose, Bld 117 (*)    Calcium 8.8 (*)    All other components within normal limits  CBC - Abnormal; Notable for the following components:   WBC 12.1 (*)    Hemoglobin 11.4 (*)    HCT 34.2 (*)    RDW 16.7 (*)  Platelets 431 (*)    All other components within normal limits  D-DIMER, QUANTITATIVE - Abnormal; Notable for the following components:   D-Dimer, Quant 2.57 (*)    All other components within normal limits  URINALYSIS, ROUTINE W REFLEX MICROSCOPIC - Abnormal; Notable for the following components:   Color, Urine YELLOW (*)    APPearance CLEAR (*)    Ketones, ur 5 (*)    Leukocytes,Ua MODERATE (*)    Bacteria, UA RARE (*)    All other components within normal limits  RESP PANEL BY RT-PCR (FLU A&B, COVID) [id number]  URINE CULTURE  LACTIC ACID, PLASMA  TROPONIN I (HIGH SENSITIVITY)  TROPONIN I (HIGH SENSITIVITY)     EKG   Interpreted by me as normal sinus rhythm, questionable underlying LVH.  No evidence of acute frank ischemia or heart strain is noted  RADIOLOGY  Chest x-ray interpreted by me as negative for acute infiltrative process  US Venous Img Lower Bilateral  Result Date: 05/27/2023 CLINICAL DATA:  Bilateral lower extremity edema. History of smoking. Evaluate for DVT.  EXAM: BILATERAL LOWER EXTREMITY VENOUS DOPPLER ULTRASOUND TECHNIQUE: Gray-scale sonography with graded compression, as well as color Doppler and duplex ultrasound were performed to evaluate the lower extremity deep venous systems from the level of the common femoral vein and including the common femoral, femoral, profunda femoral, popliteal and calf veins including the posterior tibial, peroneal and gastrocnemius veins when visible. The superficial great saphenous vein was also interrogated. Spectral Doppler was utilized to evaluate flow at rest and with distal augmentation maneuvers in the common femoral, femoral and popliteal veins. COMPARISON:  None Available. FINDINGS: RIGHT LOWER EXTREMITY Common Femoral Vein: No evidence of thrombus. Normal compressibility, respiratory phasicity and response to augmentation. Saphenofemoral Junction: No evidence of thrombus. Normal compressibility and flow on color Doppler imaging. Profunda Femoral Vein: No evidence of thrombus. Normal compressibility and flow on color Doppler imaging. Femoral Vein: No evidence of thrombus. Normal compressibility, respiratory phasicity and response to augmentation. Popliteal Vein: No evidence of thrombus. Normal compressibility, respiratory phasicity and response to augmentation. Calf Veins: No evidence of thrombus. Normal compressibility and flow on color Doppler imaging. Superficial Great Saphenous Vein: No evidence of thrombus. Normal compressibility. Other Findings:  None. LEFT LOWER EXTREMITY Common Femoral Vein: No evidence of thrombus. Normal compressibility, respiratory phasicity and response to augmentation. Saphenofemoral Junction: No evidence of thrombus. Normal compressibility and flow on color Doppler imaging. Profunda Femoral Vein: No evidence of thrombus. Normal compressibility and flow on color Doppler imaging. Femoral Vein: No evidence of thrombus. Normal compressibility, respiratory phasicity and response to augmentation.  Popliteal Vein: No evidence of thrombus. Normal compressibility, respiratory phasicity and response to augmentation. Calf Veins: No evidence of thrombus. Normal compressibility and flow on color Doppler imaging. Superficial Great Saphenous Vein: No evidence of thrombus. Normal compressibility. Other Findings:  None. IMPRESSION: No evidence of DVT within either lower extremity. Electronically Signed   By: Simonne Come M.D.   On: 05/27/2023 15:26   DG Chest 2 View  Result Date: 05/27/2023 CLINICAL DATA:  Shortness of breath. EXAM: CHEST - 2 VIEW COMPARISON:  12/29/2022 FINDINGS: Normal heart size. Aortic atherosclerotic calcifications. No pleural fluid, interstitial edema, or airspace disease. Degenerative change noted within the thoracic spine. IMPRESSION: 1. No acute cardiopulmonary disease. 2. Aortic Atherosclerosis (ICD10-I70.0). Electronically Signed   By: Signa Kell M.D.   On: 05/27/2023 14:17      PROCEDURES:  Critical Care performed: No  Procedures   MEDICATIONS ORDERED IN ED: Medications  sodium chloride 0.9 % bolus 500 mL (0 mLs Intravenous Stopped 05/27/23 1733)  technetium albumin aggregated (MAA) injection solution 4.13 millicurie (4.13 millicuries Intravenous Contrast Given 05/27/23 1802)     IMPRESSION / MDM / ASSESSMENT AND PLAN / ED COURSE  I reviewed the triage vital signs and the nursing notes.                              Differential diagnosis includes, but is not limited to, postoperative pneumonia, lung infection, less likely coronary disease with reassuring ECG and initial troponin.  Her symptoms seem a little bit atypical hard to describe chest discomfort that is now improved.  Recent orthopedic surgery risk for thromboembolism.  Multiple considerations for different causes but her workup is notable for slightly elevated white count.  Renal function is preserved.  Initial troponin is normal.  She does relate some symptoms of potential urinary symptomatology, her  urinalysis is pending at this time of signout to Dr. Lenard Lance  Chest x-ray is clear.  Lower extremity DVT study negative for DVT is reassuring, she is actively anticoagulated.  However given her notable risk factors, D-dimer is sent, and this is positive leading to his increased potential risk for thromboembolism. She will require nuclear medicine perfusion study.  Ongoing care and disposition assigned to Dr. Evans Lance follow-up on repeat troponin testing, urinalysis, and further care and including potential perfusion study which Dr. Lenard Lance placing orders for as we review her studies to this point  Patient's presentation is most consistent with acute complicated illness / injury requiring diagnostic workup.   The patient is on the cardiac monitor to evaluate for evidence of arrhythmia and/or significant heart rate changes.  Left lower extremity appears to be healing well  Regarding her hypertension she reports that she has had somewhat decreased appetite but still drinking fluids.  She does appear slightly hypovolemic with slightly dry mucous membranes and reports decreased appetite over the last week due to taking multiple medications which she now reports she has had no further need for some of the strong pain medicine that she was on, and her appetite seems to come back.  She overall appears well, no evidence that would suggest acute shock pathology.  Lactic acid is pending as well.  Will give a small fluid bolus I suspect this may be small element of dehydration as well, and feel less likely this is representative of a severe infection or septic picture if her blood pressure rebounds a small fluid bolus given the clinical history.     FINAL CLINICAL IMPRESSION(S) / ED DIAGNOSES   Final diagnoses:  Chest pain with low risk for cardiac etiology  Dyspnea, unspecified type  Dysuria     Rx / DC Orders   ED Discharge Orders     None        Note:  This document was prepared  using Dragon voice recognition software and may include unintentional dictation errors.   Sharyn Creamer, MD 05/27/23 7253    Sharyn Creamer, MD 05/27/23 [phone]

## 2023-05-27 NOTE — ED Triage Notes (Addendum)
Pt states coming in with shortness of breath that started this morning. Pt states with movement it gets worse. Pt denies pain. Pt reports COPD, a-fib, and CHF. Pt reports being on eliquis. Pt reports cold sweets as well. Pt reports feeling dizziness that started this morning (BP noted to be 89/58) in triage. No slurred speech noted. No facial droop and able to move all extremities appropriately

## 2023-05-28 ENCOUNTER — Encounter: Payer: Self-pay | Admitting: Cardiovascular Disease

## 2023-05-28 ENCOUNTER — Ambulatory Visit (INDEPENDENT_AMBULATORY_CARE_PROVIDER_SITE_OTHER): Payer: Medicare Other | Admitting: Cardiovascular Disease

## 2023-05-28 VITALS — BP 89/67 | HR 70 | Ht 60.0 in | Wt 189.8 lb

## 2023-05-28 DIAGNOSIS — I5030 Unspecified diastolic (congestive) heart failure: Secondary | ICD-10-CM | POA: Diagnosis not present

## 2023-05-28 DIAGNOSIS — G4730 Sleep apnea, unspecified: Secondary | ICD-10-CM

## 2023-05-28 DIAGNOSIS — R0602 Shortness of breath: Secondary | ICD-10-CM | POA: Diagnosis not present

## 2023-05-28 DIAGNOSIS — I2510 Atherosclerotic heart disease of native coronary artery without angina pectoris: Secondary | ICD-10-CM

## 2023-05-28 DIAGNOSIS — I4891 Unspecified atrial fibrillation: Secondary | ICD-10-CM | POA: Diagnosis not present

## 2023-05-28 DIAGNOSIS — Z471 Aftercare following joint replacement surgery: Secondary | ICD-10-CM | POA: Diagnosis not present

## 2023-05-28 DIAGNOSIS — E119 Type 2 diabetes mellitus without complications: Secondary | ICD-10-CM | POA: Diagnosis not present

## 2023-05-28 DIAGNOSIS — I10 Essential (primary) hypertension: Secondary | ICD-10-CM | POA: Diagnosis not present

## 2023-05-28 DIAGNOSIS — I5031 Acute diastolic (congestive) heart failure: Secondary | ICD-10-CM

## 2023-05-28 DIAGNOSIS — E782 Mixed hyperlipidemia: Secondary | ICD-10-CM | POA: Diagnosis not present

## 2023-05-28 DIAGNOSIS — I872 Venous insufficiency (chronic) (peripheral): Secondary | ICD-10-CM | POA: Diagnosis not present

## 2023-05-28 DIAGNOSIS — I110 Hypertensive heart disease with heart failure: Secondary | ICD-10-CM | POA: Diagnosis not present

## 2023-05-28 MED ORDER — DAPAGLIFLOZIN PROPANEDIOL 10 MG PO TABS: 10.0000 mg | ORAL_TABLET | Freq: Every day | ORAL | 3 refills | Status: DC

## 2023-05-28 MED ORDER — VALSARTAN 80 MG PO TABS: 80.0000 mg | ORAL_TABLET | Freq: Every day | ORAL | 11 refills | Status: DC

## 2023-05-28 MED ORDER — TORSEMIDE 10 MG PO TABS
10.0000 mg | ORAL_TABLET | Freq: Every day | ORAL | 11 refills | Status: DC
Start: 2023-05-28 — End: 2023-07-19

## 2023-05-28 MED ORDER — SPIRONOLACTONE 25 MG PO TABS: 12.5000 mg | ORAL_TABLET | Freq: Every day | ORAL | 11 refills | Status: DC

## 2023-05-28 NOTE — Progress Notes (Signed)
Cardiology Office Note   Date:  05/28/2023   ID:  Tammy [name], DOB [birth year], MRN [id number]  PCP:  Cleatis Polka., MD  Cardiologist:  Adrian Blackwater, MD      History of Present Illness: Tammy [name] is a 72 y.o. female who presents for  Chief Complaint  Patient presents with   Follow-up    Pt started Nexletol 180mg  1 week ago, not sure if it has anything to do with why she can not breathe    Very SOB, and BP is low with dizziness      Past Medical History:  Diagnosis Date   Aortic atherosclerosis (HCC)    Atrial fibrillation (HCC)    a.) CHA2DS2VASc = 5 (age, CHF, HTN, vascular disease history, T2DM);  b.) rate/rhythm maintained on oral diltiazem + sotolol; chronically anticoagulated with apixaban   CAD (coronary artery disease)    a.) cCTA 08/21/2014: Ca2+ score 873 (99th percentile for age/sex/race matched control)   CHF (congestive heart failure) (HCC)    a.) TTE 06/17/2020: EF 50-55%, LV dil, mild LVH, mild RVE, mod BAE, G1DD; b.) TTE 12/19/2022: EF >55%, mod LAE, triv TR/PR, G1DD   Complication of anesthesia    COPD (chronic obstructive pulmonary disease) (HCC)    DJD (degenerative joint disease)    Encephalitis    Fibromyalgia    History of kidney stones    HTN (hypertension)    Hyperlipidemia    Long term current use of anticoagulant    a.) apixaban   OAB (overactive bladder)    a.) on vibegron + fesoterodine   OSA on CPAP    PONV (postoperative nausea and vomiting)    T2DM (type 2 diabetes mellitus) (HCC)      Past Surgical History:  Procedure Laterality Date   APPENDECTOMY     BACK SURGERY  2022   BREAST CYST EXCISION     BREAST EXCISIONAL BIOPSY Left 2004   COLONOSCOPY WITH PROPOFOL N/A 11/26/2020   Procedure: COLONOSCOPY WITH PROPOFOL;  Surgeon: Jeani Hawking, MD;  Location: [facility];  Service: Endoscopy;  Laterality: N/A;   COLONOSCOPY WITH PROPOFOL N/A 02/16/2023   Procedure: COLONOSCOPY WITH PROPOFOL;  Surgeon:  Jeani Hawking, MD;  Location: [facility];  Service: Gastroenterology;  Laterality: N/A;   DIAGNOSTIC LAPAROSCOPY     HEMOSTASIS CLIP PLACEMENT  11/26/2020   Procedure: HEMOSTASIS CLIP PLACEMENT;  Surgeon: Jeani Hawking, MD;  Location: [facility];  Service: Endoscopy;;   KNEE ARTHROSCOPY     LEFT HEART CATH AND CORONARY ANGIOGRAPHY N/A 04/24/2023   Procedure: LEFT HEART CATH AND CORONARY ANGIOGRAPHY;  Surgeon: Laurier Nancy, MD;  Location: [facility];  Service: Cardiovascular;  Laterality: N/A;   POLYPECTOMY  11/26/2020   Procedure: POLYPECTOMY;  Surgeon: Jeani Hawking, MD;  Location: [facility];  Service: Endoscopy;;   POLYPECTOMY  02/16/2023   Procedure: POLYPECTOMY;  Surgeon: Jeani Hawking, MD;  Location: [facility];  Service: Gastroenterology;;   SUBMUCOSAL TATTOO INJECTION  11/26/2020   Procedure: SUBMUCOSAL TATTOO INJECTION;  Surgeon: Jeani Hawking, MD;  Location: [facility];  Service: Endoscopy;;   TEE WITHOUT CARDIOVERSION N/A 06/29/2020   Procedure: TRANSESOPHAGEAL ECHOCARDIOGRAM (TEE) with DCCV;  Surgeon: Laurier Nancy, MD;  Location: [facility];  Service: Cardiovascular;  Laterality: N/A;   TOTAL KNEE ARTHROPLASTY Left 05/18/2023   Procedure: LEFT TOTAL KNEE ARTHROPLASTY;  Surgeon: Kathryne Hitch, MD;  Location: [facility];  Service: Orthopedics;  Laterality: Left;     Current Outpatient  Medications  Medication Sig Dispense Refill   dapagliflozin propanediol (FARXIGA) 10 MG TABS tablet Take 1 tablet (10 mg total) by mouth daily before breakfast. 30 tablet 3   spironolactone (ALDACTONE) 25 MG tablet Take 0.5 tablets (12.5 mg total) by mouth daily. 30 tablet 11   torsemide (DEMADEX) 10 MG tablet Take 1 tablet (10 mg total) by mouth daily. 30 tablet 11   valsartan (DIOVAN) 80 MG tablet Take 1 tablet (80 mg total) by mouth daily. 30 tablet 11   albuterol (VENTOLIN HFA) 108 (90 Base) MCG/ACT inhaler Inhale 2 puffs into the lungs every 6 (six) hours as needed. 8  g 2   apixaban (ELIQUIS) 5 MG TABS tablet Take 5 mg by mouth 2 (two) times daily.     Bempedoic Acid (NEXLETOL) 180 MG TABS Take 1 tablet (180 mg total) by mouth daily. 30 tablet 3   beta carotene w/minerals (OCUVITE) tablet Take 1 tablet by mouth daily.     diltiazem (CARDIZEM CD) 240 MG 24 hr capsule Take 1 capsule (240 mg total) by mouth daily. 90 capsule 0   DULoxetine (CYMBALTA) 60 MG capsule Take 60 mg by mouth daily.     EPINEPHrine 0.3 mg/0.3 mL IJ SOAJ injection Inject 0.3 mg into the muscle as needed for anaphylaxis.     Evolocumab (REPATHA SURECLICK) 140 MG/ML SOAJ Inject 140 mg into the skin every 14 (fourteen) days.     fesoterodine (TOVIAZ) 4 MG TB24 tablet Take 4 mg by mouth daily.     Fluticasone-Umeclidin-Vilant (TRELEGY ELLIPTA) 100-62.5-25 MCG/ACT AEPB Inhale 1 Dose into the lungs daily. 180 each 3   isosorbide mononitrate (IMDUR) 30 MG 24 hr tablet Take 1 tablet (30 mg total) by mouth 2 (two) times daily. (Patient taking differently: Take 30 mg by mouth daily.) 60 tablet 3   metFORMIN (GLUCOPHAGE) 500 MG tablet Take 500 mg by mouth 2 (two) times daily.     oxyCODONE (OXY IR/ROXICODONE) 5 MG immediate release tablet Take 1-2 tablets (5-10 mg total) by mouth every 6 (six) hours as needed for moderate pain (pain score 4-6) (pain score 4-6). 30 tablet 0   sotalol (BETAPACE) 80 MG tablet Take 1 tablet (80 mg total) by mouth every 12 (twelve) hours. 60 tablet 0   tirzepatide (MOUNJARO) 10 MG/0.5ML Pen Inject 10 mg into the skin every Tuesday.     tiZANidine (ZANAFLEX) 2 MG tablet Take 1 tablet (2 mg total) by mouth every 6 (six) hours as needed for muscle spasms. 30 tablet 0   valACYclovir (VALTREX) 1000 MG tablet Take 1,000 mg by mouth daily.     Vibegron (GEMTESA) 75 MG TABS Take 1 tablet by mouth daily.     No current facility-administered medications for this visit.   Facility-Administered Medications Ordered in Other Visits  Medication Dose Route Frequency Provider Last Rate  Last Admin   sodium chloride flush (NS) 0.9 % injection 3 mL  3 mL Intravenous Q12H Adrian Blackwater A, MD        Allergies:   Betadine [povidone iodine], Contrast media [iodinated contrast media], Iodine, Metrizamide, Povidone-iodine, Shellfish allergy, and Hydrocodone-acetaminophen    Social History:   reports that she quit smoking about 14 years ago. Her smoking use included cigarettes. She started smoking about 44 years ago. She has a 30 pack-year smoking history. She has never used smokeless tobacco. She reports that she does not drink alcohol and does not use drugs.   Family History:  family history includes Breast cancer in  her cousin; CAD (age of onset: 65) in her mother; CAD (age of onset: 38) in her brother; Diabetes in her father; Heart disease in her father; Stomach cancer in her maternal grandfather; Throat cancer in her paternal uncle.    ROS:     Review of Systems  Constitutional: Negative.   HENT: Negative.    Eyes: Negative.   Respiratory: Negative.    Gastrointestinal: Negative.   Genitourinary: Negative.   Musculoskeletal: Negative.   Skin: Negative.   Neurological: Negative.   Endo/Heme/Allergies: Negative.   Psychiatric/Behavioral: Negative.    All other systems reviewed and are negative.     All other systems are reviewed and negative.    PHYSICAL EXAM: VS:  BP (!) 89/67   Pulse 70   Ht 5' (1.524 m)   Wt 189 lb 12.8 oz (86.1 kg)   SpO2 97%   BMI 37.07 kg/m  , BMI Body mass index is 37.07 kg/m. Last weight:  Wt Readings from Last 3 Encounters:  05/28/23 189 lb 12.8 oz (86.1 kg)  05/27/23 188 lb (85.3 kg)  05/18/23 192 lb (87.1 kg)     Physical Exam Constitutional:      Appearance: Normal appearance.  Cardiovascular:     Rate and Rhythm: Normal rate and regular rhythm.     Heart sounds: Normal heart sounds.  Pulmonary:     Effort: Pulmonary effort is normal.     Breath sounds: Normal breath sounds.  Musculoskeletal:     Right lower leg: No  edema.     Left lower leg: No edema.  Neurological:     Mental Status: She is alert.       EKG:   Recent Labs: 05/09/2023: ALT 12 05/27/2023: BUN 15; Creatinine, Ser 0.67; Hemoglobin 11.4; Platelets 431; Potassium 4.2; Sodium 136    Lipid Panel    Component Value Date/Time   CHOL 265 (H) 03/22/2023 1115   CHOL 285 (H) 04/23/2015 1144   TRIG 105 03/22/2023 1115   TRIG 140 04/23/2015 1144   HDL 54 03/22/2023 1115   HDL 50 04/23/2015 1144   CHOLHDL 4.9 (H) 03/22/2023 1115   CHOLHDL 2.3 06/17/2020 0450   VLDL 10 06/17/2020 0450   LDLCALC 193 (H) 03/22/2023 1115   LDLCALC 207 (H) 04/23/2015 1144      Other studies Reviewed: Additional studies/ records that were reviewed today include:  Review of the above records demonstrates:      11/23/2016    2:49 PM  PAD Screen  Previous PAD dx? No  Previous surgical procedure? No  Pain with walking? Yes  Subsides with rest? Yes  Feet/toe relief with dangling? Yes  Painful, non-healing ulcers? No  Extremities discolored? No      ASSESSMENT AND PLAN:    ICD-10-CM   1. SOB (shortness of breath)  R06.02 valsartan (DIOVAN) 80 MG tablet    spironolactone (ALDACTONE) 25 MG tablet    dapagliflozin propanediol (FARXIGA) 10 MG TABS tablet   Cath negative cxr, neg, DVT evaluation negative, d-dimer and tropronins, negative. Add farxiga and aldactone, and decrease diovan    2. Primary hypertension  I10 valsartan (DIOVAN) 80 MG tablet    spironolactone (ALDACTONE) 25 MG tablet    dapagliflozin propanediol (FARXIGA) 10 MG TABS tablet   diovan 320 to 80 mg as BPS 89    3. Acute diastolic CHF (congestive heart failure) (HCC)  I50.31 valsartan (DIOVAN) 80 MG tablet    spironolactone (ALDACTONE) 25 MG tablet    dapagliflozin propanediol (  FARXIGA) 10 MG TABS tablet    4. Sleep apnea, unspecified type  G47.30 valsartan (DIOVAN) 80 MG tablet    spironolactone (ALDACTONE) 25 MG tablet    dapagliflozin propanediol (FARXIGA) 10 MG TABS  tablet    5. Coronary artery disease involving native coronary artery of native heart without angina pectoris  I25.10 valsartan (DIOVAN) 80 MG tablet    spironolactone (ALDACTONE) 25 MG tablet    dapagliflozin propanediol (FARXIGA) 10 MG TABS tablet    6. Mixed hyperlipidemia  E78.2 valsartan (DIOVAN) 80 MG tablet    spironolactone (ALDACTONE) 25 MG tablet    dapagliflozin propanediol (FARXIGA) 10 MG TABS tablet       Problem List Items Addressed This Visit       Cardiovascular and Mediastinum   HTN (hypertension)   Relevant Medications   valsartan (DIOVAN) 80 MG tablet   spironolactone (ALDACTONE) 25 MG tablet   dapagliflozin propanediol (FARXIGA) 10 MG TABS tablet   torsemide (DEMADEX) 10 MG tablet   Acute diastolic CHF (congestive heart failure) (HCC)   Relevant Medications   valsartan (DIOVAN) 80 MG tablet   spironolactone (ALDACTONE) 25 MG tablet   dapagliflozin propanediol (FARXIGA) 10 MG TABS tablet   torsemide (DEMADEX) 10 MG tablet     Respiratory   Sleep apnea   Relevant Medications   valsartan (DIOVAN) 80 MG tablet   spironolactone (ALDACTONE) 25 MG tablet   dapagliflozin propanediol (FARXIGA) 10 MG TABS tablet     Other   Hyperlipidemia   Relevant Medications   valsartan (DIOVAN) 80 MG tablet   spironolactone (ALDACTONE) 25 MG tablet   dapagliflozin propanediol (FARXIGA) 10 MG TABS tablet   torsemide (DEMADEX) 10 MG tablet   SOB (shortness of breath) - Primary   Relevant Medications   valsartan (DIOVAN) 80 MG tablet   spironolactone (ALDACTONE) 25 MG tablet   dapagliflozin propanediol (FARXIGA) 10 MG TABS tablet   Other Visit Diagnoses     Coronary artery disease involving native coronary artery of native heart without angina pectoris       Relevant Medications   valsartan (DIOVAN) 80 MG tablet   spironolactone (ALDACTONE) 25 MG tablet   dapagliflozin propanediol (FARXIGA) 10 MG TABS tablet   torsemide (DEMADEX) 10 MG tablet           Disposition:   Return in about 1 week (around 06/04/2023).    Total time spent: 30 minutes  Signed,  Adrian Blackwater, MD  05/28/2023 2:08 PM    [facility]

## 2023-05-30 DIAGNOSIS — I5030 Unspecified diastolic (congestive) heart failure: Secondary | ICD-10-CM | POA: Diagnosis not present

## 2023-05-30 DIAGNOSIS — I4891 Unspecified atrial fibrillation: Secondary | ICD-10-CM | POA: Diagnosis not present

## 2023-05-30 DIAGNOSIS — Z471 Aftercare following joint replacement surgery: Secondary | ICD-10-CM | POA: Diagnosis not present

## 2023-05-30 DIAGNOSIS — I872 Venous insufficiency (chronic) (peripheral): Secondary | ICD-10-CM | POA: Diagnosis not present

## 2023-05-30 DIAGNOSIS — I110 Hypertensive heart disease with heart failure: Secondary | ICD-10-CM | POA: Diagnosis not present

## 2023-05-30 DIAGNOSIS — E119 Type 2 diabetes mellitus without complications: Secondary | ICD-10-CM | POA: Diagnosis not present

## 2023-05-30 LAB — URINE CULTURE: Culture: 20000 — AB

## 2023-05-31 ENCOUNTER — Telehealth: Payer: Self-pay

## 2023-05-31 ENCOUNTER — Encounter: Payer: Self-pay | Admitting: Orthopaedic Surgery

## 2023-05-31 ENCOUNTER — Ambulatory Visit (INDEPENDENT_AMBULATORY_CARE_PROVIDER_SITE_OTHER): Payer: Medicare Other | Admitting: Orthopaedic Surgery

## 2023-05-31 DIAGNOSIS — Z96652 Presence of left artificial knee joint: Secondary | ICD-10-CM

## 2023-05-31 NOTE — Progress Notes (Signed)
The patient is here for her first postoperative visit status post a left total knee replacement.  She is doing well.  She is ready to transition outpatient physical therapy.  She is ambulating with a rolling walker.  She reports not having really good strength with that left knee.  She would like to go ahead and drive.  She is only taking narcotics on a minimal basis.  Examination of her left knee shows the calf is soft.  She is on chronic blood thinning medications.  Her motor exam seems normal.  Her extension is almost full and I can flex her to just past 90 degrees.  At this point she will transition outpatient physical therapy.  She can drive from my standpoint as long as a narcotic is not in her system.  When she does need a refill of medication she will let us know.  We will see her back in 4 weeks for repeat exam but no x-rays are needed unless there are issues.

## 2023-05-31 NOTE — Telephone Encounter (Signed)
Marlaine Hind w/ [facility] called requesting for pts labs to be faxed over to [phone]- labs have been faxed 05/30/2024-HQ

## 2023-06-01 DIAGNOSIS — R300 Dysuria: Secondary | ICD-10-CM | POA: Diagnosis not present

## 2023-06-01 DIAGNOSIS — I5030 Unspecified diastolic (congestive) heart failure: Secondary | ICD-10-CM | POA: Diagnosis not present

## 2023-06-01 DIAGNOSIS — I872 Venous insufficiency (chronic) (peripheral): Secondary | ICD-10-CM | POA: Diagnosis not present

## 2023-06-01 DIAGNOSIS — N390 Urinary tract infection, site not specified: Secondary | ICD-10-CM | POA: Diagnosis not present

## 2023-06-01 DIAGNOSIS — R911 Solitary pulmonary nodule: Secondary | ICD-10-CM | POA: Diagnosis not present

## 2023-06-01 DIAGNOSIS — M179 Osteoarthritis of knee, unspecified: Secondary | ICD-10-CM | POA: Diagnosis not present

## 2023-06-01 DIAGNOSIS — E1149 Type 2 diabetes mellitus with other diabetic neurological complication: Secondary | ICD-10-CM | POA: Diagnosis not present

## 2023-06-01 DIAGNOSIS — Z471 Aftercare following joint replacement surgery: Secondary | ICD-10-CM | POA: Diagnosis not present

## 2023-06-01 DIAGNOSIS — I110 Hypertensive heart disease with heart failure: Secondary | ICD-10-CM | POA: Diagnosis not present

## 2023-06-01 DIAGNOSIS — J439 Emphysema, unspecified: Secondary | ICD-10-CM | POA: Diagnosis not present

## 2023-06-01 DIAGNOSIS — I10 Essential (primary) hypertension: Secondary | ICD-10-CM | POA: Diagnosis not present

## 2023-06-01 DIAGNOSIS — I4891 Unspecified atrial fibrillation: Secondary | ICD-10-CM | POA: Diagnosis not present

## 2023-06-01 DIAGNOSIS — E1129 Type 2 diabetes mellitus with other diabetic kidney complication: Secondary | ICD-10-CM | POA: Diagnosis not present

## 2023-06-01 DIAGNOSIS — D72829 Elevated white blood cell count, unspecified: Secondary | ICD-10-CM | POA: Diagnosis not present

## 2023-06-01 DIAGNOSIS — E876 Hypokalemia: Secondary | ICD-10-CM | POA: Diagnosis not present

## 2023-06-01 DIAGNOSIS — R7989 Other specified abnormal findings of blood chemistry: Secondary | ICD-10-CM | POA: Diagnosis not present

## 2023-06-01 DIAGNOSIS — R0600 Dyspnea, unspecified: Secondary | ICD-10-CM | POA: Diagnosis not present

## 2023-06-01 DIAGNOSIS — E119 Type 2 diabetes mellitus without complications: Secondary | ICD-10-CM | POA: Diagnosis not present

## 2023-06-04 ENCOUNTER — Telehealth: Payer: Self-pay | Admitting: Orthopaedic Surgery

## 2023-06-04 ENCOUNTER — Ambulatory Visit: Payer: Medicare Other | Admitting: Cardiovascular Disease

## 2023-06-04 NOTE — Telephone Encounter (Signed)
Patient aware of the below message and will call her PCP about this all

## 2023-06-04 NOTE — Telephone Encounter (Signed)
Patients husband called in regarding wife. Having trouble breathing still. [place] Spaniel its been going on for a while now. Would like call back [phone]

## 2023-06-07 DIAGNOSIS — R809 Proteinuria, unspecified: Secondary | ICD-10-CM | POA: Diagnosis not present

## 2023-06-07 DIAGNOSIS — D72829 Elevated white blood cell count, unspecified: Secondary | ICD-10-CM | POA: Diagnosis not present

## 2023-06-07 DIAGNOSIS — I10 Essential (primary) hypertension: Secondary | ICD-10-CM | POA: Diagnosis not present

## 2023-06-07 DIAGNOSIS — E876 Hypokalemia: Secondary | ICD-10-CM | POA: Diagnosis not present

## 2023-06-07 DIAGNOSIS — E1129 Type 2 diabetes mellitus with other diabetic kidney complication: Secondary | ICD-10-CM | POA: Diagnosis not present

## 2023-06-07 DIAGNOSIS — M179 Osteoarthritis of knee, unspecified: Secondary | ICD-10-CM | POA: Diagnosis not present

## 2023-06-07 DIAGNOSIS — J439 Emphysema, unspecified: Secondary | ICD-10-CM | POA: Diagnosis not present

## 2023-06-07 DIAGNOSIS — R0600 Dyspnea, unspecified: Secondary | ICD-10-CM | POA: Diagnosis not present

## 2023-06-07 DIAGNOSIS — E1149 Type 2 diabetes mellitus with other diabetic neurological complication: Secondary | ICD-10-CM | POA: Diagnosis not present

## 2023-06-07 DIAGNOSIS — N3941 Urge incontinence: Secondary | ICD-10-CM | POA: Diagnosis not present

## 2023-06-07 DIAGNOSIS — R310 Gross hematuria: Secondary | ICD-10-CM | POA: Diagnosis not present

## 2023-06-08 DIAGNOSIS — M1711 Unilateral primary osteoarthritis, right knee: Secondary | ICD-10-CM | POA: Diagnosis not present

## 2023-06-08 DIAGNOSIS — R262 Difficulty in walking, not elsewhere classified: Secondary | ICD-10-CM | POA: Diagnosis not present

## 2023-06-08 DIAGNOSIS — M25561 Pain in right knee: Secondary | ICD-10-CM | POA: Diagnosis not present

## 2023-06-08 DIAGNOSIS — M25661 Stiffness of right knee, not elsewhere classified: Secondary | ICD-10-CM | POA: Diagnosis not present

## 2023-06-13 DIAGNOSIS — I700 Atherosclerosis of aorta: Secondary | ICD-10-CM | POA: Diagnosis not present

## 2023-06-13 DIAGNOSIS — E119 Type 2 diabetes mellitus without complications: Secondary | ICD-10-CM | POA: Diagnosis not present

## 2023-06-13 DIAGNOSIS — I5030 Unspecified diastolic (congestive) heart failure: Secondary | ICD-10-CM | POA: Diagnosis not present

## 2023-06-13 DIAGNOSIS — M1990 Unspecified osteoarthritis, unspecified site: Secondary | ICD-10-CM | POA: Diagnosis not present

## 2023-06-13 DIAGNOSIS — I2510 Atherosclerotic heart disease of native coronary artery without angina pectoris: Secondary | ICD-10-CM | POA: Diagnosis not present

## 2023-06-13 DIAGNOSIS — I872 Venous insufficiency (chronic) (peripheral): Secondary | ICD-10-CM | POA: Diagnosis not present

## 2023-06-13 DIAGNOSIS — M170 Bilateral primary osteoarthritis of knee: Secondary | ICD-10-CM | POA: Diagnosis not present

## 2023-06-13 DIAGNOSIS — Z471 Aftercare following joint replacement surgery: Secondary | ICD-10-CM | POA: Diagnosis not present

## 2023-06-13 DIAGNOSIS — D6859 Other primary thrombophilia: Secondary | ICD-10-CM | POA: Diagnosis not present

## 2023-06-13 DIAGNOSIS — J449 Chronic obstructive pulmonary disease, unspecified: Secondary | ICD-10-CM | POA: Diagnosis not present

## 2023-06-13 DIAGNOSIS — I110 Hypertensive heart disease with heart failure: Secondary | ICD-10-CM | POA: Diagnosis not present

## 2023-06-13 DIAGNOSIS — I4891 Unspecified atrial fibrillation: Secondary | ICD-10-CM | POA: Diagnosis not present

## 2023-06-20 DIAGNOSIS — M25562 Pain in left knee: Secondary | ICD-10-CM | POA: Diagnosis not present

## 2023-06-20 DIAGNOSIS — Z96652 Presence of left artificial knee joint: Secondary | ICD-10-CM | POA: Diagnosis not present

## 2023-06-21 DIAGNOSIS — Z96652 Presence of left artificial knee joint: Secondary | ICD-10-CM | POA: Diagnosis not present

## 2023-06-21 DIAGNOSIS — M25562 Pain in left knee: Secondary | ICD-10-CM | POA: Diagnosis not present

## 2023-06-22 DIAGNOSIS — M25661 Stiffness of right knee, not elsewhere classified: Secondary | ICD-10-CM | POA: Diagnosis not present

## 2023-06-22 DIAGNOSIS — R262 Difficulty in walking, not elsewhere classified: Secondary | ICD-10-CM | POA: Diagnosis not present

## 2023-06-22 DIAGNOSIS — M1711 Unilateral primary osteoarthritis, right knee: Secondary | ICD-10-CM | POA: Diagnosis not present

## 2023-06-22 DIAGNOSIS — M25561 Pain in right knee: Secondary | ICD-10-CM | POA: Diagnosis not present

## 2023-06-25 DIAGNOSIS — I129 Hypertensive chronic kidney disease with stage 1 through stage 4 chronic kidney disease, or unspecified chronic kidney disease: Secondary | ICD-10-CM | POA: Diagnosis not present

## 2023-06-25 DIAGNOSIS — R809 Proteinuria, unspecified: Secondary | ICD-10-CM | POA: Diagnosis not present

## 2023-06-25 DIAGNOSIS — E876 Hypokalemia: Secondary | ICD-10-CM | POA: Diagnosis not present

## 2023-06-25 DIAGNOSIS — E1122 Type 2 diabetes mellitus with diabetic chronic kidney disease: Secondary | ICD-10-CM | POA: Diagnosis not present

## 2023-06-25 DIAGNOSIS — N181 Chronic kidney disease, stage 1: Secondary | ICD-10-CM | POA: Diagnosis not present

## 2023-06-26 DIAGNOSIS — Z96652 Presence of left artificial knee joint: Secondary | ICD-10-CM | POA: Diagnosis not present

## 2023-06-26 DIAGNOSIS — M25562 Pain in left knee: Secondary | ICD-10-CM | POA: Diagnosis not present

## 2023-06-27 DIAGNOSIS — M25562 Pain in left knee: Secondary | ICD-10-CM | POA: Diagnosis not present

## 2023-06-27 DIAGNOSIS — Z96652 Presence of left artificial knee joint: Secondary | ICD-10-CM | POA: Diagnosis not present

## 2023-06-28 ENCOUNTER — Ambulatory Visit (INDEPENDENT_AMBULATORY_CARE_PROVIDER_SITE_OTHER): Payer: Medicare Other | Admitting: Orthopaedic Surgery

## 2023-06-28 ENCOUNTER — Encounter: Payer: Self-pay | Admitting: Orthopaedic Surgery

## 2023-06-28 DIAGNOSIS — Z96652 Presence of left artificial knee joint: Secondary | ICD-10-CM

## 2023-06-28 NOTE — Progress Notes (Signed)
The patient is now 6 weeks status post a left total knee arthroplasty.  She is 72 years old.  She is still in physical therapy and working on her range of motion and strengthening which she said is improving each day and each week.  She is not taking any pain medications and said she does not really need any.  She is walking without an assistive device as well.  On exam she lacks full extension by just a few degrees and I can flex her to past 95 degrees.  It was painful to do so but she was able to accomplish that and overall think the knee looks good thus far.  From my standpoint she will continue to increase her activities as comfort allows.  The next time we need to see her in 3 months.  At that visit we will have a standing AP and lateral of her left operative knee.  If there are issues before then she knows to reach out and let us know.

## 2023-06-29 DIAGNOSIS — E1129 Type 2 diabetes mellitus with other diabetic kidney complication: Secondary | ICD-10-CM | POA: Diagnosis not present

## 2023-06-29 DIAGNOSIS — M179 Osteoarthritis of knee, unspecified: Secondary | ICD-10-CM | POA: Diagnosis not present

## 2023-06-29 DIAGNOSIS — R262 Difficulty in walking, not elsewhere classified: Secondary | ICD-10-CM | POA: Diagnosis not present

## 2023-06-29 DIAGNOSIS — R310 Gross hematuria: Secondary | ICD-10-CM | POA: Diagnosis not present

## 2023-06-29 DIAGNOSIS — I10 Essential (primary) hypertension: Secondary | ICD-10-CM | POA: Diagnosis not present

## 2023-06-29 DIAGNOSIS — R0789 Other chest pain: Secondary | ICD-10-CM | POA: Diagnosis not present

## 2023-06-29 DIAGNOSIS — D72829 Elevated white blood cell count, unspecified: Secondary | ICD-10-CM | POA: Diagnosis not present

## 2023-06-29 DIAGNOSIS — E1149 Type 2 diabetes mellitus with other diabetic neurological complication: Secondary | ICD-10-CM | POA: Diagnosis not present

## 2023-06-29 DIAGNOSIS — R0600 Dyspnea, unspecified: Secondary | ICD-10-CM | POA: Diagnosis not present

## 2023-06-29 DIAGNOSIS — R300 Dysuria: Secondary | ICD-10-CM | POA: Diagnosis not present

## 2023-06-29 DIAGNOSIS — M1711 Unilateral primary osteoarthritis, right knee: Secondary | ICD-10-CM | POA: Diagnosis not present

## 2023-06-29 DIAGNOSIS — M25661 Stiffness of right knee, not elsewhere classified: Secondary | ICD-10-CM | POA: Diagnosis not present

## 2023-06-29 DIAGNOSIS — R809 Proteinuria, unspecified: Secondary | ICD-10-CM | POA: Diagnosis not present

## 2023-06-29 DIAGNOSIS — M25561 Pain in right knee: Secondary | ICD-10-CM | POA: Diagnosis not present

## 2023-06-29 DIAGNOSIS — J439 Emphysema, unspecified: Secondary | ICD-10-CM | POA: Diagnosis not present

## 2023-07-02 DIAGNOSIS — M25562 Pain in left knee: Secondary | ICD-10-CM | POA: Diagnosis not present

## 2023-07-02 DIAGNOSIS — Z96652 Presence of left artificial knee joint: Secondary | ICD-10-CM | POA: Diagnosis not present

## 2023-07-05 ENCOUNTER — Ambulatory Visit
Admission: RE | Admit: 2023-07-05 | Discharge: 2023-07-05 | Disposition: A | Discharge status: Home / Self Care | Payer: Medicare Other | Source: Ambulatory Visit | Attending: Pulmonary Disease | Admitting: Pulmonary Disease

## 2023-07-05 DIAGNOSIS — J432 Centrilobular emphysema: Secondary | ICD-10-CM | POA: Diagnosis not present

## 2023-07-05 DIAGNOSIS — R911 Solitary pulmonary nodule: Secondary | ICD-10-CM | POA: Insufficient documentation

## 2023-07-05 DIAGNOSIS — R918 Other nonspecific abnormal finding of lung field: Secondary | ICD-10-CM | POA: Diagnosis not present

## 2023-07-06 DIAGNOSIS — Z96652 Presence of left artificial knee joint: Secondary | ICD-10-CM | POA: Diagnosis not present

## 2023-07-06 DIAGNOSIS — M1711 Unilateral primary osteoarthritis, right knee: Secondary | ICD-10-CM | POA: Diagnosis not present

## 2023-07-06 DIAGNOSIS — M25661 Stiffness of right knee, not elsewhere classified: Secondary | ICD-10-CM | POA: Diagnosis not present

## 2023-07-06 DIAGNOSIS — M25561 Pain in right knee: Secondary | ICD-10-CM | POA: Diagnosis not present

## 2023-07-06 DIAGNOSIS — R262 Difficulty in walking, not elsewhere classified: Secondary | ICD-10-CM | POA: Diagnosis not present

## 2023-07-06 DIAGNOSIS — M25562 Pain in left knee: Secondary | ICD-10-CM | POA: Diagnosis not present

## 2023-07-10 DIAGNOSIS — Z96652 Presence of left artificial knee joint: Secondary | ICD-10-CM | POA: Diagnosis not present

## 2023-07-10 DIAGNOSIS — M25562 Pain in left knee: Secondary | ICD-10-CM | POA: Diagnosis not present

## 2023-07-12 DIAGNOSIS — M170 Bilateral primary osteoarthritis of knee: Secondary | ICD-10-CM | POA: Diagnosis not present

## 2023-07-13 DIAGNOSIS — Z96652 Presence of left artificial knee joint: Secondary | ICD-10-CM | POA: Diagnosis not present

## 2023-07-13 DIAGNOSIS — M25562 Pain in left knee: Secondary | ICD-10-CM | POA: Diagnosis not present

## 2023-07-19 ENCOUNTER — Ambulatory Visit (INDEPENDENT_AMBULATORY_CARE_PROVIDER_SITE_OTHER): Payer: Medicare Other | Admitting: Pulmonary Disease

## 2023-07-19 ENCOUNTER — Telehealth: Payer: Self-pay

## 2023-07-19 ENCOUNTER — Encounter: Payer: Self-pay | Admitting: Pulmonary Disease

## 2023-07-19 VITALS — BP 122/80 | HR 71 | Temp 97.1°F | Ht 60.0 in | Wt 180.0 lb

## 2023-07-19 DIAGNOSIS — I480 Paroxysmal atrial fibrillation: Secondary | ICD-10-CM | POA: Diagnosis not present

## 2023-07-19 DIAGNOSIS — J449 Chronic obstructive pulmonary disease, unspecified: Secondary | ICD-10-CM | POA: Diagnosis not present

## 2023-07-19 DIAGNOSIS — G4733 Obstructive sleep apnea (adult) (pediatric): Secondary | ICD-10-CM | POA: Diagnosis not present

## 2023-07-19 DIAGNOSIS — R911 Solitary pulmonary nodule: Secondary | ICD-10-CM | POA: Diagnosis not present

## 2023-07-19 DIAGNOSIS — E66813 Obesity, class 3: Secondary | ICD-10-CM

## 2023-07-19 NOTE — H&P (View-Only) (Signed)
 Subjective:    Patient ID: Tammy [name], female    DOB: 1951/03/04, 73 y.o.   MRN: [id number]  Patient Care Team: Loreli Elsie [name], MD as PCP - General (Internal Medicine) Lavona Agent, MD as PCP - Cardiology (Cardiology) Verdene Gills, RN as Oncology Nurse Navigator Tamea Dedra CROME, MD as Consulting Physician (Pulmonary Disease)  Chief Complaint  Patient presents with   Follow-up    No SOB, wheezing or cough.     BACKGROUND/INTERVAL:Tammy [name] is a 73 year old former smoker (quit 2010, 30 PY) who presents for follow-up on the issue of a left lower lobe nodule noted on LDCT previously.  She was initially evaluated on 20 December 2021.  She was last seen on 21 Nov 2022 and at that time was vies to continue on Trelegy Ellipta  for stage II COPD.  A Nodify Lung ([employer]) assay was then collected and sent.  The results of the assay showed that the risk assessment for the nodule in question is 11%.  Patient had robotic assisted navigational bronchoscopy on 29 December 2022 that was inconclusive.  HPI Discussed the use of AI scribe software for clinical note transcription with the patient, who gave verbal consent to proceed.  History of Present Illness   The patient, with a known diagnosis of COPD and a lung nodule, reports overall good health. She has been managing her COPD with Trelegy and has had to use a rescue inhaler only once since the last visit. This incident occurred post knee replacement surgery when she experienced difficulty breathing, which was later attributed to a UTI. The patient was treated with antibiotics and has not required the use of the rescue inhaler since then.  The patient also reports a recent knee replacement surgery, which has been followed by physical therapy. She reports good progress with her knee recovery.  [name] lung nodule, located in the left lobe, has been a point of concern. A previous bronchoscopy was inconclusive, and the most recent CT scan  suggests the nodule is enlarging and becoming denser.  Discussed in detail with the patient.  Films were shown to her.  Patient can appreciate nodule enlarging.  Discussed next steps she prefers to go for repeat biopsy.   DATA 10/26/2021 chest LDCT: Left lower lobe pulmonary nodule main diameter of 1.5 cm, poorly defined. 11/09/2021 PET/CT: No signs of hypermetabolic activity on the nodule in question cannot exclude indolent bronchogenic neoplasm, 12-month follow-up chest CT. 03/06/2022 chest CT: Persistent vague area of nodularity unchanged from prior, recommend follow-up CT 6 months. 04/20/2022 PFTs: FEV1 1.48 L or 77% predicted, FVC 2.32 L or 91% predicted, FEV1/FVC 64%, lung volumes normal with mild hyperinflation noted.  Bronchodilator response.  Diffusion capacity normal.  Consistent with moderate obstruction. 11/13/2022 chest CT: Persistent subsolid nodule 2.2 x 0.7 cm with 1.5 cm solid component.  Unchanged overall size, possible increased density.  Coronary calcifications noted 12/29/2022 robotic assisted navigational bronchoscopy: Atypical cells noted on biopsy specimen, inconclusive. 05/01/2023 PFTs: FEV1 1.34 L or 71% predicted, FVC 2.75 L or 110% predicted, FEV1/FVC 49%, there is mild hyperinflation and air trapping.  Diffusion capacity normal.  Consistent with moderate to severe obstructive lung disease. 07/05/2023 CT chest without contrast: Formal radiology interpretation not available at the time of visit with the patient.  Subsolid left lower lobe nodule has enlarged compared to prior.  Emphysematous changes  Review of Systems A 10 point review of systems was performed and it is as noted above otherwise negative.   Patient  Active Problem List   Diagnosis Date Noted   Status post total left knee replacement 05/18/2023   Unstable angina (HCC) 04/20/2023   Other chest pain 12/08/2022   COPD suggested by initial evaluation (HCC) 04/20/2022   Nodule of lower lobe of left lung  11/11/2021   Fatigue 12/30/2020   Elevated coronary artery calcium score 12/30/2020   Unilateral primary osteoarthritis, left knee 10/06/2020   Respiratory failure, acute (HCC) 06/28/2020   Acute diastolic CHF (congestive heart failure) (HCC) 06/28/2020   Atrial fibrillation with rapid ventricular response (HCC) 06/28/2020   Acquired thrombophilia (HCC)    Depression    Atrial fibrillation with RVR (HCC) 06/16/2020   Diabetes mellitus (HCC)    HTN (hypertension)    Sleep apnea    Obesity, Class III, BMI 40-49.9 (morbid obesity) (HCC)    Chronic venous insufficiency 12/30/2018   Varicose veins of both lower extremities with inflammation 12/30/2018   DJD (degenerative joint disease) 12/30/2018   Snoring 11/27/2018   Daytime sleepiness 11/27/2018   Educated about COVID-19 virus infection 11/27/2018   SOB (shortness of breath) 11/27/2018   Hyperlipidemia 05/20/2015   Knee pain 06/06/2012    Social History   Tobacco Use   Smoking status: Former    Current packs/day: 0.00    Average packs/day: 1 pack/day for 30.0 years (30.0 ttl pk-yrs)    Types: Cigarettes    Start date: 11/02/1978    Quit date: 11/01/2008    Years since quitting: 14.7   Smokeless tobacco: Never  Substance Use Topics   Alcohol  use: No    Alcohol /week: 0.0 standard drinks of alcohol     Allergies  Allergen Reactions   Betadine [Povidone Iodine] Anaphylaxis   Contrast Media [Iodinated Contrast Media] Anaphylaxis   Iodine Anaphylaxis   Metrizamide Anaphylaxis   Povidone-Iodine Anaphylaxis   Shellfish Allergy Anaphylaxis   Hydrocodone -Acetaminophen  Nausea Only    Current Meds  Medication Sig   albuterol  (VENTOLIN  HFA) 108 (90 Base) MCG/ACT inhaler Inhale 2 puffs into the lungs every 6 (six) hours as needed.   apixaban  (ELIQUIS ) 5 MG TABS tablet Take 5 mg by mouth 2 (two) times daily.   beta carotene w/minerals (OCUVITE) tablet Take 1 tablet by mouth daily.   diltiazem  (CARDIZEM  CD) 240 MG 24 hr capsule  Take 1 capsule (240 mg total) by mouth daily.   DULoxetine  (CYMBALTA ) 60 MG capsule Take 60 mg by mouth daily.   EPINEPHrine  0.3 mg/0.3 mL IJ SOAJ injection Inject 0.3 mg into the muscle as needed for anaphylaxis.   Evolocumab (REPATHA SURECLICK) 140 MG/ML SOAJ Inject 140 mg into the skin every 14 (fourteen) days.   fesoterodine  (TOVIAZ ) 4 MG TB24 tablet Take 4 mg by mouth daily.   Fluticasone -Umeclidin-Vilant (TRELEGY ELLIPTA ) 100-62.5-25 MCG/ACT AEPB Inhale 1 Dose into the lungs daily.   metFORMIN  (GLUCOPHAGE ) 500 MG tablet Take 500 mg by mouth 2 (two) times daily.   oxyCODONE  (OXY IR/ROXICODONE ) 5 MG immediate release tablet Take 1-2 tablets (5-10 mg total) by mouth every 6 (six) hours as needed for moderate pain (pain score 4-6) (pain score 4-6).   sotalol  (BETAPACE ) 80 MG tablet Take 1 tablet (80 mg total) by mouth every 12 (twelve) hours.   tirzepatide (MOUNJARO) 10 MG/0.5ML Pen Inject 10 mg into the skin every Tuesday.   tiZANidine  (ZANAFLEX ) 2 MG tablet Take 1 tablet (2 mg total) by mouth every 6 (six) hours as needed for muscle spasms.   valACYclovir  (VALTREX ) 1000 MG tablet Take 1,000 mg by mouth daily.  valsartan  (DIOVAN ) 80 MG tablet Take 1 tablet (80 mg total) by mouth daily.   Vibegron (GEMTESA) 75 MG TABS Take 1 tablet by mouth daily.    Immunization History  Administered Date(s) Administered   Influenza-Unspecified 03/21/2021   PFIZER Comirnaty(Gray Top)Covid-19 Tri-Sucrose Vaccine 10/03/2019, 10/31/2019   PFIZER(Purple Top)SARS-COV-2 Vaccination 05/23/2021   Pneumococcal Polysaccharide-23 09/12/2018   Tdap 06/14/2022   Zoster, Live 10/10/2016, 02/04/2017      Objective:     BP 122/80 (BP Location: Right Arm, Cuff Size: Normal)   Pulse 71   Temp (!) 97.1 F (36.2 C)   Ht 5' (1.524 m)   Wt 180 lb (81.6 kg)   SpO2 97%   BMI 35.15 kg/m   SpO2: 97 % O2 Device: None (Room air)  GENERAL: Morbidly obese woman, no acute distress, fully ambulatory.  No conversational  dyspnea. HEAD: Normocephalic, atraumatic.  EYES: Pupils equal, round, reactive to light.  No scleral icterus.  MOUTH: No prosthesis, few chipped teeth.  Oral mucosa moist.  No thrush. NECK: Supple. No thyromegaly. Trachea midline. No JVD.  No adenopathy. PULMONARY: Good air entry bilaterally.  No adventitious sounds. CARDIOVASCULAR: S1 and S2. Regular rate and rhythm.  No rubs, murmurs or gallops heard.. ABDOMEN: Obese, otherwise benign. MUSCULOSKELETAL: No joint deformity, no clubbing, trace lower extremity edema.  NEUROLOGIC: Grossly nonfocal, gait slow.  Speech is fluent. SKIN: Intact,warm,dry.  Multiple varicosities lower extremities, mild stasis changes. PSYCH: Mood and behavior normal.   Representative image from the CT performed 05 July 2023 showing the left lower lobe nodule in question.  Formal radiology interpretation pending:       Assessment & Plan:     ICD-10-CM   1. Nodule of lower lobe of left lung  R91.1 CT SUPER D CHEST WO MONARCH PILOT    2. Stage 2 moderate COPD by GOLD classification (HCC)  J44.9     3. PAF (paroxysmal atrial fibrillation) (HCC)  I48.0     4. Obesity, Class III, BMI 40-49.9 (morbid obesity) (HCC)  E66.01     5. OSA on CPAP  G47.33       Orders Placed This Encounter  Procedures   CT SUPER D CHEST WO MONARCH PILOT    Standing Status:   Future    Expiration Date:   07/18/2024    Scheduling Instructions:     Please do before 01 August 2023.  Patient has robotic assisted navigational bronchoscopy on 15 January.    Preferred imaging location?:   [facility]   Discussion:    Lung Nodule Lung nodule in the left lobe is enlarging and becoming denser based on the CT scan from July 05, 2023. Previous bronchoscopy in June was inconclusive (atypical cells only). Differential diagnosis includes slow-growing cancer. Discussed benefits, limitations, and complications of a repeat biopsy, including reactive changes and inconclusive  results. Patient agreed to proceed. - Schedule bronchoscopy on August 01, 2023, at 12:30 PM  Chronic Obstructive Pulmonary Disease (COPD) COPD managed with Trelegy. Patient used a rescue inhaler during recent hospitalization for UTI but has not needed it since completing antibiotics. Doing well with physical therapy post-knee replacement surgery. - Continue Trelegy - Follow up in 4-6 weeks.   Paroxysmal Atrial Fibrillation Patient on Eliquis  5 mg twice daily.  Instructed to stop Eliquis  3 days prior to procedure.      Advised if symptoms do not improve or worsen, to please contact office for sooner follow up or seek emergency care.    I spent  40 minutes of dedicated to the care of this patient on the date of this encounter to include pre-visit review of records, face-to-face time with the patient discussing conditions above, post visit ordering of testing, clinical documentation with the electronic health record, making appropriate referrals as documented, and communicating necessary findings to members of the patients care team.     C. Leita Sanders, MD Advanced Bronchoscopy [facility]    *This note was generated using voice recognition software/Dragon and/or AI transcription program.  Despite best efforts to proofread, errors can occur which can change the meaning. Any transcriptional errors that result from this process are unintentional and may not be fully corrected at the time of dictation.

## 2023-07-19 NOTE — Progress Notes (Signed)
 Subjective:    Patient ID: Tammy [name], female    DOB: 1951/03/04, 73 y.o.   MRN: [id number]  Patient Care Team: Loreli Elsie [name], MD as PCP - General (Internal Medicine) Lavona Agent, MD as PCP - Cardiology (Cardiology) Verdene Gills, RN as Oncology Nurse Navigator Tamea Dedra CROME, MD as Consulting Physician (Pulmonary Disease)  Chief Complaint  Patient presents with   Follow-up    No SOB, wheezing or cough.     BACKGROUND/INTERVAL:[name] is a 73 year old former smoker (quit 2010, 30 PY) who presents for follow-up on the issue of a left lower lobe nodule noted on LDCT previously.  She was initially evaluated on 20 December 2021.  She was last seen on 21 Nov 2022 and at that time was vies to continue on Trelegy Ellipta  for stage II COPD.  A Nodify Lung ([employer]) assay was then collected and sent.  The results of the assay showed that the risk assessment for the nodule in question is 11%.  Patient had robotic assisted navigational bronchoscopy on 29 December 2022 that was inconclusive.  HPI Discussed the use of AI scribe software for clinical note transcription with the patient, who gave verbal consent to proceed.  History of Present Illness   The patient, with a known diagnosis of COPD and a lung nodule, reports overall good health. She has been managing her COPD with Trelegy and has had to use a rescue inhaler only once since the last visit. This incident occurred post knee replacement surgery when she experienced difficulty breathing, which was later attributed to a UTI. The patient was treated with antibiotics and has not required the use of the rescue inhaler since then.  The patient also reports a recent knee replacement surgery, which has been followed by physical therapy. She reports good progress with her knee recovery.  [name] lung nodule, located in the left lobe, has been a point of concern. A previous bronchoscopy was inconclusive, and the most recent CT scan  suggests the nodule is enlarging and becoming denser.  Discussed in detail with the patient.  Films were shown to her.  Patient can appreciate nodule enlarging.  Discussed next steps she prefers to go for repeat biopsy.   DATA 10/26/2021 chest LDCT: Left lower lobe pulmonary nodule main diameter of 1.5 cm, poorly defined. 11/09/2021 PET/CT: No signs of hypermetabolic activity on the nodule in question cannot exclude indolent bronchogenic neoplasm, 12-month follow-up chest CT. 03/06/2022 chest CT: Persistent vague area of nodularity unchanged from prior, recommend follow-up CT 6 months. 04/20/2022 PFTs: FEV1 1.48 L or 77% predicted, FVC 2.32 L or 91% predicted, FEV1/FVC 64%, lung volumes normal with mild hyperinflation noted.  Bronchodilator response.  Diffusion capacity normal.  Consistent with moderate obstruction. 11/13/2022 chest CT: Persistent subsolid nodule 2.2 x 0.7 cm with 1.5 cm solid component.  Unchanged overall size, possible increased density.  Coronary calcifications noted 12/29/2022 robotic assisted navigational bronchoscopy: Atypical cells noted on biopsy specimen, inconclusive. 05/01/2023 PFTs: FEV1 1.34 L or 71% predicted, FVC 2.75 L or 110% predicted, FEV1/FVC 49%, there is mild hyperinflation and air trapping.  Diffusion capacity normal.  Consistent with moderate to severe obstructive lung disease. 07/05/2023 CT chest without contrast: Formal radiology interpretation not available at the time of visit with the patient.  Subsolid left lower lobe nodule has enlarged compared to prior.  Emphysematous changes  Review of Systems A 10 point review of systems was performed and it is as noted above otherwise negative.   Patient  Active Problem List   Diagnosis Date Noted   Status post total left knee replacement 05/18/2023   Unstable angina (HCC) 04/20/2023   Other chest pain 12/08/2022   COPD suggested by initial evaluation (HCC) 04/20/2022   Nodule of lower lobe of left lung  11/11/2021   Fatigue 12/30/2020   Elevated coronary artery calcium score 12/30/2020   Unilateral primary osteoarthritis, left knee 10/06/2020   Respiratory failure, acute (HCC) 06/28/2020   Acute diastolic CHF (congestive heart failure) (HCC) 06/28/2020   Atrial fibrillation with rapid ventricular response (HCC) 06/28/2020   Acquired thrombophilia (HCC)    Depression    Atrial fibrillation with RVR (HCC) 06/16/2020   Diabetes mellitus (HCC)    HTN (hypertension)    Sleep apnea    Obesity, Class III, BMI 40-49.9 (morbid obesity) (HCC)    Chronic venous insufficiency 12/30/2018   Varicose veins of both lower extremities with inflammation 12/30/2018   DJD (degenerative joint disease) 12/30/2018   Snoring 11/27/2018   Daytime sleepiness 11/27/2018   Educated about COVID-19 virus infection 11/27/2018   SOB (shortness of breath) 11/27/2018   Hyperlipidemia 05/20/2015   Knee pain 06/06/2012    Social History   Tobacco Use   Smoking status: Former    Current packs/day: 0.00    Average packs/day: 1 pack/day for 30.0 years (30.0 ttl pk-yrs)    Types: Cigarettes    Start date: 11/02/1978    Quit date: 11/01/2008    Years since quitting: 14.7   Smokeless tobacco: Never  Substance Use Topics   Alcohol  use: No    Alcohol /week: 0.0 standard drinks of alcohol     Allergies  Allergen Reactions   Betadine [Povidone Iodine] Anaphylaxis   Contrast Media [Iodinated Contrast Media] Anaphylaxis   Iodine Anaphylaxis   Metrizamide Anaphylaxis   Povidone-Iodine Anaphylaxis   Shellfish Allergy Anaphylaxis   Hydrocodone -Acetaminophen  Nausea Only    Current Meds  Medication Sig   albuterol  (VENTOLIN  HFA) 108 (90 Base) MCG/ACT inhaler Inhale 2 puffs into the lungs every 6 (six) hours as needed.   apixaban  (ELIQUIS ) 5 MG TABS tablet Take 5 mg by mouth 2 (two) times daily.   beta carotene w/minerals (OCUVITE) tablet Take 1 tablet by mouth daily.   diltiazem  (CARDIZEM  CD) 240 MG 24 hr capsule  Take 1 capsule (240 mg total) by mouth daily.   DULoxetine  (CYMBALTA ) 60 MG capsule Take 60 mg by mouth daily.   EPINEPHrine  0.3 mg/0.3 mL IJ SOAJ injection Inject 0.3 mg into the muscle as needed for anaphylaxis.   Evolocumab (REPATHA SURECLICK) 140 MG/ML SOAJ Inject 140 mg into the skin every 14 (fourteen) days.   fesoterodine  (TOVIAZ ) 4 MG TB24 tablet Take 4 mg by mouth daily.   Fluticasone -Umeclidin-Vilant (TRELEGY ELLIPTA ) 100-62.5-25 MCG/ACT AEPB Inhale 1 Dose into the lungs daily.   metFORMIN  (GLUCOPHAGE ) 500 MG tablet Take 500 mg by mouth 2 (two) times daily.   oxyCODONE  (OXY IR/ROXICODONE ) 5 MG immediate release tablet Take 1-2 tablets (5-10 mg total) by mouth every 6 (six) hours as needed for moderate pain (pain score 4-6) (pain score 4-6).   sotalol  (BETAPACE ) 80 MG tablet Take 1 tablet (80 mg total) by mouth every 12 (twelve) hours.   tirzepatide (MOUNJARO) 10 MG/0.5ML Pen Inject 10 mg into the skin every Tuesday.   tiZANidine  (ZANAFLEX ) 2 MG tablet Take 1 tablet (2 mg total) by mouth every 6 (six) hours as needed for muscle spasms.   valACYclovir  (VALTREX ) 1000 MG tablet Take 1,000 mg by mouth daily.  valsartan  (DIOVAN ) 80 MG tablet Take 1 tablet (80 mg total) by mouth daily.   Vibegron (GEMTESA) 75 MG TABS Take 1 tablet by mouth daily.    Immunization History  Administered Date(s) Administered   Influenza-Unspecified 03/21/2021   PFIZER Comirnaty(Gray Top)Covid-19 Tri-Sucrose Vaccine 10/03/2019, 10/31/2019   PFIZER(Purple Top)SARS-COV-2 Vaccination 05/23/2021   Pneumococcal Polysaccharide-23 09/12/2018   Tdap 06/14/2022   Zoster, Live 10/10/2016, 02/04/2017      Objective:     BP 122/80 (BP Location: Right Arm, Cuff Size: Normal)   Pulse 71   Temp (!) 97.1 F (36.2 C)   Ht 5' (1.524 m)   Wt 180 lb (81.6 kg)   SpO2 97%   BMI 35.15 kg/m   SpO2: 97 % O2 Device: None (Room air)  GENERAL: Morbidly obese woman, no acute distress, fully ambulatory.  No conversational  dyspnea. HEAD: Normocephalic, atraumatic.  EYES: Pupils equal, round, reactive to light.  No scleral icterus.  MOUTH: No prosthesis, few chipped teeth.  Oral mucosa moist.  No thrush. NECK: Supple. No thyromegaly. Trachea midline. No JVD.  No adenopathy. PULMONARY: Good air entry bilaterally.  No adventitious sounds. CARDIOVASCULAR: S1 and S2. Regular rate and rhythm.  No rubs, murmurs or gallops heard.. ABDOMEN: Obese, otherwise benign. MUSCULOSKELETAL: No joint deformity, no clubbing, trace lower extremity edema.  NEUROLOGIC: Grossly nonfocal, gait slow.  Speech is fluent. SKIN: Intact,warm,dry.  Multiple varicosities lower extremities, mild stasis changes. PSYCH: Mood and behavior normal.   Representative image from the CT performed 05 July 2023 showing the left lower lobe nodule in question.  Formal radiology interpretation pending:       Assessment & Plan:     ICD-10-CM   1. Nodule of lower lobe of left lung  R91.1 CT SUPER D CHEST WO MONARCH PILOT    2. Stage 2 moderate COPD by GOLD classification (HCC)  J44.9     3. PAF (paroxysmal atrial fibrillation) (HCC)  I48.0     4. Obesity, Class III, BMI 40-49.9 (morbid obesity) (HCC)  E66.01     5. OSA on CPAP  G47.33       Orders Placed This Encounter  Procedures   CT SUPER D CHEST WO MONARCH PILOT    Standing Status:   Future    Expiration Date:   07/18/2024    Scheduling Instructions:     Please do before 01 August 2023.  Patient has robotic assisted navigational bronchoscopy on 15 January.    Preferred imaging location?:   [facility]   Discussion:    Lung Nodule Lung nodule in the left lobe is enlarging and becoming denser based on the CT scan from July 05, 2023. Previous bronchoscopy in June was inconclusive (atypical cells only). Differential diagnosis includes slow-growing cancer. Discussed benefits, limitations, and complications of a repeat biopsy, including reactive changes and inconclusive  results. Patient agreed to proceed. - Schedule bronchoscopy on August 01, 2023, at 12:30 PM  Chronic Obstructive Pulmonary Disease (COPD) COPD managed with Trelegy. Patient used a rescue inhaler during recent hospitalization for UTI but has not needed it since completing antibiotics. Doing well with physical therapy post-knee replacement surgery. - Continue Trelegy - Follow up in 4-6 weeks.   Paroxysmal Atrial Fibrillation Patient on Eliquis  5 mg twice daily.  Instructed to stop Eliquis  3 days prior to procedure.      Advised if symptoms do not improve or worsen, to please contact office for sooner follow up or seek emergency care.    I spent  40 minutes of dedicated to the care of this patient on the date of this encounter to include pre-visit review of records, face-to-face time with the patient discussing conditions above, post visit ordering of testing, clinical documentation with the electronic health record, making appropriate referrals as documented, and communicating necessary findings to members of the patients care team.     C. Leita Sanders, MD Advanced Bronchoscopy PCCM [facility]    *This note was generated using voice recognition software/Dragon and/or AI transcription program.  Despite best efforts to proofread, errors can occur which can change the meaning. Any transcriptional errors that result from this process are unintentional and may not be fully corrected at the time of dictation.

## 2023-07-19 NOTE — Telephone Encounter (Signed)
 Pre-Admit appt- 07/26/2023 between 8a-1pm.  Patient is aware.

## 2023-07-19 NOTE — Telephone Encounter (Signed)
Patient has Medicare A & B and [employer] Prior Auth Not Required

## 2023-07-19 NOTE — Telephone Encounter (Signed)
 Robotic Bronchoscopy 08/01/2023 at 12:30pm [id number] Left Lower Lobe Lung Nodule  Synetta Fail please see Bronch info.

## 2023-07-19 NOTE — Telephone Encounter (Signed)
 Noted. Nothing further needed.

## 2023-07-19 NOTE — Patient Instructions (Addendum)
 VISIT SUMMARY:  During today's visit, we discussed your overall good health and management of your COPD, as well as your recent knee replacement surgery and the status of your lung nodule. You have been doing well with your COPD management and knee recovery. However, the lung nodule in your left lobe has shown signs of enlargement and increased density, which requires further investigation.  YOUR PLAN:  -LUNG NODULE: A lung nodule is a small growth in the lung that can be benign or malignant. Your recent CT scan shows that the nodule in your left lobe is getting larger and denser. We discussed the benefits and risks of a repeat biopsy to get more information, and you agreed to proceed. Your bronchoscopy is scheduled for August 01, 2023, at 12:30 PM. STOP ELIQUIS  3 days before procedure.  -CHRONIC OBSTRUCTIVE PULMONARY DISEASE (COPD): COPD is a chronic lung condition that makes it hard to breathe. You have been managing it well with Trelegy and have only needed your rescue inhaler once since your last visit, during a hospitalization for a UTI. Continue using Trelegy as prescribed, and we will follow up in 4-6 weeks to monitor your condition.   -ROBOTIC BRONCHOSCOPY: We discussed that the procedure would have to be done under general anesthesia. The anesthesia team will discuss his part of the process with you.  Complications from the procedure itself are usually minor. One potential complication would be collapse of the lung which can occur in 2-3% of the cases. If  this happens we would have to put a small tube to relieve the collapse and you would have to spend the night in the hospital in that event.  Another possibility would be that of bleeding, this is usually taken care of during the procedure.  In the situations you may need to be observed overnight.  For the most part though, should be able to go home the same day.  Other possibilities could include that the procedure would be nondiagnostic  meaning that no definitive diagnosis could be attained.  We strive to try to decrease these potential issues.   INSTRUCTIONS:  Please remember to attend your scheduled bronchoscopy on August 01, 2023, at 12:30 PM. Continue taking Trelegy for your COPD as prescribed, and we will see you for a follow-up appointment in 4-6 weeks.

## 2023-07-20 DIAGNOSIS — M25562 Pain in left knee: Secondary | ICD-10-CM | POA: Diagnosis not present

## 2023-07-20 DIAGNOSIS — Z96652 Presence of left artificial knee joint: Secondary | ICD-10-CM | POA: Diagnosis not present

## 2023-07-20 DIAGNOSIS — R262 Difficulty in walking, not elsewhere classified: Secondary | ICD-10-CM | POA: Diagnosis not present

## 2023-07-20 DIAGNOSIS — M25561 Pain in right knee: Secondary | ICD-10-CM | POA: Diagnosis not present

## 2023-07-20 DIAGNOSIS — M25661 Stiffness of right knee, not elsewhere classified: Secondary | ICD-10-CM | POA: Diagnosis not present

## 2023-07-20 DIAGNOSIS — M1711 Unilateral primary osteoarthritis, right knee: Secondary | ICD-10-CM | POA: Diagnosis not present

## 2023-07-25 DIAGNOSIS — E1129 Type 2 diabetes mellitus with other diabetic kidney complication: Secondary | ICD-10-CM | POA: Diagnosis not present

## 2023-07-25 DIAGNOSIS — I10 Essential (primary) hypertension: Secondary | ICD-10-CM | POA: Diagnosis not present

## 2023-07-25 DIAGNOSIS — G720 Drug-induced myopathy: Secondary | ICD-10-CM | POA: Diagnosis not present

## 2023-07-25 DIAGNOSIS — I2510 Atherosclerotic heart disease of native coronary artery without angina pectoris: Secondary | ICD-10-CM | POA: Diagnosis not present

## 2023-07-26 ENCOUNTER — Other Ambulatory Visit: Payer: Self-pay

## 2023-07-26 ENCOUNTER — Encounter
Admission: RE | Admit: 2023-07-26 | Discharge: 2023-07-26 | Disposition: A | Discharge status: Home / Self Care | Payer: Medicare Other | Source: Ambulatory Visit | Attending: Pulmonary Disease | Admitting: Pulmonary Disease

## 2023-07-26 DIAGNOSIS — E119 Type 2 diabetes mellitus without complications: Secondary | ICD-10-CM

## 2023-07-26 DIAGNOSIS — Z01812 Encounter for preprocedural laboratory examination: Secondary | ICD-10-CM

## 2023-07-26 HISTORY — DX: Solitary pulmonary nodule: R91.1

## 2023-07-26 NOTE — Patient Instructions (Signed)
 Your procedure is scheduled on: Jan  15/2025 Wednesday.  Report to the Registration Desk on the 1st floor of the [facility]. To find out your arrival time, please call [phone] between 1PM - 3PM on: Tuesday Jul 31, 2023 If your arrival time is 6:00 am, do not arrive before that time as the [facility] entrance doors do not open until 6:00 am.  REMEMBER: Instructions that are not followed completely may result in serious medical risk, up to and including death; or upon the discretion of your surgeon and anesthesiologist your surgery may need to be rescheduled.  Do not eat food after midnight the night before surgery.  No gum chewing or hard candies.    One week prior to surgery: Stop Anti-inflammatories (NSAIDS) such as Advil, Aleve, Ibuprofen, Motrin, Naproxen, Naprosyn and Aspirin  based products such as Excedrin, Goody's Powder, BC Powder. Stop ANY OVER THE COUNTER supplements until after surgery.  You may however, continue to take Tylenol  if needed for pain up until the day of surgery.  **Follow guidelines and diabetes medications.**    Hold metformin  two days prior to surgery. Last dose is Jan 12.  **Follow recommendations regarding stopping blood thinners like Eliquis  which Last dose should be Jan 11         Continue taking all of your other prescription medications up until the day of surgery.  ON THE DAY OF SURGERY ONLY TAKE THESE MEDICATIONS WITH SIPS OF WATER:  diltiazem  (CARDIZEM  CD) DULoxetine  (CYMBALTA ) fesoterodine  (TOVIAZ ) sotalol  (BETAPACE ) valACYclovir  (VALTREX ) Vibegron (GEMTESA)  Use trelegy before leaving the house, on the day of surgery and bring the albuterol  to the hospital.    No Alcohol  for 24 hours before or after surgery.  No Smoking including e-cigarettes for 24 hours before surgery.  No chewable tobacco products for at least 6 hours before surgery.  No nicotine patches on the day of surgery.  Do not use any recreational drugs for at  least a week (preferably 2 weeks) before your surgery.  Please be advised that the combination of cocaine and anesthesia may have negative outcomes, up to and including death. If you test positive for cocaine, your surgery will be cancelled.  On the morning of surgery brush your teeth with toothpaste and water, you may rinse your mouth with mouthwash if you wish. Do not swallow any toothpaste or mouthwash.  Please shower on day of surgery.  Do not wear jewelry, make-up, hairpins, clips or nail polish.  For welded (permanent) jewelry: bracelets, anklets, waist bands, etc.  Please have this removed prior to surgery.  If it is not removed, there is a chance that hospital personnel will need to cut it off on the day of surgery.  Do not wear lotions, powders, or perfumes.   Do not shave body hair from the neck down 48 hours before surgery.  Contact lenses, hearing aids and dentures may not be worn into surgery.  Do not bring valuables to the hospital. [facility] is not responsible for any missing/lost belongings or valuables.    Bring your C-PAP to the hospital in case you may have to spend the night.   Notify your doctor if there is any change in your medical condition (cold, fever, infection).  Wear comfortable clothing (specific to your surgery type) to the hospital.  After surgery, you can help prevent lung complications by doing breathing exercises.  Take deep breaths and cough every 1-2 hours. Your doctor may order a device called an Facilities Manager  to help you take deep breaths.  If you are being admitted to the hospital overnight, leave your suitcase in the car. After surgery it may be brought to your room.  In case of increased patient census, it may be necessary for you, the patient, to continue your postoperative care in the Same Day Surgery department.  If you are being discharged the day of surgery, you will not be allowed to drive home. You will need a responsible  individual to drive you home and stay with you for 24 hours after surgery.    Please call the Pre-admissions Testing Dept. at [phone] if you have any questions about these instructions.  Surgery Visitation Policy:  Patients having surgery or a procedure may have two visitors.  Children under the age of 85 must have an adult with them who is not the patient.  Inpatient Visitation:    Visiting hours are 7 a.m. to 8 p.m. Up to four visitors are allowed at one time in a patient room. The visitors may rotate out with other people during the day.  One visitor age 75 or older may stay with the patient overnight and must be in the room by 8 p.m.

## 2023-07-27 DIAGNOSIS — M1711 Unilateral primary osteoarthritis, right knee: Secondary | ICD-10-CM | POA: Diagnosis not present

## 2023-07-27 DIAGNOSIS — M25561 Pain in right knee: Secondary | ICD-10-CM | POA: Diagnosis not present

## 2023-07-27 DIAGNOSIS — R262 Difficulty in walking, not elsewhere classified: Secondary | ICD-10-CM | POA: Diagnosis not present

## 2023-07-27 DIAGNOSIS — M25661 Stiffness of right knee, not elsewhere classified: Secondary | ICD-10-CM | POA: Diagnosis not present

## 2023-07-30 ENCOUNTER — Ambulatory Visit
Admission: RE | Admit: 2023-07-30 | Discharge: 2023-07-30 | Disposition: A | Discharge status: Home / Self Care | Payer: Medicare Other | Source: Ambulatory Visit | Attending: Pulmonary Disease | Admitting: Pulmonary Disease

## 2023-07-30 DIAGNOSIS — D3501 Benign neoplasm of right adrenal gland: Secondary | ICD-10-CM | POA: Diagnosis not present

## 2023-07-30 DIAGNOSIS — R911 Solitary pulmonary nodule: Secondary | ICD-10-CM | POA: Diagnosis not present

## 2023-07-31 ENCOUNTER — Encounter: Payer: Self-pay | Admitting: Pulmonary Disease

## 2023-07-31 MED ORDER — CHLORHEXIDINE GLUCONATE 0.12 % MT SOLN
15.0000 mL | Freq: Once | OROMUCOSAL | Status: AC
  Administered 2023-08-01: 15 mL via OROMUCOSAL

## 2023-07-31 MED ORDER — SODIUM CHLORIDE 0.9 % IV SOLN: Freq: Once | INTRAVENOUS | Status: DC

## 2023-07-31 MED ORDER — SODIUM CHLORIDE 0.9 % IV SOLN
INTRAVENOUS | Status: DC
Start: 2023-07-31 — End: 2023-08-01

## 2023-07-31 MED ORDER — ORAL CARE MOUTH RINSE: 15.0000 mL | Freq: Once | OROMUCOSAL | Status: AC

## 2023-07-31 NOTE — Progress Notes (Signed)
 Perioperative / Anesthesia Services  Pre-Admission Testing Clinical Review / Pre-Operative Anesthesia Consult  Date: 07/31/23  Patient Demographics:  Name: [name] DOB: [birth year] MRN:   [id number]  Planned Surgical Procedure(s):    Case: [id number] Date/Time: 08/01/23 1215   Procedure: ROBOTIC ASSISTED NAVIGATIONAL BRONCHOSCOPY (Left)   Anesthesia type: General   Pre-op diagnosis: Lung Nodule   Location: [facility] PROCEDURE RM 02 / [facility] ORS FOR ANESTHESIA GROUP   Surgeons: Tamea Dedra CROME, MD      NOTE: Available PAT nursing documentation and vital signs have been reviewed. Clinical nursing staff has updated patient's PMH/PSHx, current medication list, and drug allergies/intolerances to ensure comprehensive history available to assist in medical decision making as it pertains to the aforementioned surgical procedure and anticipated anesthetic course. Extensive review of available clinical information personally performed. [place] PMH and PSHx updated with any diagnoses/procedures that  may have been inadvertently omitted during his intake with the pre-admission testing department's nursing staff.  Clinical Discussion:  Tammy [name] is a 73 y.o. female who is submitted for pre-surgical anesthesia review and clearance prior to her undergoing the above procedure. Patient is a Former Smoker (30 pack years; quit 10/2008). Pertinent PMH includes: CAD, atrial fibrillation, CHF, aortic atherosclerosis, HTN, HLD, T2DM, OSAH (requires nocturnal PAP therapy), COPD, lung nodule, hiatal hernia, nephrolithiasis, DJD, lumbar DDD (s/p L3-L5 fusion), fibromyalgia, OAB.   Patient is followed by cardiology Orvil, MD). She was last seen in the cardiology clinic on 05/28/2023; notes reviewed. At the time of her clinic visit, patient with increased shortness of breath, hypotension, and complaints of vertiginous symptoms. She denied any chest pain, PND, orthopnea, palpitations, significant  peripheral edema, weakness, fatigue, or presyncope/syncope. Patient with a past medical history significant for cardiovascular diagnoses. Documented physical exam was grossly benign, providing no evidence of acute exacerbation and/or decompensation of the patient's known cardiovascular conditions.  Coronary CTA was performed on 08/21/2014 that demonstrated an Agatston coronary artery calcium score of 873. This placed patient in the 99th percentile for age, sex, and race matched controls.   Long-term cardiac event monitor study was performed on 12/04/2017 revealing predominant underlying sinus rhythm with periods of sinus tachycardia.  Multiform PVCs were noted.  1 atrial run was observed with the longest lasting 4 beats at a maximum rate of 175 bpm.  Rare atrial ectopy noted; isolated SVE's (<1%, 297), SVE couplets (<1%, 9).   Most recent myocardial perfusion imaging study was performed on 12/15/2022 revealing a normal left ventricular systolic function with a hyperdynamic LVEF 73%.  There were no regional wall motion abnormalities.  Patient able to exercise for a total of 3 minutes and achieved a workload of 4.2 METS.  Study was truncated due to fatigue.  Study revealed no evidence of stress-induced myocardial ischemia or arrhythmia; no scintigraphic evidence of scar.  Study determined to be normal and low risk overall.   Most recent TTE was performed on 12/19/2022 revealing a normal left ventricular systolic function with an EF of >55%.  There were no regional wall motion abnormalities.  Left ventricular diastolic Doppler parameters consistent with abnormal relaxation (G1DD). There was moderate left atrial enlargement.  Right ventricular size and function normal.  Trivial tricuspid and mitral valve regurgitation noted.  All transvalvular gradients were noted to be normal providing no evidence suggestive of valvular stenosis.  Aorta normal in size with no evidence of aneurysmal dilatation.  Repeat  coronary CTA was performed on 04/10/2023 that demonstrated an Agatston coronary artery calcium score of  733.7. This placed patient in the 93rd percentile for age, sex, and race matched controls. Calcium depositions noted to be isolated mainly in the mRCA (50-60%, pLAD (50%), and LCx (60%) distributions.  Study demonstrated normal coronary origin with RIGHT dominance.  Further invasive evaluation of patient's chest pain was recommended.  Patient underwent diagnostic LEFT heart catheterization on 04/24/2023 revealing a normal left ventricular systolic function with an EF of 55-65%.  There was single vessel CAD observed with a 45% lesion noted in the proximal to mid LAD.  Given the nonobstructive nature of the patient's coronary artery disease, intervention was deferred opting for continued aggressive medical management.  Patient with an atrial fibrillation diagnosis; CHA2DS2-VASc Score = 5 (age, CHF, HTN, vascular disease history, T2DM). Her rate and rhythm are currently being maintained on oral diltiazem  + sotalol . She is chronically anticoagulated using apixaban ; reported to be compliant with therapy with no evidence or reports of GI/GU related bleeding.  Blood pressure was low in the office at 89/67 mmHg on her prescribed antihypertensives. She is on a PCSK9i (evolocumab) for her HLD diagnosis and further ASCVD prevention. T2DM well-controlled on currently prescribed regimen per patient report; last Hgb A1c was 5.7% when checked on 05/09/2023. Patient is able to complete all of her  ADL/IADLs without cardiovascular limitation.  Per the DASI, patient is able to achieve at least 4 METS of physical activity without experiencing any significant degree of angina/anginal equivalent symptoms. Patient follow-up with outpatient cardiology in 1 month or sooner if needed.   Tammy [name] underwent CT imaging of the chest for lung screening on 10/26/2021, at which time an aggressive appearing LEFT lower lobe  pulmonary nodule was appreciated; volume derived mean diameter 10.5 mm.  Subsequent PET scan demonstrated a spiculated nodule with a maximum SUV of 0.98.  Follow-up CT scan was recommended.  Repeat CT imaging was performed on 03/06/2022 demonstrating no significant change.  Indolent adenocarcinoma could not be excluded.  CT scan was again repeated on 11/13/2022 revealing interval increase in size to 2.2 x 0.7 cm with a 1.5 cm solid component.  Patient was referred to pulmonary medicine Herlene) to further discuss further evaluation of pulmonary nodule. Super D chest CT demonstrated the 2.0 cm partially solid nodule that was felt to be consistent with a  primary bronchogenic carcinoma.  Most recent imaging measured the nodule as being 1.4 x 3.0 cm in the anterior LEFT lower pulmonary lobe. Nodule with a 1.7 cm solid component. Following most recent imagine, patient  has subsequently been scheduled for a ROBOTIC ASSISTED NAVIGATIONAL BRONCHOSCOPY (Left) on 08/01/2023 with Dr. Dedra Sanders, MD.    Given patient's past medical history significant for cardiovascular diagnoses, presurgical cardiac clearance was sought by the PAT team. Per cardiology, this patient is optimized for surgery and may proceed with the planned procedural course with a MODERATE risk of significant perioperative cardiovascular complications.  Again, this patient is on daily oral anticoagulation therapy using a DOAC medication.  She has been instructed on recommendations for holding her apixaban  for 3 days prior to her procedure with plans to restart as soon as postoperative bleeding risk felt to be minimized by his attending surgeon. The patient has been instructed that her last dose of apixaban  should be on 01/11/202025.SABRA  Patient reports previous perioperative complications with anesthesia in the past. Patient has a PMH (+) for PONV. Symptoms and history of PONV will be discussed with patient by anesthesia team on the day of her  procedure. Interventions will be  ordered as deemed necessary based on patient's individual care needs as determined by anesthesiologist. In review of the available records, it is noted that patient underwent a general anesthetic course at [facility] ([facility]) in 05/2023 without documented complications.      07/19/2023    1:35 PM 05/28/2023    1:44 PM 05/27/2023    5:30 PM  Vitals with BMI  Height 5' 0 5' 0   Weight 180 lbs 189 lbs 13 oz   BMI 35.15 37.07   Systolic 122 89 115  Diastolic 80 67 55  Pulse 71 70 79   Providers/Specialists:  NOTE: Primary physician provider listed below. Patient may have been seen by APP or partner within same practice.   PROVIDER ROLE / SPECIALTY LAST SHERLEAN Tamea Dedra LITTIE, MD Pulmonary Medicine (Surgeon) 07/19/2023  Loreli Elsie [name], MD Primary Care Provider 07/30/2023  Fernand Alter, MD Cardiology 05/28/2023   Allergies:   Allergies  Allergen Reactions   Betadine [Povidone Iodine] Anaphylaxis   Contrast Media [Iodinated Contrast Media] Anaphylaxis   Iodine Anaphylaxis   Metrizamide Anaphylaxis   Povidone-Iodine Anaphylaxis   Shellfish Allergy Anaphylaxis   Hydrocodone -Acetaminophen  Nausea Only   Current Home Medications:   No current facility-administered medications for this encounter.    albuterol  (VENTOLIN  HFA) 108 (90 Base) MCG/ACT inhaler   apixaban  (ELIQUIS ) 5 MG TABS tablet   beta carotene w/minerals (OCUVITE) tablet   diltiazem  (CARDIZEM  CD) 240 MG 24 hr capsule   DULoxetine  (CYMBALTA ) 60 MG capsule   EPINEPHrine  0.3 mg/0.3 mL IJ SOAJ injection   Evolocumab (REPATHA SURECLICK) 140 MG/ML SOAJ   fesoterodine  (TOVIAZ ) 4 MG TB24 tablet   Fluticasone -Umeclidin-Vilant (TRELEGY ELLIPTA ) 100-62.5-25 MCG/ACT AEPB   metFORMIN  (GLUCOPHAGE ) 500 MG tablet   sotalol  (BETAPACE ) 80 MG tablet   tirzepatide (MOUNJARO) 10 MG/0.5ML Pen   tiZANidine  (ZANAFLEX ) 2 MG tablet   valACYclovir  (VALTREX ) 1000 MG tablet   valsartan   (DIOVAN ) 80 MG tablet   Vibegron (GEMTESA) 75 MG TABS    sodium chloride  flush (NS) 0.9 % injection 3 mL   History:   Past Medical History:  Diagnosis Date   Aortic atherosclerosis (HCC)    Atrial fibrillation (HCC)    a.) CHA2DS2VASc = 5 (age, CHF, HTN, vascular disease history, T2DM);  b.) rate/rhythm maintained on oral diltiazem  + sotolol; chronically anticoagulated with apixaban    CAD (coronary artery disease)    a.) cCTA 08/21/2014: Ca2+ = 873 (99th %ile); b.) cCTA 04/10/2023: Ca2+ = 733.7 (93rd %ile; 50-60% mRCA, 50% pLAD, 60% mLCx); c.) LHC 04/24/2023: 45% p-mLAD - med mgmt   CHF (congestive heart failure) (HCC)    a.) TTE 06/17/2020: EF 50-55%, LV dil, mild LVH, mild RVE, mod BAE, G1DD; b.) TTE 12/19/2022: EF >55%, mod LAE, triv TR/PR, G1DD   Cholelithiasis    Chronic calcific pancreatitis (HCC)    Complication of anesthesia    COPD (chronic obstructive pulmonary disease) (HCC)    DDD (degenerative disc disease), lumbar    a.) s/p L3-L5 fusion   DJD (degenerative joint disease)    Encephalitis    Fibromyalgia    Hepatic steatosis    Hiatal hernia    History of kidney stones    HTN (hypertension)    Hyperlipidemia    Lung nodule 10/26/2021   a.) LDCT 10/26/21: 10.5 mm macrolob/slightly spiculated LLL; b.) PET CT 11/09/21: not FCG avid (? indolent bronchogenic neoplasm); c.) LDCT 03/06/22: irreg nod ant LLL (no change); d.) CT chest 11/13/22: 2.2x0.7x1.5 irreg  subsolid nod with solid comp; e.) Super D chest CT 12/25/22:  2.0 cm part solid nod c/w primary bronch carcinoma; f.) CT chest 07/05/23: 1.4x3.0 LLL nod with 1.7cm solid comp   OAB (overactive bladder)    a.) on vibegron + fesoterodine    On apixaban  therapy    OSA on CPAP    PONV (postoperative nausea and vomiting)    T2DM (type 2 diabetes mellitus) (HCC)    Past Surgical History:  Procedure Laterality Date   APPENDECTOMY     BACK SURGERY  2022   BREAST CYST EXCISION     BREAST EXCISIONAL BIOPSY Left 2004    COLONOSCOPY WITH PROPOFOL  N/A 11/26/2020   Procedure: COLONOSCOPY WITH PROPOFOL ;  Surgeon: Rollin Dover, MD;  Location: [facility];  Service: Endoscopy;  Laterality: N/A;   COLONOSCOPY WITH PROPOFOL  N/A 02/16/2023   Procedure: COLONOSCOPY WITH PROPOFOL ;  Surgeon: Rollin Dover, MD;  Location: [facility];  Service: Gastroenterology;  Laterality: N/A;   DIAGNOSTIC LAPAROSCOPY     HEMOSTASIS CLIP PLACEMENT  11/26/2020   Procedure: HEMOSTASIS CLIP PLACEMENT;  Surgeon: Rollin Dover, MD;  Location: [facility];  Service: Endoscopy;;   KNEE ARTHROSCOPY     LEFT HEART CATH AND CORONARY ANGIOGRAPHY N/A 04/24/2023   Procedure: LEFT HEART CATH AND CORONARY ANGIOGRAPHY;  Surgeon: Fernand Denyse LABOR, MD;  Location: [facility];  Service: Cardiovascular;  Laterality: N/A;   POLYPECTOMY  11/26/2020   Procedure: POLYPECTOMY;  Surgeon: Rollin Dover, MD;  Location: [facility];  Service: Endoscopy;;   POLYPECTOMY  02/16/2023   Procedure: POLYPECTOMY;  Surgeon: Rollin Dover, MD;  Location: [facility];  Service: Gastroenterology;;   SUBMUCOSAL TATTOO INJECTION  11/26/2020   Procedure: SUBMUCOSAL TATTOO INJECTION;  Surgeon: Rollin Dover, MD;  Location: [facility];  Service: Endoscopy;;   TEE WITHOUT CARDIOVERSION N/A 06/29/2020   Procedure: TRANSESOPHAGEAL ECHOCARDIOGRAM (TEE) with DCCV;  Surgeon: Fernand Denyse LABOR, MD;  Location: [facility] ORS;  Service: Cardiovascular;  Laterality: N/A;   TOTAL KNEE ARTHROPLASTY Left 05/18/2023   Procedure: LEFT TOTAL KNEE ARTHROPLASTY;  Surgeon: Vernetta Lonni GRADE, MD;  Location: [facility];  Service: Orthopedics;  Laterality: Left;   Family History  Problem Relation Age of Onset   CAD Mother 29   Diabetes Father    Heart disease Father    CAD Brother 40   Throat cancer Paternal Uncle    Stomach cancer Maternal Grandfather    Breast cancer Cousin    Social History   Tobacco Use   Smoking status: Former    Current packs/day: 0.00    Average packs/day: 1  pack/day for 30.0 years (30.0 ttl pk-yrs)    Types: Cigarettes    Start date: 11/02/1978    Quit date: 11/01/2008    Years since quitting: 14.7   Smokeless tobacco: Never  Substance Use Topics   Alcohol  use: No    Alcohol /week: 0.0 standard drinks of alcohol    Pertinent Clinical Results:  LABS:  Lab Results  Component Value Date   WBC 12.1 (H) 05/27/2023   HGB 11.4 (L) 05/27/2023   HCT 34.2 (L) 05/27/2023   MCV 85.3 05/27/2023   PLT 431 (H) 05/27/2023   Lab Results  Component Value Date   NA 136 05/27/2023   K 4.2 05/27/2023   CO2 25 05/27/2023   GLUCOSE 117 (H) 05/27/2023   BUN 15 05/27/2023   CREATININE 0.67 05/27/2023   CALCIUM 8.8 (L) 05/27/2023   EGFR 89 04/20/2023   GFRNONAA >60 05/27/2023   Lab Results  Component Value Date   HGBA1C 5.7 (H) 05/09/2023    ECG: Date: 05/27/2023  Time ECG obtained: 1317 PM Rate: 95 bpm Rhythm: normal sinus Axis (leads I and aVF): normal Intervals: PR 116 ms. QRS 70 ms. QTc 495 ms. ST segment and T wave changes: No evidence of acute T wave abnormalities or significant ST segment elevation or depression.  Comparison: Similar to previous tracing obtained on 04/24/2023   IMAGING / PROCEDURES: PULMONARY FUNCTION TESTING performed on 05/01/2023   LEFT HEART CATHETERIZATION AND CORONARY ANGIOGRAPHY performed on 04/24/2023 Normal left ventricular systolic function with an EF of 55 to 65%. Normal LVEDP 45% stenosis of the proximal and mid LAD No complications.  Recommend aggressive medical therapy.   CT CORONARY MORPH W/CTA COR W/SCORE W/CA W/CM &/OR WO/CM performed on 04/10/2023 Calcium score: 733.7 Right dominant system RCA mid calcified 50%-60%. Proximal LAD calcified 50%. LCX proximal 60%.  Cardiac Cath if having chest pain.   TRANSTHORACIC ECHOCARDIOGRAM performed on 12/19/2022 Normal left ventricular systolic function with an EF of >55% Technically adequate study.  Normal chamber sizes.  Mild left ventricular  hypertrophy with GRADE 1(relaxation abnormality) diastolic dysfunction.  Normal right ventricular systolic function.  Normal right ventricular diastolic function.  Normal left ventricular wall motion.  Normal right ventricular wall motion.  Trace tricuspid regurgitation.  Normal pulmonary artery pressure.  Trace mitral regurgitation.  No pericardial effusion.  Moderately dilated Left atrium  No LVH    MYOCARDIAL PERFUSION IMAGING STUDY (LEXISCAN ) performed on 12/15/2022 Normal left ventricular systolic function with a hyperdynamic LVEF of 73% No evidence of stress-induced myocardial ischemia or arrhythmia; no scintigraphic evidence of scar Overall quality of the study is good Stress determined to be normal and low risk  Impression and Plan:  Tammy [name] has been referred for pre-anesthesia review and clearance prior to her undergoing the planned anesthetic and procedural courses. Available labs, pertinent testing, and imaging results were personally reviewed by me in preparation for upcoming operative/procedural course. [facility] medical record has been updated following extensive record review and patient interview with PAT staff.   This patient has been appropriately cleared by cardiology with an overall MODERATE risk of experiencing significant perioperative cardiovascular complications. Based on clinical review performed today (07/31/23), barring any significant acute changes in the patient's overall condition, it is anticipated that she will be able to proceed with the planned surgical intervention. Any acute changes in clinical condition may necessitate her procedure being postponed and/or cancelled. Patient will meet with anesthesia team (MD and/or CRNA) on the day of her procedure for preoperative evaluation/assessment. Questions regarding anesthetic course will be fielded at that time.   Pre-surgical instructions were reviewed with the patient during his PAT appointment,  and questions were fielded to satisfaction by PAT clinical staff. She has been instructed on which medications that she will need to hold prior to surgery, as well as the ones that have been deemed safe/appropriate to take on the day of his procedure. As part of the general education provided by PAT, patient made aware both verbally and in writing, that she would need to abstain from the use of any illegal substances during his perioperative course. She was advised that failure to follow the provided instructions could necessitate case cancellation or result in serious perioperative complications up to and including death. Patient encouraged to contact PAT and/or her surgeon's office to discuss any questions or concerns that may arise prior to surgery; verbalized understanding.   Dorise Pereyra, MSN, APRN, FNP-C,  [facility]  Perioperative Services Nurse Practitioner Phone: [phone] Fax: [phone] 07/31/23 11:38 AM  NOTE: This note has been prepared using Dragon dictation software. Despite my best ability to proofread, there is always the potential that unintentional transcriptional errors may still occur from this process.

## 2023-08-01 ENCOUNTER — Ambulatory Visit: Payer: Medicare Other | Admitting: Urgent Care

## 2023-08-01 ENCOUNTER — Ambulatory Visit: Payer: Medicare Other

## 2023-08-01 ENCOUNTER — Encounter
Admission: RE | Disposition: A | Discharge status: Home / Self Care | Payer: Self-pay | Source: Home / Self Care | Attending: Pulmonary Disease

## 2023-08-01 ENCOUNTER — Encounter: Payer: Self-pay | Admitting: Pulmonary Disease

## 2023-08-01 ENCOUNTER — Ambulatory Visit: Payer: Self-pay | Admitting: Urgent Care

## 2023-08-01 ENCOUNTER — Other Ambulatory Visit: Payer: Self-pay

## 2023-08-01 ENCOUNTER — Ambulatory Visit
Admission: RE | Admit: 2023-08-01 | Discharge: 2023-08-01 | Disposition: A | Discharge status: Home / Self Care | Payer: Medicare Other | Attending: Pulmonary Disease | Admitting: Pulmonary Disease

## 2023-08-01 DIAGNOSIS — Z48813 Encounter for surgical aftercare following surgery on the respiratory system: Secondary | ICD-10-CM | POA: Diagnosis not present

## 2023-08-01 DIAGNOSIS — I110 Hypertensive heart disease with heart failure: Secondary | ICD-10-CM | POA: Insufficient documentation

## 2023-08-01 DIAGNOSIS — I480 Paroxysmal atrial fibrillation: Secondary | ICD-10-CM | POA: Diagnosis not present

## 2023-08-01 DIAGNOSIS — Z01812 Encounter for preprocedural laboratory examination: Secondary | ICD-10-CM

## 2023-08-01 DIAGNOSIS — J9811 Atelectasis: Secondary | ICD-10-CM | POA: Diagnosis not present

## 2023-08-01 DIAGNOSIS — C3432 Malignant neoplasm of lower lobe, left bronchus or lung: Secondary | ICD-10-CM | POA: Insufficient documentation

## 2023-08-01 DIAGNOSIS — E119 Type 2 diabetes mellitus without complications: Secondary | ICD-10-CM | POA: Diagnosis not present

## 2023-08-01 DIAGNOSIS — E66813 Obesity, class 3: Secondary | ICD-10-CM | POA: Insufficient documentation

## 2023-08-01 DIAGNOSIS — Z7951 Long term (current) use of inhaled steroids: Secondary | ICD-10-CM | POA: Insufficient documentation

## 2023-08-01 DIAGNOSIS — R911 Solitary pulmonary nodule: Secondary | ICD-10-CM

## 2023-08-01 DIAGNOSIS — G4733 Obstructive sleep apnea (adult) (pediatric): Secondary | ICD-10-CM | POA: Insufficient documentation

## 2023-08-01 DIAGNOSIS — Z87891 Personal history of nicotine dependence: Secondary | ICD-10-CM | POA: Diagnosis not present

## 2023-08-01 DIAGNOSIS — I5032 Chronic diastolic (congestive) heart failure: Secondary | ICD-10-CM | POA: Diagnosis not present

## 2023-08-01 DIAGNOSIS — J449 Chronic obstructive pulmonary disease, unspecified: Secondary | ICD-10-CM | POA: Diagnosis not present

## 2023-08-01 DIAGNOSIS — Z7901 Long term (current) use of anticoagulants: Secondary | ICD-10-CM | POA: Insufficient documentation

## 2023-08-01 DIAGNOSIS — R590 Localized enlarged lymph nodes: Secondary | ICD-10-CM | POA: Diagnosis not present

## 2023-08-01 DIAGNOSIS — Z7984 Long term (current) use of oral hypoglycemic drugs: Secondary | ICD-10-CM | POA: Diagnosis not present

## 2023-08-01 DIAGNOSIS — Z6835 Body mass index (BMI) 35.0-35.9, adult: Secondary | ICD-10-CM | POA: Insufficient documentation

## 2023-08-01 DIAGNOSIS — R846 Abnormal cytological findings in specimens from respiratory organs and thorax: Secondary | ICD-10-CM | POA: Diagnosis not present

## 2023-08-01 DIAGNOSIS — Z7985 Long-term (current) use of injectable non-insulin antidiabetic drugs: Secondary | ICD-10-CM | POA: Insufficient documentation

## 2023-08-01 DIAGNOSIS — I4891 Unspecified atrial fibrillation: Secondary | ICD-10-CM | POA: Diagnosis not present

## 2023-08-01 HISTORY — DX: Other intervertebral disc degeneration, lumbar region without mention of lumbar back pain or lower extremity pain: M51.369

## 2023-08-01 HISTORY — DX: Other chronic pancreatitis: K86.1

## 2023-08-01 HISTORY — DX: Long term (current) use of anticoagulants: Z79.01

## 2023-08-01 HISTORY — DX: Fatty (change of) liver, not elsewhere classified: K76.0

## 2023-08-01 HISTORY — DX: Diaphragmatic hernia without obstruction or gangrene: K44.9

## 2023-08-01 HISTORY — DX: Calculus of gallbladder without cholecystitis without obstruction: K80.20

## 2023-08-01 LAB — GLUCOSE, CAPILLARY
Glucose-Capillary: 93 mg/dL (ref 70–99)
Glucose-Capillary: 83 mg/dL (ref 70–99)

## 2023-08-01 SURGERY — BRONCHOSCOPY, WITH BIOPSY USING ELECTROMAGNETIC NAVIGATION
Anesthesia: General | Laterality: Left

## 2023-08-01 MED ORDER — SUGAMMADEX SODIUM 200 MG/2ML IV SOLN
INTRAVENOUS | Status: DC | PRN
  Administered 2023-08-01: 200 mg via INTRAVENOUS

## 2023-08-01 MED ORDER — ROCURONIUM BROMIDE 10 MG/ML (PF) SYRINGE
PREFILLED_SYRINGE | INTRAVENOUS | Status: AC
  Filled 2023-08-01: qty 10

## 2023-08-01 MED ORDER — PHENYLEPHRINE 80 MCG/ML (10ML) SYRINGE FOR IV PUSH (FOR BLOOD PRESSURE SUPPORT)
PREFILLED_SYRINGE | INTRAVENOUS | Status: DC | PRN
  Administered 2023-08-01 (×3): 160 ug via INTRAVENOUS
  Administered 2023-08-01: 80 ug via INTRAVENOUS
  Administered 2023-08-01: 160 ug via INTRAVENOUS

## 2023-08-01 MED ORDER — DEXAMETHASONE SODIUM PHOSPHATE 10 MG/ML IJ SOLN
INTRAMUSCULAR | Status: AC
  Filled 2023-08-01: qty 1

## 2023-08-01 MED ORDER — IPRATROPIUM-ALBUTEROL 0.5-2.5 (3) MG/3ML IN SOLN
RESPIRATORY_TRACT | Status: AC
  Filled 2023-08-01: qty 3

## 2023-08-01 MED ORDER — LIDOCAINE HCL (CARDIAC) PF 100 MG/5ML IV SOSY
PREFILLED_SYRINGE | INTRAVENOUS | Status: DC | PRN
  Administered 2023-08-01: 100 mg via INTRAVENOUS
  Administered 2023-08-01: 80 mg via INTRAVENOUS

## 2023-08-01 MED ORDER — LIDOCAINE HCL (PF) 2 % IJ SOLN
INTRAMUSCULAR | Status: AC
  Filled 2023-08-01: qty 5

## 2023-08-01 MED ORDER — PROPOFOL 10 MG/ML IV BOLUS
INTRAVENOUS | Status: DC | PRN
  Administered 2023-08-01: 130 mg via INTRAVENOUS
  Administered 2023-08-01: 50 mg via INTRAVENOUS

## 2023-08-01 MED ORDER — FENTANYL CITRATE (PF) 100 MCG/2ML IJ SOLN: 25.0000 ug | INTRAMUSCULAR | Status: DC | PRN

## 2023-08-01 MED ORDER — PROPOFOL 10 MG/ML IV BOLUS
INTRAVENOUS | Status: AC
  Filled 2023-08-01: qty 20

## 2023-08-01 MED ORDER — CHLORHEXIDINE GLUCONATE 0.12 % MT SOLN
OROMUCOSAL | Status: AC
  Filled 2023-08-01: qty 15

## 2023-08-01 MED ORDER — ROCURONIUM BROMIDE 100 MG/10ML IV SOLN
INTRAVENOUS | Status: DC | PRN
  Administered 2023-08-01: 50 mg via INTRAVENOUS
  Administered 2023-08-01: 20 mg via INTRAVENOUS

## 2023-08-01 MED ORDER — ONDANSETRON HCL 4 MG/2ML IJ SOLN
INTRAMUSCULAR | Status: DC | PRN
  Administered 2023-08-01: 4 mg via INTRAVENOUS

## 2023-08-01 MED ORDER — FENTANYL CITRATE (PF) 100 MCG/2ML IJ SOLN
INTRAMUSCULAR | Status: AC
  Filled 2023-08-01: qty 2

## 2023-08-01 MED ORDER — IPRATROPIUM-ALBUTEROL 0.5-2.5 (3) MG/3ML IN SOLN
3.0000 mL | Freq: Once | RESPIRATORY_TRACT | Status: AC
  Administered 2023-08-01: 3 mL via RESPIRATORY_TRACT

## 2023-08-01 MED ORDER — OXYCODONE HCL 5 MG/5ML PO SOLN: 5.0000 mg | Freq: Once | ORAL | Status: DC | PRN

## 2023-08-01 MED ORDER — SEVOFLURANE IN SOLN
RESPIRATORY_TRACT | Status: AC
  Filled 2023-08-01: qty 250

## 2023-08-01 MED ORDER — DEXAMETHASONE SODIUM PHOSPHATE 10 MG/ML IJ SOLN
INTRAMUSCULAR | Status: DC | PRN
  Administered 2023-08-01: 10 mg via INTRAVENOUS

## 2023-08-01 MED ORDER — FENTANYL CITRATE (PF) 100 MCG/2ML IJ SOLN
INTRAMUSCULAR | Status: DC | PRN
  Administered 2023-08-01 (×2): 50 ug via INTRAVENOUS

## 2023-08-01 MED ORDER — GLYCOPYRROLATE 0.2 MG/ML IJ SOLN
INTRAMUSCULAR | Status: AC
  Filled 2023-08-01: qty 1

## 2023-08-01 MED ORDER — OXYCODONE HCL 5 MG PO TABS: 5.0000 mg | ORAL_TABLET | Freq: Once | ORAL | Status: DC | PRN

## 2023-08-01 MED ORDER — ONDANSETRON HCL 4 MG/2ML IJ SOLN
INTRAMUSCULAR | Status: AC
  Filled 2023-08-01: qty 2

## 2023-08-01 NOTE — Anesthesia Procedure Notes (Addendum)
 Procedure Name: Intubation Date/Time: 08/01/2023 12:39 PM  Performed by: Izell Marsh, CRNAPre-anesthesia Checklist: Patient identified, Patient being monitored, Timeout performed, Emergency Drugs available and Suction available Patient Re-evaluated:Patient Re-evaluated prior to induction Oxygen  Delivery Method: Circle system utilized Preoxygenation: Pre-oxygenation with 100% oxygen  Induction Type: IV induction Ventilation: Mask ventilation without difficulty Laryngoscope Size: 3 and McGrath Grade View: Grade I Tube type: Oral Tube size: 8.5 mm Number of attempts: 1 Airway Equipment and Method: Stylet Placement Confirmation: ETT inserted through vocal cords under direct vision, positive ETCO2 and breath sounds checked- equal and bilateral Secured at: 23 cm Tube secured with: Tape Dental Injury: Teeth and Oropharynx as per pre-operative assessment

## 2023-08-01 NOTE — Anesthesia Preprocedure Evaluation (Signed)
 Anesthesia Evaluation  Patient identified by MRN, date of birth, ID band Patient awake    Reviewed: Allergy & Precautions, NPO status , Patient's Chart, lab work & pertinent test results  History of Anesthesia Complications (+) PONV and history of anesthetic complications  Airway Mallampati: III  TM Distance: >3 FB Neck ROM: full    Dental no notable dental hx.    Pulmonary sleep apnea and Continuous Positive Airway Pressure Ventilation , COPD,  COPD inhaler, former smoker   Pulmonary exam normal        Cardiovascular hypertension, On Medications (-) angina + CAD and +CHF  (-) DOE + dysrhythmias Atrial Fibrillation   LHC 10/24   Prox LAD to Mid LAD lesion is 45% stenosed.   The left ventricular systolic function is normal.   LV end diastolic pressure is normal.   The left ventricular ejection fraction is 55-65% by visual estimate.   There is no aortic valve stenosis.    Neuro/Psych  PSYCHIATRIC DISORDERS  Depression     Neuromuscular disease    GI/Hepatic Neg liver ROS, hiatal hernia,,,  Endo/Other  negative endocrine ROSdiabetes    Renal/GU      Musculoskeletal   Abdominal   Peds  Hematology negative hematology ROS (+)   Anesthesia Other Findings Past Medical History: No date: Aortic atherosclerosis (HCC) No date: Atrial fibrillation (HCC)     Comment:  a.) CHA2DS2VASc = 5 (age, CHF, HTN, vascular disease               history, T2DM);  b.) rate/rhythm maintained on oral               diltiazem  + sotolol; chronically anticoagulated with               apixaban  No date: CAD (coronary artery disease)     Comment:  a.) cCTA 08/21/2014: Ca2+ = 873 (99th %ile); b.) cCTA               04/10/2023: Ca2+ = 733.7 (93rd %ile; 50-60% mRCA, 50%               pLAD, 60% mLCx); c.) LHC 04/24/2023: 45% p-mLAD - med               mgmt No date: CHF (congestive heart failure) (HCC)     Comment:  a.) TTE 06/17/2020: EF  50-55%, LV dil, mild LVH, mild               RVE, mod BAE, G1DD; b.) TTE 12/19/2022: EF >55%, mod LAE,              triv TR/PR, G1DD No date: Cholelithiasis No date: Chronic calcific pancreatitis (HCC) No date: Complication of anesthesia No date: COPD (chronic obstructive pulmonary disease) (HCC) No date: DDD (degenerative disc disease), lumbar     Comment:  a.) s/p L3-L5 fusion No date: DJD (degenerative joint disease) No date: Encephalitis No date: Fibromyalgia No date: Hepatic steatosis No date: Hiatal hernia No date: History of kidney stones No date: HTN (hypertension) No date: Hyperlipidemia 10/26/2021: Lung nodule     Comment:  a.) LDCT 10/26/21: 10.5 mm macrolob/slightly spiculated               LLL; b.) PET CT 11/09/21: not FCG avid (? 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; f.) CT chest 07/05/23: 1.4x3.0 LLL nod with               1.7cm solid comp No date: OAB (overactive bladder)     Comment:  a.) on vibegron + fesoterodine  No date: On apixaban  therapy No date: OSA on CPAP No date: PONV (postoperative nausea and vomiting) No date: T2DM (type 2 diabetes mellitus) (HCC)  Past Surgical History: No date: APPENDECTOMY 2022: BACK SURGERY No date: BREAST CYST EXCISION 2004: BREAST EXCISIONAL BIOPSY; Left 11/26/2020: COLONOSCOPY WITH PROPOFOL ; N/A     Comment:  Procedure: COLONOSCOPY WITH PROPOFOL ;  Surgeon: Alvis Jourdain, MD;  Location: [facility];  Service:               Endoscopy;  Laterality: N/A; 02/16/2023: COLONOSCOPY WITH PROPOFOL ; N/A     Comment:  Procedure: COLONOSCOPY WITH PROPOFOL ;  Surgeon: Alvis Jourdain, MD;  Location: [facility];  Service:                Gastroenterology;  Laterality: N/A; No date: DIAGNOSTIC LAPAROSCOPY 11/26/2020: HEMOSTASIS CLIP PLACEMENT     Comment:  Procedure: HEMOSTASIS CLIP PLACEMENT;  Surgeon: Alvis Jourdain, MD;  Location: [facility];  Service:               Endoscopy;; No date: KNEE ARTHROSCOPY 04/24/2023: LEFT HEART CATH AND CORONARY ANGIOGRAPHY; N/A     Comment:  Procedure: LEFT HEART CATH AND CORONARY ANGIOGRAPHY;                Surgeon: Cherrie Cornwall, MD;  Location: [facility]              LAB;  Service: Cardiovascular;  Laterality: N/A; 11/26/2020: POLYPECTOMY     Comment:  Procedure: POLYPECTOMY;  Surgeon: Alvis Jourdain, MD;                Location: [facility];  Service: Endoscopy;; 02/16/2023: POLYPECTOMY     Comment:  Procedure: POLYPECTOMY;  Surgeon: Alvis Jourdain, MD;                Location: [facility];  Service: Gastroenterology;; 11/26/2020: SUBMUCOSAL TATTOO INJECTION     Comment:  Procedure: SUBMUCOSAL TATTOO INJECTION;  Surgeon: Alvis Jourdain, MD;  Location: [facility];  Service:               Endoscopy;; 06/29/2020: TEE WITHOUT CARDIOVERSION; N/A     Comment:  Procedure: TRANSESOPHAGEAL ECHOCARDIOGRAM (TEE) with               DCCV;  Surgeon: Cherrie Cornwall, MD;  Location: [facility] ORS;              Service: Cardiovascular;  Laterality: N/A; 05/18/2023: TOTAL KNEE ARTHROPLASTY; Left     Comment:  Procedure: LEFT  TOTAL KNEE ARTHROPLASTY;  Surgeon:               Arnie Lao, MD;  Location: [facility];  Service:              Orthopedics;  Laterality: Left;     Reproductive/Obstetrics negative OB ROS                              Anesthesia Physical Anesthesia Plan  ASA: 3  Anesthesia Plan: General ETT   Post-op Pain Management: Toradol  IV (intra-op)* and Ofirmev  IV (intra-op)*   Induction: Intravenous  PONV Risk Score and Plan: 3 and Ondansetron , Dexamethasone , Treatment may vary due to age or medical condition,  TIVA and Propofol  infusion  Airway Management Planned: Oral ETT  Additional Equipment:   Intra-op Plan:   Post-operative Plan: Extubation in OR  Informed Consent: I have reviewed the patients History and Physical, chart, labs and discussed the procedure including the risks, benefits and alternatives for the proposed anesthesia with the patient or authorized representative who has indicated his/her understanding and acceptance.     Dental Advisory Given  Plan Discussed with: Anesthesiologist, CRNA and Surgeon  Anesthesia Plan Comments: (Patient consented for risks of anesthesia including but not limited to:  - adverse reactions to medications - damage to eyes, teeth, lips or other oral mucosa - nerve damage due to positioning  - sore throat or hoarseness - Damage to heart, brain, nerves, lungs, other parts of body or loss of life  Patient voiced understanding and assent.)         Anesthesia Quick Evaluation

## 2023-08-01 NOTE — Transfer of Care (Signed)
 Immediate Anesthesia Transfer of Care Note  Patient: Tammy [name]  Procedure(s) Performed: ROBOTIC ASSISTED NAVIGATIONAL BRONCHOSCOPY (Left)  Patient Location: PACU  Anesthesia Type:General  Level of Consciousness: awake, alert , and oriented  Airway & Oxygen  Therapy: Patient Spontanous Breathing and Patient connected to nasal cannula oxygen   Post-op Assessment: Report given to RN and Post -op Vital signs reviewed and stable  Post vital signs: stable  Last Vitals:  Vitals Value Taken Time  BP 146/77 08/01/23 1353  Temp 36.2 C 08/01/23 1353  Pulse 76 08/01/23 1356  Resp 13 08/01/23 1356  SpO2 100 % 08/01/23 1356  Vitals shown include unfiled device data.  Last Pain:  Vitals:   08/01/23 1353  TempSrc:   PainSc: 0-No pain         Complications: No notable events documented.

## 2023-08-01 NOTE — Interval H&P Note (Signed)
 Tammy [name] has presented today for surgery, with the diagnosis of LEFT lung nodule, enlarging.  The various methods of treatment have been discussed with the patient and family. After consideration of risks, benefits and other options for treatment, the patient has consented to  Procedure(s): ROBOTIC ASSISTED NAVIGATIONAL BRONCHOSCOPY, FIDUCIAL MARKER PLACEMENT AND ENDOBRONCHIAL ULTRASOUND-LEFT as a surgical intervention.  [name] history has been reviewed, patient examined, no change in status, stable for surgery.  I have reviewed the patient's chart and labs.  Questions were answered to the patient's satisfaction.  Patient understands that endobronchial ultrasound and fiducial marker placement will be performed during the procedure as well as biopsies of the left lower lobe lung nodule.  Benefits, limitations and potential complications of the procedure were discussed with the patient/family.  Complications from bronchoscopy are rare and most often minor, but if they occur they may include breathing difficulty, vocal cord spasm, hoarseness, slight fever, vomiting, dizziness, bronchospasm, infection, low blood oxygen , bleeding from biopsy site, or an allergic reaction to medications.  It is uncommon for patients to experience other more serious complications for example: Collapsed lung requiring chest tube placement, respiratory failure, heart attack and/or cardiac arrhythmia.  Patient agrees to proceed.  Tammy Ace, MD Advanced Bronchoscopy PCCM [facility]    *This note was generated using voice recognition software/Dragon and/or AI transcription program.  Despite best efforts to proofread, errors can occur which can change the meaning. Any transcriptional errors that result from this process are unintentional and may not be fully corrected at the time of dictation.

## 2023-08-01 NOTE — Op Note (Signed)
 PROCEDURES: Robotic assisted bronchoscopy Endobronchial ultrasound Augmented fluoroscopy Placement of fiducial marker   Indication: Enlarging left lower lobe nodule 1.4 x 3.0 cm with internal solid component measuring 1.7 cm, size increased from prior, rule out cancer.  Suspected paratracheal adenopathy, not pathologically appearing.  Preoperative Diagnosis: Left lower lobe nodule, rule out cancer Post Procedure Diagnosis: Same as above Consent: Verbal/Written: obtained  Benefits, limitations and potential complications of the procedure were discussed with the patient/family.  Complications from bronchoscopy are rare and most often minor, but if they occur they may include breathing difficulty, vocal cord spasm, hoarseness, slight fever, vomiting, dizziness, bronchospasm, infection, low blood oxygen , bleeding from biopsy site, or an allergic reaction to medications.  It is uncommon for patients to experience other more serious complications for example: Collapsed lung requiring chest tube placement, respiratory failure, heart attack and/or cardiac arrhythmia.  Patient understood the potential complications and agreed to proceed.  Surgeon: Acey Ace, MD Assistant/Scrub: Gaylord Kearns, RRT Circulator: N/A Anesthesiologist/CRNA: Walterine Gunther, MD/CandaceFerencik, CRNA Cytotechnology: Trenia Fritter, team lead  Fluoroscopy technician: Marty Sleet, RT Representatives: Hallie Kile, Auris Monarch ([employer]), Edilia Gordon, [employer]  Type of Anesthesia: General endotracheal  Procedures Performed:   Robotic bronchoscopy: Procedure consists of robotic navigation comprised of electromagnetics, optical pattern recognition and robotic kinematic data - to triangulate bronchoscope location during the procedure and provide accurate positional data to biopsy a lesion. Augmented fluoroscopy with Body Vision.  Description of Procedure:  Robotic bronchoscopy: The patient was brought to Procedure Room 2  (Bronchoscopy Suite) in the OR area where appropriate timeout was taken with the staff after the patient was inducted under general anesthesia.  The patient was inducted under general anesthesia and intubated by the anesthesia team.  Patient was intubated with a 8.5 ET tube without difficulty.  Tube was secured at 4 cm above the carina.  A Portex adapter was placed on the ET tube flange.  Once the patient was under adequate general anesthesia the Olympus therapeutic video bronchoscope was advanced and an anatomic airway tour and surveillance bronchoscopy was performed.The distal trachea appeared unremarkable. The main carina was sharp.  No secretions were seen in either right or left mainstem bronchi. The RUL, RLL, RML appeared to be free of endobronchial masses, lesions, or purulent secretions. Likewise, the LLL/LUL appeared to be free of endobronchial masses, lesions, or purulent secretions. Once the survey bronchoscopy was completed, registration for the augmented fluoroscopy (Body Vision) was then performed with the fluoroscopic C arm.  Once this was completed, the robotic bronchoscope ET tube adapter was placed and ETT was cut to proper length and secured on the mid plane.  The [facility] robotic scope was then advanced through the ETT and registration was performed successfully.  There was good correlation between the robotic mapping and bronchoscopic mapping. With the assistance of fused navigation, the bronchoscope was advanced to the LLL nodule/mass. The tip of the working channel sat within 14 mm mm of the nodule.  The very distal airway exhibited some mucosal abnormality (see photo below).  Positioning was confirmed with augmented fluoroscopy. Augmented fluoroscopy via Body Vision was utilized to optimize the position most favorable for biopsies, then the robotic bronchoscope was anchored to maintain position.  A total of 8 transbronchial biopsies were obtained at this point, the lesion could be seen moving  during biopsies.  After confirmation of excellent hemostasis, 2 passes passes with a cytology brush on the LLL nodule/mass were performed, the brushes were cut into CytoLyt preservative.  A transbronchial needle  aspirate also obtained at this segment, a total of 4 passes were performed with an Olympus Peri View 21-gauge needle.  Excellent cores were obtained with the needle.  ROSE was performed on the needle specimen and the sample was consistent with lesional cells.  The remainder of the needle rinse was which sent for cytology analysis in CytoLyt.   After completing the biopsies, the robotic bronchoscope was then retracted all the way out after confirmation of excellent hemostasis.  At this point an Olympus endobronchial ultrasound scope was introduced through the existing ET tube and the mediastinum examined.  There was no pathologic appearing adenopathy in the paratracheal, precarinal, hilar and subcarinal areas.  Once this was completed and ensuring adequate hemostasis, the endobronchial ultrasound scope was retrieved and the procedure was terminated.  The patient tolerated the procedure well. No significant bleeding was observed at the conclusion of the procedure.  At this point, the patient was allowed to emerge from general anesthesia, and was extubated in the procedure room without incident.  The patient  was taken to the PACU in satisfactory condition.  Auscultation of the lungs showed no change from pre bronchoscopy examination.  Patient tolerated the procedure very well with no untoward effects of anesthesia noted.   Specimens Obtained:  Transbronchial Forceps Biopsy: X 8, left lower lobe  Transbronchial Brush: X 2, left lower lobe  Transbronchial Needle Aspirate: X 4, left lower lobe  Fiducial Marker Placement: X 1 left lower lobe  Fluoroscopy: Augmented fluoroscopy (Body Vision) was utilized during the course of this procedure to assure that biopsies were taken in a safe manner under  fluoroscopic guidance with spot films required.  Total fluoroscopy time: 7 minutes 32 seconds, total dose 60.32 mGy.  Intraoperative images: Robotic bronchoscope at target, mucosal distortion on the airway (blue arrows):   Fluoroscopic image during biopsies on the left lower lobe:   ROSE examination of transbronchial needle aspirate consistent with lesional cells:   After deployment of fiducial marker (arrow):  Complications: None, no pneumothorax on post film, fiducial marker in place (circle):   Estimated Blood Loss: Nil    Assessment and Plan/Additional Comments: Left lower lobe subsolid nodule rule out carcinoma (favor adenocarcinoma) No evidence of pathologic appearing adenopathy on endobronchial ultrasound examination Await pathology reports Patient has appropriate Pulmonary Medicine follow-up     C. Chloe Counter, MD Advanced Bronchoscopy PCCM [facility]    *This note was dictated using voice recognition software/Dragon.  Despite best efforts to proofread, errors can occur which can change the meaning.  Any change was purely unintentional.

## 2023-08-02 ENCOUNTER — Encounter: Payer: Self-pay | Admitting: Cardiovascular Disease

## 2023-08-02 ENCOUNTER — Ambulatory Visit: Payer: Medicare Other | Admitting: Cardiovascular Disease

## 2023-08-02 VITALS — BP 122/90 | HR 83 | Ht 61.0 in | Wt 179.6 lb

## 2023-08-02 DIAGNOSIS — I5031 Acute diastolic (congestive) heart failure: Secondary | ICD-10-CM | POA: Diagnosis not present

## 2023-08-02 DIAGNOSIS — I10 Essential (primary) hypertension: Secondary | ICD-10-CM

## 2023-08-02 DIAGNOSIS — E782 Mixed hyperlipidemia: Secondary | ICD-10-CM

## 2023-08-02 DIAGNOSIS — R0602 Shortness of breath: Secondary | ICD-10-CM | POA: Diagnosis not present

## 2023-08-02 DIAGNOSIS — I2510 Atherosclerotic heart disease of native coronary artery without angina pectoris: Secondary | ICD-10-CM | POA: Diagnosis not present

## 2023-08-02 NOTE — Anesthesia Postprocedure Evaluation (Signed)
Anesthesia Post Note  Patient: Tammy [name]  Procedure(s) Performed: ROBOTIC ASSISTED NAVIGATIONAL BRONCHOSCOPY (Left)  Patient location during evaluation: PACU Anesthesia Type: General Level of consciousness: awake and alert Pain management: pain level controlled Vital Signs Assessment: post-procedure vital signs reviewed and stable Respiratory status: spontaneous breathing, nonlabored ventilation, respiratory function stable and patient connected to nasal cannula oxygen Cardiovascular status: blood pressure returned to baseline and stable Postop Assessment: no apparent nausea or vomiting Anesthetic complications: no   There were no known notable events for this encounter.   Last Vitals:  Vitals:   08/01/23 1430 08/01/23 1441  BP: 132/68   Pulse: 73 78  Resp: 13 14  Temp: (!) 36.4 C 36.6 C  SpO2: 96% 96%    Last Pain:  Vitals:   08/01/23 1441  TempSrc: Temporal  PainSc: 0-No pain                 Louie Boston

## 2023-08-02 NOTE — Progress Notes (Signed)
Cardiology Office Note   Date:  08/02/2023   ID:  Jiovanna, Drollinger 16-Feb-1951, MRN [id number]  PCP:  Cleatis Polka., MD  Cardiologist:  Adrian Blackwater, MD      History of Present Illness: Tammy [name] is a 73 y.o. female who presents for  Chief Complaint  Patient presents with   Follow-up    3 months    Feeling fine and stopped farxiga.  Patient stopped Marcelline Deist because she was told that she had all her symptoms due to UTI and after UTI was treated her symptoms resolved.  Patient  denies any chest pain or shortness of breath or palpitation right now.      Past Medical History:  Diagnosis Date   Aortic atherosclerosis (HCC)    Atrial fibrillation (HCC)    a.) CHA2DS2VASc = 5 (age, CHF, HTN, vascular disease history, T2DM);  b.) rate/rhythm maintained on oral diltiazem + sotolol; chronically anticoagulated with apixaban   CAD (coronary artery disease)    a.) cCTA 08/21/2014: Ca2+ = 873 (99th %ile); b.) cCTA 04/10/2023: Ca2+ = 733.7 (93rd %ile; 50-60% mRCA, 50% pLAD, 60% mLCx); c.) LHC 04/24/2023: 45% p-mLAD - med mgmt   CHF (congestive heart failure) (HCC)    a.) TTE 06/17/2020: EF 50-55%, LV dil, mild LVH, mild RVE, mod BAE, G1DD; b.) TTE 12/19/2022: EF >55%, mod LAE, triv TR/PR, G1DD   Cholelithiasis    Chronic calcific pancreatitis (HCC)    Complication of anesthesia    COPD (chronic obstructive pulmonary disease) (HCC)    DDD (degenerative disc disease), lumbar    a.) s/p L3-L5 fusion   DJD (degenerative joint disease)    Encephalitis    Fibromyalgia    Hepatic steatosis    Hiatal hernia    History of kidney stones    HTN (hypertension)    Hyperlipidemia    Lung nodule 10/26/2021   a.) LDCT 10/26/21: 10.5 mm macrolob/slightly spiculated LLL; b.) PET CT 11/09/21: not FCG avid (? 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; f.) CT chest 07/05/23: 1.4x3.0 LLL nod with 1.7cm solid comp   OAB (overactive bladder)    a.) on vibegron + fesoterodine   On apixaban therapy    OSA on CPAP    PONV (postoperative nausea and vomiting)    T2DM (type 2 diabetes mellitus) (HCC)      Past Surgical History:  Procedure Laterality Date   APPENDECTOMY     BACK SURGERY  2022   BREAST CYST EXCISION     BREAST EXCISIONAL BIOPSY Left 2004   COLONOSCOPY WITH PROPOFOL N/A 11/26/2020   Procedure: COLONOSCOPY WITH PROPOFOL;  Surgeon: Jeani Hawking, MD;  Location: [facility];  Service: Endoscopy;  Laterality: N/A;   COLONOSCOPY WITH PROPOFOL N/A 02/16/2023   Procedure: COLONOSCOPY WITH PROPOFOL;  Surgeon: Jeani Hawking, MD;  Location: [facility];  Service: Gastroenterology;  Laterality: N/A;   DIAGNOSTIC LAPAROSCOPY     HEMOSTASIS CLIP PLACEMENT  11/26/2020   Procedure: HEMOSTASIS CLIP PLACEMENT;  Surgeon: Jeani Hawking, MD;  Location: [facility];  Service: Endoscopy;;   KNEE ARTHROSCOPY     LEFT HEART CATH AND CORONARY ANGIOGRAPHY N/A 04/24/2023   Procedure: LEFT HEART CATH AND CORONARY ANGIOGRAPHY;  Surgeon: Laurier Nancy, MD;  Location: [facility];  Service: Cardiovascular;  Laterality: N/A;   POLYPECTOMY  11/26/2020   Procedure: POLYPECTOMY;  Surgeon: Jeani Hawking, MD;  Location: [facility];  Service: Endoscopy;;   POLYPECTOMY  02/16/2023   Procedure: POLYPECTOMY;  Surgeon: Jeani Hawking, MD;  Location: [facility];  Service: Gastroenterology;;   SUBMUCOSAL TATTOO INJECTION  11/26/2020   Procedure: SUBMUCOSAL TATTOO INJECTION;  Surgeon: Jeani Hawking, MD;  Location: [facility];  Service: Endoscopy;;   TEE WITHOUT CARDIOVERSION N/A 06/29/2020   Procedure: TRANSESOPHAGEAL ECHOCARDIOGRAM (TEE) with DCCV;  Surgeon: Laurier Nancy, MD;  Location: [facility];  Service: Cardiovascular;  Laterality: N/A;   TOTAL KNEE ARTHROPLASTY Left  05/18/2023   Procedure: LEFT TOTAL KNEE ARTHROPLASTY;  Surgeon: Kathryne Hitch, MD;  Location: [facility];  Service: Orthopedics;  Laterality: Left;     Current Outpatient Medications  Medication Sig Dispense Refill   albuterol (VENTOLIN HFA) 108 (90 Base) MCG/ACT inhaler Inhale 2 puffs into the lungs every 6 (six) hours as needed. 8 g 2   apixaban (ELIQUIS) 5 MG TABS tablet Take 5 mg by mouth 2 (two) times daily.     diltiazem (CARDIZEM CD) 240 MG 24 hr capsule Take 1 capsule (240 mg total) by mouth daily. 90 capsule 0   DULoxetine (CYMBALTA) 60 MG capsule Take 60 mg by mouth daily.     EPINEPHrine 0.3 mg/0.3 mL IJ SOAJ injection Inject 0.3 mg into the muscle as needed for anaphylaxis.     Evolocumab (REPATHA SURECLICK) 140 MG/ML SOAJ Inject 140 mg into the skin every 14 (fourteen) days.     fesoterodine (TOVIAZ) 4 MG TB24 tablet Take 4 mg by mouth daily.     Fluticasone-Umeclidin-Vilant (TRELEGY ELLIPTA) 100-62.5-25 MCG/ACT AEPB Inhale 1 Dose into the lungs daily. 180 each 3   metFORMIN (GLUCOPHAGE) 500 MG tablet Take 500 mg by mouth 2 (two) times daily.     sotalol (BETAPACE) 80 MG tablet Take 1 tablet (80 mg total) by mouth every 12 (twelve) hours. 60 tablet 0   tirzepatide (MOUNJARO) 10 MG/0.5ML Pen Inject 10 mg into the skin every Tuesday.     tiZANidine (ZANAFLEX) 2 MG tablet Take 1 tablet (2 mg total) by mouth every 6 (six) hours as needed for muscle spasms. 30 tablet 0   valACYclovir (VALTREX) 1000 MG tablet Take 1,000 mg by mouth daily.     valsartan (DIOVAN) 80 MG tablet Take 1 tablet (80 mg total) by mouth daily. 30 tablet 11   Vibegron (GEMTESA) 75 MG TABS Take 1 tablet by mouth daily.     beta carotene w/minerals (OCUVITE) tablet Take 1 tablet by mouth daily. (Patient not taking: Reported on 08/02/2023)     No current facility-administered medications for this visit.   Facility-Administered Medications Ordered in Other Visits  Medication Dose Route Frequency Provider Last  Rate Last Admin   sodium chloride flush (NS) 0.9 % injection 3 mL  3 mL Intravenous Q12H Adrian Blackwater A, MD        Allergies:   Betadine [povidone iodine], Contrast media [iodinated contrast media], Iodine, Metrizamide, Povidone-iodine, Shellfish allergy, and Hydrocodone-acetaminophen    Social History:   reports that she quit smoking about 14 years ago. Her smoking use included cigarettes. She started smoking about 44 years ago. She has a 30 pack-year smoking history. She has never used smokeless tobacco. She reports that she does not drink alcohol and does not use drugs.   Family History:  family history includes Breast cancer in her cousin; CAD (age of onset: 82) in her  mother; CAD (age of onset: 58) in her brother; Diabetes in her father; Heart disease in her father; Stomach cancer in her maternal grandfather; Throat cancer in her paternal uncle.    ROS:     Review of Systems  Constitutional: Negative.   HENT: Negative.    Eyes: Negative.   Respiratory: Negative.    Gastrointestinal: Negative.   Genitourinary: Negative.   Musculoskeletal: Negative.   Skin: Negative.   Neurological: Negative.   Endo/Heme/Allergies: Negative.   Psychiatric/Behavioral: Negative.    All other systems reviewed and are negative.     All other systems are reviewed and negative.    PHYSICAL EXAM: VS:  BP (!) 122/90   Pulse 83   Ht 5\' 1"  (1.549 m)   Wt 179 lb 9.6 oz (81.5 kg)   SpO2 97%   BMI 33.94 kg/m  , BMI Body mass index is 33.94 kg/m. Last weight:  Wt Readings from Last 3 Encounters:  08/02/23 179 lb 9.6 oz (81.5 kg)  08/01/23 171 lb (77.6 kg)  07/19/23 180 lb (81.6 kg)     Physical Exam Constitutional:      Appearance: Normal appearance.  Cardiovascular:     Rate and Rhythm: Normal rate and regular rhythm.     Heart sounds: Normal heart sounds.  Pulmonary:     Effort: Pulmonary effort is normal.     Breath sounds: Normal breath sounds.  Musculoskeletal:     Right lower  leg: No edema.     Left lower leg: No edema.  Neurological:     Mental Status: She is alert.       EKG:   Recent Labs: 05/09/2023: ALT 12 05/27/2023: BUN 15; Creatinine, Ser 0.67; Hemoglobin 11.4; Platelets 431; Potassium 4.2; Sodium 136    Lipid Panel    Component Value Date/Time   CHOL 265 (H) 03/22/2023 1115   CHOL 285 (H) 04/23/2015 1144   TRIG 105 03/22/2023 1115   TRIG 140 04/23/2015 1144   HDL 54 03/22/2023 1115   HDL 50 04/23/2015 1144   CHOLHDL 4.9 (H) 03/22/2023 1115   CHOLHDL 2.3 06/17/2020 0450   VLDL 10 06/17/2020 0450   LDLCALC 193 (H) 03/22/2023 1115   LDLCALC 207 (H) 04/23/2015 1144      Other studies Reviewed: Additional studies/ records that were reviewed today include:  Review of the above records demonstrates:      11/23/2016    2:49 PM  PAD Screen  Previous PAD dx? No  Previous surgical procedure? No  Pain with walking? Yes  Subsides with rest? Yes  Feet/toe relief with dangling? Yes  Painful, non-healing ulcers? No  Extremities discolored? No      ASSESSMENT AND PLAN:    ICD-10-CM   1. Primary hypertension  I10    Blood pressure is stable right even though diastolic is elevated but normally she is hypotensive.  Will make no further changes.    2. Acute diastolic CHF (congestive heart failure) (HCC)  I50.31    She stopped taking [place] and is feeling fine thus continue valsartan and Aldactone.    3. Coronary artery disease involving native coronary artery of native heart without angina pectoris  I25.10     4. Mixed hyperlipidemia  E78.2     5. SOB (shortness of breath)  R06.02    Shortness of breath has resolved.       Problem List Items Addressed This Visit       Cardiovascular and Mediastinum   HTN (hypertension) -  Primary   Acute diastolic CHF (congestive heart failure) (HCC)     Other   Hyperlipidemia   SOB (shortness of breath)   Other Visit Diagnoses       Coronary artery disease involving native coronary  artery of native heart without angina pectoris              Disposition:   Return in about 3 months (around 10/31/2023).    Total time spent: 30 minutes  Signed,  Adrian Blackwater, MD  08/02/2023 10:41 AM    [facility]

## 2023-08-03 ENCOUNTER — Other Ambulatory Visit: Payer: Self-pay | Admitting: Pulmonary Disease

## 2023-08-03 DIAGNOSIS — C3490 Malignant neoplasm of unspecified part of unspecified bronchus or lung: Secondary | ICD-10-CM

## 2023-08-03 LAB — CYTOLOGY - NON PAP

## 2023-08-03 LAB — SURGICAL PATHOLOGY

## 2023-08-03 NOTE — Progress Notes (Signed)
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  Patient is aware of results.  Will proceed with Thoracic Surgery evaluation by Dr. Edwina Barth.  If not a surgical candidate or if patient declines surgery will consider SBRT.  Gailen Shelter, MD Advanced Bronchoscopy PCCM  [facility]

## 2023-08-10 DIAGNOSIS — M170 Bilateral primary osteoarthritis of knee: Secondary | ICD-10-CM | POA: Diagnosis not present

## 2023-08-16 ENCOUNTER — Institutional Professional Consult (permissible substitution): Payer: Medicare Other | Admitting: Thoracic Surgery (Cardiothoracic Vascular Surgery)

## 2023-08-16 ENCOUNTER — Encounter: Payer: Self-pay | Admitting: Thoracic Surgery (Cardiothoracic Vascular Surgery)

## 2023-08-16 ENCOUNTER — Other Ambulatory Visit: Payer: Self-pay | Admitting: Thoracic Surgery (Cardiothoracic Vascular Surgery)

## 2023-08-16 VITALS — BP 99/61 | HR 88 | Resp 20 | Wt 173.2 lb

## 2023-08-16 DIAGNOSIS — C3490 Malignant neoplasm of unspecified part of unspecified bronchus or lung: Secondary | ICD-10-CM

## 2023-08-16 DIAGNOSIS — C3492 Malignant neoplasm of unspecified part of left bronchus or lung: Secondary | ICD-10-CM | POA: Diagnosis not present

## 2023-08-16 NOTE — H&P (View-Only) (Signed)
PCP is Tammy [name]., MD Referring Provider is Tammy Saner, MD  Chief Complaint  Patient presents with   Mucionous adenocarcinoma of lung    New patient consultation, chest CT 12/19, super D 1/13, Bronch 1/15, PFTs 10/15    HPI: Tammy [name] is sent for consultation regarding a left lower lobe adenocarcinoma.  Tammy [name] is a 73 year old woman with a history of tobacco abuse (30 pack years prior to quitting in 2010), COPD, aortic atherosclerosis, coronary atherosclerosis, HFpEF, paroxysmal atrial fibrillation, hiatal hernia, hypertension, hyperlipidemia, fibromyalgia, degenerative joint disease, type 2 diabetes, obstructive sleep apnea on CPAP, obesity, and an adenocarcinoma of the left lower lobe.  First noted to have a left lower lobe groundglass opacity on a low-dose CT for lung cancer screening about 3 years ago.  She has been followed since then.  She had a PET in 2023 which showed no significant activity in the nodule or any lymph nodes.  She had a biopsy in 2023 that was nondiagnostic.  More recently Tammy CT showed an increase in size and also a more prominent solid component than previously.  She underwent a navigational bronchoscopy by Dr. Jayme Cloud and biopsy showed a mucinous adenocarcinoma.  She can walk about a mile if she is using a push cart or half a mile without it.  She can go up a flight of stairs without having to stop because of shortness of breath.  She denies any chest pain, pressure, or tightness.  Not aware of any recent episodes of atrial fibrillation.  Denies any unusual headaches or visual changes.  She has lost 100 pounds in 18 months on Mounjaro.  She has lost about 15 pounds over the past 3 months.  Zubrod Score: At the time of surgery this patient's most appropriate activity status/level should be described as: []     0    Normal activity, no symptoms [x]     1    Restricted in physical strenuous activity but ambulatory, able to do out light work []      2    Ambulatory and capable of self care, unable to do work activities, up and about >50 % of waking hours                              []     3    Only limited self care, in bed greater than 50% of waking hours []     4    Completely disabled, no self care, confined to bed or chair []     5    Moribund  Past Medical History:  Diagnosis Date   Aortic atherosclerosis (HCC)    Atrial fibrillation (HCC)    a.) CHA2DS2VASc = 5 (age, CHF, HTN, vascular disease history, T2DM);  b.) rate/rhythm maintained on oral diltiazem + sotolol; chronically anticoagulated with apixaban   CAD (coronary artery disease)    a.) cCTA 08/21/2014: Ca2+ = 873 (99th %ile); b.) cCTA 04/10/2023: Ca2+ = 733.7 (93rd %ile; 50-60% mRCA, 50% pLAD, 60% mLCx); c.) LHC 04/24/2023: 45% p-mLAD - med mgmt   CHF (congestive heart failure) (HCC)    a.) TTE 06/17/2020: EF 50-55%, LV dil, mild LVH, mild RVE, mod BAE, G1DD; b.) TTE 12/19/2022: EF >55%, mod LAE, triv TR/PR, G1DD   Cholelithiasis    Chronic calcific pancreatitis (HCC)    Complication of anesthesia    COPD (chronic obstructive pulmonary disease) (HCC)    DDD (degenerative disc disease),  lumbar    a.) s/p L3-L5 fusion   DJD (degenerative joint disease)    Encephalitis    Fibromyalgia    Hepatic steatosis    Hiatal hernia    History of kidney stones    HTN (hypertension)    Hyperlipidemia    Lung nodule 10/26/2021   a.) LDCT 10/26/21: 10.5 mm macrolob/slightly spiculated LLL; b.) PET CT 11/09/21: not FCG avid (? 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; f.) CT chest 07/05/23: 1.4x3.0 LLL nod with 1.7cm solid comp   OAB (overactive bladder)    a.) on vibegron + fesoterodine   On apixaban therapy    OSA on CPAP    PONV (postoperative nausea and vomiting)    T2DM (type 2  diabetes mellitus) (HCC)     Past Surgical History:  Procedure Laterality Date   APPENDECTOMY     BACK SURGERY  2022   BREAST CYST EXCISION     BREAST EXCISIONAL BIOPSY Left 2004   COLONOSCOPY WITH PROPOFOL N/A 11/26/2020   Procedure: COLONOSCOPY WITH PROPOFOL;  Surgeon: Jeani Hawking, MD;  Location: [facility];  Service: Endoscopy;  Laterality: N/A;   COLONOSCOPY WITH PROPOFOL N/A 02/16/2023   Procedure: COLONOSCOPY WITH PROPOFOL;  Surgeon: Jeani Hawking, MD;  Location: [facility];  Service: Gastroenterology;  Laterality: N/A;   DIAGNOSTIC LAPAROSCOPY     HEMOSTASIS CLIP PLACEMENT  11/26/2020   Procedure: HEMOSTASIS CLIP PLACEMENT;  Surgeon: Jeani Hawking, MD;  Location: [facility];  Service: Endoscopy;;   KNEE ARTHROSCOPY     LEFT HEART CATH AND CORONARY ANGIOGRAPHY N/A 04/24/2023   Procedure: LEFT HEART CATH AND CORONARY ANGIOGRAPHY;  Surgeon: Laurier Nancy, MD;  Location: [facility];  Service: Cardiovascular;  Laterality: N/A;   POLYPECTOMY  11/26/2020   Procedure: POLYPECTOMY;  Surgeon: Jeani Hawking, MD;  Location: [facility];  Service: Endoscopy;;   POLYPECTOMY  02/16/2023   Procedure: POLYPECTOMY;  Surgeon: Jeani Hawking, MD;  Location: [facility];  Service: Gastroenterology;;   SUBMUCOSAL TATTOO INJECTION  11/26/2020   Procedure: SUBMUCOSAL TATTOO INJECTION;  Surgeon: Jeani Hawking, MD;  Location: [facility];  Service: Endoscopy;;   TEE WITHOUT CARDIOVERSION N/A 06/29/2020   Procedure: TRANSESOPHAGEAL ECHOCARDIOGRAM (TEE) with DCCV;  Surgeon: Laurier Nancy, MD;  Location: [facility];  Service: Cardiovascular;  Laterality: N/A;   TOTAL KNEE ARTHROPLASTY Left 05/18/2023   Procedure: LEFT TOTAL KNEE ARTHROPLASTY;  Surgeon: Kathryne Hitch, MD;  Location: [facility];  Service: Orthopedics;  Laterality: Left;    Family History  Problem Relation Age of Onset   CAD Mother 72   Diabetes Father    Heart disease Father    CAD Brother 66   Throat cancer  Paternal Uncle    Stomach cancer Maternal Grandfather    Breast cancer Cousin     Social History Social History   Tobacco Use   Smoking status: Former    Current packs/day: 0.00    Average packs/day: 1 pack/day for 30.0 years (30.0 ttl pk-yrs)    Types: Cigarettes    Start date: 11/02/1978    Quit date: 11/01/2008    Years since quitting: 14.7   Smokeless tobacco: Never  Vaping Use   Vaping status: Never Used  Substance Use Topics   Alcohol use: No    Alcohol/week: 0.0 standard drinks  of alcohol   Drug use: No    Current Outpatient Medications  Medication Sig Dispense Refill   albuterol (VENTOLIN HFA) 108 (90 Base) MCG/ACT inhaler Inhale 2 puffs into the lungs every 6 (six) hours as needed. 8 g 2   apixaban (ELIQUIS) 5 MG TABS tablet Take 5 mg by mouth 2 (two) times daily.     diltiazem (CARDIZEM CD) 240 MG 24 hr capsule Take 1 capsule (240 mg total) by mouth daily. 90 capsule 0   DULoxetine (CYMBALTA) 60 MG capsule Take 60 mg by mouth daily.     EPINEPHrine 0.3 mg/0.3 mL IJ SOAJ injection Inject 0.3 mg into the muscle as needed for anaphylaxis.     Evolocumab (REPATHA SURECLICK) 140 MG/ML SOAJ Inject 140 mg into the skin every 14 (fourteen) days.     fesoterodine (TOVIAZ) 4 MG TB24 tablet Take 4 mg by mouth daily.     Fluticasone-Umeclidin-Vilant (TRELEGY ELLIPTA) 100-62.5-25 MCG/ACT AEPB Inhale 1 Dose into the lungs daily. 180 each 3   metFORMIN (GLUCOPHAGE) 500 MG tablet Take 500 mg by mouth 2 (two) times daily.     sotalol (BETAPACE) 80 MG tablet Take 1 tablet (80 mg total) by mouth every 12 (twelve) hours. 60 tablet 0   tirzepatide (MOUNJARO) 10 MG/0.5ML Pen Inject 10 mg into the skin every Tuesday.     valACYclovir (VALTREX) 1000 MG tablet Take 1,000 mg by mouth daily.     valsartan (DIOVAN) 80 MG tablet Take 1 tablet (80 mg total) by mouth daily. 30 tablet 11   Vibegron (GEMTESA) 75 MG TABS Take 1 tablet by mouth daily.     No current facility-administered medications  for this visit.   Facility-Administered Medications Ordered in Other Visits  Medication Dose Route Frequency Provider Last Rate Last Admin   sodium chloride flush (NS) 0.9 % injection 3 mL  3 mL Intravenous Q12H Laurier Nancy, MD        Allergies  Allergen Reactions   Betadine [Povidone Iodine] Anaphylaxis   Contrast Media [Iodinated Contrast Media] Anaphylaxis   Iodine Anaphylaxis   Metrizamide Anaphylaxis   Povidone-Iodine Anaphylaxis   Shellfish Allergy Anaphylaxis   Hydrocodone-Acetaminophen Nausea Only    Review of Systems  Constitutional:  Positive for appetite change and unexpected weight change (See HPI). Negative for activity change.  HENT:  Negative for trouble swallowing and voice change.   Eyes:  Negative for visual disturbance.  Respiratory:  Positive for apnea (CPAP), shortness of breath and wheezing (Rarely needs rescue inhaler).   Cardiovascular:  Positive for leg swelling (Varicose veins). Negative for chest pain.  Neurological:  Negative for seizures and weakness.  Hematological:  Negative for adenopathy. Bruises/bleeds easily.  All other systems reviewed and are negative.   BP 99/61 (BP Location: Left Arm, Patient Position: Sitting, Cuff Size: Normal)   Pulse 88   Resp 20   Wt 173 lb 3.2 oz (78.6 kg)   SpO2 98% Comment: RA  BMI 32.73 kg/m  Physical Exam Vitals reviewed.  Constitutional:      General: She is not in acute distress.    Appearance: She is obese.  HENT:     Head: Normocephalic and atraumatic.  Eyes:     General: No scleral icterus.    Extraocular Movements: Extraocular movements intact.  Cardiovascular:     Rate and Rhythm: Normal rate and regular rhythm.     Heart sounds: Normal heart sounds. No murmur heard.    No friction rub. No gallop.  Pulmonary:     Effort: Pulmonary effort is normal. No respiratory distress.     Breath sounds: Normal breath sounds. No wheezing or rales.  Abdominal:     General: There is no distension.      Palpations: Abdomen is soft.  Musculoskeletal:     Cervical back: Neck supple.     Right lower leg: Edema (Varicosities and spider veins bilaterally) present.     Left lower leg: Edema present.  Lymphadenopathy:     Cervical: No cervical adenopathy.  Skin:    General: Skin is warm and dry.  Neurological:     General: No focal deficit present.     Mental Status: She is alert and oriented to person, place, and time.     Cranial Nerves: No cranial nerve deficit.     Motor: No weakness.    Diagnostic Tests:  CT CHEST WITHOUT CONTRAST   TECHNIQUE: Multidetector CT imaging of the chest was performed following the standard protocol without IV contrast.   RADIATION DOSE REDUCTION: This exam was performed according to the departmental dose-optimization program which includes automated exposure control, adjustment of the mA and/or kV according to patient size and/or use of iterative reconstruction technique.   COMPARISON:  12/25/2022, of 11/13/2022, 03/06/2022, 10/26/2021 and PET 11/09/2021.   FINDINGS: Cardiovascular: Atherosclerotic calcification of the aorta, aortic valve and coronary arteries. Heart is at the upper limits of normal in size to mildly enlarged. No pericardial effusion.   Mediastinum/Nodes: Low left paratracheal lymph node measures 10 mm (3/54), unchanged from 10/26/2021. Otherwise, no pathologically enlarged mediastinal or axillary lymph nodes. Hilar regions are difficult to definitively evaluate without IV contrast. Esophagus is grossly unremarkable.   Lungs/Pleura: Centrilobular and paraseptal emphysema. Subsolid mass in the anterior left lower lobe overall measures 1.4 x 3.0 cm with an internal solid component measuring up to 1.7 cm (5/95), increased from 0.8 x 1.4 cm and 8 mm, respectively, on 10/26/2021. No pleural fluid. Airway is unremarkable.   Upper Abdomen: Image quality is degraded by artifact from the patient's arms. Gallstones. Calcifications in  the pancreas. Gastrohepatic ligament lymph nodes measure up to 13 mm, similar to 10/26/2021. Visualized portions of the liver, gallbladder, adrenal glands, kidneys, spleen, pancreas, stomach and bowel are otherwise grossly unremarkable. No upper abdominal adenopathy.   Musculoskeletal: Degenerative changes in the spine.   IMPRESSION: 1. Subsolid mass in the left lower lobe has enlarged from 10/26/2021 and is most indicative of adenocarcinoma. 2. 10 mm low left paratracheal lymph node, likely similar to 10/26/2021. Recommend attention on follow-up imaging. 3. Cholelithiasis. 4. Chronic calcific pancreatitis. 5. Aortic atherosclerosis (ICD10-I70.0). Coronary artery calcification. 6.  Emphysema (ICD10-J43.9).     Electronically Signed   By: Tammy [name] M.D.   On: 07/20/2023 11:34 I personally reviewed the CT images.  There is a 1.4 x 3 cm groundglass opacity with a central 8 x 1.4 mm solid component in the left lower lobe.  Borderline enlarged left paratracheal node.  Severe aortic and coronary atherosclerosis.  Pulmonary function testing 04/20/2022 FVC 2.32 (91%) FEV1 1.48 (77%) No significant change with bronchodilator DLCO 20.51 (121%)  Impression: Tammy [name] is a 73 year old woman with a history of tobacco abuse (30 pack years prior to quitting in 2010), COPD, aortic atherosclerosis, coronary atherosclerosis, HFpEF, paroxysmal atrial fibrillation, hiatal hernia, hypertension, hyperlipidemia, fibromyalgia, degenerative joint disease, type 2 diabetes, obstructive sleep apnea on CPAP, obesity, and an adenocarcinoma of the left lower lobe.  Adenocarcinoma left lower lobe-mixed density nodule that is increased  in size over time.  Now maximum diameter of 3 cm with a solid component.  Borderline left paratracheal adenopathy on CT (not significantly changed from 2023) likely stage Ia (T1, N0) disease.  Cannot completely rule out the possibility of T1, N2, stage IIIa disease.  I  think we should do a PET to rule out activity in that paratracheal node before putting Tammy through an operation.  Will go ahead and schedule that.  I discussed with Tammy [name] and Tammy [name] in the treatment options for stage I disease.  Those include surgery and stereotactic radiation.  We discussed the relative advantages and disadvantages of each approach.  She is interested in surgical resection.  I described the postoperative procedure of a robotic assisted left lower lobectomy to the [facility].  I informed them of the general nature of the procedure including the need for general anesthesia, the incision is to be used, the use of the surgical robot, the use of a drainage tube postoperatively, the expected hospital stay and the overall recovery.  I informed them of the indications, risks, benefits, and alternatives.  She understands the risks include, but are not limited to death, MI, DVT, PE, bleeding, possible need for transfusion, possible need for conversion to open, infection, prolonged air leak, cardiac arrhythmias, rib fractures, as well as possibility of other unforeseeable complications.  She has extensive coronary calcification on CT.  She saw Tammy [name] recently and has not been having any anginal symptoms.  I will just check with him to make sure no additional testing is necessary prior to surgery.  There were always be some cardiac risk, just want to make sure it is not excessive.   Plan: PET/CT to complete clinical staging Will check with Tammy [name] to ensure that no additional cardiac workup is necessary Will call to schedule robotic assisted left lower lobectomy once we have those 2 issues addressed.  I spent over 45 minutes in review of records, images, and in consultation with Tammy [name] today. Tammy Slot, MD [facility] [phone]

## 2023-08-16 NOTE — Progress Notes (Signed)
PCP is Cleatis Polka., MD Referring Provider is Salena Saner, MD  Chief Complaint  Patient presents with   Mucionous adenocarcinoma of lung    New patient consultation, chest CT 12/19, super D 1/13, Bronch 1/15, PFTs 10/15    HPI: Tammy [name] is sent for consultation regarding a left lower lobe adenocarcinoma.  Tammy [name] is a 73 year old woman with a history of tobacco abuse (30 pack years prior to quitting in 2010), COPD, aortic atherosclerosis, coronary atherosclerosis, HFpEF, paroxysmal atrial fibrillation, hiatal hernia, hypertension, hyperlipidemia, fibromyalgia, degenerative joint disease, type 2 diabetes, obstructive sleep apnea on CPAP, obesity, and an adenocarcinoma of the left lower lobe.  First noted to have a left lower lobe groundglass opacity on a low-dose CT for lung cancer screening about 3 years ago.  She has been followed since then.  She had a PET in 2023 which showed no significant activity in the nodule or any lymph nodes.  She had a biopsy in 2023 that was nondiagnostic.  More recently her CT showed an increase in size and also a more prominent solid component than previously.  She underwent a navigational bronchoscopy by Dr. Jayme Cloud and biopsy showed a mucinous adenocarcinoma.  She can walk about a mile if she is using a push cart or half a mile without it.  She can go up a flight of stairs without having to stop because of shortness of breath.  She denies any chest pain, pressure, or tightness.  Not aware of any recent episodes of atrial fibrillation.  Denies any unusual headaches or visual changes.  She has lost 100 pounds in 18 months on Mounjaro.  She has lost about 15 pounds over the past 3 months.  Zubrod Score: At the time of surgery this patient's most appropriate activity status/level should be described as: []     0    Normal activity, no symptoms [x]     1    Restricted in physical strenuous activity but ambulatory, able to do out light work []      2    Ambulatory and capable of self care, unable to do work activities, up and about >50 % of waking hours                              []     3    Only limited self care, in bed greater than 50% of waking hours []     4    Completely disabled, no self care, confined to bed or chair []     5    Moribund  Past Medical History:  Diagnosis Date   Aortic atherosclerosis (HCC)    Atrial fibrillation (HCC)    a.) CHA2DS2VASc = 5 (age, CHF, HTN, vascular disease history, T2DM);  b.) rate/rhythm maintained on oral diltiazem + sotolol; chronically anticoagulated with apixaban   CAD (coronary artery disease)    a.) cCTA 08/21/2014: Ca2+ = 873 (99th %ile); b.) cCTA 04/10/2023: Ca2+ = 733.7 (93rd %ile; 50-60% mRCA, 50% pLAD, 60% mLCx); c.) LHC 04/24/2023: 45% p-mLAD - med mgmt   CHF (congestive heart failure) (HCC)    a.) TTE 06/17/2020: EF 50-55%, LV dil, mild LVH, mild RVE, mod BAE, G1DD; b.) TTE 12/19/2022: EF >55%, mod LAE, triv TR/PR, G1DD   Cholelithiasis    Chronic calcific pancreatitis (HCC)    Complication of anesthesia    COPD (chronic obstructive pulmonary disease) (HCC)    DDD (degenerative disc disease),  lumbar    a.) s/p L3-L5 fusion   DJD (degenerative joint disease)    Encephalitis    Fibromyalgia    Hepatic steatosis    Hiatal hernia    History of kidney stones    HTN (hypertension)    Hyperlipidemia    Lung nodule 10/26/2021   a.) LDCT 10/26/21: 10.5 mm macrolob/slightly spiculated LLL; b.) PET CT 11/09/21: not FCG avid (? 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; f.) CT chest 07/05/23: 1.4x3.0 LLL nod with 1.7cm solid comp   OAB (overactive bladder)    a.) on vibegron + fesoterodine   On apixaban therapy    OSA on CPAP    PONV (postoperative nausea and vomiting)    T2DM (type 2  diabetes mellitus) (HCC)     Past Surgical History:  Procedure Laterality Date   APPENDECTOMY     BACK SURGERY  2022   BREAST CYST EXCISION     BREAST EXCISIONAL BIOPSY Left 2004   COLONOSCOPY WITH PROPOFOL N/A 11/26/2020   Procedure: COLONOSCOPY WITH PROPOFOL;  Surgeon: Jeani Hawking, MD;  Location: [facility];  Service: Endoscopy;  Laterality: N/A;   COLONOSCOPY WITH PROPOFOL N/A 02/16/2023   Procedure: COLONOSCOPY WITH PROPOFOL;  Surgeon: Jeani Hawking, MD;  Location: [facility];  Service: Gastroenterology;  Laterality: N/A;   DIAGNOSTIC LAPAROSCOPY     HEMOSTASIS CLIP PLACEMENT  11/26/2020   Procedure: HEMOSTASIS CLIP PLACEMENT;  Surgeon: Jeani Hawking, MD;  Location: [facility];  Service: Endoscopy;;   KNEE ARTHROSCOPY     LEFT HEART CATH AND CORONARY ANGIOGRAPHY N/A 04/24/2023   Procedure: LEFT HEART CATH AND CORONARY ANGIOGRAPHY;  Surgeon: Laurier Nancy, MD;  Location: [facility];  Service: Cardiovascular;  Laterality: N/A;   POLYPECTOMY  11/26/2020   Procedure: POLYPECTOMY;  Surgeon: Jeani Hawking, MD;  Location: [facility];  Service: Endoscopy;;   POLYPECTOMY  02/16/2023   Procedure: POLYPECTOMY;  Surgeon: Jeani Hawking, MD;  Location: [facility];  Service: Gastroenterology;;   SUBMUCOSAL TATTOO INJECTION  11/26/2020   Procedure: SUBMUCOSAL TATTOO INJECTION;  Surgeon: Jeani Hawking, MD;  Location: [facility];  Service: Endoscopy;;   TEE WITHOUT CARDIOVERSION N/A 06/29/2020   Procedure: TRANSESOPHAGEAL ECHOCARDIOGRAM (TEE) with DCCV;  Surgeon: Laurier Nancy, MD;  Location: [facility];  Service: Cardiovascular;  Laterality: N/A;   TOTAL KNEE ARTHROPLASTY Left 05/18/2023   Procedure: LEFT TOTAL KNEE ARTHROPLASTY;  Surgeon: Kathryne Hitch, MD;  Location: [facility];  Service: Orthopedics;  Laterality: Left;    Family History  Problem Relation Age of Onset   CAD Mother 64   Diabetes Father    Heart disease Father    CAD Brother 34   Throat cancer  Paternal Uncle    Stomach cancer Maternal Grandfather    Breast cancer Cousin     Social History Social History   Tobacco Use   Smoking status: Former    Current packs/day: 0.00    Average packs/day: 1 pack/day for 30.0 years (30.0 ttl pk-yrs)    Types: Cigarettes    Start date: 11/02/1978    Quit date: 11/01/2008    Years since quitting: 14.7   Smokeless tobacco: Never  Vaping Use   Vaping status: Never Used  Substance Use Topics   Alcohol use: No    Alcohol/week: 0.0 standard drinks  of alcohol   Drug use: No    Current Outpatient Medications  Medication Sig Dispense Refill   albuterol (VENTOLIN HFA) 108 (90 Base) MCG/ACT inhaler Inhale 2 puffs into the lungs every 6 (six) hours as needed. 8 g 2   apixaban (ELIQUIS) 5 MG TABS tablet Take 5 mg by mouth 2 (two) times daily.     diltiazem (CARDIZEM CD) 240 MG 24 hr capsule Take 1 capsule (240 mg total) by mouth daily. 90 capsule 0   DULoxetine (CYMBALTA) 60 MG capsule Take 60 mg by mouth daily.     EPINEPHrine 0.3 mg/0.3 mL IJ SOAJ injection Inject 0.3 mg into the muscle as needed for anaphylaxis.     Evolocumab (REPATHA SURECLICK) 140 MG/ML SOAJ Inject 140 mg into the skin every 14 (fourteen) days.     fesoterodine (TOVIAZ) 4 MG TB24 tablet Take 4 mg by mouth daily.     Fluticasone-Umeclidin-Vilant (TRELEGY ELLIPTA) 100-62.5-25 MCG/ACT AEPB Inhale 1 Dose into the lungs daily. 180 each 3   metFORMIN (GLUCOPHAGE) 500 MG tablet Take 500 mg by mouth 2 (two) times daily.     sotalol (BETAPACE) 80 MG tablet Take 1 tablet (80 mg total) by mouth every 12 (twelve) hours. 60 tablet 0   tirzepatide (MOUNJARO) 10 MG/0.5ML Pen Inject 10 mg into the skin every Tuesday.     valACYclovir (VALTREX) 1000 MG tablet Take 1,000 mg by mouth daily.     valsartan (DIOVAN) 80 MG tablet Take 1 tablet (80 mg total) by mouth daily. 30 tablet 11   Vibegron (GEMTESA) 75 MG TABS Take 1 tablet by mouth daily.     No current facility-administered medications  for this visit.   Facility-Administered Medications Ordered in Other Visits  Medication Dose Route Frequency Provider Last Rate Last Admin   sodium chloride flush (NS) 0.9 % injection 3 mL  3 mL Intravenous Q12H Laurier Nancy, MD        Allergies  Allergen Reactions   Betadine [Povidone Iodine] Anaphylaxis   Contrast Media [Iodinated Contrast Media] Anaphylaxis   Iodine Anaphylaxis   Metrizamide Anaphylaxis   Povidone-Iodine Anaphylaxis   Shellfish Allergy Anaphylaxis   Hydrocodone-Acetaminophen Nausea Only    Review of Systems  Constitutional:  Positive for appetite change and unexpected weight change (See HPI). Negative for activity change.  HENT:  Negative for trouble swallowing and voice change.   Eyes:  Negative for visual disturbance.  Respiratory:  Positive for apnea (CPAP), shortness of breath and wheezing (Rarely needs rescue inhaler).   Cardiovascular:  Positive for leg swelling (Varicose veins). Negative for chest pain.  Neurological:  Negative for seizures and weakness.  Hematological:  Negative for adenopathy. Bruises/bleeds easily.  All other systems reviewed and are negative.   BP 99/61 (BP Location: Left Arm, Patient Position: Sitting, Cuff Size: Normal)   Pulse 88   Resp 20   Wt 173 lb 3.2 oz (78.6 kg)   SpO2 98% Comment: RA  BMI 32.73 kg/m  Physical Exam Vitals reviewed.  Constitutional:      General: She is not in acute distress.    Appearance: She is obese.  HENT:     Head: Normocephalic and atraumatic.  Eyes:     General: No scleral icterus.    Extraocular Movements: Extraocular movements intact.  Cardiovascular:     Rate and Rhythm: Normal rate and regular rhythm.     Heart sounds: Normal heart sounds. No murmur heard.    No friction rub. No gallop.  Pulmonary:     Effort: Pulmonary effort is normal. No respiratory distress.     Breath sounds: Normal breath sounds. No wheezing or rales.  Abdominal:     General: There is no distension.      Palpations: Abdomen is soft.  Musculoskeletal:     Cervical back: Neck supple.     Right lower leg: Edema (Varicosities and spider veins bilaterally) present.     Left lower leg: Edema present.  Lymphadenopathy:     Cervical: No cervical adenopathy.  Skin:    General: Skin is warm and dry.  Neurological:     General: No focal deficit present.     Mental Status: She is alert and oriented to person, place, and time.     Cranial Nerves: No cranial nerve deficit.     Motor: No weakness.    Diagnostic Tests:  CT CHEST WITHOUT CONTRAST   TECHNIQUE: Multidetector CT imaging of the chest was performed following the standard protocol without IV contrast.   RADIATION DOSE REDUCTION: This exam was performed according to the departmental dose-optimization program which includes automated exposure control, adjustment of the mA and/or kV according to patient size and/or use of iterative reconstruction technique.   COMPARISON:  12/25/2022, of 11/13/2022, 03/06/2022, 10/26/2021 and PET 11/09/2021.   FINDINGS: Cardiovascular: Atherosclerotic calcification of the aorta, aortic valve and coronary arteries. Heart is at the upper limits of normal in size to mildly enlarged. No pericardial effusion.   Mediastinum/Nodes: Low left paratracheal lymph node measures 10 mm (3/54), unchanged from 10/26/2021. Otherwise, no pathologically enlarged mediastinal or axillary lymph nodes. Hilar regions are difficult to definitively evaluate without IV contrast. Esophagus is grossly unremarkable.   Lungs/Pleura: Centrilobular and paraseptal emphysema. Subsolid mass in the anterior left lower lobe overall measures 1.4 x 3.0 cm with an internal solid component measuring up to 1.7 cm (5/95), increased from 0.8 x 1.4 cm and 8 mm, respectively, on 10/26/2021. No pleural fluid. Airway is unremarkable.   Upper Abdomen: Image quality is degraded by artifact from the patient's arms. Gallstones. Calcifications in  the pancreas. Gastrohepatic ligament lymph nodes measure up to 13 mm, similar to 10/26/2021. Visualized portions of the liver, gallbladder, adrenal glands, kidneys, spleen, pancreas, stomach and bowel are otherwise grossly unremarkable. No upper abdominal adenopathy.   Musculoskeletal: Degenerative changes in the spine.   IMPRESSION: 1. Subsolid mass in the left lower lobe has enlarged from 10/26/2021 and is most indicative of adenocarcinoma. 2. 10 mm low left paratracheal lymph node, likely similar to 10/26/2021. Recommend attention on follow-up imaging. 3. Cholelithiasis. 4. Chronic calcific pancreatitis. 5. Aortic atherosclerosis (ICD10-I70.0). Coronary artery calcification. 6.  Emphysema (ICD10-J43.9).     Electronically Signed   By: Leanna Battles M.D.   On: 07/20/2023 11:34 I personally reviewed the CT images.  There is a 1.4 x 3 cm groundglass opacity with a central 8 x 1.4 mm solid component in the left lower lobe.  Borderline enlarged left paratracheal node.  Severe aortic and coronary atherosclerosis.  Pulmonary function testing 04/20/2022 FVC 2.32 (91%) FEV1 1.48 (77%) No significant change with bronchodilator DLCO 20.51 (121%)  Impression: Tammy [name] is a 73 year old woman with a history of tobacco abuse (30 pack years prior to quitting in 2010), COPD, aortic atherosclerosis, coronary atherosclerosis, HFpEF, paroxysmal atrial fibrillation, hiatal hernia, hypertension, hyperlipidemia, fibromyalgia, degenerative joint disease, type 2 diabetes, obstructive sleep apnea on CPAP, obesity, and an adenocarcinoma of the left lower lobe.  Adenocarcinoma left lower lobe-mixed density nodule that is increased  in size over time.  Now maximum diameter of 3 cm with a solid component.  Borderline left paratracheal adenopathy on CT (not significantly changed from 2023) likely stage Ia (T1, N0) disease.  Cannot completely rule out the possibility of T1, N2, stage IIIa disease.  I  think we should do a PET to rule out activity in that paratracheal node before putting her through an operation.  Will go ahead and schedule that.  I discussed with Tammy [name] and her husband in the treatment options for stage I disease.  Those include surgery and stereotactic radiation.  We discussed the relative advantages and disadvantages of each approach.  She is interested in surgical resection.  I described the postoperative procedure of a robotic assisted left lower lobectomy to the [facility].  I informed them of the general nature of the procedure including the need for general anesthesia, the incision is to be used, the use of the surgical robot, the use of a drainage tube postoperatively, the expected hospital stay and the overall recovery.  I informed them of the indications, risks, benefits, and alternatives.  She understands the risks include, but are not limited to death, MI, DVT, PE, bleeding, possible need for transfusion, possible need for conversion to open, infection, prolonged air leak, cardiac arrhythmias, rib fractures, as well as possibility of other unforeseeable complications.  She has extensive coronary calcification on CT.  She saw Dr. Welton Flakes recently and has not been having any anginal symptoms.  I will just check with him to make sure no additional testing is necessary prior to surgery.  There were always be some cardiac risk, just want to make sure it is not excessive.   Plan: PET/CT to complete clinical staging Will check with Dr. Welton Flakes to ensure that no additional cardiac workup is necessary Will call to schedule robotic assisted left lower lobectomy once we have those 2 issues addressed.  I spent over 45 minutes in review of records, images, and in consultation with Tammy [name] today. Loreli Slot, MD [facility] [phone]

## 2023-08-17 ENCOUNTER — Other Ambulatory Visit: Payer: Self-pay | Admitting: *Deleted

## 2023-08-17 ENCOUNTER — Encounter: Payer: Self-pay | Admitting: *Deleted

## 2023-08-17 DIAGNOSIS — C3490 Malignant neoplasm of unspecified part of unspecified bronchus or lung: Secondary | ICD-10-CM

## 2023-08-17 DIAGNOSIS — Z5181 Encounter for therapeutic drug level monitoring: Secondary | ICD-10-CM

## 2023-08-23 ENCOUNTER — Ambulatory Visit (INDEPENDENT_AMBULATORY_CARE_PROVIDER_SITE_OTHER): Payer: Medicare Other | Admitting: Cardiovascular Disease

## 2023-08-23 ENCOUNTER — Encounter: Payer: Self-pay | Admitting: Cardiovascular Disease

## 2023-08-23 VITALS — BP 121/73 | HR 82 | Ht 60.0 in | Wt 167.8 lb

## 2023-08-23 DIAGNOSIS — I5031 Acute diastolic (congestive) heart failure: Secondary | ICD-10-CM

## 2023-08-23 DIAGNOSIS — Z01810 Encounter for preprocedural cardiovascular examination: Secondary | ICD-10-CM | POA: Diagnosis not present

## 2023-08-23 DIAGNOSIS — Z0130 Encounter for examination of blood pressure without abnormal findings: Secondary | ICD-10-CM

## 2023-08-23 DIAGNOSIS — I4891 Unspecified atrial fibrillation: Secondary | ICD-10-CM

## 2023-08-23 DIAGNOSIS — I872 Venous insufficiency (chronic) (peripheral): Secondary | ICD-10-CM

## 2023-08-23 NOTE — Progress Notes (Addendum)
 Cardiology Office Note   Date:  08/23/2023   ID:  Tammy, [name] 1951/06/25, MRN [id number]  PCP:  Loreli Elsie [name], MD  Cardiologist:  Denyse Bathe, MD      History of Present Illness: Tammy [name] is a 73 y.o. female who presents for No chief complaint on file.   Has left lung cancer on biopsy needing clearance prior to surgery stage 1. Has SOB on exertion.      Past Medical History:  Diagnosis Date   Aortic atherosclerosis (HCC)    Atrial fibrillation (HCC)    a.) CHA2DS2VASc = 5 (age, CHF, HTN, vascular disease history, T2DM);  b.) rate/rhythm maintained on oral diltiazem  + sotolol; chronically anticoagulated with apixaban    CAD (coronary artery disease)    a.) cCTA 08/21/2014: Ca2+ = 873 (99th %ile); b.) cCTA 04/10/2023: Ca2+ = 733.7 (93rd %ile; 50-60% mRCA, 50% pLAD, 60% mLCx); c.) LHC 04/24/2023: 45% p-mLAD - med mgmt   CHF (congestive heart failure) (HCC)    a.) TTE 06/17/2020: EF 50-55%, LV dil, mild LVH, mild RVE, mod BAE, G1DD; b.) TTE 12/19/2022: EF >55%, mod LAE, triv TR/PR, G1DD   Cholelithiasis    Chronic calcific pancreatitis (HCC)    Complication of anesthesia    COPD (chronic obstructive pulmonary disease) (HCC)    DDD (degenerative disc disease), lumbar    a.) s/p L3-L5 fusion   DJD (degenerative joint disease)    Encephalitis    Fibromyalgia    Hepatic steatosis    Hiatal hernia    History of kidney stones    HTN (hypertension)    Hyperlipidemia    Lung nodule 10/26/2021   a.) LDCT 10/26/21: 10.5 mm macrolob/slightly spiculated LLL; b.) PET CT 11/09/21: not FCG avid (? 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; f.) CT chest 07/05/23: 1.4x3.0 LLL nod with 1.7cm solid comp   OAB (overactive bladder)    a.) on vibegron + fesoterodine     On apixaban  therapy    OSA on CPAP    PONV (postoperative nausea and vomiting)    T2DM (type 2 diabetes mellitus) (HCC)      Past Surgical History:  Procedure Laterality Date   APPENDECTOMY     BACK SURGERY  2022   BREAST CYST EXCISION     BREAST EXCISIONAL BIOPSY Left 2004   COLONOSCOPY WITH PROPOFOL  N/A 11/26/2020   Procedure: COLONOSCOPY WITH PROPOFOL ;  Surgeon: Rollin Dover, MD;  Location: [facility];  Service: Endoscopy;  Laterality: N/A;   COLONOSCOPY WITH PROPOFOL  N/A 02/16/2023   Procedure: COLONOSCOPY WITH PROPOFOL ;  Surgeon: Rollin Dover, MD;  Location: [facility];  Service: Gastroenterology;  Laterality: N/A;   DIAGNOSTIC LAPAROSCOPY     HEMOSTASIS CLIP PLACEMENT  11/26/2020   Procedure: HEMOSTASIS CLIP PLACEMENT;  Surgeon: Rollin Dover, MD;  Location: [facility];  Service: Endoscopy;;   KNEE ARTHROSCOPY     LEFT HEART CATH AND CORONARY ANGIOGRAPHY N/A 04/24/2023   Procedure: LEFT HEART CATH AND CORONARY ANGIOGRAPHY;  Surgeon: Bathe Denyse LABOR, MD;  Location: [facility];  Service: Cardiovascular;  Laterality: N/A;   POLYPECTOMY  11/26/2020   Procedure: POLYPECTOMY;  Surgeon: Rollin Dover, MD;  Location: [facility];  Service: Endoscopy;;   POLYPECTOMY  02/16/2023   Procedure: POLYPECTOMY;  Surgeon: Rollin Dover, MD;  Location: [facility];  Service: Gastroenterology;;   SUBMUCOSAL TATTOO INJECTION  11/26/2020   Procedure: SUBMUCOSAL TATTOO INJECTION;  Surgeon: Rollin Dover, MD;  Location: [facility];  Service: Endoscopy;;   TEE WITHOUT CARDIOVERSION N/A 06/29/2020   Procedure: TRANSESOPHAGEAL ECHOCARDIOGRAM (TEE) with DCCV;  Surgeon: Fernand Denyse LABOR, MD;  Location: [facility];  Service: Cardiovascular;  Laterality: N/A;   TOTAL KNEE ARTHROPLASTY Left 05/18/2023   Procedure: LEFT TOTAL KNEE ARTHROPLASTY;  Surgeon: Vernetta Lonni GRADE, MD;  Location: [facility];  Service: Orthopedics;  Laterality: Left;     Current Outpatient Medications  Medication  Sig Dispense Refill   albuterol  (VENTOLIN  HFA) 108 (90 Base) MCG/ACT inhaler Inhale 2 puffs into the lungs every 6 (six) hours as needed. 8 g 2   apixaban  (ELIQUIS ) 5 MG TABS tablet Take 5 mg by mouth 2 (two) times daily.     diltiazem  (CARDIZEM  CD) 240 MG 24 hr capsule Take 1 capsule (240 mg total) by mouth daily. 90 capsule 0   DULoxetine  (CYMBALTA ) 60 MG capsule Take 60 mg by mouth daily.     EPINEPHrine  0.3 mg/0.3 mL IJ SOAJ injection Inject 0.3 mg into the muscle as needed for anaphylaxis.     Evolocumab (REPATHA SURECLICK) 140 MG/ML SOAJ Inject 140 mg into the skin every 14 (fourteen) days.     fesoterodine  (TOVIAZ ) 4 MG TB24 tablet Take 4 mg by mouth daily.     Fluticasone -Umeclidin-Vilant (TRELEGY ELLIPTA ) 100-62.5-25 MCG/ACT AEPB Inhale 1 Dose into the lungs daily. 180 each 3   furosemide  (LASIX ) 20 MG tablet Take 20 mg by mouth daily.     isosorbide  mononitrate (IMDUR ) 30 MG 24 hr tablet Take 30 mg by mouth 2 (two) times daily.     metFORMIN  (GLUCOPHAGE ) 500 MG tablet Take 500 mg by mouth 2 (two) times daily.     potassium chloride  (KLOR-CON ) 10 MEQ tablet Take 10 mEq by mouth daily.     sotalol  (BETAPACE ) 80 MG tablet Take 1 tablet (80 mg total) by mouth every 12 (twelve) hours. 60 tablet 0   tirzepatide (MOUNJARO) 12.5 MG/0.5ML Pen Inject 12.5 mg into the skin every Tuesday.     valACYclovir  (VALTREX ) 1000 MG tablet Take 1,000 mg by mouth daily.     valsartan  (DIOVAN ) 320 MG tablet Take 320 mg by mouth daily.     Vibegron (GEMTESA) 75 MG TABS Take 1 tablet by mouth daily.     No current facility-administered medications for this visit.   Facility-Administered Medications Ordered in Other Visits  Medication Dose Route Frequency Provider Last Rate Last Admin   sodium chloride  flush (NS) 0.9 % injection 3 mL  3 mL Intravenous Q12H Fernand Denyse A, MD        Allergies:   Betadine [povidone iodine], Contrast media [iodinated contrast media], Iodine, Metrizamide, Povidone-iodine, and  Shellfish allergy    Social History:   reports that she quit smoking about 14 years ago. Her smoking use included cigarettes. She started smoking about 44 years ago. She has a 30 pack-year smoking history. She has never used smokeless tobacco. She reports that she does not drink alcohol  and does not use drugs.   Family History:  family history includes Breast cancer in her cousin; CAD (age of onset: 55) in her mother; CAD (age of onset: 54) in her brother; Diabetes in her father; Heart disease in her father; Stomach cancer in her maternal grandfather; Throat cancer in her paternal uncle.    ROS:     Review  of Systems  Constitutional: Negative.   HENT: Negative.    Eyes: Negative.   Respiratory: Negative.    Gastrointestinal: Negative.   Genitourinary: Negative.   Musculoskeletal: Negative.   Skin: Negative.   Neurological: Negative.   Endo/Heme/Allergies: Negative.   Psychiatric/Behavioral: Negative.    All other systems reviewed and are negative.     All other systems are reviewed and negative.    PHYSICAL EXAM: VS:  BP 121/73   Pulse 82   Ht 5' (1.524 m)   Wt 167 lb 12.8 oz (76.1 kg)   SpO2 97%   BMI 32.77 kg/m  , BMI Body mass index is 32.77 kg/m. Last weight:  Wt Readings from Last 3 Encounters:  08/23/23 167 lb 12.8 oz (76.1 kg)  08/16/23 173 lb 3.2 oz (78.6 kg)  08/02/23 179 lb 9.6 oz (81.5 kg)     Physical Exam Constitutional:      Appearance: Normal appearance.  Cardiovascular:     Rate and Rhythm: Normal rate and regular rhythm.     Heart sounds: Normal heart sounds.  Pulmonary:     Effort: Pulmonary effort is normal.     Breath sounds: Normal breath sounds.  Musculoskeletal:     Right lower leg: No edema.     Left lower leg: No edema.  Neurological:     Mental Status: She is alert.       EKG:   Recent Labs: 05/09/2023: ALT 12 05/27/2023: BUN 15; Creatinine, Ser 0.67; Hemoglobin 11.4; Platelets 431; Potassium 4.2; Sodium 136    Lipid  Panel    Component Value Date/Time   CHOL 265 (H) 03/22/2023 1115   CHOL 285 (H) 04/23/2015 1144   TRIG 105 03/22/2023 1115   TRIG 140 04/23/2015 1144   HDL 54 03/22/2023 1115   HDL 50 04/23/2015 1144   CHOLHDL 4.9 (H) 03/22/2023 1115   CHOLHDL 2.3 06/17/2020 0450   VLDL 10 06/17/2020 0450   LDLCALC 193 (H) 03/22/2023 1115   LDLCALC 207 (H) 04/23/2015 1144      Other studies Reviewed: Additional studies/ records that were reviewed today include:  Review of the above records demonstrates:      11/23/2016    2:49 PM  PAD Screen  Previous PAD dx? No  Previous surgical procedure? No  Pain with walking? Yes  Subsides with rest? Yes  Feet/toe relief with dangling? Yes  Painful, non-healing ulcers? No  Extremities discolored? No      ASSESSMENT AND PLAN:    ICD-10-CM   1. Preoperative cardiovascular examination  Z01.810    Had cardiac cath done october last year showed 45 % LAD, normal LCX/RCA and LVEF. Has DOE but probably due to lung disease. Proceed with surgery.    2. Atrial fibrillation with RVR (HCC)  I48.91    Now NSR, stop elliquis 3 days before and restart after surgery once no risk of bleeding.    3. Chronic venous insufficiency  I87.2     4. Acute diastolic CHF (congestive heart failure) (HCC)  I50.31     5. Atrial fibrillation with rapid ventricular response (HCC)  I48.91        Problem List Items Addressed This Visit       Cardiovascular and Mediastinum   Chronic venous insufficiency   Atrial fibrillation with RVR (HCC)   Acute diastolic CHF (congestive heart failure) (HCC)   Atrial fibrillation with rapid ventricular response (HCC)   Other Visit Diagnoses       Preoperative cardiovascular  examination    -  Primary   Had cardiac cath done october last year showed 45 % LAD, normal LCX/RCA and LVEF. Has DOE but probably due to lung disease. Proceed with surgery.          Disposition:   Return in about 2 months (around 10/21/2023).    Total  time spent: 30 minutes  Signed,  Denyse Bathe, MD  08/23/2023 1:52 PM    [facility]

## 2023-08-24 ENCOUNTER — Ambulatory Visit
Admission: RE | Admit: 2023-08-24 | Discharge: 2023-08-24 | Disposition: A | Discharge status: Home / Self Care | Payer: Medicare Other | Source: Ambulatory Visit | Attending: Thoracic Surgery (Cardiothoracic Vascular Surgery) | Admitting: Thoracic Surgery (Cardiothoracic Vascular Surgery)

## 2023-08-24 DIAGNOSIS — R911 Solitary pulmonary nodule: Secondary | ICD-10-CM | POA: Insufficient documentation

## 2023-08-24 DIAGNOSIS — H53143 Visual discomfort, bilateral: Secondary | ICD-10-CM | POA: Diagnosis not present

## 2023-08-24 DIAGNOSIS — C3490 Malignant neoplasm of unspecified part of unspecified bronchus or lung: Secondary | ICD-10-CM | POA: Insufficient documentation

## 2023-08-24 DIAGNOSIS — K8020 Calculus of gallbladder without cholecystitis without obstruction: Secondary | ICD-10-CM | POA: Diagnosis not present

## 2023-08-24 DIAGNOSIS — H52223 Regular astigmatism, bilateral: Secondary | ICD-10-CM | POA: Diagnosis not present

## 2023-08-24 DIAGNOSIS — I700 Atherosclerosis of aorta: Secondary | ICD-10-CM | POA: Diagnosis not present

## 2023-08-24 DIAGNOSIS — E119 Type 2 diabetes mellitus without complications: Secondary | ICD-10-CM | POA: Diagnosis not present

## 2023-08-24 DIAGNOSIS — H5203 Hypermetropia, bilateral: Secondary | ICD-10-CM | POA: Diagnosis not present

## 2023-08-24 LAB — GLUCOSE, CAPILLARY: Glucose-Capillary: 101 mg/dL — ABNORMAL HIGH (ref 70–99)

## 2023-08-24 MED ORDER — FLUDEOXYGLUCOSE F - 18 (FDG) INJECTION
9.4600 | Freq: Once | INTRAVENOUS | Status: AC | PRN
  Administered 2023-08-24: 9.46 via INTRAVENOUS

## 2023-08-24 NOTE — Progress Notes (Signed)
 Surgical Instructions    Your procedure is scheduled on August 29, 2023.  Report to [facility] Main Entrance "A" at 6:30 A.M., then check in with the Admitting office.  Call this number if you have problems the morning of surgery:  [phone]  If you have any questions prior to your surgery date call [phone]: Open Monday-Friday 8am-4pm If you experience any cold or flu symptoms such as cough, fever, chills, shortness of breath, etc. between now and your scheduled surgery, please notify us  at the above number.     Remember:  Do not eat or drink after midnight the night before your surgery    Take these medicines the morning of surgery with A SIP OF WATER  DULoxetine  (CYMBALTA )  diltiazem  (CARDIZEM  CD)  fesoterodine  (TOVIAZ )  isosorbide  mononitrate (IMDUR )  sotalol  (BETAPACE )  Vibegron (GEMTESA)  valACYclovir  (VALTREX )   IF NEEDED albuterol  (VENTOLIN  HFA)  inhaler May bring with you EPINEPHrine  injection   As of today, STOP taking any Aspirin  (unless otherwise instructed by your surgeon) Aleve, Naproxen, Ibuprofen, Motrin, Advil, Goody's, BC's, all herbal medications, fish oil, and all vitamins.    apixaban  (ELIQUIS ) last dose 08-26-23 tirzepatide (MOUNJARO) last dose 08-14-22              WHAT DO I DO ABOUT MY DIABETES MEDICATION?   Do not take oral diabetes medicines (pills) the morning of surgery.       metFORMIN  (GLUCOPHAGE ) 08-26-23  The day of surgery, do not take other diabetes injectables, including Byetta (exenatide), Bydureon (exenatide ER), Victoza (liraglutide), or Trulicity (dulaglutide).  If your CBG is greater than 220 mg/dL, you may take  of your sliding scale (correction) dose of insulin .   HOW TO MANAGE YOUR DIABETES BEFORE AND AFTER SURGERY  Why is it important to control my blood sugar before and after surgery? Improving blood sugar levels before and after surgery helps healing and can limit problems. A way of improving blood sugar control is  eating a healthy diet by:  Eating less sugar and carbohydrates  Increasing activity/exercise  Talking with your doctor about reaching your blood sugar goals High blood sugars (greater than 180 mg/dL) can raise your risk of infections and slow your recovery, so you will need to focus on controlling your diabetes during the weeks before surgery. Make sure that the doctor who takes care of your diabetes knows about your planned surgery including the date and location.  How do I manage my blood sugar before surgery? Check your blood sugar at least 4 times a day, starting 2 days before surgery, to make sure that the level is not too high or low.  Check your blood sugar the morning of your surgery when you wake up and every 2 hours until you get to the Short Stay unit.  If your blood sugar is less than 70 mg/dL, you will need to treat for low blood sugar: Do not take insulin . Treat a low blood sugar (less than 70 mg/dL) with  cup of clear juice (cranberry or apple), 4 glucose tablets, OR glucose gel. Recheck blood sugar in 15 minutes after treatment (to make sure it is greater than 70 mg/dL). If your blood sugar is not greater than 70 mg/dL on recheck, call [phone] for further instructions. Report your blood sugar to the short stay nurse when you get to Short Stay.  If you are admitted to the hospital after surgery: Your blood sugar will be checked by the staff and you will probably be  given insulin  after surgery (instead of oral diabetes medicines) to make sure you have good blood sugar levels. The goal for blood sugar control after surgery is 80-180 mg/dL.        Do NOT Smoke (Tobacco/Vaping) for 24 hours prior to your procedure.  If you use a CPAP at night, you may bring your mask/headgear for your overnight stay.   Contacts, glasses, piercing's, hearing aid's, dentures or partials may not be worn into surgery, please bring cases for these belongings.    For patients admitted to the  hospital, discharge time will be determined by your treatment team.   Patients discharged the day of surgery will not be allowed to drive home, and someone needs to stay with them for 24 hours.  SURGICAL WAITING ROOM VISITATION Patients having surgery or a procedure may have no more than 2 support people in the waiting area - these visitors may rotate.   Children under the age of 53 must have an adult with them who is not the patient. If the patient needs to stay at the hospital during part of their recovery, the visitor guidelines for inpatient rooms apply. Pre-op nurse will coordinate an appropriate time for 1 support person to accompany patient in pre-op.  This support person may not rotate.   Please refer to the [facility] website for the visitor guidelines for Inpatients (after your surgery is over and you are in a regular room).    Special instructions:   [facility]- Preparing For Surgery  Before surgery, you can play an important role. Because skin is not sterile, your skin needs to be as free of germs as possible. You can reduce the number of germs on your skin by washing with CHG (chlorahexidine gluconate) Soap before surgery.  CHG is an antiseptic cleaner which kills germs and bonds with the skin to continue killing germs even after washing.    Oral Hygiene is also important to reduce your risk of infection.  Remember - BRUSH YOUR TEETH THE MORNING OF SURGERY WITH YOUR REGULAR TOOTHPASTE  Please do not use if you have an allergy to CHG or antibacterial soaps. If your skin becomes reddened/irritated stop using the CHG.  Do not shave (including legs and underarms) for at least 48 hours prior to first CHG shower. It is OK to shave your face.  Please follow these instructions carefully.   Shower the NIGHT BEFORE SURGERY and the MORNING OF SURGERY  If you chose to wash your hair, wash your hair first as usual with your normal shampoo.  After you shampoo, rinse your hair and body  thoroughly to remove the shampoo.  Use CHG Soap as you would any other liquid soap. You can apply CHG directly to the skin and wash gently with a scrungie or a clean washcloth.   Apply the CHG Soap to your body ONLY FROM THE NECK DOWN.  Do not use on open wounds or open sores. Avoid contact with your eyes, ears, mouth and genitals (private parts). Wash Face and genitals (private parts)  with your normal soap.   Wash thoroughly, paying special attention to the area where your surgery will be performed.  Thoroughly rinse your body with warm water from the neck down.  DO NOT shower/wash with your normal soap after using and rinsing off the CHG Soap.  Pat yourself dry with a CLEAN TOWEL.  Wear CLEAN PAJAMAS to bed the night before surgery  Place CLEAN SHEETS on your bed the night before your  surgery  DO NOT SLEEP WITH PETS.   Day of Surgery: Take a shower with CHG soap. Do not wear jewelry or makeup Do not wear lotions, powders, perfumes/colognes, or deodorant. Do not shave 48 hours prior to surgery.  Men may shave face and neck. Do not bring valuables to the hospital.  [facility] is not responsible for any belongings or valuables. Do not wear nail polish, gel polish, artificial nails, or any other type of covering on natural nails (fingers and toes) If you have artificial nails or gel coating that need to be removed by a nail salon, please have this removed prior to surgery. Artificial nails or gel coating may interfere with anesthesia's ability to adequately monitor your vital signs. Wear Clean/Comfortable clothing the morning of surgery Remember to brush your teeth WITH YOUR REGULAR TOOTHPASTE.   Please read over the following fact sheets that you were given.    If you received a COVID test during your pre-op visit  it is requested that you wear a mask when out in public, stay away from anyone that may not be feeling well and notify your surgeon if you develop symptoms. If you  have been in contact with anyone that has tested positive in the last 10 days please notify you surgeon.

## 2023-08-27 ENCOUNTER — Encounter (HOSPITAL_COMMUNITY)
Admission: RE | Admit: 2023-08-27 | Discharge: 2023-08-27 | Disposition: A | Discharge status: Home / Self Care | Payer: Medicare Other | Source: Ambulatory Visit | Attending: Thoracic Surgery (Cardiothoracic Vascular Surgery)

## 2023-08-27 ENCOUNTER — Ambulatory Visit (HOSPITAL_COMMUNITY)
Admission: RE | Admit: 2023-08-27 | Discharge: 2023-08-27 | Disposition: A | Discharge status: Home / Self Care | Payer: Medicare Other | Source: Ambulatory Visit | Attending: Thoracic Surgery (Cardiothoracic Vascular Surgery) | Admitting: Thoracic Surgery (Cardiothoracic Vascular Surgery)

## 2023-08-27 ENCOUNTER — Other Ambulatory Visit: Payer: Self-pay

## 2023-08-27 ENCOUNTER — Encounter (HOSPITAL_COMMUNITY): Payer: Self-pay

## 2023-08-27 VITALS — BP 98/64 | HR 74 | Temp 97.6°F | Resp 18 | Ht 60.0 in | Wt 169.9 lb

## 2023-08-27 DIAGNOSIS — J432 Centrilobular emphysema: Secondary | ICD-10-CM | POA: Diagnosis present

## 2023-08-27 DIAGNOSIS — I110 Hypertensive heart disease with heart failure: Secondary | ICD-10-CM | POA: Diagnosis not present

## 2023-08-27 DIAGNOSIS — I480 Paroxysmal atrial fibrillation: Secondary | ICD-10-CM | POA: Diagnosis not present

## 2023-08-27 DIAGNOSIS — Z87891 Personal history of nicotine dependence: Secondary | ICD-10-CM | POA: Diagnosis not present

## 2023-08-27 DIAGNOSIS — I4891 Unspecified atrial fibrillation: Secondary | ICD-10-CM | POA: Insufficient documentation

## 2023-08-27 DIAGNOSIS — G4733 Obstructive sleep apnea (adult) (pediatric): Secondary | ICD-10-CM | POA: Diagnosis present

## 2023-08-27 DIAGNOSIS — J984 Other disorders of lung: Secondary | ICD-10-CM | POA: Diagnosis not present

## 2023-08-27 DIAGNOSIS — J939 Pneumothorax, unspecified: Secondary | ICD-10-CM | POA: Diagnosis not present

## 2023-08-27 DIAGNOSIS — I2510 Atherosclerotic heart disease of native coronary artery without angina pectoris: Secondary | ICD-10-CM | POA: Diagnosis not present

## 2023-08-27 DIAGNOSIS — E785 Hyperlipidemia, unspecified: Secondary | ICD-10-CM | POA: Diagnosis present

## 2023-08-27 DIAGNOSIS — I700 Atherosclerosis of aorta: Secondary | ICD-10-CM | POA: Diagnosis not present

## 2023-08-27 DIAGNOSIS — J449 Chronic obstructive pulmonary disease, unspecified: Secondary | ICD-10-CM | POA: Diagnosis not present

## 2023-08-27 DIAGNOSIS — Z7901 Long term (current) use of anticoagulants: Secondary | ICD-10-CM | POA: Insufficient documentation

## 2023-08-27 DIAGNOSIS — C3432 Malignant neoplasm of lower lobe, left bronchus or lung: Secondary | ICD-10-CM | POA: Insufficient documentation

## 2023-08-27 DIAGNOSIS — T797XXA Traumatic subcutaneous emphysema, initial encounter: Secondary | ICD-10-CM | POA: Diagnosis not present

## 2023-08-27 DIAGNOSIS — Z5181 Encounter for therapeutic drug level monitoring: Secondary | ICD-10-CM | POA: Insufficient documentation

## 2023-08-27 DIAGNOSIS — Z452 Encounter for adjustment and management of vascular access device: Secondary | ICD-10-CM | POA: Diagnosis not present

## 2023-08-27 DIAGNOSIS — Z7984 Long term (current) use of oral hypoglycemic drugs: Secondary | ICD-10-CM | POA: Diagnosis not present

## 2023-08-27 DIAGNOSIS — I5030 Unspecified diastolic (congestive) heart failure: Secondary | ICD-10-CM | POA: Insufficient documentation

## 2023-08-27 DIAGNOSIS — Z6841 Body Mass Index (BMI) 40.0 and over, adult: Secondary | ICD-10-CM | POA: Diagnosis not present

## 2023-08-27 DIAGNOSIS — E66811 Obesity, class 1: Secondary | ICD-10-CM | POA: Diagnosis not present

## 2023-08-27 DIAGNOSIS — I6523 Occlusion and stenosis of bilateral carotid arteries: Secondary | ICD-10-CM | POA: Diagnosis not present

## 2023-08-27 DIAGNOSIS — Z4682 Encounter for fitting and adjustment of non-vascular catheter: Secondary | ICD-10-CM | POA: Diagnosis not present

## 2023-08-27 DIAGNOSIS — E119 Type 2 diabetes mellitus without complications: Secondary | ICD-10-CM | POA: Insufficient documentation

## 2023-08-27 DIAGNOSIS — C3490 Malignant neoplasm of unspecified part of unspecified bronchus or lung: Secondary | ICD-10-CM

## 2023-08-27 DIAGNOSIS — J9811 Atelectasis: Secondary | ICD-10-CM | POA: Diagnosis not present

## 2023-08-27 DIAGNOSIS — M797 Fibromyalgia: Secondary | ICD-10-CM | POA: Diagnosis present

## 2023-08-27 DIAGNOSIS — Z9889 Other specified postprocedural states: Secondary | ICD-10-CM | POA: Diagnosis not present

## 2023-08-27 DIAGNOSIS — Z833 Family history of diabetes mellitus: Secondary | ICD-10-CM | POA: Diagnosis not present

## 2023-08-27 DIAGNOSIS — Z01818 Encounter for other preprocedural examination: Secondary | ICD-10-CM | POA: Insufficient documentation

## 2023-08-27 DIAGNOSIS — I482 Chronic atrial fibrillation, unspecified: Secondary | ICD-10-CM | POA: Diagnosis not present

## 2023-08-27 DIAGNOSIS — Z01811 Encounter for preprocedural respiratory examination: Secondary | ICD-10-CM | POA: Diagnosis not present

## 2023-08-27 DIAGNOSIS — Z96652 Presence of left artificial knee joint: Secondary | ICD-10-CM | POA: Insufficient documentation

## 2023-08-27 DIAGNOSIS — I5031 Acute diastolic (congestive) heart failure: Secondary | ICD-10-CM | POA: Diagnosis not present

## 2023-08-27 DIAGNOSIS — Z8249 Family history of ischemic heart disease and other diseases of the circulatory system: Secondary | ICD-10-CM | POA: Diagnosis not present

## 2023-08-27 DIAGNOSIS — Z48813 Encounter for surgical aftercare following surgery on the respiratory system: Secondary | ICD-10-CM | POA: Diagnosis not present

## 2023-08-27 DIAGNOSIS — K861 Other chronic pancreatitis: Secondary | ICD-10-CM | POA: Diagnosis not present

## 2023-08-27 DIAGNOSIS — N3281 Overactive bladder: Secondary | ICD-10-CM | POA: Diagnosis present

## 2023-08-27 LAB — COMPREHENSIVE METABOLIC PANEL
ALT: 15 U/L (ref 0–44)
AST: 17 U/L (ref 15–41)
Albumin: 3.9 g/dL (ref 3.5–5.0)
Alkaline Phosphatase: 55 U/L (ref 38–126)
Anion gap: 14 (ref 5–15)
BUN: 19 mg/dL (ref 8–23)
CO2: 25 mmol/L (ref 22–32)
Calcium: 10.4 mg/dL — ABNORMAL HIGH (ref 8.9–10.3)
Chloride: 99 mmol/L (ref 98–111)
Creatinine, Ser: 0.87 mg/dL (ref 0.44–1.00)
GFR, Estimated: >60 mL/min (ref >60)
Glucose, Bld: 97 mg/dL (ref 70–99)
Potassium: 3.7 mmol/L (ref 3.5–5.1)
Sodium: 138 mmol/L (ref 135–145)
Total Bilirubin: 0.9 mg/dL (ref 0.0–1.2)
Total Protein: 7.5 g/dL (ref 6.5–8.1)

## 2023-08-27 LAB — TYPE AND SCREEN
ABO/RH(D): A NEG
Antibody Screen: NEGATIVE

## 2023-08-27 LAB — URINALYSIS, ROUTINE W REFLEX MICROSCOPIC
Bilirubin Urine: NEGATIVE
Glucose, UA: NEGATIVE mg/dL
Hgb urine dipstick: NEGATIVE
Ketones, ur: NEGATIVE mg/dL
Nitrite: NEGATIVE
Protein, ur: NEGATIVE mg/dL
Specific Gravity, Urine: 1.017 (ref 1.005–1.030)
pH: 5 (ref 5.0–8.0)

## 2023-08-27 LAB — CBC
HCT: 43 % (ref 36.0–46.0)
Hemoglobin: 13.4 g/dL (ref 12.0–15.0)
MCH: 27.6 pg (ref 26.0–34.0)
MCHC: 31.2 g/dL (ref 30.0–36.0)
MCV: 88.5 fL (ref 80.0–100.0)
Platelets: 351 10*3/uL (ref 150–400)
RBC: 4.86 MIL/uL (ref 3.87–5.11)
RDW: 14.4 % (ref 11.5–15.5)
WBC: 9 10*3/uL (ref 4.0–10.5)
nRBC: 0 % (ref 0.0–0.2)

## 2023-08-27 LAB — PROTIME-INR
INR: 1.2 (ref 0.8–1.2)
Prothrombin Time: 15.3 s — ABNORMAL HIGH (ref 11.4–15.2)

## 2023-08-27 LAB — APTT: aPTT: 34 s (ref 24–36)

## 2023-08-27 LAB — GLUCOSE, CAPILLARY: Glucose-Capillary: 95 mg/dL (ref 70–99)

## 2023-08-27 LAB — SURGICAL PCR SCREEN
MRSA, PCR: NEGATIVE
Staphylococcus aureus: NEGATIVE

## 2023-08-27 NOTE — Progress Notes (Signed)
 PCP - Aimee Houseman. Ledell Prudent. MD Cardiologist - Debborah Fairly, MD  PPM/ICD - Denies  Chest x-ray - 08/27/2023 EKG - 08/27/2023 Stress Test - 12/15/2022 ECHO - 12/19/2022 Cardiac Cath - 04/24/2023  Sleep Study - OSA positive CPAP - Settings on 10 and 4  DM: Type II Fasting Blood Sugar : 90-100 Checks Blood Sugar once a week  Last dose of GLP1 agonist-  02,10/2023 GLP1 instructions: Hold Mounjaro 1 week prior to surgery.  Blood Thinner Instructions: Hold Eliquis  2 days prior to procedure. Aspirin  Instructions: N/A  ERAS Protcol - NPO PRE-SURGERY Ensure or G2- N/A  COVID TEST- No   Anesthesia review: Yes, cardiac clearance on 08/23/2023.  Patient denies shortness of breath, fever, cough and chest pain at PAT appointment   All instructions explained to the patient, with a verbal understanding of the material. Patient agrees to go over the instructions while at home for a better understanding.The opportunity to ask questions was provided.

## 2023-08-28 ENCOUNTER — Ambulatory Visit (INDEPENDENT_AMBULATORY_CARE_PROVIDER_SITE_OTHER): Payer: Medicare Other | Admitting: Pulmonary Disease

## 2023-08-28 ENCOUNTER — Encounter: Payer: Self-pay | Admitting: Pulmonary Disease

## 2023-08-28 VITALS — BP 118/62 | HR 80 | Temp 97.1°F | Ht 60.0 in | Wt 168.6 lb

## 2023-08-28 DIAGNOSIS — Z01811 Encounter for preprocedural respiratory examination: Secondary | ICD-10-CM | POA: Diagnosis not present

## 2023-08-28 DIAGNOSIS — J449 Chronic obstructive pulmonary disease, unspecified: Secondary | ICD-10-CM | POA: Diagnosis not present

## 2023-08-28 DIAGNOSIS — C3490 Malignant neoplasm of unspecified part of unspecified bronchus or lung: Secondary | ICD-10-CM

## 2023-08-28 DIAGNOSIS — E66811 Obesity, class 1: Secondary | ICD-10-CM

## 2023-08-28 NOTE — Patient Instructions (Signed)
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 We reviewed your recent PET scan and x-ray results, which showed no concerning activity. We also talked about your COPD management and the positive impact of your significant weight loss on your overall health and treatment options.  YOUR PLAN:  -ADENOCARCINOMA OF THE LUNG: Adenocarcinoma of the lung is a type of cancer that forms in the glandular cells of the lung. You are scheduled for a lung resection surgery (lobectomy) tomorrow to remove the tumor. The PET scan showed no activity, which is consistent with your type of cancer. We will monitor your recovery after the surgery and reassess the need for any additional treatments like chemotherapy or radiation, although it is likely you will not need them.  -CHRONIC OBSTRUCTIVE PULMONARY DISEASE (COPD): COPD is a chronic lung condition that makes it hard to breathe. Your COPD is well-controlled with your current medication, Trelegy, and you have rarely needed to use albuterol. Continue taking Trelegy daily and use albuterol as needed. We will keep monitoring your symptoms and lung function.  -OBESITY: Obesity is a condition where excess body fat affects your health. You have successfully lost over 100 pounds, which has greatly improved your overall health and helped in managing your COPD. This weight loss has also made your upcoming lung surgery more feasible. Continue with your current weight management strategies.  INSTRUCTIONS:  Please follow up with a scheduled appointment in two months. Post-operative care instructions will be provided via MyChart. If you experience any issues during your recovery, do not hesitate to call us.

## 2023-08-28 NOTE — Progress Notes (Addendum)
Subjective:    Patient ID: Tammy [name], female    DOB: 29-Oct-1950, 73 y.o.   MRN: [id number]  Patient Care Team: Cleatis Polka., MD as PCP - General (Internal Medicine) Rollene Rotunda, MD as PCP - Cardiology (Cardiology) Glory Buff, RN as Oncology Nurse Navigator Salena Saner, MD as Consulting Physician (Pulmonary Disease)  Chief Complaint  Patient presents with   Follow-up    DOE. No wheezing. Dry cough. Hoarseness.    BACKGROUND/INTERVAL: Tammy [name] is a 73 year old former smoker (quit 2010, 30 PY) who presents for follow-up on the issue of a left lower lobe nodule noted on LDCT previously.  She was initially evaluated on 20 December 2021.  She was last seen on 19 July 2023 prior to robotic bronchoscopy.  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  A fiducial marker was placed adjacent to the lesion during that procedure.  She has been evaluated by Dr. Dorris Fetch for lobectomy.  She is scheduled for lobectomy tomorrow.  HPI Discussed the use of AI scribe software for clinical note transcription with the patient, who gave verbal consent to proceed.  History of Present Illness   Tammy [name] is a 73 year old female with adenocarcinoma of the lung who presents for pre-operative evaluation prior to lung resection.  She is scheduled for lung resection tomorrow due to adenocarcinoma of the lung. Her recent PET scan did not show any activity, including in the lymph nodes.  We discussed this that her type of cancer typically does not show activity on PET scans, making it challenging to detect. A pre-operative x-ray yesterday also appeared fine, with no concerning findings. The tumor is located on the left lobe and is 'ground glass' in character, indicating a non-solid mass.  She has a history of COPD but reports no recent problems with it. Her last breathing tests were decent, and she is  currently using Trelegy daily. She has not needed to use albuterol, except possibly once since her last visit.  She has experienced significant weight loss, having lost over 100 pounds with the help of Mounjaro. This weight loss has alleviated pressure on her respiratory system and has been beneficial in her current medical situation.      DATA 10/26/2021 chest LDCT: Left lower lobe pulmonary nodule main diameter of 1.5 cm, poorly defined. 11/09/2021 PET/CT: No signs of hypermetabolic activity on the nodule in question cannot exclude indolent bronchogenic neoplasm, 39-month follow-up chest CT. 03/06/2022 chest CT: Persistent vague area of nodularity unchanged from prior, recommend follow-up CT 6 months. 04/20/2022 PFTs: FEV1 1.48 L or 77% predicted, FVC 2.32 L or 91% predicted, FEV1/FVC 64%, lung volumes normal with mild hyperinflation noted.  Bronchodilator response.  Diffusion capacity normal.  Consistent with moderate obstruction. 11/13/2022 chest CT: Persistent subsolid nodule 2.2 x 0.7 cm with 1.5 cm solid component.  Unchanged overall size, possible increased density.  Coronary calcifications noted 12/29/2022 robotic assisted navigational bronchoscopy: "Atypical" cells noted on biopsy specimen, inconclusive. 05/01/2023 PFTs: FEV1 1.34 L or 71% predicted, FVC 2.75 L or 110% predicted, FEV1/FVC 49%, there is mild hyperinflation and air trapping.  Diffusion capacity normal.  Consistent with moderate to severe obstructive lung disease. 07/05/2023 CT chest without contrast: Formal radiology interpretation not available at the time of visit with the patient.  Subsolid left lower lobe nodule has enlarged compared to prior.  Emphysematous changes. 08/24/2023 PET/CT: Part solid left lower lobe pulmonary nodule demonstrates no hypermetabolic  activity similar to prior PET/CT.  No hypermetabolic pulmonary nodules demonstrated.  No evidence of metastatic disease.  Cholelithiasis.  Aortic  atherosclerosis. 08/27/2023 chest x-ray PA and lateral: No evidence of acute cardiopulmonary process.  Will left lower lobe fiducial marker with vague adjacent groundglass density.  Review of Systems A 10 point review of systems was performed and it is as noted above otherwise negative.   Patient Active Problem List   Diagnosis Date Noted   Mediastinal adenopathy 08/01/2023   Status post total left knee replacement 05/18/2023   Unstable angina (HCC) 04/20/2023   Other chest pain 12/08/2022   COPD suggested by initial evaluation (HCC) 04/20/2022   Nodule of lower lobe of left lung 11/11/2021   Fatigue 12/30/2020   Elevated coronary artery calcium score 12/30/2020   Unilateral primary osteoarthritis, left knee 10/06/2020   Respiratory failure, acute (HCC) 06/28/2020   Acute diastolic CHF (congestive heart failure) (HCC) 06/28/2020   Atrial fibrillation with rapid ventricular response (HCC) 06/28/2020   Acquired thrombophilia (HCC)    Depression    Atrial fibrillation with RVR (HCC) 06/16/2020   Diabetes mellitus (HCC)    HTN (hypertension)    Sleep apnea    Obesity, Class III, BMI 40-49.9 (morbid obesity) (HCC)    Chronic venous insufficiency 12/30/2018   Varicose veins of both lower extremities with inflammation 12/30/2018   DJD (degenerative joint disease) 12/30/2018   Snoring 11/27/2018   Daytime sleepiness 11/27/2018   Educated about COVID-19 virus infection 11/27/2018   SOB (shortness of breath) 11/27/2018   Hyperlipidemia 05/20/2015   Knee pain 06/06/2012    Social History   Tobacco Use   Smoking status: Former    Current packs/day: 0.00    Average packs/day: 1 pack/day for 30.0 years (30.0 ttl pk-yrs)    Types: Cigarettes    Start date: 11/02/1978    Quit date: 11/01/2008    Years since quitting: 14.8   Smokeless tobacco: Never  Substance Use Topics   Alcohol use: No    Alcohol/week: 0.0 standard drinks of alcohol    Allergies  Allergen Reactions   Betadine  [Povidone Iodine] Anaphylaxis   Contrast Media [Iodinated Contrast Media] Anaphylaxis   Iodine Anaphylaxis   Metrizamide Anaphylaxis   Povidone-Iodine Anaphylaxis   Shellfish Allergy Anaphylaxis    Current Meds  Medication Sig   albuterol (VENTOLIN HFA) 108 (90 Base) MCG/ACT inhaler Inhale 2 puffs into the lungs every 6 (six) hours as needed.   apixaban (ELIQUIS) 5 MG TABS tablet Take 5 mg by mouth 2 (two) times daily.   diltiazem (CARDIZEM CD) 240 MG 24 hr capsule Take 1 capsule (240 mg total) by mouth daily.   DULoxetine (CYMBALTA) 60 MG capsule Take 60 mg by mouth daily.   EPINEPHrine 0.3 mg/0.3 mL IJ SOAJ injection Inject 0.3 mg into the muscle as needed for anaphylaxis.   Evolocumab (REPATHA SURECLICK) 140 MG/ML SOAJ Inject 140 mg into the skin every 14 (fourteen) days.   fesoterodine (TOVIAZ) 4 MG TB24 tablet Take 4 mg by mouth daily.   Fluticasone-Umeclidin-Vilant (TRELEGY ELLIPTA) 100-62.5-25 MCG/ACT AEPB Inhale 1 Dose into the lungs daily.   furosemide (LASIX) 20 MG tablet Take 20 mg by mouth daily.   isosorbide mononitrate (IMDUR) 30 MG 24 hr tablet Take 30 mg by mouth 2 (two) times daily.   metFORMIN (GLUCOPHAGE) 500 MG tablet Take 500 mg by mouth 2 (two) times daily.   potassium chloride (KLOR-CON) 10 MEQ tablet Take 10 mEq by mouth daily.  sotalol (BETAPACE) 80 MG tablet Take 1 tablet (80 mg total) by mouth every 12 (twelve) hours.   tirzepatide [facility]) 12.5 MG/0.5ML Pen Inject 12.5 mg into the skin every Tuesday.   valACYclovir (VALTREX) 1000 MG tablet Take 1,000 mg by mouth daily.   valsartan (DIOVAN) 320 MG tablet Take 320 mg by mouth daily.   Vibegron (GEMTESA) 75 MG TABS Take 1 tablet by mouth daily.    Immunization History  Administered Date(s) Administered   Influenza-Unspecified 03/21/2021   PFIZER Comirnaty(Gray Top)Covid-19 Tri-Sucrose Vaccine 10/03/2019, 10/31/2019   Pneumococcal Polysaccharide-23 09/12/2018   Tdap 06/14/2022   Zoster, Live 10/10/2016,  02/04/2017        Objective:   BP 118/62 (BP Location: Right Arm, Cuff Size: Normal)   Pulse 80   Temp (!) 97.1 F (36.2 C)   Ht 5' (1.524 m)   Wt 168 lb 9.6 oz (76.5 kg)   SpO2 96%   BMI 32.93 kg/m   SpO2: 96 % O2 Device: None (Room air)  GENERAL: Morbidly obese woman, no acute distress, fully ambulatory.  No conversational dyspnea. HEAD: Normocephalic, atraumatic.  EYES: Pupils equal, round, reactive to light.  No scleral icterus.  MOUTH: No prosthesis, few chipped teeth.  Oral mucosa moist.  No thrush. NECK: Supple. No thyromegaly. Trachea midline. No JVD.  No adenopathy. PULMONARY: Good air entry bilaterally.  No adventitious sounds. CARDIOVASCULAR: S1 and S2. Regular rate and rhythm.  No rubs, murmurs or gallops heard.. ABDOMEN: Obese, otherwise benign. MUSCULOSKELETAL: No joint deformity, no clubbing, trace lower extremity edema.  NEUROLOGIC: Grossly nonfocal, gait slow.  Speech is fluent. SKIN: Intact,warm,dry.  Multiple varicosities lower extremities, mild stasis changes. PSYCH: Mood and behavior normal.    X-ray obtained 27 August 2023 showing no acute pulmonary process, fiducial marker adjacent to the groundglass opacity on the left lower lobe:    Assessment & Plan:     ICD-10-CM   1. Mucinous adenocarcinoma of lung (HCC)  C34.90     2. Stage 2 moderate COPD by GOLD classification (HCC)  J44.9     3. Preoperative respiratory examination  Z01.811     4. Obesity, Class I, BMI 30-34.9  Z61.096      Discussion:    Adenocarcinoma of the Lung Tammy [name] is scheduled for a lung resection tomorrow. The PET scan showed no activity, including in the lymph nodes, consistent with her cancer's nature. The tumor, located on the left lung, appears as ground glass opacity. The plan is to perform a left lower lobectomy. She likely will not need chemotherapy or radiation post-surgery.  However this will be best determined after pathology examination.   - Proceed  with lung resection surgery (left lower lobectomy) as scheduled - Monitor post-operative recovery and reassess need for adjuvant therapy  Chronic Obstructive Pulmonary Disease (COPD) COPD is well-controlled with Trelegy and minimal use of albuterol. Recent pulmonary function tests were satisfactory. - From the pulmonary standpoint she has some mild to moderate risk, she is well compensated at present. - Continue Trelegy daily - Use albuterol as needed - Monitor COPD symptoms and pulmonary function tests  Obesity She has successfully lost over 100 pounds, positively impacting her overall health and COPD management. The weight loss has also facilitated the current surgical approach for her lung cancer. - Continue current weight management strategies  Follow-up - Schedule follow-up appointment in two months - Provide post-operative care instructions via MyChart - Call if any issues arise during recovery.     Advised if  symptoms do not improve or worsen, to please contact office for sooner follow up or seek emergency care.    I spent 32 minutes of dedicated to the care of this patient on the date of this encounter to include pre-visit review of records, face-to-face time with the patient discussing conditions above, post visit ordering of testing, clinical documentation with the electronic health record, making appropriate referrals as documented, and communicating necessary findings to members of the patients care team.     C. Danice Goltz, MD Advanced Bronchoscopy PCCM [facility]    *This note was generated using voice recognition software/Dragon and/or AI transcription program.  Despite best efforts to proofread, errors can occur which can change the meaning. Any transcriptional errors that result from this process are unintentional and may not be fully corrected at the time of dictation.

## 2023-08-28 NOTE — Progress Notes (Signed)
Case: [id number] Date/Time: 08/29/23 0815   Procedure: XI ROBOTIC ASSISTED THORACOSCOPY-LEFT LOWER LOBECTOMY (Left: Chest)   Anesthesia type: General   Pre-op diagnosis: LLL ADENOCARCINOMA   [facility]: [facility] ROOM 10 / [facility]   Surgeons: Loreli Slot, MD       DISCUSSION:  Tammy [name] is a 73 yo female who is scheduled for surgery above. PMH of former smoking (quit 2010), HTN, CAD (mild, non-obstructive by cath in 04/2023), HFpEF, DOE, A.fib on Eliquis, OSA (uses CPAP), COPD, T2DM (A1c 5.7 on 05/09/23) .  She had a L TKA on 05/18/23 without complications. She does have hx of PONV  Patient follows with Pulmonology for COPD and a lung nodule. She had a CT Chest in 06/2023 which showed nodule was enlarging so underwent a bronch on 08/01/23 which was positive for lung cancer. Now scheduled for resection. Prior PFTs which showed moderate COPD. She is a former smoker and uses inhalers. No recent exacerbations. Last seen on 08/28/23. Per Dr. Jayme Cloud: "Proceed with lung resection surgery (left lower lobectomy) as scheduled. Monitor post-operative recovery and reassess need for adjuvant therapy. COPD is well-controlled with Trelegy and minimal use of albuterol. Recent pulmonary function tests were satisfactory. From the pulmonary standpoint she has some mild to moderate risk, she is well compensated at present."  Patient follows with Cardiology for CAD, HFpEF, A. Fib on eliquis, and HLD with statin intolerance. After her TKA she had hypotension and CHF exacerbation in mid-November. She was treated by her Cardiologist and she improved. Last seen by Dr. Welton Flakes on 08/23/23 and cleared: "Had cardiac cath done october last year showed 45 % LAD, normal LCX/RCA and LVEF. Has DOE but probably due to lung disease. Proceed with surgery."  Blood Thinner Instructions: Hold Eliquis 2 days prior to procedure. Last dose of GLP1 agonist-  08/21/2023   VS: BP 98/64   Pulse 74   Temp 36.4 C   Resp 18   Ht 5' (1.524  m)   Wt 77.1 kg   SpO2 98%   BMI 33.18 kg/m   PROVIDERS: Cleatis Polka., MD Cardiology: Adrian Blackwater, MD Pulmonology: Sarina Ser, MD    LABS: Labs reviewed: Acceptable for surgery. (all labs ordered are listed, but only abnormal results are displayed)  Labs Reviewed  COMPREHENSIVE METABOLIC PANEL - Abnormal; Notable for the following components:      Result Value   Calcium 10.4 (*)    All other components within normal limits  PROTIME-INR - Abnormal; Notable for the following components:   Prothrombin Time 15.3 (*)    All other components within normal limits  URINALYSIS, ROUTINE W REFLEX MICROSCOPIC - Abnormal; Notable for the following components:   APPearance HAZY (*)    Leukocytes,Ua MODERATE (*)    Bacteria, UA FEW (*)    All other components within normal limits  SURGICAL PCR SCREEN  GLUCOSE, CAPILLARY  CBC  APTT  TYPE AND SCREEN     IMAGES:  CT Chest 07/30/23:  IMPRESSION: 1. Stable appearing ill-defined and difficult to measure sub solid nodular lesion in the left lower lobe. This measures approximately 20 x 9 mm. 2. No mediastinal or hilar mass or lymphadenopathy. 3. Stable cholelithiasis. 4. Stable right adrenal gland adenoma.   Aortic Atherosclerosis (ICD10-I70.0) and Emphysema (ICD10-J43.9).  EKG 08/27/23:  NSR, rate 78  CV: LHC 04/24/23:     Prox LAD to Mid LAD lesion is 45% stenosed.   The left ventricular systolic function is normal.   LV  end diastolic pressure is normal.   The left ventricular ejection fraction is 55-65% by visual estimate.   There is no aortic valve stenosis.   Cardiac catheterization was done without complication.  Mid LAD had 45 percent lesion.  No significant disease in left circumflex or RCA.  Echocardiogram prior had normal ejection fraction.  Advise medical therapy.   Echo 12/19/22:   ASSESSMENT  Technically adequate study.  Normal chamber sizes.  Normal left ventricular systolic function.  Mild left  ventricular hypertrophy with GRADE 1(relaxation abnormality)  diastolic dysfunction.  Normal right ventricular systolic function.  Normal right ventricular diastolic function.  Normal left ventricular wall motion.  Normal right ventricular wall motion.  Trace tricuspid regurgitation.  Normal pulmonary artery pressure.  Trace mitral regurgitation.  No pericardial effusion.  Moderately dilated Left atrium  No LVH  Past Medical History:  Diagnosis Date   Aortic atherosclerosis (HCC)    Atrial fibrillation (HCC)    a.) CHA2DS2VASc = 5 (age, CHF, HTN, vascular disease history, T2DM);  b.) rate/rhythm maintained on oral diltiazem + sotolol; chronically anticoagulated with apixaban   CAD (coronary artery disease)    a.) cCTA 08/21/2014: Ca2+ = 873 (99th %ile); b.) cCTA 04/10/2023: Ca2+ = 733.7 (93rd %ile; 50-60% mRCA, 50% pLAD, 60% mLCx); c.) LHC 04/24/2023: 45% p-mLAD - med mgmt   CHF (congestive heart failure) (HCC)    a.) TTE 06/17/2020: EF 50-55%, LV dil, mild LVH, mild RVE, mod BAE, G1DD; b.) TTE 12/19/2022: EF >55%, mod LAE, triv TR/PR, G1DD   Cholelithiasis    Chronic calcific pancreatitis (HCC)    Complication of anesthesia    COPD (chronic obstructive pulmonary disease) (HCC)    DDD (degenerative disc disease), lumbar    a.) s/p L3-L5 fusion   DJD (degenerative joint disease)    Encephalitis    Fibromyalgia    Hepatic steatosis    Hiatal hernia    History of kidney stones    HTN (hypertension)    Hyperlipidemia    Lung nodule 10/26/2021   a.) LDCT 10/26/21: 10.5 mm macrolob/slightly spiculated LLL; b.) PET CT 11/09/21: not FCG avid (? 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; f.) CT chest 07/05/23: 1.4x3.0 LLL nod with 1.7cm solid comp   OAB (overactive bladder)    a.) on vibegron +  fesoterodine   On apixaban therapy    OSA on CPAP    PONV (postoperative nausea and vomiting)    T2DM (type 2 diabetes mellitus) (HCC)     Past Surgical History:  Procedure Laterality Date   APPENDECTOMY     BACK SURGERY  2022   BREAST CYST EXCISION     BREAST EXCISIONAL BIOPSY Left 2004   COLONOSCOPY WITH PROPOFOL N/A 11/26/2020   Procedure: COLONOSCOPY WITH PROPOFOL;  Surgeon: Jeani Hawking, MD;  Location: [facility];  Service: Endoscopy;  Laterality: N/A;   COLONOSCOPY WITH PROPOFOL N/A 02/16/2023   Procedure: COLONOSCOPY WITH PROPOFOL;  Surgeon: Jeani Hawking, MD;  Location: [facility];  Service: Gastroenterology;  Laterality: N/A;   DIAGNOSTIC LAPAROSCOPY     HEMOSTASIS CLIP PLACEMENT  11/26/2020   Procedure: HEMOSTASIS CLIP PLACEMENT;  Surgeon: Jeani Hawking, MD;  Location: [facility];  Service: Endoscopy;;   KNEE ARTHROSCOPY     LEFT HEART CATH AND CORONARY ANGIOGRAPHY N/A 04/24/2023   Procedure: LEFT  HEART CATH AND CORONARY ANGIOGRAPHY;  Surgeon: Laurier Nancy, MD;  Location: [facility];  Service: Cardiovascular;  Laterality: N/A;   POLYPECTOMY  11/26/2020   Procedure: POLYPECTOMY;  Surgeon: Jeani Hawking, MD;  Location: [facility];  Service: Endoscopy;;   POLYPECTOMY  02/16/2023   Procedure: POLYPECTOMY;  Surgeon: Jeani Hawking, MD;  [facility]: [facility];  Service: Gastroenterology;;   SUBMUCOSAL TATTOO INJECTION  11/26/2020   Procedure: SUBMUCOSAL TATTOO INJECTION;  Surgeon: Jeani Hawking, MD;  Location: [facility];  Service: Endoscopy;;   TEE WITHOUT CARDIOVERSION N/A 06/29/2020   Procedure: TRANSESOPHAGEAL ECHOCARDIOGRAM (TEE) with DCCV;  Surgeon: Laurier Nancy, MD;  Location: [facility] ORS;  Service: Cardiovascular;  Laterality: N/A;   TOTAL KNEE ARTHROPLASTY Left 05/18/2023   Procedure: LEFT TOTAL KNEE ARTHROPLASTY;  Surgeon: Kathryne Hitch, MD;  Location: [facility];  Service: Orthopedics;  Laterality: Left;    MEDICATIONS:  albuterol  (VENTOLIN HFA) 108 (90 Base) MCG/ACT inhaler   apixaban (ELIQUIS) 5 MG TABS tablet   diltiazem (CARDIZEM CD) 240 MG 24 hr capsule   DULoxetine (CYMBALTA) 60 MG capsule   EPINEPHrine 0.3 mg/0.3 mL IJ SOAJ injection   Evolocumab (REPATHA SURECLICK) 140 MG/ML SOAJ   fesoterodine (TOVIAZ) 4 MG TB24 tablet   Fluticasone-Umeclidin-Vilant (TRELEGY ELLIPTA) 100-62.5-25 MCG/ACT AEPB   furosemide (LASIX) 20 MG tablet   isosorbide mononitrate (IMDUR) 30 MG 24 hr tablet   metFORMIN (GLUCOPHAGE) 500 MG tablet   potassium chloride (KLOR-CON) 10 MEQ tablet   sotalol (BETAPACE) 80 MG tablet   tirzepatide (MOUNJARO) 12.5 MG/0.5ML Pen   valACYclovir (VALTREX) 1000 MG tablet   valsartan (DIOVAN) 320 MG tablet   Vibegron (GEMTESA) 75 MG TABS   No current facility-administered medications for this encounter.    sodium chloride flush (NS) 0.9 % injection 3 mL   Marcille Blanco [facility] Short Stay/Anesthesiology [facility] Phone [phone] 08/28/2023 8:36 PM

## 2023-08-28 NOTE — Anesthesia Preprocedure Evaluation (Signed)
Anesthesia Evaluation  Patient identified by MRN, date of birth, ID band Patient awake    Reviewed: Allergy & Precautions, NPO status , Patient's Chart, lab work & pertinent test results  History of Anesthesia Complications (+) PONV and history of anesthetic complications  Airway Mallampati: II  TM Distance: >3 FB Neck ROM: Full    Dental  (+) Dental Advisory Given   Pulmonary sleep apnea , COPD, former smoker   breath sounds clear to auscultation       Cardiovascular hypertension, Pt. on medications and Pt. on home beta blockers + angina  + CAD and +CHF  + dysrhythmias  Rhythm:Regular Rate:Normal     Neuro/Psych negative neurological ROS     GI/Hepatic Neg liver ROS, hiatal hernia,,,  Endo/Other  diabetes, Type 2    Renal/GU negative Renal ROS     Musculoskeletal  (+) Arthritis ,  Fibromyalgia -  Abdominal   Peds  Hematology negative hematology ROS (+)   Anesthesia Other Findings   Reproductive/Obstetrics                             Anesthesia Physical Anesthesia Plan  ASA: 3  Anesthesia Plan: General   Post-op Pain Management: Tylenol PO (pre-op)*   Induction: Intravenous  PONV Risk Score and Plan: 4 or greater and Midazolam, Dexamethasone, Ondansetron and Treatment may vary due to age or medical condition  Airway Management Planned: Double Lumen EBT  Additional Equipment: Arterial line, CVP and Ultrasound Guidance Line Placement  Intra-op Plan:   Post-operative Plan: Extubation in OR and Possible Post-op intubation/ventilation  Informed Consent: I have reviewed the patients History and Physical, chart, labs and discussed the procedure including the risks, benefits and alternatives for the proposed anesthesia with the patient or authorized representative who has indicated his/her understanding and acceptance.     Dental advisory given  Plan Discussed with:  CRNA  Anesthesia Plan Comments: ( )        Anesthesia Quick Evaluation

## 2023-08-29 ENCOUNTER — Inpatient Hospital Stay (HOSPITAL_COMMUNITY)
Admission: RE | Admit: 2023-08-29 | Discharge: 2023-08-31 | DRG: 163 | Disposition: A | Discharge status: Home / Self Care | Payer: Medicare Other | Attending: Thoracic Surgery (Cardiothoracic Vascular Surgery) | Admitting: Thoracic Surgery (Cardiothoracic Vascular Surgery)

## 2023-08-29 ENCOUNTER — Inpatient Hospital Stay (HOSPITAL_COMMUNITY): Payer: Medicare Other | Admitting: Anesthesiology

## 2023-08-29 ENCOUNTER — Other Ambulatory Visit: Payer: Self-pay

## 2023-08-29 ENCOUNTER — Encounter (HOSPITAL_COMMUNITY)
Admission: RE | Disposition: A | Discharge status: Home / Self Care | Payer: Self-pay | Source: Home / Self Care | Attending: Thoracic Surgery (Cardiothoracic Vascular Surgery)

## 2023-08-29 ENCOUNTER — Encounter (HOSPITAL_COMMUNITY): Payer: Self-pay | Admitting: Thoracic Surgery (Cardiothoracic Vascular Surgery)

## 2023-08-29 ENCOUNTER — Inpatient Hospital Stay (HOSPITAL_COMMUNITY): Payer: Medicare Other

## 2023-08-29 ENCOUNTER — Inpatient Hospital Stay (HOSPITAL_COMMUNITY): Payer: Self-pay | Admitting: Medical

## 2023-08-29 DIAGNOSIS — I110 Hypertensive heart disease with heart failure: Secondary | ICD-10-CM | POA: Diagnosis not present

## 2023-08-29 DIAGNOSIS — I6523 Occlusion and stenosis of bilateral carotid arteries: Secondary | ICD-10-CM | POA: Diagnosis present

## 2023-08-29 DIAGNOSIS — I480 Paroxysmal atrial fibrillation: Secondary | ICD-10-CM | POA: Diagnosis not present

## 2023-08-29 DIAGNOSIS — T797XXA Traumatic subcutaneous emphysema, initial encounter: Secondary | ICD-10-CM | POA: Diagnosis not present

## 2023-08-29 DIAGNOSIS — Z91041 Radiographic dye allergy status: Secondary | ICD-10-CM

## 2023-08-29 DIAGNOSIS — Z7984 Long term (current) use of oral hypoglycemic drugs: Secondary | ICD-10-CM

## 2023-08-29 DIAGNOSIS — K861 Other chronic pancreatitis: Secondary | ICD-10-CM | POA: Diagnosis present

## 2023-08-29 DIAGNOSIS — E119 Type 2 diabetes mellitus without complications: Secondary | ICD-10-CM | POA: Diagnosis present

## 2023-08-29 DIAGNOSIS — C3432 Malignant neoplasm of lower lobe, left bronchus or lung: Secondary | ICD-10-CM | POA: Diagnosis not present

## 2023-08-29 DIAGNOSIS — I5031 Acute diastolic (congestive) heart failure: Secondary | ICD-10-CM | POA: Diagnosis present

## 2023-08-29 DIAGNOSIS — I2510 Atherosclerotic heart disease of native coronary artery without angina pectoris: Secondary | ICD-10-CM

## 2023-08-29 DIAGNOSIS — E66813 Obesity, class 3: Secondary | ICD-10-CM | POA: Diagnosis present

## 2023-08-29 DIAGNOSIS — Z833 Family history of diabetes mellitus: Secondary | ICD-10-CM

## 2023-08-29 DIAGNOSIS — Z96652 Presence of left artificial knee joint: Secondary | ICD-10-CM | POA: Diagnosis present

## 2023-08-29 DIAGNOSIS — Z452 Encounter for adjustment and management of vascular access device: Secondary | ICD-10-CM | POA: Diagnosis not present

## 2023-08-29 DIAGNOSIS — Z7901 Long term (current) use of anticoagulants: Secondary | ICD-10-CM | POA: Diagnosis not present

## 2023-08-29 DIAGNOSIS — Z87891 Personal history of nicotine dependence: Secondary | ICD-10-CM | POA: Diagnosis not present

## 2023-08-29 DIAGNOSIS — C3490 Malignant neoplasm of unspecified part of unspecified bronchus or lung: Secondary | ICD-10-CM

## 2023-08-29 DIAGNOSIS — Z902 Acquired absence of lung [part of]: Principal | ICD-10-CM

## 2023-08-29 DIAGNOSIS — J432 Centrilobular emphysema: Secondary | ICD-10-CM | POA: Diagnosis present

## 2023-08-29 DIAGNOSIS — Z9889 Other specified postprocedural states: Secondary | ICD-10-CM | POA: Diagnosis not present

## 2023-08-29 DIAGNOSIS — Z883 Allergy status to other anti-infective agents status: Secondary | ICD-10-CM

## 2023-08-29 DIAGNOSIS — M797 Fibromyalgia: Secondary | ICD-10-CM | POA: Diagnosis present

## 2023-08-29 DIAGNOSIS — K8020 Calculus of gallbladder without cholecystitis without obstruction: Secondary | ICD-10-CM | POA: Diagnosis present

## 2023-08-29 DIAGNOSIS — Z8249 Family history of ischemic heart disease and other diseases of the circulatory system: Secondary | ICD-10-CM

## 2023-08-29 DIAGNOSIS — Z6841 Body Mass Index (BMI) 40.0 and over, adult: Secondary | ICD-10-CM | POA: Diagnosis not present

## 2023-08-29 DIAGNOSIS — J449 Chronic obstructive pulmonary disease, unspecified: Secondary | ICD-10-CM

## 2023-08-29 DIAGNOSIS — Z888 Allergy status to other drugs, medicaments and biological substances status: Secondary | ICD-10-CM

## 2023-08-29 DIAGNOSIS — J939 Pneumothorax, unspecified: Secondary | ICD-10-CM | POA: Diagnosis not present

## 2023-08-29 DIAGNOSIS — Z880 Allergy status to penicillin: Secondary | ICD-10-CM

## 2023-08-29 DIAGNOSIS — Z48813 Encounter for surgical aftercare following surgery on the respiratory system: Secondary | ICD-10-CM | POA: Diagnosis not present

## 2023-08-29 DIAGNOSIS — Z91013 Allergy to seafood: Secondary | ICD-10-CM

## 2023-08-29 DIAGNOSIS — I700 Atherosclerosis of aorta: Secondary | ICD-10-CM | POA: Diagnosis present

## 2023-08-29 DIAGNOSIS — Z4682 Encounter for fitting and adjustment of non-vascular catheter: Secondary | ICD-10-CM | POA: Diagnosis not present

## 2023-08-29 DIAGNOSIS — N3281 Overactive bladder: Secondary | ICD-10-CM | POA: Diagnosis present

## 2023-08-29 DIAGNOSIS — G4733 Obstructive sleep apnea (adult) (pediatric): Secondary | ICD-10-CM | POA: Diagnosis present

## 2023-08-29 DIAGNOSIS — E785 Hyperlipidemia, unspecified: Secondary | ICD-10-CM | POA: Diagnosis present

## 2023-08-29 DIAGNOSIS — I482 Chronic atrial fibrillation, unspecified: Secondary | ICD-10-CM | POA: Diagnosis not present

## 2023-08-29 DIAGNOSIS — J9811 Atelectasis: Secondary | ICD-10-CM | POA: Diagnosis not present

## 2023-08-29 DIAGNOSIS — Z79899 Other long term (current) drug therapy: Secondary | ICD-10-CM

## 2023-08-29 DIAGNOSIS — K449 Diaphragmatic hernia without obstruction or gangrene: Secondary | ICD-10-CM | POA: Diagnosis present

## 2023-08-29 HISTORY — PX: INTERCOSTAL NERVE BLOCK: SHX5021

## 2023-08-29 HISTORY — PX: LYMPH NODE DISSECTION: SHX5087

## 2023-08-29 LAB — GLUCOSE, CAPILLARY
Glucose-Capillary: 89 mg/dL (ref 70–99)
Glucose-Capillary: 95 mg/dL (ref 70–99)
Glucose-Capillary: 98 mg/dL (ref 70–99)
Glucose-Capillary: 140 mg/dL — ABNORMAL HIGH (ref 70–99)
Glucose-Capillary: 163 mg/dL — ABNORMAL HIGH (ref 70–99)
Glucose-Capillary: 144 mg/dL — ABNORMAL HIGH (ref 70–99)

## 2023-08-29 LAB — ABO/RH: ABO/RH(D): A NEG

## 2023-08-29 SURGERY — LOBECTOMY, LUNG, ROBOT-ASSISTED, USING VATS
Anesthesia: General | Site: Chest | Laterality: Left

## 2023-08-29 MED ORDER — DEXAMETHASONE SODIUM PHOSPHATE 10 MG/ML IJ SOLN
INTRAMUSCULAR | Status: AC
  Filled 2023-08-29: qty 1

## 2023-08-29 MED ORDER — ACETAMINOPHEN 500 MG PO TABS
1000.0000 mg | ORAL_TABLET | Freq: Once | ORAL | Status: AC
  Administered 2023-08-29: 1000 mg via ORAL
  Filled 2023-08-29: qty 2

## 2023-08-29 MED ORDER — ISOSORBIDE MONONITRATE ER 30 MG PO TB24
30.0000 mg | ORAL_TABLET | Freq: Two times a day (BID) | ORAL | Status: DC
  Administered 2023-08-30 – 2023-08-31 (×3): 30 mg via ORAL
  Filled 2023-08-29 (×3): qty 1

## 2023-08-29 MED ORDER — SUGAMMADEX SODIUM 200 MG/2ML IV SOLN
INTRAVENOUS | Status: DC | PRN
  Administered 2023-08-29 (×2): 50 mg via INTRAVENOUS
  Administered 2023-08-29: 152.4 mg via INTRAVENOUS

## 2023-08-29 MED ORDER — DILTIAZEM HCL ER COATED BEADS 240 MG PO CP24
240.0000 mg | ORAL_CAPSULE | Freq: Every day | ORAL | Status: DC
Start: 2023-08-30 — End: 2023-08-31
  Administered 2023-08-30 – 2023-08-31 (×2): 240 mg via ORAL
  Filled 2023-08-29: qty 1
  Filled 2023-08-29: qty 2
  Filled 2023-08-29: qty 1
  Filled 2023-08-29: qty 2

## 2023-08-29 MED ORDER — SODIUM CHLORIDE 0.45 % IV SOLN: INTRAVENOUS | Status: DC

## 2023-08-29 MED ORDER — MIRABEGRON ER 25 MG PO TB24
25.0000 mg | ORAL_TABLET | Freq: Every day | ORAL | Status: DC
  Administered 2023-08-30 – 2023-08-31 (×2): 25 mg via ORAL
  Filled 2023-08-29 (×2): qty 1

## 2023-08-29 MED ORDER — SODIUM CHLORIDE 0.9% IV SOLUTION
INTRAVENOUS | Status: AC | PRN
Start: 2023-08-29 — End: 2023-08-29
  Administered 2023-08-29: 1000 mL via INTRAMUSCULAR

## 2023-08-29 MED ORDER — PHENYLEPHRINE 80 MCG/ML (10ML) SYRINGE FOR IV PUSH (FOR BLOOD PRESSURE SUPPORT)
PREFILLED_SYRINGE | INTRAVENOUS | Status: AC
  Filled 2023-08-29: qty 20

## 2023-08-29 MED ORDER — MIDAZOLAM HCL 2 MG/2ML IJ SOLN
INTRAMUSCULAR | Status: DC | PRN
  Administered 2023-08-29: 2 mg via INTRAVENOUS

## 2023-08-29 MED ORDER — ACETAMINOPHEN 500 MG PO TABS
1000.0000 mg | ORAL_TABLET | Freq: Four times a day (QID) | ORAL | Status: DC
  Administered 2023-08-29 – 2023-08-31 (×7): 1000 mg via ORAL
  Filled 2023-08-29 (×7): qty 2

## 2023-08-29 MED ORDER — TRAMADOL HCL 50 MG PO TABS: 50.0000 mg | ORAL_TABLET | Freq: Four times a day (QID) | ORAL | Status: DC | PRN

## 2023-08-29 MED ORDER — ONDANSETRON HCL 4 MG/2ML IJ SOLN
INTRAMUSCULAR | Status: AC
  Filled 2023-08-29: qty 2

## 2023-08-29 MED ORDER — SOTALOL HCL 80 MG PO TABS
80.0000 mg | ORAL_TABLET | Freq: Two times a day (BID) | ORAL | Status: DC
Start: 2023-08-29 — End: 2023-08-31
  Administered 2023-08-29 – 2023-08-31 (×4): 80 mg via ORAL
  Filled 2023-08-29 (×4): qty 1

## 2023-08-29 MED ORDER — FUROSEMIDE 20 MG PO TABS
20.0000 mg | ORAL_TABLET | Freq: Every day | ORAL | Status: DC
  Administered 2023-08-30 – 2023-08-31 (×2): 20 mg via ORAL
  Filled 2023-08-29 (×2): qty 1

## 2023-08-29 MED ORDER — AMISULPRIDE (ANTIEMETIC) 5 MG/2ML IV SOLN: 10.0000 mg | Freq: Once | INTRAVENOUS | Status: DC | PRN

## 2023-08-29 MED ORDER — ONDANSETRON HCL 4 MG/2ML IJ SOLN
INTRAMUSCULAR | Status: DC | PRN
  Administered 2023-08-29: 4 mg via INTRAVENOUS

## 2023-08-29 MED ORDER — CHLORHEXIDINE GLUCONATE 0.12 % MT SOLN
15.0000 mL | Freq: Once | OROMUCOSAL | Status: AC
  Administered 2023-08-29: 15 mL via OROMUCOSAL
  Filled 2023-08-29: qty 15

## 2023-08-29 MED ORDER — UMECLIDINIUM BROMIDE 62.5 MCG/ACT IN AEPB
1.0000 | INHALATION_SPRAY | Freq: Every day | RESPIRATORY_TRACT | Status: DC
  Administered 2023-08-30 – 2023-08-31 (×2): 1 via RESPIRATORY_TRACT
  Filled 2023-08-29: qty 7

## 2023-08-29 MED ORDER — DULOXETINE HCL 60 MG PO CPEP
60.0000 mg | ORAL_CAPSULE | Freq: Every day | ORAL | Status: DC
  Administered 2023-08-30 – 2023-08-31 (×2): 60 mg via ORAL
  Filled 2023-08-29 (×2): qty 1

## 2023-08-29 MED ORDER — ROCURONIUM BROMIDE 10 MG/ML (PF) SYRINGE
PREFILLED_SYRINGE | INTRAVENOUS | Status: AC
  Filled 2023-08-29: qty 10

## 2023-08-29 MED ORDER — PHENYLEPHRINE 80 MCG/ML (10ML) SYRINGE FOR IV PUSH (FOR BLOOD PRESSURE SUPPORT)
PREFILLED_SYRINGE | INTRAVENOUS | Status: DC | PRN
  Administered 2023-08-29: 80 ug via INTRAVENOUS
  Administered 2023-08-29: 60 ug via INTRAVENOUS
  Administered 2023-08-29: 80 ug via INTRAVENOUS

## 2023-08-29 MED ORDER — ONDANSETRON HCL 4 MG/2ML IJ SOLN: 4.0000 mg | Freq: Four times a day (QID) | INTRAMUSCULAR | Status: DC | PRN

## 2023-08-29 MED ORDER — INSULIN ASPART 100 UNIT/ML IJ SOLN: 0.0000 [IU] | INTRAMUSCULAR | Status: DC | PRN

## 2023-08-29 MED ORDER — PANTOPRAZOLE SODIUM 40 MG PO TBEC
40.0000 mg | DELAYED_RELEASE_TABLET | Freq: Every day | ORAL | Status: DC
  Administered 2023-08-30 – 2023-08-31 (×2): 40 mg via ORAL
  Filled 2023-08-29 (×3): qty 1

## 2023-08-29 MED ORDER — KETOROLAC TROMETHAMINE 15 MG/ML IJ SOLN
15.0000 mg | Freq: Four times a day (QID) | INTRAMUSCULAR | Status: DC | PRN
  Administered 2023-08-29 – 2023-08-30 (×2): 15 mg via INTRAVENOUS
  Filled 2023-08-29 (×2): qty 1

## 2023-08-29 MED ORDER — LIDOCAINE 2% (20 MG/ML) 5 ML SYRINGE
INTRAMUSCULAR | Status: DC | PRN
  Administered 2023-08-29: 60 mg via INTRAVENOUS

## 2023-08-29 MED ORDER — PHENYLEPHRINE HCL-NACL 20-0.9 MG/250ML-% IV SOLN
INTRAVENOUS | Status: DC | PRN
  Administered 2023-08-29: 40 ug/min via INTRAVENOUS

## 2023-08-29 MED ORDER — VALACYCLOVIR HCL 500 MG PO TABS
1000.0000 mg | ORAL_TABLET | Freq: Every day | ORAL | Status: DC
  Administered 2023-08-30 – 2023-08-31 (×2): 1000 mg via ORAL
  Filled 2023-08-29 (×2): qty 2

## 2023-08-29 MED ORDER — FLUTICASONE FUROATE-VILANTEROL 100-25 MCG/ACT IN AEPB
1.0000 | INHALATION_SPRAY | Freq: Every day | RESPIRATORY_TRACT | Status: DC
  Administered 2023-08-30 – 2023-08-31 (×2): 1 via RESPIRATORY_TRACT
  Filled 2023-08-29: qty 28

## 2023-08-29 MED ORDER — BUPIVACAINE HCL (PF) 0.5 % IJ SOLN
INTRAMUSCULAR | Status: AC
  Filled 2023-08-29: qty 30

## 2023-08-29 MED ORDER — FENTANYL CITRATE (PF) 250 MCG/5ML IJ SOLN
INTRAMUSCULAR | Status: DC | PRN
  Administered 2023-08-29 (×3): 50 ug via INTRAVENOUS
  Administered 2023-08-29: 100 ug via INTRAVENOUS

## 2023-08-29 MED ORDER — ACETAMINOPHEN 160 MG/5ML PO SOLN: 1000.0000 mg | Freq: Four times a day (QID) | ORAL | Status: DC

## 2023-08-29 MED ORDER — PROPOFOL 10 MG/ML IV BOLUS
INTRAVENOUS | Status: DC | PRN
Start: 2023-08-29 — End: 2023-08-29
  Administered 2023-08-29: 30 mg via INTRAVENOUS
  Administered 2023-08-29: 140 mg via INTRAVENOUS

## 2023-08-29 MED ORDER — HYDROMORPHONE HCL 1 MG/ML IJ SOLN: 0.2500 mg | INTRAMUSCULAR | Status: DC | PRN

## 2023-08-29 MED ORDER — BUPIVACAINE LIPOSOME 1.3 % IJ SUSP
INTRAMUSCULAR | Status: AC
  Filled 2023-08-29: qty 20

## 2023-08-29 MED ORDER — GABAPENTIN 300 MG PO CAPS: 300.0000 mg | ORAL_CAPSULE | Freq: Two times a day (BID) | ORAL | Status: DC

## 2023-08-29 MED ORDER — SODIUM CHLORIDE 0.9% FLUSH: 10.0000 mL | INTRAVENOUS | Status: DC | PRN

## 2023-08-29 MED ORDER — LUNG SURGERY BOOK
Freq: Once | Status: AC
  Filled 2023-08-29: qty 1

## 2023-08-29 MED ORDER — BUPIVACAINE LIPOSOME 1.3 % IJ SUSP
INTRAMUSCULAR | Status: DC | PRN
  Administered 2023-08-29: 100 mL

## 2023-08-29 MED ORDER — HEMOSTATIC AGENTS (NO CHARGE) OPTIME
TOPICAL | Status: DC | PRN
Start: 2023-08-29 — End: 2023-08-29
  Administered 2023-08-29: 2 via TOPICAL

## 2023-08-29 MED ORDER — ENOXAPARIN SODIUM 40 MG/0.4ML IJ SOSY
40.0000 mg | PREFILLED_SYRINGE | INTRAMUSCULAR | Status: DC
  Administered 2023-08-30 – 2023-08-31 (×2): 40 mg via SUBCUTANEOUS
  Filled 2023-08-29 (×3): qty 0.4

## 2023-08-29 MED ORDER — SENNOSIDES-DOCUSATE SODIUM 8.6-50 MG PO TABS
1.0000 | ORAL_TABLET | Freq: Every day | ORAL | Status: DC
Start: 2023-08-29 — End: 2023-08-31
  Filled 2023-08-29: qty 1

## 2023-08-29 MED ORDER — LIDOCAINE 2% (20 MG/ML) 5 ML SYRINGE
INTRAMUSCULAR | Status: AC
  Filled 2023-08-29: qty 5

## 2023-08-29 MED ORDER — FESOTERODINE FUMARATE ER 4 MG PO TB24
4.0000 mg | ORAL_TABLET | Freq: Every day | ORAL | Status: DC
  Administered 2023-08-30 – 2023-08-31 (×2): 4 mg via ORAL
  Filled 2023-08-29 (×2): qty 1

## 2023-08-29 MED ORDER — MORPHINE SULFATE (PF) 2 MG/ML IV SOLN: 2.0000 mg | INTRAVENOUS | Status: DC | PRN

## 2023-08-29 MED ORDER — OXYCODONE HCL 5 MG PO TABS: 5.0000 mg | ORAL_TABLET | ORAL | Status: DC | PRN

## 2023-08-29 MED ORDER — CHLORHEXIDINE GLUCONATE CLOTH 2 % EX PADS
6.0000 | MEDICATED_PAD | Freq: Every day | CUTANEOUS | Status: DC
  Administered 2023-08-29 – 2023-08-30 (×2): 6 via TOPICAL

## 2023-08-29 MED ORDER — ORAL CARE MOUTH RINSE: 15.0000 mL | Freq: Once | OROMUCOSAL | Status: AC

## 2023-08-29 MED ORDER — DEXAMETHASONE SODIUM PHOSPHATE 10 MG/ML IJ SOLN
INTRAMUSCULAR | Status: DC | PRN
  Administered 2023-08-29: 10 mg via INTRAVENOUS

## 2023-08-29 MED ORDER — ROCURONIUM BROMIDE 10 MG/ML (PF) SYRINGE
PREFILLED_SYRINGE | INTRAVENOUS | Status: DC | PRN
  Administered 2023-08-29: 50 mg via INTRAVENOUS
  Administered 2023-08-29: 20 mg via INTRAVENOUS

## 2023-08-29 MED ORDER — POTASSIUM CHLORIDE CRYS ER 10 MEQ PO TBCR
10.0000 meq | EXTENDED_RELEASE_TABLET | Freq: Every day | ORAL | Status: DC
  Administered 2023-08-30 – 2023-08-31 (×2): 10 meq via ORAL
  Filled 2023-08-29 (×2): qty 1

## 2023-08-29 MED ORDER — CEFAZOLIN SODIUM-DEXTROSE 2-4 GM/100ML-% IV SOLN
2.0000 g | Freq: Three times a day (TID) | INTRAVENOUS | Status: AC
  Administered 2023-08-29 (×2): 2 g via INTRAVENOUS
  Filled 2023-08-29 (×2): qty 100

## 2023-08-29 MED ORDER — FENTANYL CITRATE (PF) 250 MCG/5ML IJ SOLN
INTRAMUSCULAR | Status: AC
  Filled 2023-08-29: qty 5

## 2023-08-29 MED ORDER — SODIUM CHLORIDE 0.9% FLUSH
10.0000 mL | Freq: Two times a day (BID) | INTRAVENOUS | Status: DC
  Administered 2023-08-30: 10 mL

## 2023-08-29 MED ORDER — IRBESARTAN 300 MG PO TABS
300.0000 mg | ORAL_TABLET | Freq: Every day | ORAL | Status: DC
  Administered 2023-08-30 – 2023-08-31 (×2): 300 mg via ORAL
  Filled 2023-08-29 (×2): qty 1
  Filled 2023-08-29 (×2): qty 2

## 2023-08-29 MED ORDER — ALBUMIN HUMAN 5 % IV SOLN: INTRAVENOUS | Status: DC | PRN

## 2023-08-29 MED ORDER — ALBUTEROL SULFATE HFA 108 (90 BASE) MCG/ACT IN AERS: 2.0000 | INHALATION_SPRAY | Freq: Four times a day (QID) | RESPIRATORY_TRACT | Status: DC | PRN

## 2023-08-29 MED ORDER — MIDAZOLAM HCL 2 MG/2ML IJ SOLN
INTRAMUSCULAR | Status: AC
  Filled 2023-08-29: qty 2

## 2023-08-29 MED ORDER — BISACODYL 5 MG PO TBEC
10.0000 mg | DELAYED_RELEASE_TABLET | Freq: Every day | ORAL | Status: DC
  Administered 2023-08-30 – 2023-08-31 (×2): 10 mg via ORAL
  Filled 2023-08-29 (×2): qty 2

## 2023-08-29 MED ORDER — GABAPENTIN 300 MG PO CAPS
300.0000 mg | ORAL_CAPSULE | Freq: Every day | ORAL | Status: DC
  Administered 2023-08-29 – 2023-08-30 (×2): 300 mg via ORAL
  Filled 2023-08-29 (×2): qty 1

## 2023-08-29 MED ORDER — INSULIN ASPART 100 UNIT/ML IJ SOLN
0.0000 [IU] | INTRAMUSCULAR | Status: DC
  Administered 2023-08-29: 3 [IU] via SUBCUTANEOUS
  Administered 2023-08-29: 2 [IU] via SUBCUTANEOUS

## 2023-08-29 MED ORDER — CEFAZOLIN SODIUM-DEXTROSE 2-4 GM/100ML-% IV SOLN
2.0000 g | INTRAVENOUS | Status: AC
Start: 2023-08-29 — End: 2023-08-29
  Administered 2023-08-29: 2 g via INTRAVENOUS
  Filled 2023-08-29: qty 100

## 2023-08-29 MED ORDER — LACTATED RINGERS IV SOLN
INTRAVENOUS | Status: DC
Start: 2023-08-29 — End: 2023-08-29

## 2023-08-29 SURGICAL SUPPLY — 75 items
BLADE CLIPPER SURG (BLADE) IMPLANT
CANISTER SUCT 3000ML PPV (MISCELLANEOUS) ×4 IMPLANT
CANNULA REDUCER 12-8 DVNC XI (CANNULA) ×4 IMPLANT
CNTNR URN SCR LID CUP LEK RST (MISCELLANEOUS) ×10 IMPLANT
CONN ST 1/4X3/8 BEN (MISCELLANEOUS) IMPLANT
DEFOGGER SCOPE WARMER CLEARIFY (MISCELLANEOUS) ×2 IMPLANT
DERMABOND ADVANCED .7 DNX12 (GAUZE/BANDAGES/DRESSINGS) ×2 IMPLANT
DRAIN CHANNEL 28F RND 3/8 FF (WOUND CARE) IMPLANT
DRAIN CHANNEL 32F RND 10.7 FF (WOUND CARE) IMPLANT
DRAPE ARM DVNC X/XI (DISPOSABLE) ×8 IMPLANT
DRAPE COLUMN DVNC XI (DISPOSABLE) ×2 IMPLANT
DRAPE CV SPLIT W-CLR ANES SCRN (DRAPES) ×2 IMPLANT
DRAPE HALF SHEET 40X57 (DRAPES) ×2 IMPLANT
DRAPE INCISE IOBAN 66X45 STRL (DRAPES) IMPLANT
DRAPE SURG ORHT 6 SPLT 77X108 (DRAPES) ×2 IMPLANT
ELECT BLADE 6.5 EXT (BLADE) ×2 IMPLANT
ELECT REM PT RETURN 9FT ADLT (ELECTROSURGICAL) ×2
ELECTRODE REM PT RTRN 9FT ADLT (ELECTROSURGICAL) ×2 IMPLANT
FORCEPS BPLR FENES DVNC XI (FORCEP) IMPLANT
FORCEPS BPLR LNG DVNC XI (INSTRUMENTS) IMPLANT
GAUZE KITTNER 4X5 RF (MISCELLANEOUS) ×4 IMPLANT
GAUZE SPONGE 4X4 12PLY STRL (GAUZE/BANDAGES/DRESSINGS) ×2 IMPLANT
GLOVE SS BIOGEL STRL SZ 7.5 (GLOVE) ×4 IMPLANT
GLOVE SURG POLYISO LF SZ8 (GLOVE) ×2 IMPLANT
GOWN STRL REUS W/ TWL LRG LVL3 (GOWN DISPOSABLE) ×4 IMPLANT
GOWN STRL REUS W/ TWL XL LVL3 (GOWN DISPOSABLE) ×4 IMPLANT
GOWN STRL REUS W/TWL 2XL LVL3 (GOWN DISPOSABLE) ×2 IMPLANT
GRASPER TIP-UP FEN DVNC XI (INSTRUMENTS) IMPLANT
HEMOSTAT SURGICEL 2X14 (HEMOSTASIS) ×6 IMPLANT
IRRIGATION STRYKERFLOW (MISCELLANEOUS) ×2 IMPLANT
IRRIGATOR STRYKERFLOW (MISCELLANEOUS) ×2
KIT BASIN OR (CUSTOM PROCEDURE TRAY) ×2 IMPLANT
KIT SUCTION CATH 14FR (SUCTIONS) IMPLANT
KIT TURNOVER KIT B (KITS) ×2 IMPLANT
NDL HYPO 25GX1X1/2 BEV (NEEDLE) ×2 IMPLANT
NDL SPNL 22GX3.5 QUINCKE BK (NEEDLE) ×2 IMPLANT
NEEDLE HYPO 25GX1X1/2 BEV (NEEDLE) ×2
NEEDLE SPNL 22GX3.5 QUINCKE BK (NEEDLE) ×2
NS IRRIG 1000ML POUR BTL (IV SOLUTION) ×4 IMPLANT
PACK CHEST (CUSTOM PROCEDURE TRAY) ×2 IMPLANT
PAD ARMBOARD 7.5X6 YLW CONV (MISCELLANEOUS) ×4 IMPLANT
PORT ACCESS TROCAR AIRSEAL 12 (TROCAR) ×2 IMPLANT
RELOAD STAPLE 45 2.5 WHT DVNC (STAPLE) IMPLANT
RELOAD STAPLE 45 3.5 BLU DVNC (STAPLE) IMPLANT
RELOAD STAPLE 45 4.3 GRN DVNC (STAPLE) IMPLANT
SCISSORS LAP 5X35 DISP (ENDOMECHANICALS) IMPLANT
SEAL UNIV 5-12 XI (MISCELLANEOUS) ×8 IMPLANT
SET TRI-LUMEN FLTR TB AIRSEAL (TUBING) ×2 IMPLANT
SOL ELECTROSURG ANTI STICK (MISCELLANEOUS) ×2
SOLUTION ELECTROSURG ANTI STCK (MISCELLANEOUS) ×2 IMPLANT
SPONGE INTESTINAL PEANUT (DISPOSABLE) IMPLANT
SPONGE TONSIL 1 RF SGL (DISPOSABLE) IMPLANT
STAPLER 45 SUREFORM CVD DVNC (STAPLE) IMPLANT
STAPLER RELOAD 2.5X45 WHT DVNC (STAPLE) ×8
STAPLER RELOAD 3.5X45 BLU DVNC (STAPLE) ×4
STAPLER RELOAD 4.3X45 GRN DVNC (STAPLE) ×4
SUT PROLENE 4-0 RB1 .5 CRCL 36 (SUTURE) IMPLANT
SUT SILK 1 MH (SUTURE) ×2 IMPLANT
SUT SILK 2 0 SH (SUTURE) ×2 IMPLANT
SUT SILK 2 0SH CR/8 30 (SUTURE) IMPLANT
SUT SILK 3 0SH CR/8 30 (SUTURE) IMPLANT
SUT VIC AB 1 CTX36XBRD ANBCTR (SUTURE) ×2 IMPLANT
SUT VIC AB 2-0 CTX 36 (SUTURE) ×2 IMPLANT
SUT VIC AB 3-0 X1 27 (SUTURE) ×4 IMPLANT
SUT VICRYL 0 TIES 12 18 (SUTURE) ×2 IMPLANT
SUT VICRYL 0 UR6 27IN ABS (SUTURE) ×4 IMPLANT
SUT VICRYL 2 TP 1 (SUTURE) IMPLANT
SYR 20CC LL (SYRINGE) ×4 IMPLANT
SYSTEM RETRIEVAL ANCHOR 15 (MISCELLANEOUS) IMPLANT
SYSTEM SAHARA CHEST DRAIN ATS (WOUND CARE) ×2 IMPLANT
TAPE CLOTH 4X10 WHT NS (GAUZE/BANDAGES/DRESSINGS) ×2 IMPLANT
TAPE CLOTH SURG 4X10 WHT LF (GAUZE/BANDAGES/DRESSINGS) IMPLANT
TOWEL GREEN STERILE (TOWEL DISPOSABLE) ×2 IMPLANT
TRAY FOLEY MTR SLVR 16FR STAT (SET/KITS/TRAYS/PACK) ×2 IMPLANT
WATER STERILE IRR 1000ML POUR (IV SOLUTION) ×4 IMPLANT

## 2023-08-29 NOTE — Anesthesia Procedure Notes (Signed)
Central Venous Catheter Insertion Performed by: Marcene Duos, MD, anesthesiologist Start/End2/06/2024 7:42 AM, 08/29/2023 7:52 AM Patient location: Pre-op. Preanesthetic checklist: patient identified, IV checked, site marked, risks and benefits discussed, surgical consent, monitors and equipment checked, pre-op evaluation, timeout performed and anesthesia consent Position: Trendelenburg Lidocaine 1% used for infiltration and patient sedated Hand hygiene performed , maximum sterile barriers used  and Seldinger technique used Catheter size: 8 Fr Total catheter length 16. Central line was placed.Double lumen Procedure performed using ultrasound guided technique. Ultrasound Notes:anatomy identified, needle tip was noted to be adjacent to the nerve/plexus identified, no ultrasound evidence of intravascular and/or intraneural injection and image(s) printed for medical record Attempts: 1 Following insertion, dressing applied, line sutured and Biopatch. Post procedure assessment: blood return through all ports  Patient tolerated the procedure well with no immediate complications.

## 2023-08-29 NOTE — Plan of Care (Signed)

## 2023-08-29 NOTE — Interval H&P Note (Signed)
History and Physical Interval Note:  PET- no adenopathy or distant mets, mild uptake at primary- Clinical stage IA  08/29/2023 7:37 AM  Tammy [name]  has presented today for surgery, with the diagnosis of LLL ADENOCARCINOMA.  The various methods of treatment have been discussed with the patient and family. After consideration of risks, benefits and other options for treatment, the patient has consented to  Procedure(s): XI ROBOTIC ASSISTED THORACOSCOPY-LEFT LOWER LOBECTOMY (Left) as a surgical intervention.  [name] history has been reviewed, patient examined, no change in status, stable for surgery.  I have reviewed the patient's chart and labs.  Questions were answered to the patient's satisfaction.     Loreli Slot

## 2023-08-29 NOTE — Anesthesia Postprocedure Evaluation (Signed)
Anesthesia Post Note  Patient: Elienai Gailey Smedley  Procedure(s) Performed: XI ROBOTIC ASSISTED THORACOSCOPY-LEFT LOWER LOBECTOMY (Left: Chest) LYMPH NODE DISSECTION (Left) INTERCOSTAL NERVE BLOCK (Left)     Patient location during evaluation: PACU Anesthesia Type: General Level of consciousness: awake and alert Pain management: pain level controlled Vital Signs Assessment: post-procedure vital signs reviewed and stable Respiratory status: spontaneous breathing, nonlabored ventilation, respiratory function stable and patient connected to nasal cannula oxygen Cardiovascular status: blood pressure returned to baseline and stable Postop Assessment: no apparent nausea or vomiting Anesthetic complications: no  No notable events documented.  Last Vitals:  Vitals:   08/29/23 1345 08/29/23 1356  BP: 113/61 134/67  Pulse: 67 74  Resp: 13 19  Temp:  36.4 C  SpO2: 94% 96%    Last Pain:  Vitals:   08/29/23 1300  TempSrc:   PainSc: 0-No pain                 Kennieth Rad

## 2023-08-29 NOTE — Anesthesia Procedure Notes (Signed)
Procedure Name: Intubation Date/Time: 08/29/2023 8:43 AM  Performed by: Randon Goldsmith, CRNAPre-anesthesia Checklist: Patient identified, Emergency Drugs available, Suction available and Patient being monitored Patient Re-evaluated:Patient Re-evaluated prior to induction Oxygen Delivery Method: Circle system utilized Preoxygenation: Pre-oxygenation with 100% oxygen Induction Type: IV induction Ventilation: Mask ventilation without difficulty Laryngoscope Size: Mac and 3 Grade View: Grade I Endobronchial tube: Left, Double lumen EBT and EBT position confirmed by auscultation and 37 Fr Number of attempts: 1 Airway Equipment and Method: Stylet and Oral airway Placement Confirmation: ETT inserted through vocal cords under direct vision, positive ETCO2 and breath sounds checked- equal and bilateral Tube secured with: Tape Dental Injury: Teeth and Oropharynx as per pre-operative assessment  Comments: [employer]

## 2023-08-29 NOTE — Brief Op Note (Signed)
08/29/2023  11:08 AM  PATIENT:  Tammy [name]  73 y.o. female  PRE-OPERATIVE DIAGNOSIS:  LLL ADENOCARCINOMA  POST-OPERATIVE DIAGNOSIS:  LLL ADENOCARCINOMA  PROCEDURE:  Procedure(s):  XI ROBOTIC ASSISTED THORACOSCOPY LEFT LOWER LOBECTOMY (Left) LYMPH NODE DISSECTION (Left) INTERCOSTAL NERVE BLOCK (Left)  SURGEON:  Surgeons and Role:    * Loreli Slot, MD - Primary  PHYSICIAN ASSISTANT: Lowella Dandy PA-C  ASSISTANTS: none   ANESTHESIA:   general  EBL:  75 mL   BLOOD ADMINISTERED:none  DRAINS:  28 Blake Drain    LOCAL MEDICATIONS USED:  BUPIVICAINE   SPECIMEN:  Source of Specimen:  Left lower lobe, Lymph Nodes,   DISPOSITION OF SPECIMEN:  PATHOLOGY  COUNTS:  YES  TOURNIQUET:  * No tourniquets in log *  DICTATION: .Dragon Dictation  PLAN OF CARE: Admit to inpatient   PATIENT DISPOSITION:  PACU - hemodynamically stable.   Delay start of Pharmacological VTE agent (>24hrs) due to surgical blood loss or risk of bleeding: no

## 2023-08-29 NOTE — Anesthesia Procedure Notes (Signed)
Arterial Line Insertion Start/End2/06/2024 8:10 AM, 08/29/2023 8:12 AM Performed by: Randon Goldsmith, CRNA, CRNA  Patient location: Pre-op. Preanesthetic checklist: patient identified, IV checked, site marked, risks and benefits discussed, surgical consent, monitors and equipment checked, pre-op evaluation, timeout performed and anesthesia consent Lidocaine 1% used for infiltration Left, radial was placed Catheter size: 20 G Hand hygiene performed , maximum sterile barriers used  and Seldinger technique used Allen's test indicative of satisfactory collateral circulation Attempts: 2 Procedure performed without using ultrasound guided technique. Following insertion, dressing applied. Post procedure assessment: normal and unchanged  Post procedure complications: unsuccessful attempts. Patient tolerated the procedure well with no immediate complications. Additional procedure comments: Attempt x1 by SRNA; 2nd attempt by CRNA successful arterial line placement .

## 2023-08-29 NOTE — Op Note (Signed)
NAME: Tammy [name], FATA MEDICAL RECORD NO: [id number] ACCOUNT NO: [id number] DATE OF BIRTH: 03/17/1951 FACILITY: [facility] LOCATION: [facility] PHYSICIAN: Salvatore Decent. Dorris Fetch, MD  Operative Report   DATE OF PROCEDURE: 08/29/2023  PREOPERATIVE DIAGNOSIS:  Adenocarcinoma left lower lobe, clinical stage IA.  POSTOPERATIVE DIAGNOSIS:  Adenocarcinoma left lower lobe, clinical stage IA.  PROCEDURE:   Xi robotic assisted left lower lobectomy,  Lymph node dissection,  Intercostal nerve blocks levels 3 through 10.  SURGEON:  Salvatore Decent. Dorris Fetch, MD  ASSISTANT:  Lowella Dandy, PA.  Experienced assistance was necessary for this case due to surgical complexity.  Erin Barrett assisted with port placement, camera management, robot docking and undocking, instrument exchange, specimen retrieval, suctioning, and wound closure.  ANESTHESIA:  General.  FINDINGS:  Unusual pulmonary arterial anatomy.  Frozen section of bronchial margin negative for tumor.  CLINICAL NOTE: [name] is a 73 year old woman with a history of tobacco use who was found to have a ground-glass opacity in the left lower lobe on a low-dose CT for lung cancer screening about 3 years ago.  It was followed over time.  A PET/CT showed no significant activity.  Recently, a CT showed an increase in size and a more prominent solid component.  Navigational bronchoscopy showed adenocarcinoma.  Repeat PET showed no significant activity in any lymph nodes and only very mild activity in the nodule.  The patient was offered surgical resection. Given the size and location of the lesion, it was felt a lobectomy would be necessary.  The indications, risks, benefits, and alternatives were discussed in detail with the patient.  She understood and accepted the risks and agreed to proceed.  OPERATIVE NOTE: [name] was brought to the operating room on 08/29/2023.  Anesthesia was induced and she was intubated with a double-lumen endotracheal tube.   Intravenous antibiotics were administered.  A Foley catheter was placed.  Sequential compression devices were placed on the calves for DVT prophylaxis.  She was placed in a right lateral decubitus position and the left chest was prepped and draped in the usual fashion.  A Bair Hugger was then placed for active warming.  Single lung  ventilation of the right lung was initiated and was tolerated well throughout the procedure.  A time-out was performed.  A solution containing 20 mL of liposomal bupivacaine, 30 mL of 0.5% bupivacaine, and 50 mL of saline was prepared.  This solution was used for local at the incision sites as well as for the intercostal nerve blocks.  An incision was made in the eighth interspace in the mid-axillary line and an 8 mm robotic port was inserted.  The thoracoscope was advanced into the chest.  After confirming intrapleural placement, carbon dioxide was insufflated per protocol.  A 12 mm robotic port was placed in the eighth interspace anterior to the camera port.  Intercostal nerve blocks then were performed from the third to the tenth interspace by injecting 10 mL of the bupivacaine solution into a subpleural plane at each level.  A 12 mm AirSeal port was placed in the tenth interspace posterolaterally and then two additional eighth interspace robotic ports were placed.  The robot was deployed.  The camera arm was docked.  Targeting was performed.  The remaining arms were docked.  The robotic instruments were inserted with thoracoscopic visualization.  There was good isolation of the left lung, but it was relatively slow to deflate.  Dissection was performed using bipolar cautery.  The lung was retracted superiorly and the inferior pulmonary  ligament was divided.  Two level 9 nodes were removed.  One of these nodes was extremely small.  Both appeared benign.  All of the lymph nodes removed during the procedure did appear to be benign and were all sent for permanent pathology.  The  lung was retracted anteriorly and the pleural reflexion was divided at the hilum posteriorly.  Level 8 and level 7 nodes were removed.  There also was a level 10 node removed.  The initial dissection on the pulmonary artery was performed.  The lung then was retracted inferiorly and the pleural reflexion was divided superiorly.  A level 5 AP window node was removed.  The lung was very difficult to retract posteriorly because of the relative size and being slow to deflate, so the fissure was inspected.  There was a visible pair of level 12 lymph nodes in the fissure.  The pleura overlying them was incised and these nodes were dissected out.  The fissure was completed posteriorly with bipolar cautery.  Anteriorly, the fissure was incomplete and was divided using the robotic stapler.  A level 11 node was removed.  The inferior pulmonary vein then was encircled and divided with the robotic stapler.  The arterial anatomy was unusual in the origin and course of the branches to the lower lobe.  There also was a lingular branch that came off relatively far down on the basilar segmental trunk of the lower lobe.  The decision was made to divide the bronchus first.  The stapler was placed across the bronchus and closed.  The test inflation showed good aeration of the upper lobe.  The stapler was fired, transecting the  lower lobe bronchus.  It was then determined that the pulmonary artery branches could not be divided with a single firing of the stapler.  The superior segmental branches were dissected free and divided with a stapler and then the most lateral basilar segmental branch was also encircled and divided.  Finally, the remainder of the basilar trunk was divided, taking care not to impinge on the lingular artery branch.  The vessel loop and sponges used during the dissection were removed.  The chest was  copiously irrigated with saline and test inflation to 30 cm of water pressure revealed no leakage.  The robotic  instruments were removed and the robot was undocked.  The anterior eighth interspace incision was lengthened to 3 cm.  The lower lobe was placed into a 15 mm endoscopic retrieval bag, removed and sent for a frozen section of the bronchial margin, which subsequently returned with no tumor seen.  The port sites and staple lines were inspected and there was good hemostasis.  A 28-French Blake drain was  placed through the original eighth interspace incision and directed to the apex.  It was secured with a #1 silk suture.  Dual lung ventilation was resumed.  The remaining incisions were closed in standard fashion.  All sponge, needle, and instrument counts were correct at the end of the procedure.  The chest tube was placed to a Pleur-evac on water seal.  The patient was placed back in the supine position.  She was extubated in the operating room and taken to the post-anesthesia care unit in good condition.   VAI D: 08/29/2023 5:32:34 pm T: 08/29/2023 10:34:00 pm  JOB: [id number]/ [id number]

## 2023-08-29 NOTE — Hospital Course (Addendum)
History of Present Illness:  Tammy [name] is a 73 year old woman with a history of tobacco abuse (30 pack years prior to quitting in 2010), COPD, aortic atherosclerosis, coronary atherosclerosis, HFpEF, paroxysmal atrial fibrillation, hiatal hernia, hypertension, hyperlipidemia, fibromyalgia, degenerative joint disease, type 2 diabetes, obstructive sleep apnea on CPAP, obesity, and an adenocarcinoma of the left lower lobe.  First noted to have a left lower lobe groundglass opacity on a low-dose CT for lung cancer screening about 3 years ago.  She has been followed since then.  She had a PET in 2023 which showed no significant activity in the nodule or any lymph nodes.  She had a biopsy in 2023 that was nondiagnostic.  More recently her CT showed an increase in size and also a more prominent solid component than previously.  She underwent a navigational bronchoscopy by Dr. Jayme Cloud and biopsy showed a mucinous adenocarcinoma.  She was evaluated by Dr. Dorris Fetch at which time she stated she can walk about a mile if she is using a push cart or half a mile without it.  She can go up a flight of stairs without having to stop because of shortness of breath.  She denies any chest pain, pressure, or tightness.  Not aware of any recent episodes of atrial fibrillation.  Denies any unusual headaches or visual changes.  She has lost 100 pounds in 18 months on Mounjaro.  She has lost about 15 pounds over the past 3 months.  He recommended the patient undergo PET/CT and cardiac clearance.  Once those were completed he felt Robotic assisted left lower lobectomy would be indicated.  The risks and benefits of the procedure were explained to the patient and she was agreeable to proceed.  Hospital Course:  Tammy [name] presented to [facility] on 08/29/2023.  She was taken to the operating room and underwent Robotic Assisted Video Thoracoscopy with Left Lower Lobectomy, Lymph Node Dissection, and Intercostal Nerve  Block.  She tolerated the procedure without difficulty, was extubated, and taken to the PACU in stable condition.

## 2023-08-29 NOTE — Transfer of Care (Signed)
Immediate Anesthesia Transfer of Care Note  Patient: Tammy [name]  Procedure(s) Performed: XI ROBOTIC ASSISTED THORACOSCOPY-LEFT LOWER LOBECTOMY (Left: Chest) LYMPH NODE DISSECTION (Left) INTERCOSTAL NERVE BLOCK (Left)  Patient Location: PACU  Anesthesia Type:General  Level of Consciousness: awake, alert , and oriented  Airway & Oxygen Therapy: Patient Spontanous Breathing  Post-op Assessment: Report given to RN and Post -op Vital signs reviewed and stable  Post vital signs: Reviewed and stable  Last Vitals:  Vitals Value Taken Time  BP 119/62 08/29/23 1133  Temp    Pulse 75 08/29/23 1135  Resp 15 08/29/23 1135  SpO2 89 % 08/29/23 1135  Vitals shown include unfiled device data.  Last Pain:  Vitals:   08/29/23 0627  TempSrc: Oral  PainSc:       Patients Stated Pain Goal: 0 (08/29/23 7829)  Complications: No notable events documented.

## 2023-08-30 ENCOUNTER — Inpatient Hospital Stay (HOSPITAL_COMMUNITY): Payer: Medicare Other

## 2023-08-30 ENCOUNTER — Encounter (HOSPITAL_COMMUNITY): Payer: Self-pay | Admitting: Thoracic Surgery (Cardiothoracic Vascular Surgery)

## 2023-08-30 ENCOUNTER — Ambulatory Visit: Payer: Medicare Other | Admitting: Pulmonary Disease

## 2023-08-30 LAB — CBC
HCT: 35 % — ABNORMAL LOW (ref 36.0–46.0)
Hemoglobin: 11.2 g/dL — ABNORMAL LOW (ref 12.0–15.0)
MCH: 28.1 pg (ref 26.0–34.0)
MCHC: 32 g/dL (ref 30.0–36.0)
MCV: 87.9 fL (ref 80.0–100.0)
Platelets: 237 10*3/uL (ref 150–400)
RBC: 3.98 MIL/uL (ref 3.87–5.11)
RDW: 14.7 % (ref 11.5–15.5)
WBC: 11.9 10*3/uL — ABNORMAL HIGH (ref 4.0–10.5)
nRBC: 0 % (ref 0.0–0.2)

## 2023-08-30 LAB — GLUCOSE, CAPILLARY
Glucose-Capillary: 127 mg/dL — ABNORMAL HIGH (ref 70–99)
Glucose-Capillary: 168 mg/dL — ABNORMAL HIGH (ref 70–99)
Glucose-Capillary: 102 mg/dL — ABNORMAL HIGH (ref 70–99)
Glucose-Capillary: 124 mg/dL — ABNORMAL HIGH (ref 70–99)
Glucose-Capillary: 123 mg/dL — ABNORMAL HIGH (ref 70–99)

## 2023-08-30 LAB — BASIC METABOLIC PANEL
Anion gap: 6 (ref 5–15)
BUN: 15 mg/dL (ref 8–23)
CO2: 26 mmol/L (ref 22–32)
Calcium: 9.2 mg/dL (ref 8.9–10.3)
Chloride: 104 mmol/L (ref 98–111)
Creatinine, Ser: 0.96 mg/dL (ref 0.44–1.00)
GFR, Estimated: >60 mL/min (ref >60)
Glucose, Bld: 133 mg/dL — ABNORMAL HIGH (ref 70–99)
Potassium: 4.3 mmol/L (ref 3.5–5.1)
Sodium: 136 mmol/L (ref 135–145)

## 2023-08-30 MED ORDER — METFORMIN HCL 500 MG PO TABS
500.0000 mg | ORAL_TABLET | Freq: Two times a day (BID) | ORAL | Status: DC
  Administered 2023-08-30 – 2023-08-31 (×2): 500 mg via ORAL
  Filled 2023-08-30 (×2): qty 1

## 2023-08-30 MED ORDER — INSULIN ASPART 100 UNIT/ML IJ SOLN
0.0000 [IU] | Freq: Three times a day (TID) | INTRAMUSCULAR | Status: DC
  Administered 2023-08-30: 2 [IU] via SUBCUTANEOUS

## 2023-08-30 NOTE — Discharge Instructions (Signed)
Discharge Instructions:  1. You may shower, please wash incisions daily with soap and water and keep dry.  If you wish to cover wounds with dressing you may do so but please keep clean and change daily.  No tub baths or swimming until incisions have completely healed.  If your incisions become red or develop any drainage please call our office at [phone]  2. No Driving until cleared by Dr. Sunday Corn office and you are no longer using narcotic pain medications  3. Fever of 101.5 for at least 24 hours with no source, please contact our office at [phone]  4. Activity- up as tolerated, please walk at least 3 times per day.  Avoid strenuous activity  5. If any questions or concerns arise, please do not hesitate to contact our office at [phone]

## 2023-08-30 NOTE — Progress Notes (Signed)
Pt's chest tube dressing completely soiled, once dressing was removed, around the insertion site significant accumulation of fluid was assessed. Notified charge RN & rapid response RN, advised to change dressing & continue to monitor. Pt did not have any complaints of pain or shortness of breath.  Bari Edward, RN

## 2023-08-30 NOTE — Progress Notes (Addendum)
      [address]       [name] [address]             [phone]      1 Day Post-Op Procedure(s) (LRB): XI ROBOTIC ASSISTED THORACOSCOPY-LEFT LOWER LOBECTOMY (Left) LYMPH NODE DISSECTION (Left) INTERCOSTAL NERVE BLOCK (Left)  Subjective:  Patient doing very well.  Pain is well controlled.  She states her pain is worse with deep breathing or coughing.  Denies N/V.  She is passing gas  Objective: Vital signs in last 24 hours: Temp:  [97.6 F (36.4 C)-97.8 F (36.6 C)] 97.7 F (36.5 C) (02/13 0404) Pulse Rate:  [66-83] 77 (02/13 0404) Cardiac Rhythm: Normal sinus rhythm (02/12 1900) Resp:  [11-22] 20 (02/13 0404) BP: (113-134)/(53-83) 122/64 (02/13 0404) SpO2:  [93 %-100 %] 95 % (02/13 0404) Arterial Line BP: (133-148)/(46-59) 137/46 (02/12 1345) Intake/Output from previous day: 02/12 0701 - 02/13 0700 In: 3280.5 [P.O.:680; I.V.:2050.5; IV Piggyback:550] Out: 985 [Urine:610; Blood:75; Chest Tube:300]  General appearance: alert, cooperative, and no distress Heart: regular rate and rhythm Lungs: clear to auscultation bilaterally Abdomen: soft, non-tender; bowel sounds normal; no masses,  no organomegaly Extremities: extremities normal, atraumatic, no cyanosis or edema Wound: clean and dry  Lab Results: Recent Labs    08/27/23 1429 08/30/23 0405  WBC 9.0 11.9*  HGB 13.4 11.2*  HCT 43.0 35.0*  PLT 351 237   BMET:  Recent Labs    08/27/23 1429 08/30/23 0405  NA 138 136  K 3.7 4.3  CL 99 104  CO2 25 26  GLUCOSE 97 133*  BUN 19 15  CREATININE 0.87 0.96  CALCIUM 10.4* 9.2    PT/INR:  Recent Labs    08/27/23 1429  LABPROT 15.3*  INR 1.2   ABG    Component Value Date/Time   TCO2 29 04/30/2008 1126   CBG (last 3)  Recent Labs    08/29/23 1943 08/29/23 2313 08/30/23 0403  GLUCAP 163* 144* 127*    Assessment/Plan: S/P Procedure(s) (LRB): XI ROBOTIC ASSISTED THORACOSCOPY-LEFT LOWER LOBECTOMY (Left) LYMPH NODE DISSECTION  (Left) INTERCOSTAL NERVE BLOCK (Left)  CV- Chronic A. Fib, H/O HTN- on Cardizem, Avapro, Imdur, and Sotalol... will resume Eliquis tomorrow Pulm- CT on water seal w/o air leak, 300 cc output since surgery... CXR w/o significant pneumothorax.. possibly d/c chest tube today.Marland Kitchen good use of IS Renal- creatinine remains WNL, tolerating diet w/o N/V.Marland Kitchen stop IV fluids, okay to continue Toradol use prn DM- continue to hold Monjaro.. will resume Metformin, change SSIP to TID/HS D/C Central Line, Foley catheter Lovenox for DVT prophylaxis   LOS: 1 day    Lowella Dandy, PA-C 08/30/2023  Patient seen and examined, agree with above No air leak- dc chest tube [employer] C. Dorris Fetch, MD [facility] [phone]

## 2023-08-30 NOTE — Plan of Care (Signed)

## 2023-08-30 NOTE — Progress Notes (Signed)
Removed patients Right internal jugular and foley with no complications.

## 2023-08-30 NOTE — TOC CM/SW Note (Signed)
Transition of Care [facility]) - Inpatient Brief Assessment   Patient Details  Name: Tammy [name] MRN: [id number] Date of Birth: 1951/02/02  Transition of Care [facility]) CM/SW Contact:    Harriet Masson, RN Phone Number: 08/30/2023, 12:41 PM   Clinical Narrative:  1 Day Post-Op Procedure(s) (LRB): XI ROBOTIC ASSISTED THORACOSCOPY-LEFT LOWER LOBECTOMY. Transition of Care Department [facility]) has reviewed patient and no TOC needs have been identified at this time. We will continue to monitor patient advancement through interdisciplinary progression rounds. If new patient transition needs arise, please place a TOC consult.  Transition of Care Asessment: Insurance and Status: Insurance coverage has been reviewed Patient has primary care physician: Yes Home environment has been reviewed: safe to discharge home Prior level of function:: independent Prior/Current Home Services: No current home services Social Drivers of Health Review: SDOH reviewed no interventions necessary Readmission risk has been reviewed: Yes Transition of care needs: no transition of care needs at this time

## 2023-08-30 NOTE — Progress Notes (Signed)
Patients' chest tube has been removed with zero complications.

## 2023-08-30 NOTE — Plan of Care (Signed)
  Problem: Education: Goal: Knowledge of General Education information will improve Description: Including pain rating scale, medication(s)/side effects and non-pharmacologic comfort measures Outcome: Progressing   Problem: Health Behavior/Discharge Planning: Goal: Ability to manage health-related needs will improve Outcome: Progressing   Problem: Clinical Measurements: Goal: Ability to maintain clinical measurements within normal limits will improve Outcome: Progressing Goal: Will remain free from infection Outcome: Progressing Goal: Diagnostic test results will improve Outcome: Progressing Goal: Respiratory complications will improve Outcome: Progressing Goal: Cardiovascular complication will be avoided Outcome: Progressing   Problem: Activity: Goal: Risk for activity intolerance will decrease Outcome: Progressing   Problem: Nutrition: Goal: Adequate nutrition will be maintained Outcome: Progressing   Problem: Coping: Goal: Level of anxiety will decrease Outcome: Progressing   Problem: Elimination: Goal: Will not experience complications related to bowel motility Outcome: Progressing Goal: Will not experience complications related to urinary retention Outcome: Progressing   Problem: Pain Managment: Goal: General experience of comfort will improve and/or be controlled Outcome: Progressing   Problem: Safety: Goal: Ability to remain free from injury will improve Outcome: Progressing   Problem: Education: Goal: Knowledge of disease or condition will improve Outcome: Progressing Goal: Knowledge of the prescribed therapeutic regimen will improve Outcome: Progressing   Problem: Activity: Goal: Risk for activity intolerance will decrease Outcome: Progressing   Problem: Cardiac: Goal: Will achieve and/or maintain hemodynamic stability Outcome: Progressing   Problem: Clinical Measurements: Goal: Postoperative complications will be avoided or minimized Outcome:  Progressing   Problem: Respiratory: Goal: Respiratory status will improve Outcome: Progressing   Problem: Pain Management: Goal: Pain level will decrease Outcome: Progressing

## 2023-08-30 NOTE — Discharge Summary (Addendum)
Physician Discharge Summary  Patient ID: Natarsha Hurwitz MRN: [id number] DOB/AGE: 11-21-1950 73 y.o.  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, N0)  Patient Active Problem List   Diagnosis Date Noted   Mediastinal adenopathy 08/01/2023   Status post total left knee replacement 05/18/2023   Unstable angina (HCC) 04/20/2023   Other chest pain 12/08/2022   COPD suggested by initial evaluation (HCC) 04/20/2022   Nodule of lower lobe of left lung 11/11/2021   Fatigue 12/30/2020   Elevated coronary artery calcium score 12/30/2020   Unilateral primary osteoarthritis, left knee 10/06/2020   Respiratory failure, acute (HCC) 06/28/2020   Acute diastolic CHF (congestive heart failure) (HCC) 06/28/2020   Atrial fibrillation with rapid ventricular response (HCC) 06/28/2020   Acquired thrombophilia (HCC)    Depression    Atrial fibrillation with RVR (HCC) 06/16/2020   Diabetes mellitus (HCC)    HTN (hypertension)    Sleep apnea    Obesity, Class III, BMI 40-49.9 (morbid obesity) (HCC)    Chronic venous insufficiency 12/30/2018   Varicose veins of both lower extremities with inflammation 12/30/2018   DJD (degenerative joint disease) 12/30/2018   Snoring 11/27/2018   Daytime sleepiness 11/27/2018   Educated about COVID-19 virus infection 11/27/2018   SOB (shortness of breath) 11/27/2018   Hyperlipidemia 05/20/2015   Knee pain 06/06/2012   Discharge Diagnoses: 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, pN0)  Patient Active Problem List   Diagnosis Date Noted   S/P Robotic Assisted Video Thoracoscopy with Left Lower Lobectomy of Lung 08/29/2023   Mediastinal adenopathy 08/01/2023   Status post total left knee replacement 05/18/2023   Unstable angina (HCC) 04/20/2023   Other chest pain 12/08/2022   COPD suggested by initial evaluation (HCC) 04/20/2022   Nodule of lower lobe of left  lung 11/11/2021   Fatigue 12/30/2020   Elevated coronary artery calcium score 12/30/2020   Unilateral primary osteoarthritis, left knee 10/06/2020   Respiratory failure, acute (HCC) 06/28/2020   Acute diastolic CHF (congestive heart failure) (HCC) 06/28/2020   Atrial fibrillation with rapid ventricular response (HCC) 06/28/2020   Acquired thrombophilia (HCC)    Depression    Atrial fibrillation with RVR (HCC) 06/16/2020   Diabetes mellitus (HCC)    HTN (hypertension)    Sleep apnea    Obesity, Class III, BMI 40-49.9 (morbid obesity) (HCC)    Chronic venous insufficiency 12/30/2018   Varicose veins of both lower extremities with inflammation 12/30/2018   DJD (degenerative joint disease) 12/30/2018   Snoring 11/27/2018   Daytime sleepiness 11/27/2018   Educated about COVID-19 virus infection 11/27/2018   SOB (shortness of breath) 11/27/2018   Hyperlipidemia 05/20/2015   Knee pain 06/06/2012   Discharged Condition: good  History of Present Illness:  Tammy [name] is a 73 year old woman with a history of tobacco abuse (30 pack years prior to quitting in 2010), COPD, aortic atherosclerosis, coronary atherosclerosis, HFpEF, paroxysmal atrial fibrillation, hiatal hernia, hypertension, hyperlipidemia, fibromyalgia, degenerative joint disease, type 2 diabetes, obstructive sleep apnea on CPAP, obesity, and an adenocarcinoma of the left lower lobe.  First noted to have a left lower lobe groundglass opacity on a low-dose CT for lung cancer screening about 3 years ago.  She has been followed since then.  She had a PET in 2023 which showed no significant activity in the nodule or any lymph nodes.  She had a biopsy in 2023 that was nondiagnostic.  More recently her CT showed an increase  in size and also a more prominent solid component than previously.  She underwent a navigational bronchoscopy by Dr. Jayme Cloud and biopsy showed a mucinous adenocarcinoma.  She was evaluated by Dr. Dorris Fetch at which  time she stated she can walk about a mile if she is using a push cart or half a mile without it.  She can go up a flight of stairs without having to stop because of shortness of breath.  She denies any chest pain, pressure, or tightness.  Not aware of any recent episodes of atrial fibrillation.  Denies any unusual headaches or visual changes.  She has lost 100 pounds in 18 months on Mounjaro.  She has lost about 15 pounds over the past 3 months.  He recommended the patient undergo PET/CT and cardiac clearance.  Once those were completed he felt Robotic assisted left lower lobectomy would be indicated.  The risks and benefits of the procedure were explained to the patient and she was agreeable to proceed.  Hospital Course:  Keslee Harrington presented to [facility] on 08/29/2023.  She was taken to the operating room and underwent Robotic Assisted Video Thoracoscopy with Left Lower Lobectomy, Lymph Node Dissection, and Intercostal Nerve Block.  She tolerated the procedure without difficulty, was extubated, and taken to the PACU in stable condition.  The patient has done well post operatively. Her central line and foley catheter were removed in standard fashion.  Her chest tube was on water seal without evidence of air leak.  His chest xray was free from pneumothorax.  Her chest tube was able to be removed on POD #1.  Follow up CXR showed stable apical space.  Repeat 2V CXR showed continued apical space.  She has long standing history of HTN and her medications were resumed.  She has history of Atrial Fibrillation and will be started on her home regimen of Eliquis prior to discharge.  She is diabetic and her sugars remained well controlled.  She was resumed on home Metformin during hospitalization.  However she was not resumed on Monjaro until discharge.  She is ambulating without difficulty.  Her surgical incisions are healing without evidence of infection.  She is stable for discharge home  today.  Consults: None  Significant Diagnostic Studies:   PATHOLOGY LUNG BIOPSY 08-Aug-2023  Accession #: [id number] Patient Name: KETSIA, LINEBAUGH Visit # : [id number]  MRN: [id number] Physician: Sarina Ser DOB/Age [birth year] (Age: 19) Gender: F Collected Date: 08-08-23 Received Date: 2023-08-08  FINAL DIAGNOSIS       1. Lung, lower lobe, Left :      - MUCINOUS ADENOCARCINOMA WITH LEPIDIC PATTERN.      - SEE NOTE.       Diagnosis Note : Per [facility] the patient is a former smoker with an enlarging left      lower lobe nodule that measures up to 3.0 cm with an internal solid component      measuring 1.7 cm. The differential diagnosis for these findings includes      invasive mucinous adenocarcinoma, minimally invasive adenocarcinoma, or mucinous      type of adenocarcinoma in situ.      Dr. Jayme Cloud was notified on 08/03/2023. This case underwent intradepartmental      consultation and Dr. Corey Harold concurs with the interpretation.      See concurrent case [id number].   PET CT:  FINDINGS: Mediastinal blood pool activity: SUV max 2.2   NECK:   No hypermetabolic cervical lymph nodes are identified.Fairly symmetric activity within  the lymphoid tissue of Waldeyer's ring is within physiologic limits. No suspicious activity identified within the pharyngeal mucosal space.   Incidental CT findings: Bilateral carotid atherosclerosis.   CHEST:   There are no hypermetabolic mediastinal, hilar or axillary lymph nodes. Part solid left lower lobe nodule adjacent to a biopsy marker measures 1.6 cm on image 57/6 and demonstrates no hypermetabolic activity (SUV max 1.5). No hypermetabolic pulmonary activity or suspicious nodularity elsewhere.   Incidental CT findings: Mild centrilobular emphysema. Atherosclerosis of the aorta, great vessels and coronary arteries. Brown fat paraspinal activity noted.   ABDOMEN/PELVIS:   There is no hypermetabolic activity within the liver,  adrenal glands, spleen or pancreas. There is no hypermetabolic nodal activity in the abdomen or pelvis. Scattered brown fat activity, including activity surrounding the adrenal glands which appear normal.   Incidental CT findings: Cholelithiasis. Scattered parenchymal calcifications within the pancreas. Aortic and branch vessel atherosclerosis without evidence of aneurysm.   SKELETON:   There is no hypermetabolic activity to suggest osseous metastatic disease.   Incidental CT findings: Previous lumbar fusion and multilevel spondylosis noted.   IMPRESSION: 1. The part solid left lower lobe pulmonary nodule demonstrates no hypermetabolic activity, similar to previous PET-CT. No hypermetabolic pulmonary nodules demonstrated. 2. No evidence of metastatic disease. 3. Cholelithiasis. 4. Aortic and Aortic Atherosclerosis (ICD10-I70.0).     Electronically Signed   By: Carey Bullocks M.D.   On: 08/28/2023 12:25  Treatments: surgery:   NAME: MCKINZI, ERIKSEN MEDICAL RECORD NO: [id number] ACCOUNT NO: [id number] DATE OF BIRTH: 1950/08/30 FACILITY: [facility] LOCATION: [facility] PHYSICIAN: Salvatore Decent. Dorris Fetch, MD   Operative Report    DATE OF PROCEDURE: 08/29/2023   PREOPERATIVE DIAGNOSIS:  Adenocarcinoma left lower lobe, clinical stage IA.   POSTOPERATIVE DIAGNOSIS:  Adenocarcinoma left lower lobe, clinical stage IA.   PROCEDURE:  Xi robotic assisted left lower lobectomy, lymph node dissection, intercostal nerve blocks levels 3 through 10.   SURGEON:  Salvatore Decent. Dorris Fetch, MD   ASSISTANT:  Lowella Dandy, PA.  PATHOLOGY:  Discharge Exam: Blood pressure 108/69, pulse 72, temperature 97.8 F (36.6 C), temperature source Oral, resp. rate 15, height 5' (1.524 m), weight 76.2 kg, SpO2 94%.  General appearance: alert, cooperative, and no distress Heart: regular rate and rhythm Lungs: clear to auscultation bilaterally Abdomen: soft, non-tender; bowel sounds normal; no masses,  no  organomegaly Extremities: extremities normal, atraumatic, no cyanosis or edema Wound: clean and dry  Discharge disposition: 01-Home or Self Care  Allergies as of 08/31/2023       Reactions   Betadine [povidone Iodine] Anaphylaxis   Contrast Media [iodinated Contrast Media] Anaphylaxis   Iodine Anaphylaxis   Metrizamide Anaphylaxis   Povidone-iodine Anaphylaxis   Shellfish Allergy Anaphylaxis        Medication List     TAKE these medications    acetaminophen 500 MG tablet Commonly known as: TYLENOL Take 1-2 tablets (500-1,000 mg total) by mouth every 6 (six) hours as needed.   albuterol 108 (90 Base) MCG/ACT inhaler Commonly known as: VENTOLIN HFA Inhale 2 puffs into the lungs every 6 (six) hours as needed.   diltiazem 240 MG 24 hr capsule Commonly known as: CARDIZEM CD Take 1 capsule (240 mg total) by mouth daily.   DULoxetine 60 MG capsule Commonly known as: CYMBALTA Take 60 mg by mouth daily.   Eliquis 5 MG Tabs tablet Generic drug: apixaban Take 5 mg by mouth 2 (two) times daily.   EPINEPHrine 0.3 mg/0.3 mL Soaj injection Commonly known as:  EPI-PEN Inject 0.3 mg into the muscle as needed for anaphylaxis.   fesoterodine 4 MG Tb24 tablet Commonly known as: TOVIAZ Take 4 mg by mouth daily.   furosemide 20 MG tablet Commonly known as: LASIX Take 20 mg by mouth daily.   gabapentin 300 MG capsule Commonly known as: Neurontin Take 1 capsule (300 mg total) by mouth 2 (two) times daily.   Gemtesa 75 MG Tabs Generic drug: Vibegron Take 1 tablet by mouth daily.   isosorbide mononitrate 30 MG 24 hr tablet Commonly known as: IMDUR Take 30 mg by mouth 2 (two) times daily.   metFORMIN 500 MG tablet Commonly known as: GLUCOPHAGE Take 500 mg by mouth 2 (two) times daily.   Mounjaro 12.5 MG/0.5ML Pen Generic drug: tirzepatide Inject 12.5 mg into the skin every Tuesday.   oxyCODONE 5 MG immediate release tablet Commonly known as: Oxy IR/ROXICODONE Take 1  tablet (5 mg total) by mouth every 4 (four) hours as needed for moderate pain (pain score 4-6).   potassium chloride 10 MEQ tablet Commonly known as: KLOR-CON Take 10 mEq by mouth daily.   Repatha SureClick 140 MG/ML Soaj Generic drug: Evolocumab Inject 140 mg into the skin every 14 (fourteen) days.   sotalol 80 MG tablet Commonly known as: BETAPACE Take 1 tablet (80 mg total) by mouth every 12 (twelve) hours.   Trelegy Ellipta 100-62.5-25 MCG/ACT Aepb Generic drug: Fluticasone-Umeclidin-Vilant Inhale 1 Dose into the lungs daily.   valACYclovir 1000 MG tablet Commonly known as: VALTREX Take 1,000 mg by mouth daily.   valsartan 320 MG tablet Commonly known as: DIOVAN Take 320 mg by mouth daily.        Follow-up Information     Loreli Slot, MD Follow up on 09/12/2023.   Specialty: Cardiothoracic Surgery Why: Appointment is at 4:00, please get CXR 1 hour prior to your appointment with Dr. Sunday Corn office Contact information: [address] [place] [address] [phone]         [facility] Follow up on 09/12/2023.   Why: Please get CXR at 3:00, prior to your appointment iwth Dr. Sunday Corn office Contact information: [address] [place] [address]                Signed: Lowella Dandy, PA-C  08/31/2023, 10:07 AM

## 2023-08-31 ENCOUNTER — Inpatient Hospital Stay (HOSPITAL_COMMUNITY): Payer: Medicare Other

## 2023-08-31 LAB — CBC
HCT: 32.7 % — ABNORMAL LOW (ref 36.0–46.0)
Hemoglobin: 10.4 g/dL — ABNORMAL LOW (ref 12.0–15.0)
MCH: 27.8 pg (ref 26.0–34.0)
MCHC: 31.8 g/dL (ref 30.0–36.0)
MCV: 87.4 fL (ref 80.0–100.0)
Platelets: 237 10*3/uL (ref 150–400)
RBC: 3.74 MIL/uL — ABNORMAL LOW (ref 3.87–5.11)
RDW: 14.6 % (ref 11.5–15.5)
WBC: 14.2 10*3/uL — ABNORMAL HIGH (ref 4.0–10.5)
nRBC: 0 % (ref 0.0–0.2)

## 2023-08-31 LAB — COMPREHENSIVE METABOLIC PANEL
ALT: 10 U/L (ref 0–44)
AST: 17 U/L (ref 15–41)
Albumin: 2.7 g/dL — ABNORMAL LOW (ref 3.5–5.0)
Alkaline Phosphatase: 37 U/L — ABNORMAL LOW (ref 38–126)
Anion gap: 7 (ref 5–15)
BUN: 20 mg/dL (ref 8–23)
CO2: 23 mmol/L (ref 22–32)
Calcium: 8.9 mg/dL (ref 8.9–10.3)
Chloride: 106 mmol/L (ref 98–111)
Creatinine, Ser: 0.77 mg/dL (ref 0.44–1.00)
GFR, Estimated: >60 mL/min (ref >60)
Glucose, Bld: 110 mg/dL — ABNORMAL HIGH (ref 70–99)
Potassium: 3.8 mmol/L (ref 3.5–5.1)
Sodium: 136 mmol/L (ref 135–145)
Total Bilirubin: 0.7 mg/dL (ref 0.0–1.2)
Total Protein: 5.5 g/dL — ABNORMAL LOW (ref 6.5–8.1)

## 2023-08-31 LAB — SURGICAL PATHOLOGY

## 2023-08-31 LAB — GLUCOSE, CAPILLARY: Glucose-Capillary: 95 mg/dL (ref 70–99)

## 2023-08-31 MED ORDER — GABAPENTIN 300 MG PO CAPS: 300.0000 mg | ORAL_CAPSULE | Freq: Two times a day (BID) | ORAL | 1 refills | Status: DC

## 2023-08-31 MED ORDER — ACETAMINOPHEN 500 MG PO TABS
500.0000 mg | ORAL_TABLET | Freq: Four times a day (QID) | ORAL | Status: AC | PRN
Start: 2023-08-31 — End: ?

## 2023-08-31 MED ORDER — OXYCODONE HCL 5 MG PO TABS: 5.0000 mg | ORAL_TABLET | ORAL | 0 refills | Status: DC | PRN

## 2023-08-31 NOTE — TOC Transition Note (Signed)
Transition of Care [facility]) - Discharge Note   Patient Details  Name: Tammy [name] MRN: [id number] Date of Birth: [birth year]  Transition of [facility]) CM/SW Contact:  Harriet Masson, RN Phone Number: 08/31/2023, 11:56 AM   Clinical Narrative:    Patient stable to discharge home.  No TOC needs at this time.    Final next level of care: Home/Self Care Barriers to Discharge: Barriers Resolved   Patient Goals and CMS Choice Patient states their goals for this hospitalization and ongoing recovery are:: return home          Discharge Placement             home          Discharge Plan and Services Additional resources added to the After Visit Summary for                                       Social Drivers of Health (SDOH) Interventions SDOH Screenings   Food Insecurity: No Food Insecurity (08/29/2023)  Housing: Low Risk  (08/29/2023)  Transportation Needs: No Transportation Needs (08/29/2023)  Utilities: Not At Risk (08/29/2023)  Social Connections: Socially Isolated (08/29/2023)  Tobacco Use: Medium Risk (08/29/2023)     Readmission Risk Interventions     No data to display

## 2023-08-31 NOTE — Progress Notes (Signed)
Went over AVS paperwork with patient, answered any/all questions, removed IV, redressed chest tube incision, gathered patient belongings, and patient was wheeled down to the discharge lounge.

## 2023-08-31 NOTE — Care Management Important Message (Signed)
Important Message  Patient Details  Name: Tammy [name] MRN: [id number] Date of Birth: 12/07/1950   Important Message Given:  Yes - Medicare IM  Patient left prior to IM delivery will mail a copy to the patient home address.   Celestino Ackerman 08/31/2023, 1:30 PM

## 2023-08-31 NOTE — Progress Notes (Signed)
      [address]       [name] [address]             [phone]      2 Days Post-Op Procedure(s) (LRB): XI ROBOTIC ASSISTED THORACOSCOPY-LEFT LOWER LOBECTOMY (Left) LYMPH NODE DISSECTION (Left) INTERCOSTAL NERVE BLOCK (Left)  Subjective:  Patient looks great.  She is feeling really good and feels up to going home today.  Objective: Vital signs in last 24 hours: Temp:  [97.6 F (36.4 C)-98.2 F (36.8 C)] 97.8 F (36.6 C) (02/14 0710) Pulse Rate:  [72-82] 72 (02/14 0710) Cardiac Rhythm: Normal sinus rhythm (02/14 0710) Resp:  [15-20] 15 (02/14 0710) BP: (108-128)/(60-74) 108/69 (02/14 0710) SpO2:  [90 %-96 %] 90 % (02/14 0710)  Intake/Output from previous day: 02/13 0701 - 02/14 0700 In: 600 [P.O.:600] Out: 540 [Urine:500; Chest Tube:40]  General appearance: alert, cooperative, and no distress Heart: regular rate and rhythm Lungs: clear to auscultation bilaterally Abdomen: soft, non-tender; bowel sounds normal; no masses,  no organomegaly Extremities: extremities normal, atraumatic, no cyanosis or edema Wound: clean and dry  Lab Results: Recent Labs    08/30/23 0405 08/31/23 0222  WBC 11.9* 14.2*  HGB 11.2* 10.4*  HCT 35.0* 32.7*  PLT 237 237   BMET:  Recent Labs    08/30/23 0405 08/31/23 0222  NA 136 136  K 4.3 3.8  CL 104 106  CO2 26 23  GLUCOSE 133* 110*  BUN 15 20  CREATININE 0.96 0.77  CALCIUM 9.2 8.9    PT/INR: No results for input(s): "LABPROT", "INR" in the last 72 hours. ABG    Component Value Date/Time   TCO2 29 04/30/2008 1126   CBG (last 3)  Recent Labs    08/30/23 1514 08/30/23 2104 08/31/23 0610  GLUCAP 124* 123* 95    Assessment/Plan: S/P Procedure(s) (LRB): XI ROBOTIC ASSISTED THORACOSCOPY-LEFT LOWER LOBECTOMY (Left) LYMPH NODE DISSECTION (Left) INTERCOSTAL NERVE BLOCK (Left)  CV- H/O A. Fib, HTN- continue Cardizem, Avapro, Imdur, Sotalol.. will resume Eliquis today Pulm- CT removed yesterday,  CXR with stable tiny apical space,  mild sub q emphysema Renal- creatinine has remained stable DM- continue Metformin, resume Monjaro at discharge Dispo- patient doing very well, will d/c home today   LOS: 2 days   Lowella Dandy, PA-C 08/31/2023

## 2023-09-06 ENCOUNTER — Other Ambulatory Visit: Payer: Self-pay

## 2023-09-08 NOTE — Progress Notes (Signed)
 The proposed treatment discussed in conference is for discussion purpose only and is not a binding recommendation.  The patients have not been physically examined, or presented with their treatment options.  Therefore, final treatment plans cannot be decided.

## 2023-09-11 ENCOUNTER — Other Ambulatory Visit: Payer: Self-pay | Admitting: Thoracic Surgery (Cardiothoracic Vascular Surgery)

## 2023-09-11 DIAGNOSIS — C3490 Malignant neoplasm of unspecified part of unspecified bronchus or lung: Secondary | ICD-10-CM

## 2023-09-11 DIAGNOSIS — M170 Bilateral primary osteoarthritis of knee: Secondary | ICD-10-CM | POA: Diagnosis not present

## 2023-09-12 ENCOUNTER — Ambulatory Visit
Admission: RE | Admit: 2023-09-12 | Discharge: 2023-09-12 | Disposition: A | Discharge status: Home / Self Care | Payer: Medicare Other | Source: Ambulatory Visit | Attending: Thoracic Surgery (Cardiothoracic Vascular Surgery) | Admitting: Thoracic Surgery (Cardiothoracic Vascular Surgery)

## 2023-09-12 ENCOUNTER — Ambulatory Visit (INDEPENDENT_AMBULATORY_CARE_PROVIDER_SITE_OTHER): Payer: Self-pay | Admitting: Thoracic Surgery (Cardiothoracic Vascular Surgery)

## 2023-09-12 VITALS — BP 95/62 | HR 98 | Resp 18 | Ht 60.0 in | Wt 171.0 lb

## 2023-09-12 DIAGNOSIS — R0602 Shortness of breath: Secondary | ICD-10-CM | POA: Diagnosis not present

## 2023-09-12 DIAGNOSIS — J918 Pleural effusion in other conditions classified elsewhere: Secondary | ICD-10-CM | POA: Diagnosis not present

## 2023-09-12 DIAGNOSIS — Z902 Acquired absence of lung [part of]: Secondary | ICD-10-CM

## 2023-09-12 DIAGNOSIS — C3490 Malignant neoplasm of unspecified part of unspecified bronchus or lung: Secondary | ICD-10-CM

## 2023-09-12 NOTE — Progress Notes (Signed)
 [address]       Tammy [name] [address]             [phone]      HPI: Tammy [name] returns for scheduled follow-up visit after her recent robotic left lower lobectomy.  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  Found to have a groundglass opacity on a low-dose CT for lung cancer screening about 3 years ago.  PET in 2023 showed no significant activity.  Biopsy was nondiagnostic.  Over time increased in size.  Dr. Jayme Cloud did a navigational bronchoscopy recently and biopsy showed mucinous adenocarcinoma.  I did a robotic assisted left lower lobectomy on 08/29/2023.  Her postoperative course was uncomplicated and she went home on day 2.  Final pathology showed a T1b, N0, stage Ia adenocarcinoma.  She has been feeling well.  She has a little bit of pain but has not taken any oxycodone since discharge.  She has been taking gabapentin twice a day.  She had a fall earlier today, but did not have any significant injury.  Past Medical History:  Diagnosis Date   Aortic atherosclerosis (HCC)    Atrial fibrillation (HCC)    a.) CHA2DS2VASc = 5 (age, CHF, HTN, vascular disease history, T2DM);  b.) rate/rhythm maintained on oral diltiazem + sotolol; chronically anticoagulated with apixaban   CAD (coronary artery disease)    a.) cCTA 08/21/2014: Ca2+ = 873 (99th %ile); b.) cCTA 04/10/2023: Ca2+ = 733.7 (93rd %ile; 50-60% mRCA, 50% pLAD, 60% mLCx); c.) LHC 04/24/2023: 45% p-mLAD - med mgmt   CHF (congestive heart failure) (HCC)    a.) TTE 06/17/2020: EF 50-55%, LV dil, mild LVH, mild RVE, mod BAE, G1DD; b.) TTE 12/19/2022: EF >55%, mod LAE, triv TR/PR, G1DD   Cholelithiasis     Chronic calcific pancreatitis (HCC)    Complication of anesthesia    COPD (chronic obstructive pulmonary disease) (HCC)    DDD (degenerative disc disease), lumbar    a.) s/p L3-L5 fusion   DJD (degenerative joint disease)    Encephalitis    Fibromyalgia    Hepatic steatosis    Hiatal hernia    History of kidney stones    HTN (hypertension)    Hyperlipidemia    Lung nodule 10/26/2021   a.) LDCT 10/26/21: 10.5 mm macrolob/slightly spiculated LLL; b.) PET CT 11/09/21: not FCG avid (? 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; f.) CT chest 07/05/23: 1.4x3.0 LLL nod with 1.7cm solid comp   OAB (overactive bladder)    a.) on vibegron + fesoterodine   On apixaban therapy    OSA on CPAP    PONV (postoperative nausea and vomiting)    T2DM (type 2 diabetes mellitus) (HCC)     Current Outpatient Medications  Medication Sig Dispense Refill   acetaminophen (TYLENOL) 500 MG tablet Take 1-2 tablets (500-1,000 mg total) by mouth every 6 (six) hours as needed.     albuterol (VENTOLIN HFA) 108 (90 Base) MCG/ACT inhaler Inhale 2 puffs into the lungs every 6 (six) hours as needed. 8 g 2  apixaban (ELIQUIS) 5 MG TABS tablet Take 5 mg by mouth 2 (two) times daily.     diltiazem (CARDIZEM CD) 240 MG 24 hr capsule Take 1 capsule (240 mg total) by mouth daily. 90 capsule 0   DULoxetine (CYMBALTA) 60 MG capsule Take 60 mg by mouth daily.     EPINEPHrine 0.3 mg/0.3 mL IJ SOAJ injection Inject 0.3 mg into the muscle as needed for anaphylaxis.     Evolocumab (REPATHA SURECLICK) 140 MG/ML SOAJ Inject 140 mg into the skin every 14 (fourteen) days.     fesoterodine (TOVIAZ) 4 MG TB24 tablet Take 4 mg by mouth daily.     Fluticasone-Umeclidin-Vilant (TRELEGY ELLIPTA) 100-62.5-25 MCG/ACT AEPB Inhale 1 Dose into the lungs  daily. 180 each 3   furosemide (LASIX) 20 MG tablet Take 20 mg by mouth daily.     gabapentin (NEURONTIN) 300 MG capsule Take 1 capsule (300 mg total) by mouth 2 (two) times daily. 60 capsule 1   isosorbide mononitrate (IMDUR) 30 MG 24 hr tablet Take 30 mg by mouth 2 (two) times daily.     metFORMIN (GLUCOPHAGE) 500 MG tablet Take 500 mg by mouth 2 (two) times daily.     oxyCODONE (OXY IR/ROXICODONE) 5 MG immediate release tablet Take 1 tablet (5 mg total) by mouth every 4 (four) hours as needed for moderate pain (pain score 4-6). 30 tablet 0   potassium chloride (KLOR-CON) 10 MEQ tablet Take 10 mEq by mouth daily.     sotalol (BETAPACE) 80 MG tablet Take 1 tablet (80 mg total) by mouth every 12 (twelve) hours. 60 tablet 0   tirzepatide (MOUNJARO) 12.5 MG/0.5ML Pen Inject 12.5 mg into the skin every Tuesday.     valACYclovir (VALTREX) 1000 MG tablet Take 1,000 mg by mouth daily.     valsartan (DIOVAN) 320 MG tablet Take 320 mg by mouth daily.     Vibegron (GEMTESA) 75 MG TABS Take 1 tablet by mouth daily.     No current facility-administered medications for this visit.   Facility-Administered Medications Ordered in Other Visits  Medication Dose Route Frequency Provider Last Rate Last Admin   sodium chloride flush (NS) 0.9 % injection 3 mL  3 mL Intravenous Q12H Laurier Nancy, MD        Physical Exam BP 95/62   Pulse 98   Resp 18   Ht 5' (1.524 m)   Wt 171 lb (77.6 kg)   SpO2 97%   BMI 33.66 kg/m  73 year old woman in no acute distress Alert and oriented x 3 with no focal deficits Cardiac regular Lungs diminished at left base otherwise clear Incisions well-healed No peripheral edema  Diagnostic Tests: CHEST - 2 VIEW   COMPARISON:  August 31, 2023.   FINDINGS: The heart size and mediastinal contours are within normal limits. Right lung is clear. Small left pleural effusion is noted. The visualized skeletal structures are unremarkable.   IMPRESSION: Small left pleural  effusion.     Electronically Signed   By: Lupita Raider M.D.   On: 09/12/2023 16:03  I personally reviewed the chest x-ray images.  Postop changes from left lower lobectomy.  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  Stage Ia adenocarcinoma-status post left lower lobectomy.  Does not  need any adjuvant therapy.  Will need long-term follow-up so we will arrange for oncology consultation at [facility].  Status post left lower lobectomy-overall doing extremely well.  She went home in 2 days.  She has not taken any narcotics since discharge.  No respiratory issues.  Fall-concerned about her fall specially since she is on Eliquis.  Her blood pressure was very low initially but on repeat was 95/62.  She is on multiple blood pressure and rhythm medications.  Most of those she is already taken today.  She has an appointment with Dr. Clelia Croft tomorrow so I am not going to make any major changes.  I did recommend she hold her evening dose of Imdur tonight and her Lasix tomorrow.  Possible she had some atrial fibrillation although her rhythm is regular currently.  PE extraordinarily unlikely with no respiratory symptoms and on anticoagulation.  Gabapentin could be a contributing factor to her dizziness.  I recommended she cut back to just taking that at night rather than twice a day.  Advised her to take it easy for the rest of the day and not to drive to drive until her dizziness is resolved.  Plan: Follow-up with Dr. Clelia Croft as scheduled Follow-up with Dr. Jayme Cloud Referral to oncology at [facility].  I will plan to see her back in 6 weeks with a PA and lateral chest x-ray to check on her  progress.  Loreli Slot, MD [facility] [phone]

## 2023-09-13 DIAGNOSIS — I480 Paroxysmal atrial fibrillation: Secondary | ICD-10-CM | POA: Diagnosis not present

## 2023-09-13 DIAGNOSIS — E1149 Type 2 diabetes mellitus with other diabetic neurological complication: Secondary | ICD-10-CM | POA: Diagnosis not present

## 2023-09-13 DIAGNOSIS — J439 Emphysema, unspecified: Secondary | ICD-10-CM | POA: Diagnosis not present

## 2023-09-13 DIAGNOSIS — C3492 Malignant neoplasm of unspecified part of left bronchus or lung: Secondary | ICD-10-CM | POA: Diagnosis not present

## 2023-09-13 DIAGNOSIS — D6869 Other thrombophilia: Secondary | ICD-10-CM | POA: Diagnosis not present

## 2023-09-13 DIAGNOSIS — I10 Essential (primary) hypertension: Secondary | ICD-10-CM | POA: Diagnosis not present

## 2023-09-28 ENCOUNTER — Encounter: Payer: Self-pay | Admitting: *Deleted

## 2023-09-28 ENCOUNTER — Inpatient Hospital Stay: Attending: Internal Medicine | Admitting: Internal Medicine

## 2023-09-28 ENCOUNTER — Inpatient Hospital Stay

## 2023-09-28 ENCOUNTER — Encounter: Payer: Self-pay | Admitting: Internal Medicine

## 2023-09-28 VITALS — BP 95/64 | HR 84 | Temp 96.0°F | Resp 18 | Ht 60.0 in | Wt 160.0 lb

## 2023-09-28 DIAGNOSIS — E785 Hyperlipidemia, unspecified: Secondary | ICD-10-CM | POA: Insufficient documentation

## 2023-09-28 DIAGNOSIS — Z808 Family history of malignant neoplasm of other organs or systems: Secondary | ICD-10-CM | POA: Insufficient documentation

## 2023-09-28 DIAGNOSIS — C3432 Malignant neoplasm of lower lobe, left bronchus or lung: Secondary | ICD-10-CM

## 2023-09-28 DIAGNOSIS — Z888 Allergy status to other drugs, medicaments and biological substances status: Secondary | ICD-10-CM | POA: Insufficient documentation

## 2023-09-28 DIAGNOSIS — Z79899 Other long term (current) drug therapy: Secondary | ICD-10-CM | POA: Diagnosis not present

## 2023-09-28 DIAGNOSIS — M2550 Pain in unspecified joint: Secondary | ICD-10-CM | POA: Diagnosis not present

## 2023-09-28 DIAGNOSIS — G4733 Obstructive sleep apnea (adult) (pediatric): Secondary | ICD-10-CM | POA: Insufficient documentation

## 2023-09-28 DIAGNOSIS — Z902 Acquired absence of lung [part of]: Secondary | ICD-10-CM | POA: Insufficient documentation

## 2023-09-28 DIAGNOSIS — Z9049 Acquired absence of other specified parts of digestive tract: Secondary | ICD-10-CM | POA: Diagnosis not present

## 2023-09-28 DIAGNOSIS — I5033 Acute on chronic diastolic (congestive) heart failure: Secondary | ICD-10-CM | POA: Diagnosis not present

## 2023-09-28 DIAGNOSIS — Z8249 Family history of ischemic heart disease and other diseases of the circulatory system: Secondary | ICD-10-CM | POA: Insufficient documentation

## 2023-09-28 DIAGNOSIS — E119 Type 2 diabetes mellitus without complications: Secondary | ICD-10-CM | POA: Diagnosis not present

## 2023-09-28 DIAGNOSIS — Z7901 Long term (current) use of anticoagulants: Secondary | ICD-10-CM | POA: Insufficient documentation

## 2023-09-28 DIAGNOSIS — Z800 Family history of malignant neoplasm of digestive organs: Secondary | ICD-10-CM | POA: Insufficient documentation

## 2023-09-28 DIAGNOSIS — K8020 Calculus of gallbladder without cholecystitis without obstruction: Secondary | ICD-10-CM | POA: Insufficient documentation

## 2023-09-28 DIAGNOSIS — I700 Atherosclerosis of aorta: Secondary | ICD-10-CM | POA: Diagnosis not present

## 2023-09-28 DIAGNOSIS — Z833 Family history of diabetes mellitus: Secondary | ICD-10-CM | POA: Diagnosis not present

## 2023-09-28 DIAGNOSIS — I110 Hypertensive heart disease with heart failure: Secondary | ICD-10-CM | POA: Insufficient documentation

## 2023-09-28 DIAGNOSIS — Z803 Family history of malignant neoplasm of breast: Secondary | ICD-10-CM | POA: Insufficient documentation

## 2023-09-28 DIAGNOSIS — Z91041 Radiographic dye allergy status: Secondary | ICD-10-CM | POA: Insufficient documentation

## 2023-09-28 DIAGNOSIS — J9601 Acute respiratory failure with hypoxia: Secondary | ICD-10-CM | POA: Insufficient documentation

## 2023-09-28 DIAGNOSIS — I4891 Unspecified atrial fibrillation: Secondary | ICD-10-CM | POA: Diagnosis not present

## 2023-09-28 DIAGNOSIS — R42 Dizziness and giddiness: Secondary | ICD-10-CM | POA: Insufficient documentation

## 2023-09-28 DIAGNOSIS — I2510 Atherosclerotic heart disease of native coronary artery without angina pectoris: Secondary | ICD-10-CM | POA: Insufficient documentation

## 2023-09-28 DIAGNOSIS — Z87891 Personal history of nicotine dependence: Secondary | ICD-10-CM | POA: Insufficient documentation

## 2023-09-28 DIAGNOSIS — Z87442 Personal history of urinary calculi: Secondary | ICD-10-CM | POA: Diagnosis not present

## 2023-09-28 NOTE — Progress Notes (Signed)
 [facility] CONSULT NOTE  Patient Care Team: Cleatis Polka., MD as PCP - General (Internal Medicine) Rollene Rotunda, MD as PCP - Cardiology (Cardiology) Glory Buff, RN as Oncology Nurse Navigator Salena Saner, MD as Consulting Physician (Pulmonary Disease) Earna Coder, MD as Consulting Physician (Oncology)  CHIEF COMPLAINTS/PURPOSE OF CONSULTATION: lung nodule/mass  #  Oncology History Overview Note  A. LEFT LUNG, LOWER LOBE, MASS, LOBECTOMY: Invasive well-differentiated adenocarcinoma, enteric/mucinous type Tumor measures 1.5 cm in greatest dimension (pT1b) Bronchial and vascular margin free Three benign lymph nodes, negative carcinoma (0/3) Emphysematous changes within nonneoplastic lung with subpleural scar  B. LYMPH NODE, LEVEL 9, EXCISION: One benign lymph node, negative carcinoma (0/1)  C. LYMPH NODE, LEVEL 9 #2, EXCISION: One benign lymph node, negative carcinoma (0/1)  D. LYMPH NODE, LEVEL 8, EXCISION: One benign lymph node, negative carcinoma (0/1)  E. LYMPH NODE, LEVEL 10, EXCISION: One benign lymph node, negative carcinoma (0/1)  F. LYMPH NODE, LEVEL 7, EXCISION: One benign lymph node, negative carcinoma (0/1)  G. LYMPH NODE, LEVEL 12, EXCISION: One benign lymph node, negative carcinoma (0/1)  H. LYMPH NODE, LEVEL 5, EXCISION: One benign lymph node, negative carcinoma (0/1)  I. LYMPH NODE, LEVEL 5 #2, EXCISION: One benign lymph node, negative carcinoma (0/1)  J. LYMPH NODE, LEVEL 12 #2, EXCISION: One benign lymph node, negative carcinoma (0/1)  K. LYMPH NODE, LEVEL 12 #3, EXCISION: One benign lymph node, negative carcinoma (0/1)  L. LYMPH NODE, LEVEL 11, EXCISION: One benign lymph node, negative carcinoma (0/1)  M. LYMPH NODE, LEVEL 11 #2, EXCISION: One benign lymph node, negative carcinoma (0/1)  ONCOLOGY TABLE:  LUNG: Resection  Synchronous Tumors: Not applicable Total Number of Primary Tumors:  1 Procedure: Lobectomy with node sampling Specimen Laterality: Left Tumor Focality: Unifocal Tumor Site: Lower lobe Tumor Size: 1.5 cm in greatest dimension Histologic Type: Adenocarcinoma, enteric/mucinous type Visceral Pleura Invasion: Not identified Direct Invasion of Adjacent Structures: No adjacent structures present Lymphovascular Invasion: Not identified Margins: All margins negative for invasive carcinoma      Closest Margin(s) to Invasive Carcinoma: 4 cm from bronchial margin Treatment Effect: No known presurgical therapy Regional Lymph Nodes:      Number of Lymph Nodes Involved: 0      Number of Lymph Nodes Examined: 15                      Nodal Sites Examined: Stations 5, 7, 8, 9, 10L, 11L and 12L Distant Metastasis: Not applicable Pathologic Stage Classification (pTNM, AJCC 8th Edition): pT1b, pN0 Ancillary Studies: Available upon request. 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 Indolent bronchogenic neoplasm remains a differential consideration. Three-month follow-up chest CT may be helpful for further assessment. Referral to multi disciplinary thoracic oncologic setting if not yet performed is suggested. Aortic atherosclerosis and coronary artery disease.  Cholelithiasis.  Abundant stool in the colon, query constipation.     Electronically Signed   By: Donzetta Kohut M.D.   On: 11/09/2021  13:40  #  FEB 2023- A-fib with RVR and acute decompensation of diastolic CHF and associated acute hypoxic respiratory  failure.  HISTORY OF PRESENTING ILLNESS: Patient ambulating-  independently .  Accompanied by family.   Tammy [name] 73 y.o.  female history of smoking-currently quit-A-fib history of CHF-she will continue follow-up after her surgery for left lower lobe lung nodule.  Patient's left lower lobe lung nodule was initially noted in 2023 for lung cancer screening CT scan.  This was followed over time and finally patient underwent surgery/lobectomy in February 2025.  Patient denies any complications postsurgery.  However she complains of dizzy spells over the last few weeks.  This was initially attributed to gabapentin.  She is currently off gabapentin.  However she had another episode of dizzy spell today.   Patient denies any worsening shortness of breath or cough.  Denies any worsening swelling in the legs.  Denies any wheezing.  Denies any hemoptysis.   Review of Systems  Constitutional:  Negative for chills, diaphoresis, fever, malaise/fatigue and weight loss.  HENT:  Negative for nosebleeds and sore throat.   Eyes:  Negative for double vision.  Respiratory:  Negative for cough, hemoptysis, sputum production, shortness of breath and wheezing.   Cardiovascular:  Negative for chest pain, palpitations, orthopnea and leg swelling.  Gastrointestinal:  Negative for abdominal pain, blood in stool, constipation, diarrhea, heartburn, melena, nausea and vomiting.  Genitourinary:  Negative for dysuria, frequency and urgency.  Musculoskeletal:  Positive for joint pain. Negative for back pain.  Skin: Negative.  Negative for itching and rash.  Neurological:  Positive for dizziness. Negative for tingling, focal weakness, weakness and headaches.  Endo/Heme/Allergies:  Does not bruise/bleed easily.  Psychiatric/Behavioral:  Negative for depression. The patient is not nervous/anxious and does not have insomnia.      MEDICAL HISTORY:  Past Medical History:  Diagnosis Date   Aortic  atherosclerosis (HCC)    Atrial fibrillation (HCC)    a.) CHA2DS2VASc = 5 (age, CHF, HTN, vascular disease history, T2DM);  b.) rate/rhythm maintained on oral diltiazem + sotolol; chronically anticoagulated with apixaban   CAD (coronary artery disease)    a.) cCTA 08/21/2014: Ca2+ = 873 (99th %ile); b.) cCTA 04/10/2023: Ca2+ = 733.7 (93rd %ile; 50-60% mRCA, 50% pLAD, 60% mLCx); c.) LHC 04/24/2023: 45% p-mLAD - med mgmt   CHF (congestive heart failure) (HCC)    a.) TTE 06/17/2020: EF 50-55%, LV dil, mild LVH, mild RVE, mod BAE, G1DD; b.) TTE 12/19/2022: EF >55%, mod LAE, triv TR/PR, G1DD   Cholelithiasis    Chronic calcific pancreatitis (HCC)    Complication of anesthesia    COPD (chronic obstructive pulmonary disease) (HCC)    DDD (degenerative disc disease), lumbar    a.) s/p L3-L5 fusion   DJD (degenerative joint disease)    Encephalitis    Fibromyalgia    Hepatic steatosis    Hiatal hernia    History of kidney stones    HTN (hypertension)    Hyperlipidemia    Lung nodule 10/26/2021   a.) LDCT 10/26/21: 10.5 mm macrolob/slightly spiculated LLL; b.) PET CT 11/09/21: not FCG avid (? 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; f.) CT chest 07/05/23: 1.4x3.0 LLL nod with 1.7cm solid comp   OAB (overactive bladder)    a.) on vibegron +  fesoterodine   On apixaban therapy    OSA on CPAP    PONV (postoperative nausea and vomiting)    T2DM (type 2 diabetes mellitus) (HCC)     SURGICAL HISTORY: Past Surgical History:  Procedure Laterality Date   APPENDECTOMY     BACK SURGERY  2022   BREAST CYST EXCISION     BREAST EXCISIONAL BIOPSY Left 2004   COLONOSCOPY WITH PROPOFOL N/A 11/26/2020   Procedure: COLONOSCOPY WITH PROPOFOL;  Surgeon: Jeani Hawking, MD;  Location: [facility];  Service:  Endoscopy;  Laterality: N/A;   COLONOSCOPY WITH PROPOFOL N/A 02/16/2023   Procedure: COLONOSCOPY WITH PROPOFOL;  Surgeon: Jeani Hawking, MD;  Location: [facility];  Service: Gastroenterology;  Laterality: N/A;   DIAGNOSTIC LAPAROSCOPY     HEMOSTASIS CLIP PLACEMENT  11/26/2020   Procedure: HEMOSTASIS CLIP PLACEMENT;  Surgeon: Jeani Hawking, MD;  Location: [facility];  Service: Endoscopy;;   INTERCOSTAL NERVE BLOCK Left 08/29/2023   Procedure: INTERCOSTAL NERVE BLOCK;  Surgeon: Loreli Slot, MD;  Location: [facility] OR;  Service: Thoracic;  Laterality: Left;   KNEE ARTHROSCOPY     LEFT HEART CATH AND CORONARY ANGIOGRAPHY N/A 04/24/2023   Procedure: LEFT HEART CATH AND CORONARY ANGIOGRAPHY;  Surgeon: Laurier Nancy, MD;  Location: [facility];  Service: Cardiovascular;  Laterality: N/A;   LYMPH NODE DISSECTION Left 08/29/2023   Procedure: LYMPH NODE DISSECTION;  Surgeon: Loreli Slot, MD;  Location: [facility] OR;  Service: Thoracic;  Laterality: Left;   POLYPECTOMY  11/26/2020   Procedure: POLYPECTOMY;  Surgeon: Jeani Hawking, MD;  Location: [facility];  Service: Endoscopy;;   POLYPECTOMY  02/16/2023   Procedure: POLYPECTOMY;  Surgeon: Jeani Hawking, MD;  Location: [facility];  Service: Gastroenterology;;   SUBMUCOSAL TATTOO INJECTION  11/26/2020   Procedure: SUBMUCOSAL TATTOO INJECTION;  Surgeon: Jeani Hawking, MD;  Location: [facility];  Service: Endoscopy;;   TEE WITHOUT CARDIOVERSION N/A 06/29/2020   Procedure: TRANSESOPHAGEAL ECHOCARDIOGRAM (TEE) with DCCV;  Surgeon: Laurier Nancy, MD;  Location: [facility];  Service: Cardiovascular;  Laterality: N/A;   TOTAL KNEE ARTHROPLASTY Left 05/18/2023   Procedure: LEFT TOTAL KNEE ARTHROPLASTY;  Surgeon: Kathryne Hitch, MD;  Location: [facility];  Service: Orthopedics;  Laterality: Left;    SOCIAL HISTORY: Social History   Socioeconomic History   Marital status: Married    Spouse name: Armed forces logistics/support/administrative officer   Number of children: 0    Years of education: Not on file   Highest education level: Not on file  Occupational History   Not on file  Tobacco Use   Smoking status: Former    Current packs/day: 0.00    Average packs/day: 1 pack/day for 30.0 years (30.0 ttl pk-yrs)    Types: Cigarettes    Start date: 11/02/1978    Quit date: 11/01/2008    Years since quitting: 14.9   Smokeless tobacco: Never  Vaping Use   Vaping status: Never Used  Substance and Sexual Activity   Alcohol use: No    Alcohol/week: 0.0 standard drinks of alcohol   Drug use: No   Sexual activity: Not on file  Other Topics Concern   Not on file  Social History Narrative   Lives with husband in [place]. quit smoking in 2021; smoked 40 years; no alcohol. Retd- office work/printing. No children.     Social Drivers of Corporate investment banker Strain: Not on file  Food Insecurity: No Food Insecurity (09/28/2023)   Hunger Vital Sign    Worried About  Running Out of Food in the Last Year: Never true    Ran Out of Food in the Last Year: Never true  Transportation Needs: No Transportation Needs (09/28/2023)   PRAPARE - Administrator, Civil Service (Medical): No    Lack of Transportation (Non-Medical): No  Physical Activity: Not on file  Stress: Not on file  Social Connections: Socially Isolated (08/29/2023)   Social Connection and Isolation Panel [NHANES]    Frequency of Communication with Friends and Family: Once a week    Frequency of Social Gatherings with Friends and Family: Once a week    Attends Religious Services: Never    Database administrator or Organizations: No    Attends Banker Meetings: Never    Marital Status: Married  Catering manager Violence: Not At Risk (09/28/2023)   Humiliation, Afraid, Rape, and Kick questionnaire    Fear of Current or Ex-Partner: No    Emotionally Abused: No    Physically Abused: No    Sexually Abused: No    FAMILY HISTORY: Family History  Problem Relation Age of Onset   CAD  Mother 43   Diabetes Father    Heart disease Father    CAD Brother 38   Throat cancer Paternal Uncle    Stomach cancer Maternal Grandfather    Breast cancer Cousin     ALLERGIES:  is allergic to betadine [povidone iodine], contrast media [iodinated contrast media], iodine, metrizamide, povidone-iodine, and shellfish allergy.  MEDICATIONS:  Current Outpatient Medications  Medication Sig Dispense Refill   albuterol (VENTOLIN HFA) 108 (90 Base) MCG/ACT inhaler Inhale 2 puffs into the lungs every 6 (six) hours as needed. 8 g 2   apixaban (ELIQUIS) 5 MG TABS tablet Take 5 mg by mouth 2 (two) times daily.     diltiazem (CARDIZEM CD) 240 MG 24 hr capsule Take 1 capsule (240 mg total) by mouth daily. 90 capsule 0   DULoxetine (CYMBALTA) 60 MG capsule Take 60 mg by mouth daily.     EPINEPHrine 0.3 mg/0.3 mL IJ SOAJ injection Inject 0.3 mg into the muscle as needed for anaphylaxis.     Evolocumab (REPATHA SURECLICK) 140 MG/ML SOAJ Inject 140 mg into the skin every 14 (fourteen) days.     fesoterodine (TOVIAZ) 4 MG TB24 tablet Take 4 mg by mouth daily.     Fluticasone-Umeclidin-Vilant (TRELEGY ELLIPTA) 100-62.5-25 MCG/ACT AEPB Inhale 1 Dose into the lungs daily. 180 each 3   furosemide (LASIX) 20 MG tablet Take 20 mg by mouth daily.     gabapentin (NEURONTIN) 300 MG capsule Take 1 capsule (300 mg total) by mouth 2 (two) times daily. 60 capsule 1   isosorbide mononitrate (IMDUR) 30 MG 24 hr tablet Take 30 mg by mouth 2 (two) times daily.     metFORMIN (GLUCOPHAGE) 500 MG tablet Take 500 mg by mouth 2 (two) times daily.     potassium chloride (KLOR-CON) 10 MEQ tablet Take 10 mEq by mouth daily.     sotalol (BETAPACE) 80 MG tablet Take 1 tablet (80 mg total) by mouth every 12 (twelve) hours. 60 tablet 0   tirzepatide (MOUNJARO) 12.5 MG/0.5ML Pen Inject 12.5 mg into the skin every Tuesday.     valACYclovir (VALTREX) 1000 MG tablet Take 1,000 mg by mouth daily.     valsartan (DIOVAN) 320 MG tablet Take  160 mg by mouth daily.     Vibegron (GEMTESA) 75 MG TABS Take 1 tablet by mouth daily.  acetaminophen (TYLENOL) 500 MG tablet Take 1-2 tablets (500-1,000 mg total) by mouth every 6 (six) hours as needed. (Patient not taking: Reported on 09/28/2023)     oxyCODONE (OXY IR/ROXICODONE) 5 MG immediate release tablet Take 1 tablet (5 mg total) by mouth every 4 (four) hours as needed for moderate pain (pain score 4-6). (Patient not taking: Reported on 09/28/2023) 30 tablet 0   No current facility-administered medications for this visit.   Facility-Administered Medications Ordered in Other Visits  Medication Dose Route Frequency Provider Last Rate Last Admin   sodium chloride flush (NS) 0.9 % injection 3 mL  3 mL Intravenous Q12H Adrian Blackwater A, MD          .  PHYSICAL EXAMINATION: ECOG PERFORMANCE STATUS: 0 - Asymptomatic  Vitals:   09/28/23 1359  BP: 95/64  Pulse: 84  Resp: 18  Temp: (!) 96 F (35.6 C)  SpO2: 98%    Filed Weights   09/28/23 1359  Weight: 160 lb (72.6 kg)     Physical Exam Vitals and nursing note reviewed.  HENT:     Head: Normocephalic and atraumatic.     Mouth/Throat:     Pharynx: Oropharynx is clear.  Eyes:     Extraocular Movements: Extraocular movements intact.     Pupils: Pupils are equal, round, and reactive to light.  Cardiovascular:     Rate and Rhythm: Normal rate and regular rhythm.  Pulmonary:     Comments: Decreased breath sounds bilaterally.  Abdominal:     Palpations: Abdomen is soft.  Musculoskeletal:        General: Normal range of motion.     Cervical back: Normal range of motion.  Skin:    General: Skin is warm.  Neurological:     General: No focal deficit present.     Mental Status: She is alert and oriented to person, place, and time.  Psychiatric:        Behavior: Behavior normal.        Judgment: Judgment normal.      LABORATORY DATA:  I have reviewed the data as listed Lab Results  Component Value Date   WBC 14.2  (H) 08/31/2023   HGB 10.4 (L) 08/31/2023   HCT 32.7 (L) 08/31/2023   MCV 87.4 08/31/2023   PLT 237 08/31/2023   Recent Labs    05/09/23 0916 05/19/23 0315 08/27/23 1429 08/30/23 0405 08/31/23 0222  NA 139   < > 138 136 136  K 4.0   < > 3.7 4.3 3.8  CL 102   < > 99 104 106  CO2 30   < > 25 26 23   GLUCOSE 103*   < > 97 133* 110*  BUN 15   < > 19 15 20   CREATININE 0.77   < > 0.87 0.96 0.77  CALCIUM 9.4   < > 10.4* 9.2 8.9  GFRNONAA >60   < > >60 >60 >60  PROT 7.1  --  7.5  --  5.5*  ALBUMIN 3.5  --  3.9  --  2.7*  AST 13*  --  17  --  17  ALT 12  --  15  --  10  ALKPHOS 58  --  55  --  37*  BILITOT 1.1  --  0.9  --  0.7   < > = values in this interval not displayed.    RADIOGRAPHIC STUDIES: I have personally reviewed the radiological images as listed and agreed with the findings  in the report. DG Chest 2 View Result Date: 09/12/2023 CLINICAL DATA:  Shortness of breath status post chest surgery 2 weeks ago. EXAM: CHEST - 2 VIEW COMPARISON:  August 31, 2023. FINDINGS: The heart size and mediastinal contours are within normal limits. Right lung is clear. Small left pleural effusion is noted. The visualized skeletal structures are unremarkable. IMPRESSION: Small left pleural effusion. Electronically Signed   By: Lupita Raider M.D.   On: 09/12/2023 16:03   DG Chest 2 View Addendum Date: 08/31/2023 ADDENDUM REPORT: 08/31/2023 09:37 ADDENDUM: Critical Value/emergent results were called by telephone at the time of interpretation on 08/31/2023 at 9:36 am to PA [name], who verbally acknowledged these results. Electronically Signed   By: Delbert Phenix M.D.   On: 08/31/2023 09:37   Result Date: 08/31/2023 CLINICAL DATA:  [id number] S/P lobectomy of lung [id number] EXAM: CHEST - 2 VIEW COMPARISON:  Chest radiograph from one day prior. FINDINGS: Stable cardiomediastinal silhouette with normal heart size. Small left apical and basilar pneumothorax, approximately 5%. No right pneumothorax. No  mediastinal shift. No pleural effusion. Clear right lung. Mild medial left lower lung atelectasis. Subcutaneous emphysema in the lateral left chest wall. IMPRESSION: 1. Small left apical and basilar pneumothorax, approximately 5%. No mediastinal shift. 2. Mild medial left lower lung atelectasis. Electronically Signed: By: Delbert Phenix M.D. On: 08/31/2023 09:31   DG Chest 1V REPEAT Same Day Result Date: 08/30/2023 CLINICAL DATA:  Chest tube removal EXAM: CHEST - 1 VIEW SAME DAY COMPARISON:  08/30/2023 FINDINGS: Low lung volumes. Stable postsurgical changes on the left. Stable cardiomediastinal silhouette with aortic atherosclerosis. Similar small left apical and CP angle pneumothorax compared to prior. IMPRESSION: Stable small left apical and CP angle pneumothorax compared to prior. Electronically Signed   By: Jasmine Pang M.D.   On: 08/30/2023 15:50   DG Chest Port 1 View Result Date: 08/30/2023 CLINICAL DATA:  [id number] S/P lobectomy of lung [id number] EXAM: PORTABLE CHEST 1 VIEW COMPARISON:  08/29/2023 FINDINGS: Stable positioning of left chest tube and left IJ CVC. Small left pneumothorax, stable to minimally decreased in size compared to the prior study. Stable cardiomediastinal contours. Aortic atherosclerosis. No new airspace consolidation. No right-sided pneumothorax. IMPRESSION: Small left pneumothorax, stable to minimally decreased in size compared to the prior study. Electronically Signed   By: Duanne Guess D.O.   On: 08/30/2023 10:25    ASSESSMENT & PLAN:   Cancer of lower lobe of left lung (HCC) A. LEFT LUNG, LOWER LOBE, MASS, LOBECTOMY:Invasive well-differentiated adenocarcinoma, enteric/mucinous type Tumor measures 1.5 cm in greatest dimension (pT1b) pN0-stage Ia lung cancer.  # I had a long discussion with the patient and her husband regarding the overall good prognosis of stage I lung cancer would not recommend any adjuvant therapy either chemo or radiation.  However would recommend  surveillance imaging CT scan every 6 to 12 months for the first 2 to 3 years followed by annual CT scan.   # Dizzyness- ? Gabapentin- improved however one episode today- on BP meds/lasix- reocmmend hold off lasix at home 3-4 days. Recheck BP- recommend call PCP.  Low clinical suspicion for any metastatic disease to the brain.  # A.fib/acute decompensation [feb 2023-Dr.Khan]- on Eliquis.  Clinically stable.  # COPD-stable.  I will send a message to pulmonary Dr.Gonzalez Haley-regarding follow-up imaging/surveillance scans.  Patient will follow-up with oncology only as needed.  Patient seems to comprehend the plan of care and is in agreement.  # 40 minutes face-to-face with the patient  discussing the above plan of care; more than 50% of time spent on prognosis/ natural history; counseling and coordination.   # DISPOSITION: # no labs today # follow up  Dr.B  # I reviewed the blood work- with the patient in detail; also reviewed the imaging independently [as summarized above]; and with the patient in detail.   Cc; Dr.Khan  All questions were answered. The patient knows to call the clinic with any problems, questions or concerns.    Earna Coder, MD 09/28/2023 11:19 PM

## 2023-09-28 NOTE — Progress Notes (Signed)
 Patient states she has been experiencing dizziness all day long. She's had 1 fall in the last 3 months. She is also wondering "if it is possible if cancer has came back in another part of her body."

## 2023-09-28 NOTE — Assessment & Plan Note (Addendum)
 A. LEFT LUNG, LOWER LOBE, MASS, LOBECTOMY:Invasive well-differentiated adenocarcinoma, enteric/mucinous type Tumor measures 1.5 cm in greatest dimension (pT1b) pN0-stage Ia lung cancer.  # I had a long discussion with the patient and her husband regarding the overall good prognosis of stage I lung cancer would not recommend any adjuvant therapy either chemo or radiation.  However would recommend surveillance imaging CT scan every 6 to 12 months for the first 2 to 3 years followed by annual CT scan.   # Dizzyness- ? Gabapentin- improved however one episode today- on BP meds/lasix- reocmmend hold off lasix at home 3-4 days. Recheck BP- recommend call PCP.  Low clinical suspicion for any metastatic disease to the brain.  # A.fib/acute decompensation [feb 2023-Dr.Khan]- on Eliquis.  Clinically stable.  # COPD-stable.  I will send a message to pulmonary Dr.Gonzalez Haley-regarding follow-up imaging/surveillance scans.  Patient will follow-up with oncology only as needed.  Patient seems to comprehend the plan of care and is in agreement.  # 40 minutes face-to-face with the patient discussing the above plan of care; more than 50% of time spent on prognosis/ natural history; counseling and coordination.   # DISPOSITION: # no labs today # follow up  Dr.B  # I reviewed the blood work- with the patient in detail; also reviewed the imaging independently [as summarized above]; and with the patient in detail.   Cc; Dr.Khan

## 2023-10-01 ENCOUNTER — Other Ambulatory Visit (INDEPENDENT_AMBULATORY_CARE_PROVIDER_SITE_OTHER): Payer: Self-pay

## 2023-10-01 ENCOUNTER — Encounter: Payer: Self-pay | Admitting: Orthopaedic Surgery

## 2023-10-01 ENCOUNTER — Ambulatory Visit (INDEPENDENT_AMBULATORY_CARE_PROVIDER_SITE_OTHER): Payer: Medicare Other | Admitting: Orthopaedic Surgery

## 2023-10-01 DIAGNOSIS — Z96652 Presence of left artificial knee joint: Secondary | ICD-10-CM

## 2023-10-01 NOTE — Progress Notes (Signed)
 The patient is now 4 months status post a left total knee arthroplasty.  He said the knee is doing well and she has just a little bit of soreness.  She did fall recently after taking nerve type of medication with as it related to her other surgery.  She is 72 years old and active.  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  She said the left knee has good range of motion and strength.  Her left knee shows no significant swelling.  The range of motion is full.  There is a slight rash just lateral to the incision I want her to try hydrocortisone cream on that area once daily.  Overall the knee feels stable.  2 views of the left knee today standing show well-seated total knee arthroplasty with no complicating features.  She will continue to increase her activities as comfort allows.  From my standpoint with her left knee, we will see her back in 6 months with repeat AP and lateral of her left knee.  If she does develop any worsening problems of the right knee and wants to be seen for the right knee she will let us know.

## 2023-10-11 DIAGNOSIS — M170 Bilateral primary osteoarthritis of knee: Secondary | ICD-10-CM | POA: Diagnosis not present

## 2023-10-22 ENCOUNTER — Encounter: Payer: Self-pay | Admitting: Cardiovascular Disease

## 2023-10-22 ENCOUNTER — Ambulatory Visit (INDEPENDENT_AMBULATORY_CARE_PROVIDER_SITE_OTHER): Payer: Medicare Other | Admitting: Cardiovascular Disease

## 2023-10-22 VITALS — BP 102/65 | HR 80 | Ht 60.0 in | Wt 159.0 lb

## 2023-10-22 DIAGNOSIS — I10 Essential (primary) hypertension: Secondary | ICD-10-CM

## 2023-10-22 DIAGNOSIS — R0602 Shortness of breath: Secondary | ICD-10-CM

## 2023-10-22 DIAGNOSIS — I4891 Unspecified atrial fibrillation: Secondary | ICD-10-CM

## 2023-10-22 DIAGNOSIS — I5031 Acute diastolic (congestive) heart failure: Secondary | ICD-10-CM | POA: Diagnosis not present

## 2023-10-22 DIAGNOSIS — I2510 Atherosclerotic heart disease of native coronary artery without angina pectoris: Secondary | ICD-10-CM | POA: Diagnosis not present

## 2023-10-22 DIAGNOSIS — E782 Mixed hyperlipidemia: Secondary | ICD-10-CM | POA: Diagnosis not present

## 2023-10-22 NOTE — Progress Notes (Signed)
 Cardiology Office Note   Date:  10/22/2023   ID:  Tammy [name], Tammy [name] 11-13-1950, MRN [id number]  PCP:  Cleatis Polka., MD  Cardiologist:  Adrian Blackwater, MD      History of Present Illness: Tammy [name] is a 73 y.o. female who presents for  Chief Complaint  Patient presents with   Follow-up    2 month follow up      Has allergy due to pollens.      Past Medical History:  Diagnosis Date   Aortic atherosclerosis (HCC)    Atrial fibrillation (HCC)    a.) CHA2DS2VASc = 5 (age, CHF, HTN, vascular disease history, T2DM);  b.) rate/rhythm maintained on oral diltiazem + sotolol; chronically anticoagulated with apixaban   CAD (coronary artery disease)    a.) cCTA 08/21/2014: Ca2+ = 873 (99th %ile); b.) cCTA 04/10/2023: Ca2+ = 733.7 (93rd %ile; 50-60% mRCA, 50% pLAD, 60% mLCx); c.) LHC 04/24/2023: 45% p-mLAD - med mgmt   CHF (congestive heart failure) (HCC)    a.) TTE 06/17/2020: EF 50-55%, LV dil, mild LVH, mild RVE, mod BAE, G1DD; b.) TTE 12/19/2022: EF >55%, mod LAE, triv TR/PR, G1DD   Cholelithiasis    Chronic calcific pancreatitis (HCC)    Complication of anesthesia    COPD (chronic obstructive pulmonary disease) (HCC)    DDD (degenerative disc disease), lumbar    a.) s/p L3-L5 fusion   DJD (degenerative joint disease)    Encephalitis    Fibromyalgia    Hepatic steatosis    Hiatal hernia    History of kidney stones    HTN (hypertension)    Hyperlipidemia    Lung nodule 10/26/2021   a.) LDCT 10/26/21: 10.5 mm macrolob/slightly spiculated LLL; b.) PET CT 11/09/21: not FCG avid (? 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; f.) CT chest 07/05/23: 1.4x3.0 LLL nod with 1.7cm solid comp   OAB (overactive bladder)    a.) on vibegron + fesoterodine   On apixaban  therapy    OSA on CPAP    PONV (postoperative nausea and vomiting)    T2DM (type 2 diabetes mellitus) (HCC)      Past Surgical History:  Procedure Laterality Date   APPENDECTOMY     BACK SURGERY  2022   BREAST CYST EXCISION     BREAST EXCISIONAL BIOPSY Left 2004   COLONOSCOPY WITH PROPOFOL N/A 11/26/2020   Procedure: COLONOSCOPY WITH PROPOFOL;  Surgeon: Jeani Hawking, MD;  Location: [facility];  Service: Endoscopy;  Laterality: N/A;   COLONOSCOPY WITH PROPOFOL N/A 02/16/2023   Procedure: COLONOSCOPY WITH PROPOFOL;  Surgeon: Jeani Hawking, MD;  Location: [facility];  Service: Gastroenterology;  Laterality: N/A;   DIAGNOSTIC LAPAROSCOPY     HEMOSTASIS CLIP PLACEMENT  11/26/2020   Procedure: HEMOSTASIS CLIP PLACEMENT;  Surgeon: Jeani Hawking, MD;  Location: [facility];  Service: Endoscopy;;   INTERCOSTAL NERVE BLOCK Left 08/29/2023   Procedure: INTERCOSTAL NERVE BLOCK;  Surgeon: Loreli Slot, MD;  [facility] OR;  Service: Thoracic;  Laterality: Left;   KNEE ARTHROSCOPY     LEFT HEART CATH AND CORONARY ANGIOGRAPHY N/A 04/24/2023   Procedure: LEFT HEART CATH AND CORONARY ANGIOGRAPHY;  Surgeon: Laurier Nancy, MD;  Location: [facility];  Service: Cardiovascular;  Laterality: N/A;   LYMPH NODE  DISSECTION Left 08/29/2023   Procedure: LYMPH NODE DISSECTION;  Surgeon: Loreli Slot, MD;  Location: [facility] OR;  Service: Thoracic;  Laterality: Left;   POLYPECTOMY  11/26/2020   Procedure: POLYPECTOMY;  Surgeon: Jeani Hawking, MD;  Location: [facility];  Service: Endoscopy;;   POLYPECTOMY  02/16/2023   Procedure: POLYPECTOMY;  Surgeon: Jeani Hawking, MD;  Location: [facility];  Service: Gastroenterology;;   SUBMUCOSAL TATTOO INJECTION  11/26/2020   Procedure: SUBMUCOSAL TATTOO INJECTION;  Surgeon: Jeani Hawking, MD;  Location: [facility];  Service: Endoscopy;;   TEE WITHOUT CARDIOVERSION N/A 06/29/2020   Procedure: TRANSESOPHAGEAL ECHOCARDIOGRAM (TEE) with DCCV;   Surgeon: Laurier Nancy, MD;  Location: [facility] ORS;  Service: Cardiovascular;  Laterality: N/A;   TOTAL KNEE ARTHROPLASTY Left 05/18/2023   Procedure: LEFT TOTAL KNEE ARTHROPLASTY;  Surgeon: Kathryne Hitch, MD;  Location: [facility];  Service: Orthopedics;  Laterality: Left;     Current Outpatient Medications  Medication Sig Dispense Refill   acetaminophen (TYLENOL) 500 MG tablet Take 1-2 tablets (500-1,000 mg total) by mouth every 6 (six) hours as needed. (Patient not taking: Reported on 09/28/2023)     albuterol (VENTOLIN HFA) 108 (90 Base) MCG/ACT inhaler Inhale 2 puffs into the lungs every 6 (six) hours as needed. 8 g 2   apixaban (ELIQUIS) 5 MG TABS tablet Take 5 mg by mouth 2 (two) times daily.     diltiazem (CARDIZEM CD) 240 MG 24 hr capsule Take 1 capsule (240 mg total) by mouth daily. 90 capsule 0   DULoxetine (CYMBALTA) 60 MG capsule Take 60 mg by mouth daily.     EPINEPHrine 0.3 mg/0.3 mL IJ SOAJ injection Inject 0.3 mg into the muscle as needed for anaphylaxis.     Evolocumab (REPATHA SURECLICK) 140 MG/ML SOAJ Inject 140 mg into the skin every 14 (fourteen) days.     fesoterodine (TOVIAZ) 4 MG TB24 tablet Take 4 mg by mouth daily.     Fluticasone-Umeclidin-Vilant (TRELEGY ELLIPTA) 100-62.5-25 MCG/ACT AEPB Inhale 1 Dose into the lungs daily. 180 each 3   furosemide (LASIX) 20 MG tablet Take 20 mg by mouth daily.     gabapentin (NEURONTIN) 300 MG capsule Take 1 capsule (300 mg total) by mouth 2 (two) times daily. 60 capsule 1   isosorbide mononitrate (IMDUR) 30 MG 24 hr tablet Take 30 mg by mouth 2 (two) times daily.     metFORMIN (GLUCOPHAGE) 500 MG tablet Take 500 mg by mouth 2 (two) times daily.     oxyCODONE (OXY IR/ROXICODONE) 5 MG immediate release tablet Take 1 tablet (5 mg total) by mouth every 4 (four) hours as needed for moderate pain (pain score 4-6). (Patient not taking: Reported on 09/28/2023) 30 tablet 0   potassium chloride (KLOR-CON) 10 MEQ tablet Take 10 mEq by mouth  daily.     sotalol (BETAPACE) 80 MG tablet Take 1 tablet (80 mg total) by mouth every 12 (twelve) hours. 60 tablet 0   tirzepatide (MOUNJARO) 12.5 MG/0.5ML Pen Inject 12.5 mg into the skin every Tuesday.     valACYclovir (VALTREX) 1000 MG tablet Take 1,000 mg by mouth daily.     valsartan (DIOVAN) 320 MG tablet Take 160 mg by mouth daily.     Vibegron (GEMTESA) 75 MG TABS Take 1 tablet by mouth daily.     No current facility-administered medications for this visit.   Facility-Administered Medications Ordered in Other Visits  Medication Dose Route Frequency Provider Last Rate Last Admin   sodium chloride flush (NS)  0.9 % injection 3 mL  3 mL Intravenous Q12H Adrian Blackwater A, MD        Allergies:   Betadine [povidone iodine], Contrast media [iodinated contrast media], Iodine, Metrizamide, Povidone-iodine, and Shellfish allergy    Social History:   reports that she quit smoking about 14 years ago. Her smoking use included cigarettes. She started smoking about 45 years ago. She has a 30 pack-year smoking history. She has never used smokeless tobacco. She reports that she does not drink alcohol and does not use drugs.   Family History:  family history includes Breast cancer in her cousin; CAD (age of onset: 34) in her mother; CAD (age of onset: 35) in her brother; Diabetes in her father; Heart disease in her father; Stomach cancer in her maternal grandfather; Throat cancer in her paternal uncle.    ROS:     Review of Systems  Constitutional: Negative.   HENT: Negative.    Eyes: Negative.   Respiratory: Negative.    Gastrointestinal: Negative.   Genitourinary: Negative.   Musculoskeletal: Negative.   Skin: Negative.   Neurological: Negative.   Endo/Heme/Allergies: Negative.   Psychiatric/Behavioral: Negative.    All other systems reviewed and are negative.     All other systems are reviewed and negative.    PHYSICAL EXAM: VS:  BP 102/65   Pulse 80   Ht 5' (1.524 m)   Wt 159 lb  (72.1 kg)   SpO2 98%   BMI 31.05 kg/m  , BMI Body mass index is 31.05 kg/m. Last weight:  Wt Readings from Last 3 Encounters:  10/22/23 159 lb (72.1 kg)  09/28/23 160 lb (72.6 kg)  09/12/23 171 lb (77.6 kg)     Physical Exam Constitutional:      Appearance: Normal appearance.  Cardiovascular:     Rate and Rhythm: Normal rate and regular rhythm.     Heart sounds: Normal heart sounds.  Pulmonary:     Effort: Pulmonary effort is normal.     Breath sounds: Normal breath sounds.  Musculoskeletal:     Right lower leg: No edema.     Left lower leg: No edema.  Neurological:     Mental Status: She is alert.       EKG:   Recent Labs: 08/31/2023: ALT 10; BUN 20; Creatinine, Ser 0.77; Hemoglobin 10.4; Platelets 237; Potassium 3.8; Sodium 136    Lipid Panel    Component Value Date/Time   CHOL 265 (H) 03/22/2023 1115   CHOL 285 (H) 04/23/2015 1144   TRIG 105 03/22/2023 1115   TRIG 140 04/23/2015 1144   HDL 54 03/22/2023 1115   HDL 50 04/23/2015 1144   CHOLHDL 4.9 (H) 03/22/2023 1115   CHOLHDL 2.3 06/17/2020 0450   VLDL 10 06/17/2020 0450   LDLCALC 193 (H) 03/22/2023 1115   LDLCALC 207 (H) 04/23/2015 1144      Other studies Reviewed: Additional studies/ records that were reviewed today include:  Review of the above records demonstrates:      11/23/2016    2:49 PM  PAD Screen  Previous PAD dx? No  Previous surgical procedure? No  Pain with walking? Yes  Subsides with rest? Yes  Feet/toe relief with dangling? Yes  Painful, non-healing ulcers? No  Extremities discolored? No      ASSESSMENT AND PLAN:    ICD-10-CM   1. Coronary artery disease involving native coronary artery of native heart without angina pectoris  I25.10    mid LAD 45% on cath recently  2. Atrial fibrillation with RVR (HCC)  I48.91     3. Acute diastolic CHF (congestive heart failure) (HCC)  I50.31     4. Primary hypertension  I10     5. Mixed hyperlipidemia  E78.2     6. SOB  (shortness of breath)  R06.02    improving       Problem List Items Addressed This Visit       Cardiovascular and Mediastinum   Atrial fibrillation with RVR (HCC)   HTN (hypertension)   Acute diastolic CHF (congestive heart failure) (HCC)     Other   Hyperlipidemia   SOB (shortness of breath)   Other Visit Diagnoses       Coronary artery disease involving native coronary artery of native heart without angina pectoris    -  Primary   mid LAD 45% on cath recently          Disposition:   Return in about 3 months (around 01/21/2024).    Total time spent: 30 minutes  Signed,  Adrian Blackwater, MD  10/22/2023 1:19 PM    [facility]

## 2023-10-23 ENCOUNTER — Other Ambulatory Visit: Payer: Self-pay | Admitting: Thoracic Surgery (Cardiothoracic Vascular Surgery)

## 2023-10-23 DIAGNOSIS — Z902 Acquired absence of lung [part of]: Secondary | ICD-10-CM

## 2023-10-30 ENCOUNTER — Ambulatory Visit
Admission: RE | Admit: 2023-10-30 | Discharge: 2023-10-30 | Disposition: A | Discharge status: Home / Self Care | Source: Ambulatory Visit | Attending: Thoracic Surgery (Cardiothoracic Vascular Surgery) | Admitting: Thoracic Surgery (Cardiothoracic Vascular Surgery)

## 2023-10-30 ENCOUNTER — Encounter: Payer: Self-pay | Admitting: Thoracic Surgery (Cardiothoracic Vascular Surgery)

## 2023-10-30 ENCOUNTER — Ambulatory Visit (INDEPENDENT_AMBULATORY_CARE_PROVIDER_SITE_OTHER): Payer: Medicare Other | Admitting: Thoracic Surgery (Cardiothoracic Vascular Surgery)

## 2023-10-30 VITALS — BP 106/69 | HR 87 | Resp 20 | Ht 60.0 in | Wt 159.0 lb

## 2023-10-30 DIAGNOSIS — Z902 Acquired absence of lung [part of]: Secondary | ICD-10-CM

## 2023-10-30 DIAGNOSIS — J90 Pleural effusion, not elsewhere classified: Secondary | ICD-10-CM | POA: Diagnosis not present

## 2023-10-30 NOTE — Progress Notes (Signed)
 [address]       [name] [address]             [phone]     HPI: Tammy [name] returns for a scheduled follow-up visit after robotic left lower lobectomy.  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  She underwent robotic assisted left lower lobectomy on 08/29/2023.  Postoperative course was uncomplicated and she went home on day 2.  I saw her in the office on 09/12/2023.  She was doing well with no significant pain.  She did not take any oxycodone postdischarge.  She had some issues with low blood pressure and had a fall, but not syncope.  Past Medical History:  Diagnosis Date   Aortic atherosclerosis (HCC)    Atrial fibrillation (HCC)    a.) CHA2DS2VASc = 5 (age, CHF, HTN, vascular disease history, T2DM);  b.) rate/rhythm maintained on oral diltiazem + sotolol; chronically anticoagulated with apixaban   CAD (coronary artery disease)    a.) cCTA 08/21/2014: Ca2+ = 873 (99th %ile); b.) cCTA 04/10/2023: Ca2+ = 733.7 (93rd %ile; 50-60% mRCA, 50% pLAD, 60% mLCx); c.) LHC 04/24/2023: 45% p-mLAD - med mgmt   CHF (congestive heart failure) (HCC)    a.) TTE 06/17/2020: EF 50-55%, LV dil, mild LVH, mild RVE, mod BAE, G1DD; b.) TTE 12/19/2022: EF >55%, mod LAE, triv TR/PR, G1DD   Cholelithiasis    Chronic calcific pancreatitis (HCC)    Complication of anesthesia    COPD (chronic obstructive pulmonary disease) (HCC)    DDD (degenerative disc disease), lumbar    a.) s/p L3-L5 fusion   DJD (degenerative joint disease)    Encephalitis    Fibromyalgia    Hepatic steatosis    Hiatal hernia    History of kidney stones    HTN (hypertension)    Hyperlipidemia    Lung nodule 10/26/2021    a.) LDCT 10/26/21: 10.5 mm macrolob/slightly spiculated LLL; b.) PET CT 11/09/21: not FCG avid (? 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; f.) CT chest 07/05/23: 1.4x3.0 LLL nod with 1.7cm solid comp   OAB (overactive bladder)    a.) on vibegron + fesoterodine   On apixaban therapy    OSA on CPAP    PONV (postoperative nausea and vomiting)    T2DM (type 2 diabetes mellitus) (HCC)     Current Outpatient Medications  Medication Sig Dispense Refill   acetaminophen (TYLENOL) 500 MG tablet Take 1-2 tablets (500-1,000 mg total) by mouth every 6 (six) hours as needed.     albuterol (VENTOLIN HFA) 108 (90 Base) MCG/ACT inhaler Inhale 2 puffs into the lungs every 6 (six) hours as needed. 8 g 2   apixaban (ELIQUIS) 5 MG TABS tablet Take 5 mg by mouth 2 (two) times daily.     diltiazem (CARDIZEM CD) 240 MG 24 hr capsule Take 1 capsule (240 mg total) by mouth daily. 90 capsule 0   DULoxetine (CYMBALTA) 60 MG capsule Take 60 mg by mouth daily.     EPINEPHrine 0.3 mg/0.3 mL IJ SOAJ injection Inject 0.3 mg into  the muscle as needed for anaphylaxis.     Evolocumab (REPATHA SURECLICK) 140 MG/ML SOAJ Inject 140 mg into the skin every 14 (fourteen) days.     fesoterodine (TOVIAZ) 4 MG TB24 tablet Take 4 mg by mouth daily.     Fluticasone-Umeclidin-Vilant (TRELEGY ELLIPTA) 100-62.5-25 MCG/ACT AEPB Inhale 1 Dose into the lungs daily. 180 each 3   furosemide (LASIX) 20 MG tablet Take 20 mg by mouth daily.     isosorbide mononitrate (IMDUR) 30 MG 24 hr tablet Take 30 mg by mouth 2 (two) times daily.     metFORMIN (GLUCOPHAGE) 500 MG tablet Take 500 mg by mouth 2 (two) times daily.     potassium chloride (KLOR-CON) 10 MEQ tablet Take 10 mEq by mouth daily.     sotalol (BETAPACE) 80 MG tablet Take 1 tablet (80 mg total) by  mouth every 12 (twelve) hours. 60 tablet 0   tirzepatide (MOUNJARO) 12.5 MG/0.5ML Pen Inject 12.5 mg into the skin every Tuesday.     valACYclovir (VALTREX) 1000 MG tablet Take 1,000 mg by mouth daily.     valsartan (DIOVAN) 320 MG tablet Take 160 mg by mouth daily.     Vibegron (GEMTESA) 75 MG TABS Take 1 tablet by mouth daily.     No current facility-administered medications for this visit.   Facility-Administered Medications Ordered in Other Visits  Medication Dose Route Frequency Provider Last Rate Last Admin   sodium chloride flush (NS) 0.9 % injection 3 mL  3 mL Intravenous Q12H Laurier Nancy, MD        Physical Exam BP 106/69 (BP Location: Right Arm, Patient Position: Sitting, Cuff Size: Normal)   Pulse 87   Resp 20   Ht 5' (1.524 m)   Wt 159 lb (72.1 kg)   SpO2 98% Comment: RA  BMI 31.05 kg/m  Well-appearing 73 year old woman in no acute distress Alert and oriented x 3 with no focal deficits Lungs diminished at left base, otherwise clear Cardiac regular rate and rhythm  Diagnostic Tests: CHEST - 2 VIEW   COMPARISON:  Chest x-ray 09/12/2023 and older   FINDINGS: Decreasing left pleural effusion with trace residual. No pneumothorax, edema or consolidation. Normal cardiopericardial silhouette. Calcified aorta. Degenerative changes along the spine.   IMPRESSION: Decreasing left pleural effusion.  Trace residual.     Electronically Signed   By: Karen Kays M.D.   On: 10/30/2023 10:11 I personally reviewed the chest x-ray images.  Postoperative changes from left lower lobectomy.  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  Stage Ia adenocarcinoma left lower lobe-status  post left lower lobectomy.  She has seen Dr. Donneta Romberg.  No adjuvant therapy indicated.  He plans follow-up per protocol.  Status post left lower lobectomy-doing well.  From time to time notices some shortness of breath but not on a regular basis.  No restrictions on her activities.  She is not having any pain.  Will decrease gabapentin to once daily for a week and then if there is no pain she can stop that medication altogether.  Plan: Decrease and then discontinue gabapentin Follow-up with Dr. Donneta Romberg I will be happy to see her back at any time in the future if I can be of any further assistance with her care  Viviann Spare  Jayson Michael, MD [facility] [phone]

## 2023-11-01 ENCOUNTER — Ambulatory Visit (INDEPENDENT_AMBULATORY_CARE_PROVIDER_SITE_OTHER): Payer: Medicare Other | Admitting: Pulmonary Disease

## 2023-11-01 ENCOUNTER — Encounter: Payer: Self-pay | Admitting: Pulmonary Disease

## 2023-11-01 VITALS — BP 124/74 | HR 81 | Temp 97.7°F | Ht 60.0 in | Wt 156.0 lb

## 2023-11-01 DIAGNOSIS — Z87891 Personal history of nicotine dependence: Secondary | ICD-10-CM

## 2023-11-01 DIAGNOSIS — Z48813 Encounter for surgical aftercare following surgery on the respiratory system: Secondary | ICD-10-CM | POA: Diagnosis not present

## 2023-11-01 DIAGNOSIS — J449 Chronic obstructive pulmonary disease, unspecified: Secondary | ICD-10-CM | POA: Diagnosis not present

## 2023-11-01 DIAGNOSIS — Z85118 Personal history of other malignant neoplasm of bronchus and lung: Secondary | ICD-10-CM | POA: Diagnosis not present

## 2023-11-01 DIAGNOSIS — C3492 Malignant neoplasm of unspecified part of left bronchus or lung: Secondary | ICD-10-CM

## 2023-11-01 NOTE — Progress Notes (Signed)
 Subjective:    Patient ID: Tammy [name], female    DOB: 1951-02-21, 73 y.o.   MRN: [id number]  Patient Care Team: Jeannine Milroy., MD as PCP - General (Internal Medicine) Eilleen Grates, MD as PCP - Cardiology (Cardiology) Drake Gens, RN as Oncology Nurse Navigator Marc Senior, MD as Consulting Physician (Pulmonary Disease) Gwyn Leos, MD as Consulting Physician (Oncology)  Chief Complaint  Patient presents with   Follow-up    Occasional shortness of breath on exertion.     BACKGROUND/INTERVAL:Tammy [name] is a 73 year old former smoker (quit 2010, 30 PY) who presents for follow-up on the issue of a left lower lobe nodule noted on LDCT previously.  She was initially evaluated on 20 December 2021.  She was last seen on 28 August 2023 prior to robotic lobectomy.  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  She underwent robotic lobectomy on 29 August 2023.  She has done very well post lobectomy.  HPI Discussed the use of AI scribe software for clinical note transcription with the patient, who gave verbal consent to proceed.  History of Present Illness   Tammy [name] is a 73 year old female with COPD who presents for follow-up after recent surgery. She was referred by Dr. Luna Salinas for follow-up after surgery.  She has increased her use of albuterol  since the recent surgery, attributing this to inflammation and environmental factors such as pollen and viruses. She has experienced a slight increase in coughing but is unsure if it is related to pollen.  She has not had any fevers, chills or sweats.  Despite the cough she is really not bothered much by the symptom.  She has not had any change in sputum color.  No hemoptysis.  Overall feels that she is healing well after surgery.  She is currently on Trelegy and has not experienced increased wheezing. Post-surgery, she did not  require pain medication due to effective nerve blockers.      DATA 10/26/2021 chest LDCT: Left lower lobe pulmonary nodule main diameter of 1.5 cm, poorly defined. 11/09/2021 PET/CT: No signs of hypermetabolic activity on the nodule in question cannot exclude indolent bronchogenic neoplasm, 12-month follow-up chest CT. 03/06/2022 chest CT: Persistent vague area of nodularity unchanged from prior, recommend follow-up CT 6 months. 04/20/2022 PFTs: FEV1 1.48 L or 77% predicted, FVC 2.32 L or 91% predicted, FEV1/FVC 64%, lung volumes normal with mild hyperinflation noted.  Bronchodilator response.  Diffusion capacity normal.  Consistent with moderate obstruction. 11/13/2022 chest CT: Persistent subsolid nodule 2.2 x 0.7 cm with 1.5 cm solid component.  Unchanged overall size, possible increased density.  Coronary calcifications noted 12/29/2022 robotic assisted navigational bronchoscopy: "Atypical" cells noted on biopsy specimen, inconclusive. 05/01/2023 PFTs: FEV1 1.34 L or 71% predicted, FVC 2.75 L or 110% predicted, FEV1/FVC 49%, there is mild hyperinflation and air trapping.  Diffusion capacity normal.  Consistent with moderate to severe obstructive lung disease. 07/05/2023 CT chest without contrast: Formal radiology interpretation not available at the time of visit with the patient.  Subsolid left lower lobe nodule has enlarged compared to prior.  Emphysematous changes. 08/01/2023 biopsies left lower lobe: Positive for mucinous adenocarcinoma with lepidic pattern. 08/24/2023 PET/CT: Part solid left lower lobe pulmonary nodule demonstrates no hypermetabolic activity similar to prior PET/CT.  No hypermetabolic pulmonary nodules demonstrated.  No evidence of metastatic disease.  Cholelithiasis.  Aortic atherosclerosis. 08/27/2023 chest x-ray PA and lateral: No evidence of acute cardiopulmonary process.  Will left lower lobe fiducial marker with vague adjacent groundglass density. 08/29/2023 robotic left  lobectomy: Invasive well-differentiated adenocarcinoma mucinous type, tumor size 1.5 cm in greatest dimension.  Margins free.  Lymph nodes examined 15, lymph nodes involved 0.  Stage pT1b, pN0.  Review of Systems A 10 point review of systems was performed and it is as noted above otherwise negative.   Patient Active Problem List   Diagnosis Date Noted   Cancer of lower lobe of left lung (HCC) 09/28/2023   S/P Robotic Assisted Video Thoracoscopy with Left Lower Lobectomy of Lung 08/29/2023   Mediastinal adenopathy 08/01/2023   Status post total left knee replacement 05/18/2023   Unstable angina (HCC) 04/20/2023   Other chest pain 12/08/2022   COPD suggested by initial evaluation (HCC) 04/20/2022   Nodule of lower lobe of left lung 11/11/2021   Fatigue 12/30/2020   Elevated coronary artery calcium score 12/30/2020   Unilateral primary osteoarthritis, left knee 10/06/2020   Respiratory failure, acute (HCC) 06/28/2020   Acute diastolic CHF (congestive heart failure) (HCC) 06/28/2020   Atrial fibrillation with rapid ventricular response (HCC) 06/28/2020   Acquired thrombophilia (HCC)    Depression    Atrial fibrillation with RVR (HCC) 06/16/2020   Diabetes mellitus (HCC)    HTN (hypertension)    Sleep apnea    Obesity, Class III, BMI 40-49.9 (morbid obesity) (HCC)    Chronic venous insufficiency 12/30/2018   Varicose veins of both lower extremities with inflammation 12/30/2018   DJD (degenerative joint disease) 12/30/2018   Snoring 11/27/2018   Daytime sleepiness 11/27/2018   Educated about COVID-19 virus infection 11/27/2018   SOB (shortness of breath) 11/27/2018   Hyperlipidemia 05/20/2015   Knee pain 06/06/2012    Social History   Tobacco Use   Smoking status: Former    Current packs/day: 0.00    Average packs/day: 1 pack/day for 30.0 years (30.0 ttl pk-yrs)    Types: Cigarettes    Start date: 11/02/1978    Quit date: 11/01/2008    Years since quitting: 15.0   Smokeless  tobacco: Never  Substance Use Topics   Alcohol  use: No    Alcohol /week: 0.0 standard drinks of alcohol     Allergies  Allergen Reactions   Betadine [Povidone Iodine] Anaphylaxis   Contrast Media [Iodinated Contrast Media] Anaphylaxis   Iodine Anaphylaxis   Metrizamide Anaphylaxis   Povidone-Iodine Anaphylaxis   Shellfish Allergy Anaphylaxis    Current Meds  Medication Sig   acetaminophen  (TYLENOL ) 500 MG tablet Take 1-2 tablets (500-1,000 mg total) by mouth every 6 (six) hours as needed.   albuterol  (VENTOLIN  HFA) 108 (90 Base) MCG/ACT inhaler Inhale 2 puffs into the lungs every 6 (six) hours as needed.   apixaban  (ELIQUIS ) 5 MG TABS tablet Take 5 mg by mouth 2 (two) times daily.   diltiazem  (CARDIZEM  CD) 240 MG 24 hr capsule Take 1 capsule (240 mg total) by mouth daily.   DULoxetine  (CYMBALTA ) 60 MG capsule Take 60 mg by mouth daily.   EPINEPHrine  0.3 mg/0.3 mL IJ SOAJ injection Inject 0.3 mg into the muscle as needed for anaphylaxis.   Evolocumab (REPATHA SURECLICK) 140 MG/ML SOAJ Inject 140 mg into the skin every 14 (fourteen) days.   fesoterodine  (TOVIAZ ) 4 MG TB24 tablet Take 4 mg by mouth daily.   Fluticasone -Umeclidin-Vilant (TRELEGY ELLIPTA ) 100-62.5-25 MCG/ACT AEPB Inhale 1 Dose into the lungs daily.   furosemide  (LASIX ) 20 MG tablet Take 20 mg by mouth daily.   isosorbide  mononitrate (IMDUR ) 30 MG 24  hr tablet Take 30 mg by mouth 2 (two) times daily.   metFORMIN  (GLUCOPHAGE ) 500 MG tablet Take 500 mg by mouth 2 (two) times daily.   potassium chloride  (KLOR-CON ) 10 MEQ tablet Take 10 mEq by mouth daily.   sotalol  (BETAPACE ) 80 MG tablet Take 1 tablet (80 mg total) by mouth every 12 (twelve) hours.   tirzepatide (MOUNJARO) 12.5 MG/0.5ML Pen Inject 12.5 mg into the skin every Tuesday.   valACYclovir  (VALTREX ) 1000 MG tablet Take 1,000 mg by mouth daily.   valsartan  (DIOVAN ) 320 MG tablet Take 160 mg by mouth daily.   Vibegron (GEMTESA) 75 MG TABS Take 1 tablet by mouth daily.     Immunization History  Administered Date(s) Administered   Influenza-Unspecified 03/21/2021   PFIZER Comirnaty(Gray Top)Covid-19 Tri-Sucrose Vaccine 10/03/2019, 10/31/2019   Pneumococcal Polysaccharide-23 09/12/2018   Tdap 06/14/2022   Zoster, Live 10/10/2016, 02/04/2017        Objective:     BP 124/74 (BP Location: Left Arm, Patient Position: Sitting, Cuff Size: Normal)   Pulse 81   Temp 97.7 F (36.5 C) (Temporal)   Ht 5' (1.524 m)   Wt 156 lb (70.8 kg)   SpO2 98%   BMI 30.47 kg/m   SpO2: 98 %  GENERAL: Morbidly obese woman, no acute distress, fully ambulatory.  No conversational dyspnea. HEAD: Normocephalic, atraumatic.  EYES: Pupils equal, round, reactive to light.  No scleral icterus.  MOUTH: No prosthesis, few chipped teeth.  Oral mucosa moist.  No thrush. NECK: Supple. No thyromegaly. Trachea midline. No JVD.  No adenopathy. PULMONARY: Good air entry bilaterally.  No adventitious sounds. CARDIOVASCULAR: S1 and S2. Regular rate and rhythm.  No rubs, murmurs or gallops heard.. ABDOMEN: Obese, otherwise benign. MUSCULOSKELETAL: No joint deformity, no clubbing, trace lower extremity edema.  NEUROLOGIC: Grossly nonfocal, gait slow.  Speech is fluent. SKIN: Intact,warm,dry.  Multiple varicosities lower extremities, mild stasis changes. PSYCH: Mood and behavior normal.         Assessment & Plan:     ICD-10-CM   1. Non-small cell cancer of left lung (HCC)  C34.92 CT CHEST WO CONTRAST    2. Stage 2 moderate COPD by GOLD classification (HCC)  J44.9       Orders Placed This Encounter  Procedures   CT CHEST WO CONTRAST    Schedule around October (6 months) follow-up after lung cancer resection.    Standing Status:   Future    Expected Date:   05/02/2024    Expiration Date:   10/31/2024    Preferred imaging location?:   [facility]   Discussion:    Postoperative status following lung surgery She is recovering well from recent lung surgery, with  effective nerve blockers eliminating the need for pain medication. Increased albuterol  use may be due to postoperative inflammation and environmental factors like pollen. She reports increased coughing, potentially related to pollen exposure or recovery. Recovery progress is impressive, with robotic surgery minimizing invasiveness by avoiding rib removal and extensive incisions. - Order chest CT in six months to monitor postoperative status. - She had stage I lung cancer with overall good prognosis, no adjuvant therapy enthesis chemo or radiation) recommended. - Will need CT scan every 6 to 12 months for the first 2 to 3 years followed by annual CT scan thereafter.  Chronic Obstructive Pulmonary Disease (COPD) Currently on Trelegy for COPD management. Increased albuterol  use may be due to environmental factors and postoperative changes. Lung sounds are clear, indicating well-managed COPD. - Continue  Trelegy for COPD management. - Schedule follow-up appointment in three months for COPD evaluation.     Advised if symptoms do not improve or worsen, to please contact office for sooner follow up or seek emergency care.    I spent 32 minutes of dedicated to the care of this patient on the date of this encounter to include pre-visit review of records, face-to-face time with the patient discussing conditions above, post visit ordering of testing, clinical documentation with the electronic health record, making appropriate referrals as documented, and communicating necessary findings to members of the patients care team.     C. Chloe Counter, MD Advanced Bronchoscopy PCCM [facility]    *This note was generated using voice recognition software/Dragon and/or AI transcription program.  Despite best efforts to proofread, errors can occur which can change the meaning. Any transcriptional errors that result from this process are unintentional and may not be fully corrected at the time of  dictation.

## 2023-11-01 NOTE — Patient Instructions (Signed)
 VISIT SUMMARY:  You had a follow-up appointment today to check on your recovery after recent lung surgery. You mentioned an increased use of albuterol and a slight increase in coughing, which may be due to inflammation and environmental factors like pollen. Your recovery is progressing well, and your COPD appears to be well-managed.  YOUR PLAN:  -POSTOPERATIVE STATUS FOLLOWING LUNG SURGERY: You are recovering well from your recent lung surgery. The increased use of albuterol may be due to inflammation and environmental factors like pollen. Your recovery is impressive, and the robotic surgery helped minimize invasiveness. We will order a chest CT in six months to monitor your postoperative status.  -CHRONIC OBSTRUCTIVE PULMONARY DISEASE (COPD): COPD is a chronic lung disease that makes it hard to breathe. You are currently managing it well with Trelegy. The increased use of albuterol may be due to environmental factors and postoperative changes. Your lung sounds are clear, indicating that your COPD is well-managed. Continue using Trelegy, and we will schedule a follow-up appointment in three months to evaluate your COPD.  INSTRUCTIONS:  Please schedule a chest CT in six months to monitor your postoperative status. Additionally, schedule a follow-up appointment in three months for COPD monitoring.

## 2023-11-06 ENCOUNTER — Ambulatory Visit: Payer: Medicare Other | Admitting: Cardiovascular Disease

## 2023-11-07 DIAGNOSIS — I2510 Atherosclerotic heart disease of native coronary artery without angina pectoris: Secondary | ICD-10-CM | POA: Diagnosis not present

## 2023-11-07 DIAGNOSIS — E785 Hyperlipidemia, unspecified: Secondary | ICD-10-CM | POA: Diagnosis not present

## 2023-11-07 DIAGNOSIS — E1149 Type 2 diabetes mellitus with other diabetic neurological complication: Secondary | ICD-10-CM | POA: Diagnosis not present

## 2023-11-07 DIAGNOSIS — I480 Paroxysmal atrial fibrillation: Secondary | ICD-10-CM | POA: Diagnosis not present

## 2023-11-07 DIAGNOSIS — I10 Essential (primary) hypertension: Secondary | ICD-10-CM | POA: Diagnosis not present

## 2023-11-09 DIAGNOSIS — M170 Bilateral primary osteoarthritis of knee: Secondary | ICD-10-CM | POA: Diagnosis not present

## 2023-11-10 ENCOUNTER — Encounter: Payer: Self-pay | Admitting: Pulmonary Disease

## 2023-11-14 DIAGNOSIS — E669 Obesity, unspecified: Secondary | ICD-10-CM | POA: Diagnosis not present

## 2023-11-14 DIAGNOSIS — G4733 Obstructive sleep apnea (adult) (pediatric): Secondary | ICD-10-CM | POA: Diagnosis not present

## 2023-11-14 DIAGNOSIS — G729 Myopathy, unspecified: Secondary | ICD-10-CM | POA: Diagnosis not present

## 2023-11-14 DIAGNOSIS — F325 Major depressive disorder, single episode, in full remission: Secondary | ICD-10-CM | POA: Diagnosis not present

## 2023-11-14 DIAGNOSIS — Z0000 Encounter for general adult medical examination without abnormal findings: Secondary | ICD-10-CM | POA: Diagnosis not present

## 2023-11-14 DIAGNOSIS — J439 Emphysema, unspecified: Secondary | ICD-10-CM | POA: Diagnosis not present

## 2023-11-14 DIAGNOSIS — I480 Paroxysmal atrial fibrillation: Secondary | ICD-10-CM | POA: Diagnosis not present

## 2023-11-14 DIAGNOSIS — Z860101 Personal history of adenomatous and serrated colon polyps: Secondary | ICD-10-CM | POA: Diagnosis not present

## 2023-11-14 DIAGNOSIS — R82998 Other abnormal findings in urine: Secondary | ICD-10-CM | POA: Diagnosis not present

## 2023-11-14 DIAGNOSIS — I10 Essential (primary) hypertension: Secondary | ICD-10-CM | POA: Diagnosis not present

## 2023-11-14 DIAGNOSIS — D6869 Other thrombophilia: Secondary | ICD-10-CM | POA: Diagnosis not present

## 2023-11-14 DIAGNOSIS — E1149 Type 2 diabetes mellitus with other diabetic neurological complication: Secondary | ICD-10-CM | POA: Diagnosis not present

## 2023-11-14 DIAGNOSIS — I2510 Atherosclerotic heart disease of native coronary artery without angina pectoris: Secondary | ICD-10-CM | POA: Diagnosis not present

## 2023-11-14 DIAGNOSIS — E785 Hyperlipidemia, unspecified: Secondary | ICD-10-CM | POA: Diagnosis not present

## 2023-11-14 DIAGNOSIS — E1129 Type 2 diabetes mellitus with other diabetic kidney complication: Secondary | ICD-10-CM | POA: Diagnosis not present

## 2023-11-14 DIAGNOSIS — Z85118 Personal history of other malignant neoplasm of bronchus and lung: Secondary | ICD-10-CM | POA: Diagnosis not present

## 2023-11-14 DIAGNOSIS — F17201 Nicotine dependence, unspecified, in remission: Secondary | ICD-10-CM | POA: Diagnosis not present

## 2023-11-14 DIAGNOSIS — Z1331 Encounter for screening for depression: Secondary | ICD-10-CM | POA: Diagnosis not present

## 2023-11-14 DIAGNOSIS — Z1339 Encounter for screening examination for other mental health and behavioral disorders: Secondary | ICD-10-CM | POA: Diagnosis not present

## 2023-12-07 DIAGNOSIS — M170 Bilateral primary osteoarthritis of knee: Secondary | ICD-10-CM | POA: Diagnosis not present

## 2024-01-08 DIAGNOSIS — M170 Bilateral primary osteoarthritis of knee: Secondary | ICD-10-CM | POA: Diagnosis not present

## 2024-01-22 ENCOUNTER — Ambulatory Visit (INDEPENDENT_AMBULATORY_CARE_PROVIDER_SITE_OTHER): Admitting: Cardiovascular Disease

## 2024-01-22 ENCOUNTER — Encounter: Payer: Self-pay | Admitting: Cardiovascular Disease

## 2024-01-22 VITALS — BP 80/50 | HR 90 | Ht 60.0 in | Wt 142.4 lb

## 2024-01-22 DIAGNOSIS — I10 Essential (primary) hypertension: Secondary | ICD-10-CM | POA: Diagnosis not present

## 2024-01-22 DIAGNOSIS — I2510 Atherosclerotic heart disease of native coronary artery without angina pectoris: Secondary | ICD-10-CM

## 2024-01-22 DIAGNOSIS — E782 Mixed hyperlipidemia: Secondary | ICD-10-CM

## 2024-01-22 DIAGNOSIS — I5031 Acute diastolic (congestive) heart failure: Secondary | ICD-10-CM | POA: Diagnosis not present

## 2024-01-22 DIAGNOSIS — I4891 Unspecified atrial fibrillation: Secondary | ICD-10-CM | POA: Diagnosis not present

## 2024-01-22 DIAGNOSIS — R0789 Other chest pain: Secondary | ICD-10-CM | POA: Diagnosis not present

## 2024-01-22 DIAGNOSIS — I952 Hypotension due to drugs: Secondary | ICD-10-CM | POA: Diagnosis not present

## 2024-01-22 DIAGNOSIS — R0602 Shortness of breath: Secondary | ICD-10-CM

## 2024-01-22 MED ORDER — DAPAGLIFLOZIN PROPANEDIOL 10 MG PO TABS: 10.0000 mg | ORAL_TABLET | Freq: Every day | ORAL | 3 refills | Status: AC

## 2024-01-22 MED ORDER — FUROSEMIDE 20 MG PO TABS: 20.0000 mg | ORAL_TABLET | Freq: Two times a day (BID) | ORAL | 11 refills | Status: AC

## 2024-01-22 MED ORDER — ISOSORBIDE MONONITRATE ER 30 MG PO TB24: 30.0000 mg | ORAL_TABLET | Freq: Every day | ORAL | 1 refills | Status: AC

## 2024-01-22 MED ORDER — VALSARTAN 40 MG PO TABS: 40.0000 mg | ORAL_TABLET | Freq: Every day | ORAL | 11 refills | Status: AC

## 2024-01-22 MED ORDER — DAPAGLIFLOZIN PROPANEDIOL 10 MG PO TABS: 10.0000 mg | ORAL_TABLET | Freq: Every day | ORAL | 3 refills | Status: DC

## 2024-01-22 NOTE — Progress Notes (Signed)
 Cardiology Office Note   Date:  01/22/2024   ID:  Tammy [name] March 07, 1951, MRN [id number]  PCP:  Tammy Elsie [name], [name]  Cardiologist:  Tammy Bathe, [name]      History of Present Illness: Tammy [name] is a 73 y.o. female who presents for  Chief Complaint  Patient presents with   Follow-up    3 months    Has severe SOB on minimal exertion, associated with left arm for 2 weeks. Was fine prior to that.      Past Medical History:  Diagnosis Date   Aortic atherosclerosis (HCC)    Atrial fibrillation (HCC)    a.) CHA2DS2VASc = 5 (age, CHF, HTN, vascular disease history, T2DM);  b.) rate/rhythm maintained on oral diltiazem  + sotolol; chronically anticoagulated with apixaban    CAD (coronary artery disease)    a.) cCTA 08/21/2014: Ca2+ = 873 (99th %ile); b.) cCTA 04/10/2023: Ca2+ = 733.7 (93rd %ile; 50-60% mRCA, 50% pLAD, 60% mLCx); c.) LHC 04/24/2023: 45% p-mLAD - med mgmt   CHF (congestive heart failure) (HCC)    a.) TTE 06/17/2020: EF 50-55%, LV dil, mild LVH, mild RVE, mod BAE, G1DD; b.) TTE 12/19/2022: EF >55%, mod LAE, triv TR/PR, G1DD   Cholelithiasis    Chronic calcific pancreatitis (HCC)    Complication of anesthesia    COPD (chronic obstructive pulmonary disease) (HCC)    DDD (degenerative disc disease), lumbar    a.) s/p L3-L5 fusion   DJD (degenerative joint disease)    Encephalitis    Fibromyalgia    Hepatic steatosis    Hiatal hernia    History of kidney stones    HTN (hypertension)    Hyperlipidemia    Lung nodule 10/26/2021   a.) LDCT 10/26/21: 10.5 mm macrolob/slightly spiculated LLL; b.) PET CT 11/09/21: not FCG avid (? 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; f.) CT chest 07/05/23: 1.4x3.0 LLL nod with 1.7cm solid comp   OAB (overactive  bladder)    a.) on vibegron + fesoterodine    On apixaban  therapy    OSA on CPAP    PONV (postoperative nausea and vomiting)    T2DM (type 2 diabetes mellitus) (HCC)      Past Surgical History:  Procedure Laterality Date   APPENDECTOMY     BACK SURGERY  2022   BREAST CYST EXCISION     BREAST EXCISIONAL BIOPSY Left 2004   COLONOSCOPY WITH PROPOFOL  N/A 11/26/2020   Procedure: COLONOSCOPY WITH PROPOFOL ;  Surgeon: Tammy [name];  Location: [facility];  Service: Endoscopy;  Laterality: N/A;   COLONOSCOPY WITH PROPOFOL  N/A 02/16/2023   Procedure: COLONOSCOPY WITH PROPOFOL ;  Surgeon: Tammy [name];  Location: [facility];  Service: Gastroenterology;  Laterality: N/A;   DIAGNOSTIC LAPAROSCOPY     HEMOSTASIS CLIP PLACEMENT  11/26/2020   Procedure: HEMOSTASIS CLIP PLACEMENT;  Surgeon: Tammy [name];  Location: [facility];  Service: Endoscopy;;   INTERCOSTAL NERVE BLOCK Left 08/29/2023   Procedure: INTERCOSTAL NERVE BLOCK;  Surgeon: Tammy [name];  Location: [facility] OR;  Service: Thoracic;  Laterality: Left;   KNEE ARTHROSCOPY     LEFT HEART CATH AND CORONARY ANGIOGRAPHY N/A 04/24/2023   Procedure: LEFT HEART CATH AND CORONARY ANGIOGRAPHY;  Surgeon: [name] Tammy LABOR, [name];  Location: [facility];  Service: Cardiovascular;  Laterality: N/A;   LYMPH NODE DISSECTION Left 08/29/2023   Procedure: LYMPH NODE DISSECTION;  Surgeon: Tammy [name];  Location: [facility];  Service: Thoracic;  Laterality: Left;   POLYPECTOMY  11/26/2020   Procedure: POLYPECTOMY;  Surgeon: Tammy [name];  Location: [facility];  Service: Endoscopy;;   POLYPECTOMY  02/16/2023   Procedure: POLYPECTOMY;  Surgeon: Tammy [name];  Location: [facility];  Service: Gastroenterology;;   SUBMUCOSAL TATTOO INJECTION  11/26/2020   Procedure: SUBMUCOSAL TATTOO INJECTION;  Surgeon: Tammy [name];  Location: [facility];  Service: Endoscopy;;   TEE WITHOUT CARDIOVERSION N/A 06/29/2020    Procedure: TRANSESOPHAGEAL ECHOCARDIOGRAM (TEE) with DCCV;  Surgeon: Tammy [name];  Location: [facility] ORS;  Service: Cardiovascular;  Laterality: N/A;   TOTAL KNEE ARTHROPLASTY Left 05/18/2023   Procedure: LEFT TOTAL KNEE ARTHROPLASTY;  Surgeon: Tammy [name];  Location: [facility];  Service: Orthopedics;  Laterality: Left;     Current Outpatient Medications  Medication Sig Dispense Refill   acetaminophen  (TYLENOL ) 500 MG tablet Take 1-2 tablets (500-1,000 mg total) by mouth every 6 (six) hours as needed.     albuterol  (VENTOLIN  HFA) 108 (90 Base) MCG/ACT inhaler Inhale 2 puffs into the lungs every 6 (six) hours as needed. 8 g 2   apixaban  (ELIQUIS ) 5 MG TABS tablet Take 5 mg by mouth 2 (two) times daily.     diltiazem  (CARDIZEM  CD) 240 MG 24 hr capsule Take 1 capsule (240 mg total) by mouth daily. 90 capsule 0   DULoxetine  (CYMBALTA ) 60 MG capsule Take 60 mg by mouth daily.     EPINEPHrine  0.3 mg/0.3 mL IJ SOAJ injection Inject 0.3 mg into the muscle as needed for anaphylaxis.     Evolocumab (REPATHA SURECLICK) 140 MG/ML SOAJ Inject 140 mg into the skin every 14 (fourteen) days.     fesoterodine  (TOVIAZ ) 4 MG TB24 tablet Take 4 mg by mouth daily.     Fluticasone -Umeclidin-Vilant (TRELEGY ELLIPTA ) 100-62.5-25 MCG/ACT AEPB Inhale 1 Dose into the lungs daily. 180 each 3   furosemide  (LASIX ) 20 MG tablet Take 1 tablet (20 mg total) by mouth 2 (two) times daily. 30 tablet 11   isosorbide  mononitrate (IMDUR ) 30 MG 24 hr tablet Take 1 tablet (30 mg total) by mouth daily. 30 tablet 1   metFORMIN  (GLUCOPHAGE ) 500 MG tablet Take 500 mg by mouth 2 (two) times daily.     potassium chloride  (KLOR-CON ) 10 MEQ tablet Take 10 mEq by mouth daily.     sotalol  (BETAPACE ) 80 MG tablet Take 1 tablet (80 mg total) by mouth every 12 (twelve) hours. 60 tablet 0   tirzepatide (MOUNJARO) 12.5 MG/0.5ML Pen Inject 12.5 mg into the skin every Tuesday.     valACYclovir  (VALTREX ) 1000 MG tablet Take 1,000 mg  by mouth daily.     valsartan  (DIOVAN ) 40 MG tablet Take 1 tablet (40 mg total) by mouth daily. 30 tablet 11   Vibegron (GEMTESA) 75 MG TABS Take 1 tablet by mouth daily.     dapagliflozin  propanediol (FARXIGA ) 10 MG TABS tablet Take 1 tablet (10 mg total) by mouth daily before breakfast. 30 tablet 3   No current facility-administered medications for this visit.   Facility-Administered Medications Ordered in Other Visits  Medication Dose Route Frequency Provider Last Rate Last Admin   sodium chloride  flush (NS) 0.9 % injection 3 mL  3 mL Intravenous Q12H Tammy [name]        Allergies:  Betadine [povidone iodine], Contrast media [iodinated contrast media], Iodine, Metrizamide, Povidone-iodine, and Shellfish allergy    Social History:   reports that she quit smoking about 15 years ago. Her smoking use included cigarettes. She started smoking about 45 years ago. She has a 30 pack-year smoking history. She has never used smokeless tobacco. She reports that she does not drink alcohol  and does not use drugs.   Family History:  family history includes Breast cancer in her cousin; CAD (age of onset: 24) in her mother; CAD (age of onset: 61) in her brother; Diabetes in her father; Heart disease in her father; Stomach cancer in her maternal grandfather; Throat cancer in her paternal uncle.    ROS:     Review of Systems  Constitutional: Negative.   HENT: Negative.    Eyes: Negative.   Respiratory: Negative.    Gastrointestinal: Negative.   Genitourinary: Negative.   Musculoskeletal: Negative.   Skin: Negative.   Neurological: Negative.   Endo/Heme/Allergies: Negative.   Psychiatric/Behavioral: Negative.    All other systems reviewed and are negative.     All other systems are reviewed and negative.    PHYSICAL EXAM: VS:  BP (!) 80/50   Pulse 90   Ht 5' (1.524 m)   Wt 142 lb 6.4 oz (64.6 kg)   SpO2 98%   BMI 27.81 kg/m  , BMI Body mass index is 27.81 kg/m. Last weight:   Wt Readings from Last 3 Encounters:  01/22/24 142 lb 6.4 oz (64.6 kg)  11/01/23 156 lb (70.8 kg)  10/30/23 159 lb (72.1 kg)     Physical Exam Constitutional:      Appearance: Normal appearance.  Cardiovascular:     Rate and Rhythm: Normal rate and regular rhythm.     Heart sounds: Normal heart sounds.  Pulmonary:     Effort: Pulmonary effort is normal.     Breath sounds: Normal breath sounds.  Musculoskeletal:     Right lower leg: No edema.     Left lower leg: No edema.  Neurological:     Mental Status: She is alert.       EKG:  NSR 73/MIN WNL Recent Labs: 08/31/2023: ALT 10; BUN 20; Creatinine, Ser 0.77; Hemoglobin 10.4; Platelets 237; Potassium 3.8; Sodium 136    Lipid Panel    Component Value Date/Time   CHOL 265 (H) 03/22/2023 1115   CHOL 285 (H) 04/23/2015 1144   TRIG 105 03/22/2023 1115   TRIG 140 04/23/2015 1144   HDL 54 03/22/2023 1115   HDL 50 04/23/2015 1144   CHOLHDL 4.9 (H) 03/22/2023 1115   CHOLHDL 2.3 06/17/2020 0450   VLDL 10 06/17/2020 0450   LDLCALC 193 (H) 03/22/2023 1115   LDLCALC 207 (H) 04/23/2015 1144      Other studies Reviewed: Additional studies/ records that were reviewed today include:  Review of the above records demonstrates:      11/23/2016    2:49 PM  PAD Screen  Previous PAD dx? No  Previous surgical procedure? No  Pain with walking? Yes  Subsides with rest? Yes  Feet/toe relief with dangling? Yes  Painful, non-healing ulcers? No  Extremities discolored? No      ASSESSMENT AND PLAN:    ICD-10-CM   1. SOB (shortness of breath)  R06.02 PCV ECHOCARDIOGRAM COMPLETE    MYOCARDIAL PERFUSION IMAGING    valsartan  (DIOVAN ) 40 MG tablet    isosorbide  mononitrate (IMDUR ) 30 MG 24 hr tablet    furosemide  (LASIX ) 20 MG  tablet    dapagliflozin  propanediol (FARXIGA ) 10 MG TABS tablet    DISCONTINUED: dapagliflozin  propanediol (FARXIGA ) 10 MG TABS tablet   Add FARXIGA , LASIX  BID 20 MG,., Do echo.STRESS TEST    2. Atrial  fibrillation with RVR (HCC)  I48.91 PCV ECHOCARDIOGRAM COMPLETE    MYOCARDIAL PERFUSION IMAGING    valsartan  (DIOVAN ) 40 MG tablet    isosorbide  mononitrate (IMDUR ) 30 MG 24 hr tablet    furosemide  (LASIX ) 20 MG tablet    dapagliflozin  propanediol (FARXIGA ) 10 MG TABS tablet    DISCONTINUED: dapagliflozin  propanediol (FARXIGA ) 10 MG TABS tablet    3. Primary hypertension  I10 PCV ECHOCARDIOGRAM COMPLETE    MYOCARDIAL PERFUSION IMAGING    valsartan  (DIOVAN ) 40 MG tablet    isosorbide  mononitrate (IMDUR ) 30 MG 24 hr tablet    furosemide  (LASIX ) 20 MG tablet    dapagliflozin  propanediol (FARXIGA ) 10 MG TABS tablet    DISCONTINUED: dapagliflozin  propanediol (FARXIGA ) 10 MG TABS tablet    4. Acute diastolic CHF (congestive heart failure) (HCC)  I50.31 PCV ECHOCARDIOGRAM COMPLETE    MYOCARDIAL PERFUSION IMAGING    valsartan  (DIOVAN ) 40 MG tablet    isosorbide  mononitrate (IMDUR ) 30 MG 24 hr tablet    furosemide  (LASIX ) 20 MG tablet    dapagliflozin  propanediol (FARXIGA ) 10 MG TABS tablet    DISCONTINUED: dapagliflozin  propanediol (FARXIGA ) 10 MG TABS tablet    5. Coronary artery disease involving native coronary artery of native heart without angina pectoris  I25.10 PCV ECHOCARDIOGRAM COMPLETE    MYOCARDIAL PERFUSION IMAGING    valsartan  (DIOVAN ) 40 MG tablet    isosorbide  mononitrate (IMDUR ) 30 MG 24 hr tablet    furosemide  (LASIX ) 20 MG tablet    dapagliflozin  propanediol (FARXIGA ) 10 MG TABS tablet    DISCONTINUED: dapagliflozin  propanediol (FARXIGA ) 10 MG TABS tablet    6. Mixed hyperlipidemia  E78.2 PCV ECHOCARDIOGRAM COMPLETE    MYOCARDIAL PERFUSION IMAGING    valsartan  (DIOVAN ) 40 MG tablet    isosorbide  mononitrate (IMDUR ) 30 MG 24 hr tablet    furosemide  (LASIX ) 20 MG tablet    dapagliflozin  propanediol (FARXIGA ) 10 MG TABS tablet    DISCONTINUED: dapagliflozin  propanediol (FARXIGA ) 10 MG TABS tablet    7. Hypotension due to drugs  I95.2 PCV ECHOCARDIOGRAM COMPLETE     MYOCARDIAL PERFUSION IMAGING    valsartan  (DIOVAN ) 40 MG tablet    isosorbide  mononitrate (IMDUR ) 30 MG 24 hr tablet    furosemide  (LASIX ) 20 MG tablet    dapagliflozin  propanediol (FARXIGA ) 10 MG TABS tablet    DISCONTINUED: dapagliflozin  propanediol (FARXIGA ) 10 MG TABS tablet   decrease diovan  from 320 to 40 mg daily, change imdur  to once a day    8. Other chest pain  R07.89 furosemide  (LASIX ) 20 MG tablet    dapagliflozin  propanediol (FARXIGA ) 10 MG TABS tablet    DISCONTINUED: dapagliflozin  propanediol (FARXIGA ) 10 MG TABS tablet   SET UP, ECHO, STRESS TEST. EKG NSR 73/MIN NO ACUTE CHANGES       Problem List Items Addressed This Visit       Cardiovascular and Mediastinum   Atrial fibrillation with RVR (HCC)   Relevant Medications   valsartan  (DIOVAN ) 40 MG tablet   isosorbide  mononitrate (IMDUR ) 30 MG 24 hr tablet   furosemide  (LASIX ) 20 MG tablet   dapagliflozin  propanediol (FARXIGA ) 10 MG TABS tablet   Other Relevant Orders   PCV ECHOCARDIOGRAM COMPLETE   MYOCARDIAL PERFUSION IMAGING   HTN (hypertension)   Relevant Medications  valsartan  (DIOVAN ) 40 MG tablet   isosorbide  mononitrate (IMDUR ) 30 MG 24 hr tablet   furosemide  (LASIX ) 20 MG tablet   dapagliflozin  propanediol (FARXIGA ) 10 MG TABS tablet   Other Relevant Orders   PCV ECHOCARDIOGRAM COMPLETE   MYOCARDIAL PERFUSION IMAGING   Acute diastolic CHF (congestive heart failure) (HCC)   Relevant Medications   valsartan  (DIOVAN ) 40 MG tablet   isosorbide  mononitrate (IMDUR ) 30 MG 24 hr tablet   furosemide  (LASIX ) 20 MG tablet   dapagliflozin  propanediol (FARXIGA ) 10 MG TABS tablet   Other Relevant Orders   PCV ECHOCARDIOGRAM COMPLETE   MYOCARDIAL PERFUSION IMAGING     Other   Hyperlipidemia   Relevant Medications   valsartan  (DIOVAN ) 40 MG tablet   isosorbide  mononitrate (IMDUR ) 30 MG 24 hr tablet   furosemide  (LASIX ) 20 MG tablet   dapagliflozin  propanediol (FARXIGA ) 10 MG TABS tablet   Other Relevant  Orders   PCV ECHOCARDIOGRAM COMPLETE   MYOCARDIAL PERFUSION IMAGING   SOB (shortness of breath) - Primary   Relevant Medications   valsartan  (DIOVAN ) 40 MG tablet   isosorbide  mononitrate (IMDUR ) 30 MG 24 hr tablet   furosemide  (LASIX ) 20 MG tablet   dapagliflozin  propanediol (FARXIGA ) 10 MG TABS tablet   Other Relevant Orders   PCV ECHOCARDIOGRAM COMPLETE   MYOCARDIAL PERFUSION IMAGING   Other chest pain   Relevant Medications   furosemide  (LASIX ) 20 MG tablet   dapagliflozin  propanediol (FARXIGA ) 10 MG TABS tablet   Other Visit Diagnoses       Coronary artery disease involving native coronary artery of native heart without angina pectoris       Relevant Medications   valsartan  (DIOVAN ) 40 MG tablet   isosorbide  mononitrate (IMDUR ) 30 MG 24 hr tablet   furosemide  (LASIX ) 20 MG tablet   dapagliflozin  propanediol (FARXIGA ) 10 MG TABS tablet   Other Relevant Orders   PCV ECHOCARDIOGRAM COMPLETE   MYOCARDIAL PERFUSION IMAGING     Hypotension due to drugs       decrease diovan  from 320 to 40 mg daily, change imdur  to once a day   Relevant Medications   valsartan  (DIOVAN ) 40 MG tablet   isosorbide  mononitrate (IMDUR ) 30 MG 24 hr tablet   furosemide  (LASIX ) 20 MG tablet   dapagliflozin  propanediol (FARXIGA ) 10 MG TABS tablet   Other Relevant Orders   PCV ECHOCARDIOGRAM COMPLETE   MYOCARDIAL PERFUSION IMAGING          Disposition:   Return in about 2 weeks (around 02/05/2024) for ECHO, STRESS TEST AND F/U.    Total time spent: 50 minutes  Signed,  Tammy Bathe, [name]  01/22/2024 1:53 PM    [facility]

## 2024-01-29 DIAGNOSIS — M25661 Stiffness of right knee, not elsewhere classified: Secondary | ICD-10-CM | POA: Diagnosis not present

## 2024-01-29 DIAGNOSIS — M25561 Pain in right knee: Secondary | ICD-10-CM | POA: Diagnosis not present

## 2024-01-29 DIAGNOSIS — R262 Difficulty in walking, not elsewhere classified: Secondary | ICD-10-CM | POA: Diagnosis not present

## 2024-01-29 DIAGNOSIS — M1711 Unilateral primary osteoarthritis, right knee: Secondary | ICD-10-CM | POA: Diagnosis not present

## 2024-02-04 ENCOUNTER — Ambulatory Visit: Admitting: Pulmonary Disease

## 2024-02-04 VITALS — BP 100/52 | HR 76 | Ht 60.0 in | Wt 142.0 lb

## 2024-02-04 DIAGNOSIS — D72829 Elevated white blood cell count, unspecified: Secondary | ICD-10-CM | POA: Insufficient documentation

## 2024-02-04 DIAGNOSIS — C3492 Malignant neoplasm of unspecified part of left bronchus or lung: Secondary | ICD-10-CM

## 2024-02-04 DIAGNOSIS — R0602 Shortness of breath: Secondary | ICD-10-CM

## 2024-02-04 DIAGNOSIS — J449 Chronic obstructive pulmonary disease, unspecified: Secondary | ICD-10-CM | POA: Diagnosis not present

## 2024-02-04 DIAGNOSIS — Z85118 Personal history of other malignant neoplasm of bronchus and lung: Secondary | ICD-10-CM | POA: Insufficient documentation

## 2024-02-04 LAB — NITRIC OXIDE: Nitric Oxide: >5

## 2024-02-04 NOTE — Progress Notes (Unsigned)
 Subjective:    Patient ID: Tammy [name], female    DOB: 12-18-1950, 73 y.o.   MRN: [id number]  Patient Care Team: Loreli Elsie [name], MD as PCP - General (Internal Medicine) Lavona Agent, MD as PCP - Cardiology (Cardiology) Verdene Gills, RN as Oncology Nurse Navigator Tamea Dedra CROME, MD as Consulting Physician (Pulmonary Disease) Rennie Cindy SAUNDERS, MD as Consulting Physician (Oncology)  Chief Complaint  Patient presents with  . COPD    Increased SHOB; using Alb HFA more frequently, does help  . Lung Cancer    BACKGROUND/INTERVAL:  HPI  DATA 10/26/2021 chest LDCT: Left lower lobe pulmonary nodule main diameter of 1.5 cm, poorly defined. 11/09/2021 PET/CT: No signs of hypermetabolic activity on the nodule in question cannot exclude indolent bronchogenic neoplasm, 24-month follow-up chest CT. 03/06/2022 chest CT: Persistent vague area of nodularity unchanged from prior, recommend follow-up CT 6 months. 04/20/2022 PFTs: FEV1 1.48 L or 77% predicted, FVC 2.32 L or 91% predicted, FEV1/FVC 64%, lung volumes normal with mild hyperinflation noted.  Bronchodilator response.  Diffusion capacity normal.  Consistent with moderate obstruction. 11/13/2022 chest CT: Persistent subsolid nodule 2.2 x 0.7 cm with 1.5 cm solid component.  Unchanged overall size, possible increased density.  Coronary calcifications noted 12/29/2022 robotic assisted navigational bronchoscopy: Atypical cells noted on biopsy specimen, inconclusive. 05/01/2023 PFTs: FEV1 1.34 L or 71% predicted, FVC 2.75 L or 110% predicted, FEV1/FVC 49%, there is mild hyperinflation and air trapping.  Diffusion capacity normal.  Consistent with moderate to severe obstructive lung disease. 07/05/2023 CT chest without contrast: Formal radiology interpretation not available at the time of visit with the patient.  Subsolid left lower lobe nodule has enlarged compared to prior.  Emphysematous changes. 08/01/2023 biopsies  left lower lobe: Positive for mucinous adenocarcinoma with lepidic pattern. 08/24/2023 PET/CT: Part solid left lower lobe pulmonary nodule demonstrates no hypermetabolic activity similar to prior PET/CT.  No hypermetabolic pulmonary nodules demonstrated.  No evidence of metastatic disease.  Cholelithiasis.  Aortic atherosclerosis. 08/27/2023 chest x-ray PA and lateral: No evidence of acute cardiopulmonary process.  Will left lower lobe fiducial marker with vague adjacent groundglass density. 08/29/2023 robotic left lobectomy: Invasive well-differentiated adenocarcinoma mucinous type, tumor size 1.5 cm in greatest dimension.  Margins free.  Lymph nodes examined 15, lymph nodes involved 0.  Stage pT1b, pN0.  Review of Systems A 10 point review of systems was performed and it is as noted above otherwise negative.   Patient Active Problem List   Diagnosis Date Noted  . History of adenocarcinoma of lung 02/04/2024  . Leukocytosis 02/04/2024  . 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 S/P Robotic Assisted Video Thoracoscopy with Left Lower Lobectomy of Lung 08/29/2023  . Mediastinal adenopathy 08/01/2023  . Status post total left knee replacement 05/18/2023  . Unstable angina (HCC) 04/20/2023  . Other chest pain 12/08/2022  . COPD suggested by initial evaluation (HCC) 04/20/2022  . Nodule of lower lobe of left lung 11/11/2021  . Fatigue 12/30/2020  . Elevated coronary artery calcium score 12/30/2020  . Unilateral primary osteoarthritis, left knee 10/06/2020  . Respiratory failure, acute (HCC) 06/28/2020  . Acute diastolic CHF (congestive heart failure) (HCC) 06/28/2020  . Atrial fibrillation with rapid ventricular response (HCC) 06/28/2020  . Acquired thrombophilia (HCC)   . Depression   . Atrial fibrillation with RVR (HCC) 06/16/2020  . Diabetes mellitus (HCC)   . HTN (hypertension)   . Sleep apnea   . 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 Chronic venous  insufficiency 12/30/2018  . Varicose veins of both lower extremities with inflammation 12/30/2018  . DJD (degenerative joint disease) 12/30/2018  . Snoring 11/27/2018  . Daytime sleepiness 11/27/2018  . Educated about COVID-19 virus infection 11/27/2018  . SOB (shortness of breath) 11/27/2018  . Hyperlipidemia 05/20/2015  . Knee pain 06/06/2012    Social History   Tobacco Use  . Smoking status: Former    Current packs/day: 0.00    Average packs/day: 1 pack/day for 30.0 years (30.0 ttl pk-yrs)    Types: Cigarettes    Start date: 11/02/1978    Quit date: 11/01/2008    Years since quitting: 15.2  . Smokeless tobacco: Never  Substance Use Topics  . Alcohol  use: No    Alcohol /week: 0.0 standard drinks of alcohol     Allergies  Allergen Reactions  . Betadine [Povidone Iodine] Anaphylaxis  . Contrast Media [Iodinated Contrast Media] Anaphylaxis  . Iodine Anaphylaxis  . Metrizamide Anaphylaxis  . Povidone-Iodine Anaphylaxis  . Shellfish Allergy Anaphylaxis    Current Meds  Medication Sig  . acetaminophen  (TYLENOL ) 500 MG tablet Take 1-2 tablets (500-1,000 mg total) by mouth every 6 (six) hours as needed.  . albuterol  (VENTOLIN  HFA) 108 (90 Base) MCG/ACT inhaler Inhale 2 puffs into the lungs every 6 (six) hours as needed.  . apixaban  (ELIQUIS ) 5 MG TABS tablet Take 5 mg by mouth 2 (two) times daily.  . dapagliflozin  propanediol (FARXIGA ) 10 MG TABS tablet Take 1 tablet (10 mg total) by mouth daily before breakfast.  . diltiazem  (CARDIZEM  CD) 240 MG 24 hr capsule Take 1 capsule (240 mg total) by mouth daily.  . DULoxetine  (CYMBALTA ) 60 MG capsule Take 60 mg by mouth daily.  . EPINEPHrine  0.3 mg/0.3 mL IJ SOAJ injection Inject 0.3 mg into the muscle as needed for anaphylaxis.  . Evolocumab (REPATHA SURECLICK) 140 MG/ML SOAJ Inject 140 mg into the skin every 14 (fourteen) days.  . fesoterodine  (TOVIAZ ) 4 MG TB24 tablet Take 4 mg by mouth daily.  . Fluticasone -Umeclidin-Vilant  (TRELEGY ELLIPTA ) 100-62.5-25 MCG/ACT AEPB Inhale 1 Dose into the lungs daily.  . furosemide  (LASIX ) 20 MG tablet Take 1 tablet (20 mg total) by mouth 2 (two) times daily.  . isosorbide  mononitrate (IMDUR ) 30 MG 24 hr tablet Take 1 tablet (30 mg total) by mouth daily.  . metFORMIN  (GLUCOPHAGE ) 500 MG tablet Take 500 mg by mouth 2 (two) times daily.  . potassium chloride  (KLOR-CON ) 10 MEQ tablet Take 10 mEq by mouth daily.  . sotalol  (BETAPACE ) 80 MG tablet Take 1 tablet (80 mg total) by mouth every 12 (twelve) hours.  . tirzepatide (MOUNJARO) 12.5 MG/0.5ML Pen Inject 12.5 mg into the skin every Tuesday.  . valACYclovir  (VALTREX ) 1000 MG tablet Take 1,000 mg by mouth daily.  . valsartan  (DIOVAN ) 40 MG tablet Take 1 tablet (40 mg total) by mouth daily.  . Vibegron (GEMTESA) 75 MG TABS Take 1 tablet by mouth daily.    Immunization History  Administered Date(s) Administered  . Fluzone Influenza virus vaccine,trivalent (IIV3), split virus 06/04/2013, 08/05/2014  . Influenza-Unspecified 03/21/2021  . PFIZER Comirnaty(Gray Top)Covid-19 Tri-Sucrose Vaccine 10/03/2019, 10/31/2019  . PFIZER(Purple Top)SARS-COV-2 Vaccination 05/23/2021  . Pneumococcal Conjugate-13 09/27/2016, 09/10/2017  . Pneumococcal Polysaccharide-23 09/12/2018  . Td (Adult),5 Lf Tetanus Toxid, Preservative Free 12/15/2009  . Tdap 06/14/2022  . Zoster, Live 06/04/2013, 10/10/2016, 02/04/2017        Objective:     BP (!) 100/52 (BP Location: Left Arm,  Patient Position: Sitting, Cuff Size: Normal)   Pulse 76   Ht 5' (1.524 m)   Wt 142 lb (64.4 kg)   SpO2 96%   BMI 27.73 kg/m   SpO2: 96 %  GENERAL: Morbidly obese woman, no acute distress, fully ambulatory.  No conversational dyspnea. HEAD: Normocephalic, atraumatic.  EYES: Pupils equal, round, reactive to light.  No scleral icterus.  MOUTH: No prosthesis, few chipped teeth.  Oral mucosa moist.  No thrush. NECK: Supple. No thyromegaly. Trachea midline. No JVD.  No  adenopathy. PULMONARY: Good air entry bilaterally.  No adventitious sounds. CARDIOVASCULAR: S1 and S2. Regular rate and rhythm.  No rubs, murmurs or gallops heard.. ABDOMEN: Obese, otherwise benign. MUSCULOSKELETAL: No joint deformity, no clubbing, trace lower extremity edema.  NEUROLOGIC: Grossly nonfocal, gait slow.  Speech is fluent. SKIN: Intact,warm,dry.  Multiple varicosities lower extremities, mild stasis changes. PSYCH: Mood and behavior normal.   Lab Results  Component Value Date   NITRICOXIDE < 5 02/04/2024  *No evidence of type II inflammation present.  Ambulatory oxymetry was performed today:  At rest on room air oxygen  saturation was XXX, the patient ambulated at a XXX pace, completed XXX laps, O2 nadirXXX, XXX shortness of breath.  Resting heart rate was XXX bpm at maximum for this exercise XXX bpm.     Assessment & Plan:   No diagnosis found.  No orders of the defined types were placed in this encounter.   No orders of the defined types were placed in this encounter.      Advised if symptoms do not improve or worsen, to please contact office for sooner follow up or seek emergency care.    I spent xxx minutes of dedicated to the care of this patient on the date of this encounter to include pre-visit review of records, face-to-face time with the patient discussing conditions above, post visit ordering of testing, clinical documentation with the electronic health record, making appropriate referrals as documented, and communicating necessary findings to members of the patients care team.     C. Leita Sanders, MD Advanced Bronchoscopy PCCM [facility]    *This note was generated using voice recognition software/Dragon and/or AI transcription program.  Despite best efforts to proofread, errors can occur which can change the meaning. Any transcriptional errors that result from this process are unintentional and may not be fully corrected at the  time of dictation.

## 2024-02-04 NOTE — Patient Instructions (Signed)
 VISIT SUMMARY:  Today, we discussed your recent increase in shortness of breath, particularly during activities like grocery shopping and doing laundry. You mentioned that you have been using your albuterol  inhaler more frequently and that your symptoms seem to worsen with the heat. We reviewed your current medications and noted that you have an upcoming cardiac evaluation.  YOUR PLAN:  -COPD WITH ASTHMATIC BRONCHITIS: Chronic obstructive pulmonary disease (COPD) with asthmatic bronchitis is a long-term lung condition that makes it hard to breathe. Your recent increase in shortness of breath may be related to your heart or the hot weather. Continue using your current medications, including Trelegy and your albuterol  inhaler as needed. We will re-evaluate your condition in two months after your cardiac workup is complete.  -HEART MURMUR: A heart murmur is an unusual sound heard between heartbeats. Your chronic heart murmur is consistent with previous findings, and your recent shortness of breath may be related to your heart. You have an ultrasound and a stress test scheduled, and we will await the results of these tests. Please follow up with your cardiologist for further management.  INSTRUCTIONS:  Please continue with your current medications and use your albuterol  inhaler as needed. Follow up with your cardiologist for the results of your ultrasound and stress test. We will re-evaluate your condition in two months after your cardiac workup is complete.

## 2024-02-05 ENCOUNTER — Encounter: Payer: Self-pay | Admitting: Pulmonary Disease

## 2024-02-05 DIAGNOSIS — M1711 Unilateral primary osteoarthritis, right knee: Secondary | ICD-10-CM | POA: Diagnosis not present

## 2024-02-05 DIAGNOSIS — R262 Difficulty in walking, not elsewhere classified: Secondary | ICD-10-CM | POA: Diagnosis not present

## 2024-02-05 DIAGNOSIS — M25661 Stiffness of right knee, not elsewhere classified: Secondary | ICD-10-CM | POA: Diagnosis not present

## 2024-02-05 DIAGNOSIS — M25561 Pain in right knee: Secondary | ICD-10-CM | POA: Diagnosis not present

## 2024-02-06 ENCOUNTER — Ambulatory Visit (INDEPENDENT_AMBULATORY_CARE_PROVIDER_SITE_OTHER)

## 2024-02-06 DIAGNOSIS — I371 Nonrheumatic pulmonary valve insufficiency: Secondary | ICD-10-CM | POA: Diagnosis not present

## 2024-02-06 DIAGNOSIS — I952 Hypotension due to drugs: Secondary | ICD-10-CM

## 2024-02-06 DIAGNOSIS — M170 Bilateral primary osteoarthritis of knee: Secondary | ICD-10-CM | POA: Diagnosis not present

## 2024-02-06 DIAGNOSIS — I351 Nonrheumatic aortic (valve) insufficiency: Secondary | ICD-10-CM | POA: Diagnosis not present

## 2024-02-06 DIAGNOSIS — I5031 Acute diastolic (congestive) heart failure: Secondary | ICD-10-CM

## 2024-02-06 DIAGNOSIS — E782 Mixed hyperlipidemia: Secondary | ICD-10-CM

## 2024-02-06 DIAGNOSIS — I10 Essential (primary) hypertension: Secondary | ICD-10-CM

## 2024-02-06 DIAGNOSIS — I361 Nonrheumatic tricuspid (valve) insufficiency: Secondary | ICD-10-CM

## 2024-02-06 DIAGNOSIS — R0602 Shortness of breath: Secondary | ICD-10-CM

## 2024-02-06 DIAGNOSIS — I2510 Atherosclerotic heart disease of native coronary artery without angina pectoris: Secondary | ICD-10-CM

## 2024-02-06 DIAGNOSIS — I4891 Unspecified atrial fibrillation: Secondary | ICD-10-CM

## 2024-02-07 ENCOUNTER — Ambulatory Visit

## 2024-02-07 DIAGNOSIS — R0602 Shortness of breath: Secondary | ICD-10-CM | POA: Diagnosis not present

## 2024-02-07 DIAGNOSIS — I4891 Unspecified atrial fibrillation: Secondary | ICD-10-CM | POA: Diagnosis not present

## 2024-02-07 DIAGNOSIS — I2510 Atherosclerotic heart disease of native coronary artery without angina pectoris: Secondary | ICD-10-CM | POA: Diagnosis not present

## 2024-02-07 DIAGNOSIS — I5031 Acute diastolic (congestive) heart failure: Secondary | ICD-10-CM | POA: Diagnosis not present

## 2024-02-07 DIAGNOSIS — I952 Hypotension due to drugs: Secondary | ICD-10-CM

## 2024-02-07 DIAGNOSIS — I10 Essential (primary) hypertension: Secondary | ICD-10-CM

## 2024-02-07 DIAGNOSIS — E782 Mixed hyperlipidemia: Secondary | ICD-10-CM

## 2024-02-07 MED ORDER — TECHNETIUM TC 99M SESTAMIBI GENERIC - CARDIOLITE
31.4000 | Freq: Once | INTRAVENOUS | Status: AC | PRN
Start: 2024-02-07 — End: 2024-02-07
  Administered 2024-02-07: 31.4 via INTRAVENOUS

## 2024-02-07 MED ORDER — TECHNETIUM TC 99M SESTAMIBI GENERIC - CARDIOLITE
10.9700 | Freq: Once | INTRAVENOUS | Status: AC | PRN
  Administered 2024-02-07: 10.97 via INTRAVENOUS

## 2024-02-11 ENCOUNTER — Ambulatory Visit (INDEPENDENT_AMBULATORY_CARE_PROVIDER_SITE_OTHER): Admitting: Cardiovascular Disease

## 2024-02-11 ENCOUNTER — Encounter: Payer: Self-pay | Admitting: Cardiovascular Disease

## 2024-02-11 VITALS — BP 80/52 | HR 78 | Ht 61.0 in | Wt 140.2 lb

## 2024-02-11 DIAGNOSIS — I5031 Acute diastolic (congestive) heart failure: Secondary | ICD-10-CM

## 2024-02-11 DIAGNOSIS — I4891 Unspecified atrial fibrillation: Secondary | ICD-10-CM | POA: Diagnosis not present

## 2024-02-11 DIAGNOSIS — I10 Essential (primary) hypertension: Secondary | ICD-10-CM | POA: Diagnosis not present

## 2024-02-11 DIAGNOSIS — R0602 Shortness of breath: Secondary | ICD-10-CM

## 2024-02-11 DIAGNOSIS — I2510 Atherosclerotic heart disease of native coronary artery without angina pectoris: Secondary | ICD-10-CM

## 2024-02-11 NOTE — Progress Notes (Signed)
 Cardiology Office Note   Date:  02/11/2024   ID:  Leani, Myron 01-16-1951, MRN [id number]  PCP:  Loreli Elsie [name], MD  Cardiologist:  Denyse Bathe, MD      History of Present Illness: Tammy [name] is a 73 y.o. female who presents for  Chief Complaint  Patient presents with   Follow-up    2 week echo/ nst results    Bp is low, but not dizzy      Past Medical History:  Diagnosis Date   Aortic atherosclerosis (HCC)    Atrial fibrillation (HCC)    a.) CHA2DS2VASc = 5 (age, CHF, HTN, vascular disease history, T2DM);  b.) rate/rhythm maintained on oral diltiazem  + sotolol; chronically anticoagulated with apixaban    CAD (coronary artery disease)    a.) cCTA 08/21/2014: Ca2+ = 873 (99th %ile); b.) cCTA 04/10/2023: Ca2+ = 733.7 (93rd %ile; 50-60% mRCA, 50% pLAD, 60% mLCx); c.) LHC 04/24/2023: 45% p-mLAD - med mgmt   CHF (congestive heart failure) (HCC)    a.) TTE 06/17/2020: EF 50-55%, LV dil, mild LVH, mild RVE, mod BAE, G1DD; b.) TTE 12/19/2022: EF >55%, mod LAE, triv TR/PR, G1DD   Cholelithiasis    Chronic calcific pancreatitis (HCC)    Complication of anesthesia    COPD (chronic obstructive pulmonary disease) (HCC)    DDD (degenerative disc disease), lumbar    a.) s/p L3-L5 fusion   DJD (degenerative joint disease)    Encephalitis    Fibromyalgia    Hepatic steatosis    Hiatal hernia    History of kidney stones    HTN (hypertension)    Hyperlipidemia    Lung nodule 10/26/2021   a.) LDCT 10/26/21: 10.5 mm macrolob/slightly spiculated LLL; b.) PET CT 11/09/21: not FCG avid (? 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; f.) CT chest 07/05/23: 1.4x3.0 LLL nod with 1.7cm solid comp   OAB (overactive bladder)    a.) on vibegron + fesoterodine    On apixaban   therapy    OSA on CPAP    PONV (postoperative nausea and vomiting)    T2DM (type 2 diabetes mellitus) (HCC)      Past Surgical History:  Procedure Laterality Date   APPENDECTOMY     BACK SURGERY  2022   BREAST CYST EXCISION     BREAST EXCISIONAL BIOPSY Left 2004   COLONOSCOPY WITH PROPOFOL  N/A 11/26/2020   Procedure: COLONOSCOPY WITH PROPOFOL ;  Surgeon: Rollin Dover, MD;  Location: [facility];  Service: Endoscopy;  Laterality: N/A;   COLONOSCOPY WITH PROPOFOL  N/A 02/16/2023   Procedure: COLONOSCOPY WITH PROPOFOL ;  Surgeon: Rollin Dover, MD;  Location: [facility];  Service: Gastroenterology;  Laterality: N/A;   DIAGNOSTIC LAPAROSCOPY     HEMOSTASIS CLIP PLACEMENT  11/26/2020   Procedure: HEMOSTASIS CLIP PLACEMENT;  Surgeon: Rollin Dover, MD;  Location: [facility];  Service: Endoscopy;;   INTERCOSTAL NERVE BLOCK Left 08/29/2023   Procedure: INTERCOSTAL NERVE BLOCK;  Surgeon: Kerrin Elspeth BROCKS, MD;  Location: [facility] OR;  Service: Thoracic;  Laterality: Left;   KNEE ARTHROSCOPY     LEFT HEART CATH AND CORONARY ANGIOGRAPHY N/A 04/24/2023   Procedure: LEFT HEART CATH AND CORONARY ANGIOGRAPHY;  Surgeon: Bathe Denyse LABOR, MD;  Location: [facility];  Service: Cardiovascular;  Laterality: N/A;   LYMPH NODE  DISSECTION Left 08/29/2023   Procedure: LYMPH NODE DISSECTION;  Surgeon: Kerrin Elspeth BROCKS, MD;  Location: [facility] OR;  Service: Thoracic;  Laterality: Left;   POLYPECTOMY  11/26/2020   Procedure: POLYPECTOMY;  Surgeon: Rollin Dover, MD;  Location: [facility];  Service: Endoscopy;;   POLYPECTOMY  02/16/2023   Procedure: POLYPECTOMY;  Surgeon: Rollin Dover, MD;  Location: [facility];  Service: Gastroenterology;;   SUBMUCOSAL TATTOO INJECTION  11/26/2020   Procedure: SUBMUCOSAL TATTOO INJECTION;  Surgeon: Rollin Dover, MD;  Location: [facility];  Service: Endoscopy;;   TEE WITHOUT CARDIOVERSION N/A 06/29/2020   Procedure: TRANSESOPHAGEAL ECHOCARDIOGRAM (TEE) with DCCV;   Surgeon: Fernand Denyse LABOR, MD;  Location: [facility];  Service: Cardiovascular;  Laterality: N/A;   TOTAL KNEE ARTHROPLASTY Left 05/18/2023   Procedure: LEFT TOTAL KNEE ARTHROPLASTY;  Surgeon: Vernetta Lonni GRADE, MD;  Location: [facility];  Service: Orthopedics;  Laterality: Left;     Current Outpatient Medications  Medication Sig Dispense Refill   acetaminophen  (TYLENOL ) 500 MG tablet Take 1-2 tablets (500-1,000 mg total) by mouth every 6 (six) hours as needed.     albuterol  (VENTOLIN  HFA) 108 (90 Base) MCG/ACT inhaler Inhale 2 puffs into the lungs every 6 (six) hours as needed. 8 g 2   apixaban  (ELIQUIS ) 5 MG TABS tablet Take 5 mg by mouth 2 (two) times daily.     dapagliflozin  propanediol (FARXIGA ) 10 MG TABS tablet Take 1 tablet (10 mg total) by mouth daily before breakfast. 30 tablet 3   diltiazem  (CARDIZEM  CD) 240 MG 24 hr capsule Take 1 capsule (240 mg total) by mouth daily. 90 capsule 0   DULoxetine  (CYMBALTA ) 60 MG capsule Take 60 mg by mouth daily.     EPINEPHrine  0.3 mg/0.3 mL IJ SOAJ injection Inject 0.3 mg into the muscle as needed for anaphylaxis.     Evolocumab (REPATHA SURECLICK) 140 MG/ML SOAJ Inject 140 mg into the skin every 14 (fourteen) days.     fesoterodine  (TOVIAZ ) 4 MG TB24 tablet Take 4 mg by mouth daily.     Fluticasone -Umeclidin-Vilant (TRELEGY ELLIPTA ) 100-62.5-25 MCG/ACT AEPB Inhale 1 Dose into the lungs daily. 180 each 3   furosemide  (LASIX ) 20 MG tablet Take 1 tablet (20 mg total) by mouth 2 (two) times daily. 30 tablet 11   isosorbide  mononitrate (IMDUR ) 30 MG 24 hr tablet Take 1 tablet (30 mg total) by mouth daily. 30 tablet 1   metFORMIN  (GLUCOPHAGE ) 500 MG tablet Take 500 mg by mouth 2 (two) times daily.     potassium chloride  (KLOR-CON ) 10 MEQ tablet Take 10 mEq by mouth daily.     sotalol  (BETAPACE ) 80 MG tablet Take 1 tablet (80 mg total) by mouth every 12 (twelve) hours. 60 tablet 0   tirzepatide (MOUNJARO) 12.5 MG/0.5ML Pen Inject 12.5 mg into the skin  every Tuesday.     valACYclovir  (VALTREX ) 1000 MG tablet Take 1,000 mg by mouth daily.     valsartan  (DIOVAN ) 40 MG tablet Take 1 tablet (40 mg total) by mouth daily. 30 tablet 11   Vibegron (GEMTESA) 75 MG TABS Take 1 tablet by mouth daily.     No current facility-administered medications for this visit.   Facility-Administered Medications Ordered in Other Visits  Medication Dose Route Frequency Provider Last Rate Last Admin   sodium chloride  flush (NS) 0.9 % injection 3 mL  3 mL Intravenous Q12H Fernand Denyse A, MD        Allergies:   Betadine [povidone iodine], Contrast media [iodinated contrast media],  Iodine, Metrizamide, Povidone-iodine, and Shellfish allergy    Social History:   reports that she quit smoking about 15 years ago. Her smoking use included cigarettes. She started smoking about 45 years ago. She has a 30 pack-year smoking history. She has never used smokeless tobacco. She reports that she does not drink alcohol  and does not use drugs.   Family History:  family history includes Breast cancer in her cousin; CAD (age of onset: 10) in her mother; CAD (age of onset: 16) in her brother; Diabetes in her father; Heart disease in her father; Stomach cancer in her maternal grandfather; Throat cancer in her paternal uncle.    ROS:     Review of Systems  Constitutional: Negative.   HENT: Negative.    Eyes: Negative.   Respiratory: Negative.    Gastrointestinal: Negative.   Genitourinary: Negative.   Musculoskeletal: Negative.   Skin: Negative.   Neurological: Negative.   Endo/Heme/Allergies: Negative.   Psychiatric/Behavioral: Negative.    All other systems reviewed and are negative.     All other systems are reviewed and negative.    PHYSICAL EXAM: VS:  BP (!) 80/52   Pulse 78   Ht 5' 1 (1.549 m)   Wt 140 lb 3.2 oz (63.6 kg)   SpO2 98%   BMI 26.49 kg/m  , BMI Body mass index is 26.49 kg/m. Last weight:  Wt Readings from Last 3 Encounters:  02/11/24 140 lb  3.2 oz (63.6 kg)  02/04/24 142 lb (64.4 kg)  01/22/24 142 lb 6.4 oz (64.6 kg)     Physical Exam Constitutional:      Appearance: Normal appearance.  Cardiovascular:     Rate and Rhythm: Normal rate and regular rhythm.     Heart sounds: Normal heart sounds.  Pulmonary:     Effort: Pulmonary effort is normal.     Breath sounds: Normal breath sounds.  Musculoskeletal:     Right lower leg: No edema.     Left lower leg: No edema.  Neurological:     Mental Status: She is alert.       EKG:   Recent Labs: 08/31/2023: ALT 10; BUN 20; Creatinine, Ser 0.77; Hemoglobin 10.4; Platelets 237; Potassium 3.8; Sodium 136    Lipid Panel    Component Value Date/Time   CHOL 265 (H) 03/22/2023 1115   CHOL 285 (H) 04/23/2015 1144   TRIG 105 03/22/2023 1115   TRIG 140 04/23/2015 1144   HDL 54 03/22/2023 1115   HDL 50 04/23/2015 1144   CHOLHDL 4.9 (H) 03/22/2023 1115   CHOLHDL 2.3 06/17/2020 0450   VLDL 10 06/17/2020 0450   LDLCALC 193 (H) 03/22/2023 1115   LDLCALC 207 (H) 04/23/2015 1144      Other studies Reviewed: Additional studies/ records that were reviewed today include:  Review of the above records demonstrates:      11/23/2016    2:49 PM  PAD Screen  Previous PAD dx? No  Previous surgical procedure? No  Pain with walking? Yes  Subsides with rest? Yes  Feet/toe relief with dangling? Yes  Painful, non-healing ulcers? No  Extremities discolored? No      ASSESSMENT AND PLAN:    ICD-10-CM   1. Atrial fibrillation with RVR (HCC)  I48.91     2. Primary hypertension  I10     3. Acute diastolic CHF (congestive heart failure) (HCC)  I50.31     4. Coronary artery disease involving native coronary artery of native heart without angina pectoris  I25.10     5. SOB (shortness of breath)  R06.02    stress test is normal, echo trace  MR/AR       Problem List Items Addressed This Visit       Cardiovascular and Mediastinum   Atrial fibrillation with RVR (HCC) -  Primary   HTN (hypertension)   Acute diastolic CHF (congestive heart failure) (HCC)     Other   SOB (shortness of breath)   Other Visit Diagnoses       Coronary artery disease involving native coronary artery of native heart without angina pectoris              Disposition:   Return in about 3 months (around 05/13/2024).    Total time spent: 40 minutes  Signed,  Denyse Bathe, MD  02/11/2024 12:02 PM    [facility]

## 2024-02-12 ENCOUNTER — Ambulatory Visit: Admitting: Cardiovascular Disease

## 2024-02-12 DIAGNOSIS — R262 Difficulty in walking, not elsewhere classified: Secondary | ICD-10-CM | POA: Diagnosis not present

## 2024-02-12 DIAGNOSIS — M25561 Pain in right knee: Secondary | ICD-10-CM | POA: Diagnosis not present

## 2024-02-12 DIAGNOSIS — M1711 Unilateral primary osteoarthritis, right knee: Secondary | ICD-10-CM | POA: Diagnosis not present

## 2024-02-12 DIAGNOSIS — M25661 Stiffness of right knee, not elsewhere classified: Secondary | ICD-10-CM | POA: Diagnosis not present

## 2024-02-19 DIAGNOSIS — R262 Difficulty in walking, not elsewhere classified: Secondary | ICD-10-CM | POA: Diagnosis not present

## 2024-02-19 DIAGNOSIS — M1711 Unilateral primary osteoarthritis, right knee: Secondary | ICD-10-CM | POA: Diagnosis not present

## 2024-02-19 DIAGNOSIS — M25661 Stiffness of right knee, not elsewhere classified: Secondary | ICD-10-CM | POA: Diagnosis not present

## 2024-02-19 DIAGNOSIS — M25561 Pain in right knee: Secondary | ICD-10-CM | POA: Diagnosis not present

## 2024-02-27 DIAGNOSIS — M25661 Stiffness of right knee, not elsewhere classified: Secondary | ICD-10-CM | POA: Diagnosis not present

## 2024-02-27 DIAGNOSIS — M1711 Unilateral primary osteoarthritis, right knee: Secondary | ICD-10-CM | POA: Diagnosis not present

## 2024-02-27 DIAGNOSIS — R262 Difficulty in walking, not elsewhere classified: Secondary | ICD-10-CM | POA: Diagnosis not present

## 2024-02-27 DIAGNOSIS — M25561 Pain in right knee: Secondary | ICD-10-CM | POA: Diagnosis not present

## 2024-03-06 DIAGNOSIS — M170 Bilateral primary osteoarthritis of knee: Secondary | ICD-10-CM | POA: Diagnosis not present

## 2024-03-31 ENCOUNTER — Ambulatory Visit: Admitting: Orthopaedic Surgery

## 2024-03-31 ENCOUNTER — Other Ambulatory Visit (INDEPENDENT_AMBULATORY_CARE_PROVIDER_SITE_OTHER): Payer: Self-pay

## 2024-03-31 ENCOUNTER — Encounter: Payer: Self-pay | Admitting: Orthopaedic Surgery

## 2024-03-31 DIAGNOSIS — Z96652 Presence of left artificial knee joint: Secondary | ICD-10-CM

## 2024-03-31 NOTE — Progress Notes (Signed)
 The patient is a 73 year old female who is now 10 months status post a left total knee arthroplasty.  She said a few weeks ago she felt like she may have twisted that knee in bed and has been hurting some since then.  It happened again yesterday.  She denies any instability symptoms.  She is walk without assist device.  She says pivoting and going up and down stairs has been easy.  Examination of her left knee today shows no effusion.  Her range of motion is entirely full without left knee and it is ligamentously stable.  2 views left knee show well-seated total knee arthroplasty with no complicating features.  Over this is something that should calm down with time.  This may have been some scar tissue that may be cause some issues but overall the knee feels stable and I gave her reassurance that overall the knee looks good.  We have been following her for right knee arthritis but thus far she is not ready for knee replacement surgery on the right knee.  At this point follow-up can be as needed and if things worsen she knows to not hesitate to reach out and let us  know.

## 2024-04-07 ENCOUNTER — Encounter: Payer: Self-pay | Admitting: Pulmonary Disease

## 2024-04-07 ENCOUNTER — Ambulatory Visit: Admitting: Pulmonary Disease

## 2024-04-07 VITALS — BP 106/66 | HR 77 | Temp 97.6°F | Ht 61.0 in | Wt 145.0 lb

## 2024-04-07 DIAGNOSIS — J449 Chronic obstructive pulmonary disease, unspecified: Secondary | ICD-10-CM

## 2024-04-07 DIAGNOSIS — E119 Type 2 diabetes mellitus without complications: Secondary | ICD-10-CM | POA: Diagnosis not present

## 2024-04-07 DIAGNOSIS — G4733 Obstructive sleep apnea (adult) (pediatric): Secondary | ICD-10-CM | POA: Diagnosis not present

## 2024-04-07 DIAGNOSIS — Z87891 Personal history of nicotine dependence: Secondary | ICD-10-CM | POA: Diagnosis not present

## 2024-04-07 DIAGNOSIS — C3490 Malignant neoplasm of unspecified part of unspecified bronchus or lung: Secondary | ICD-10-CM | POA: Diagnosis not present

## 2024-04-07 NOTE — Patient Instructions (Addendum)
 VISIT SUMMARY:  Today, we discussed your ongoing health conditions and scheduled a chest CT for next month. You are continuing with your current medications and treatments, and you have received your flu shot.  YOUR PLAN:  -CHRONIC OBSTRUCTIVE PULMONARY DISEASE (COPD): COPD is a chronic lung condition that makes it hard to breathe. Your COPD is well-managed with Trelegy, and you have no new respiratory symptoms. Please continue using Trelegy as prescribed. A chest CT has been ordered for mid next month to monitor your condition.  - HISTORY LUNG CANCER: A CT scan of the chest has been ordered for October, they will call you closer to the time for scheduling.  -OBSTRUCTIVE SLEEP APNEA: Obstructive sleep apnea is a condition where your breathing stops and starts during sleep. Your condition is well-managed with CPAP therapy, and you are feeling well-rested. Please continue using your CPAP machine as directed.  -TYPE 2 DIABETES MELLITUS: Type 2 diabetes is a condition that affects how your body processes blood sugar. Your diabetes is managed with Farxiga  and Mounjaro. Your weight has stabilized after a recent gain of five pounds, and there are no new concerns. Please continue taking Farxiga  and Mounjaro as prescribed.  INSTRUCTIONS:  You are scheduled for a chest CT next month. You will be contacted closer to the time for scheduling.

## 2024-04-07 NOTE — Progress Notes (Signed)
 Subjective:    Patient ID: Tammy [name], female    DOB: [birth year], 73 y.o.   MRN: [id number]  Patient Care Team: Loreli Elsie [name], MD as PCP - General (Internal Medicine) Lavona Agent, MD as PCP - Cardiology (Cardiology) Verdene Gills, RN as Oncology Nurse Navigator Tamea Dedra CROME, MD as Consulting Physician (Pulmonary Disease) Rennie Cindy SAUNDERS, MD as Consulting Physician (Oncology)  Chief Complaint  Patient presents with   COPD    Occasional cough, shortness of breath and wheezing.     BACKGROUND/INTERVAL:Tammy [name] is a 73 year old former smoker (quit 2010, 30 PY) who presents for follow-up on the issue of a left lower lobe nodule noted on LDCT previously.  She was initially evaluated on 20 December 2021.  She was last seen on 04 February 2024.  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  She underwent robotic lobectomy on 29 August 2023.  She has done very well post lobectomy.    HPI Discussed the use of AI scribe software for clinical note transcription with the patient, who gave verbal consent to proceed.  History of Present Illness   Tammy [name] is a 73 year old female who presents for follow-up and scheduling of a chest CT.  She is scheduled for a chest CT next month, with the order already placed. She will be contacted closer to the time for scheduling.  She continues to use Trelegy for her COPD. She has no problems with sleeping and uses a CPAP machine.  She is compliant with CPAP usage.  Her shortness of breath is at baseline.  After noticing some slight increase this has now resolved.  She is currently on Farxiga  and Mounjaro. She mentions a recent weight gain of five pounds this month after previously losing weight, attributing some of her weight management to eating watermelon, which is no longer available.  She confirms having received her flu vaccine.      DATA 10/26/2021 chest LDCT: Left lower lobe pulmonary nodule main diameter of 1.5 cm, poorly defined. 11/09/2021 PET/CT: No signs of hypermetabolic activity on the nodule in question cannot exclude indolent bronchogenic neoplasm, 70-month follow-up chest CT. 03/06/2022 chest CT: Persistent vague area of nodularity unchanged from prior, recommend follow-up CT 6 months. 04/20/2022 PFTs: FEV1 1.48 L or 77% predicted, FVC 2.32 L or 91% predicted, FEV1/FVC 64%, lung volumes normal with mild hyperinflation noted.  Bronchodilator response.  Diffusion capacity normal.  Consistent with moderate obstruction. 11/13/2022 chest CT: Persistent subsolid nodule 2.2 x 0.7 cm with 1.5 cm solid component.  Unchanged overall size, possible increased density.  Coronary calcifications noted 12/29/2022 robotic assisted navigational bronchoscopy: Atypical cells noted on biopsy specimen, inconclusive. 05/01/2023 PFTs: FEV1 1.34 L or 71% predicted, FVC 2.75 L or 110% predicted, FEV1/FVC 49%, there is mild hyperinflation and air trapping.  Diffusion capacity normal.  Consistent with moderate to severe obstructive lung disease. 07/05/2023 CT chest without contrast: Formal radiology interpretation not available at the time of visit with the patient.  Subsolid left lower lobe nodule has enlarged compared to prior.  Emphysematous changes. 08/01/2023 biopsies left lower lobe: Positive for mucinous adenocarcinoma with lepidic pattern. 08/24/2023 PET/CT: Part solid left lower lobe pulmonary nodule demonstrates no hypermetabolic activity similar to prior PET/CT.  No hypermetabolic pulmonary nodules demonstrated.  No evidence of metastatic disease.  Cholelithiasis.  Aortic atherosclerosis. 08/27/2023 chest x-ray PA and lateral: No evidence of acute cardiopulmonary process.  Will left lower lobe fiducial  marker with vague adjacent groundglass density. 08/29/2023 robotic left lobectomy: Invasive well-differentiated adenocarcinoma  mucinous type, tumor size 1.5 cm in greatest dimension.  Margins free.  Lymph nodes examined 15, lymph nodes involved 0.  Stage pT1b, pN0.  Review of Systems A 10 point review of systems was performed and it is as noted above otherwise negative.   Patient Active Problem List   Diagnosis Date Noted   History of adenocarcinoma of lung 02/04/2024   Leukocytosis 02/04/2024   Cancer of lower lobe of left lung (HCC) 09/28/2023   S/P Robotic Assisted Video Thoracoscopy with Left Lower Lobectomy of Lung 08/29/2023   Mediastinal adenopathy 08/01/2023   Status post total left knee replacement 05/18/2023   Unstable angina (HCC) 04/20/2023   Other chest pain 12/08/2022   COPD suggested by initial evaluation 04/20/2022   Nodule of lower lobe of left lung 11/11/2021   Fatigue 12/30/2020   Elevated coronary artery calcium score 12/30/2020   Unilateral primary osteoarthritis, left knee 10/06/2020   Respiratory failure, acute (HCC) 06/28/2020   Acute diastolic CHF (congestive heart failure) (HCC) 06/28/2020   Atrial fibrillation with rapid ventricular response (HCC) 06/28/2020   Acquired thrombophilia    Depression    Atrial fibrillation with RVR (HCC) 06/16/2020   Diabetes mellitus (HCC)    HTN (hypertension)    Sleep apnea    Obesity, Class III, BMI 40-49.9 (morbid obesity) (HCC)    Chronic venous insufficiency 12/30/2018   Varicose veins of both lower extremities with inflammation 12/30/2018   DJD (degenerative joint disease) 12/30/2018   Snoring 11/27/2018   Daytime sleepiness 11/27/2018   Educated about COVID-19 virus infection 11/27/2018   SOB (shortness of breath) 11/27/2018   Hyperlipidemia 05/20/2015   Knee pain 06/06/2012    Social History   Tobacco Use   Smoking status: Former    Current packs/day: 0.00    Average packs/day: 1 pack/day for 30.0 years (30.0 ttl pk-yrs)    Types: Cigarettes    Start date: 11/02/1978    Quit date: 11/01/2008    Years since quitting: 15.4    Smokeless tobacco: Never  Substance Use Topics   Alcohol  use: No    Alcohol /week: 0.0 standard drinks of alcohol     Allergies  Allergen Reactions   Betadine [Povidone Iodine] Anaphylaxis   Contrast Media [Iodinated Contrast Media] Anaphylaxis   Iodine Anaphylaxis   Metrizamide Anaphylaxis   Povidone-Iodine Anaphylaxis   Shellfish Allergy Anaphylaxis    Current Meds  Medication Sig   acetaminophen  (TYLENOL ) 500 MG tablet Take 1-2 tablets (500-1,000 mg total) by mouth every 6 (six) hours as needed.   albuterol  (VENTOLIN  HFA) 108 (90 Base) MCG/ACT inhaler Inhale 2 puffs into the lungs every 6 (six) hours as needed.   apixaban  (ELIQUIS ) 5 MG TABS tablet Take 5 mg by mouth 2 (two) times daily.   dapagliflozin  propanediol (FARXIGA ) 10 MG TABS tablet Take 1 tablet (10 mg total) by mouth daily before breakfast.   diltiazem  (CARDIZEM  CD) 240 MG 24 hr capsule Take 1 capsule (240 mg total) by mouth daily.   DULoxetine  (CYMBALTA ) 60 MG capsule Take 60 mg by mouth daily.   EPINEPHrine  0.3 mg/0.3 mL IJ SOAJ injection Inject 0.3 mg into the muscle as needed for anaphylaxis.   Evolocumab (REPATHA SURECLICK) 140 MG/ML SOAJ Inject 140 mg into the skin every 14 (fourteen) days.   fesoterodine  (TOVIAZ ) 4 MG TB24 tablet Take 4 mg by mouth daily.   Fluticasone -Umeclidin-Vilant (TRELEGY ELLIPTA ) 100-62.5-25 MCG/ACT AEPB Inhale 1 Dose  into the lungs daily.   furosemide  (LASIX ) 20 MG tablet Take 1 tablet (20 mg total) by mouth 2 (two) times daily.   isosorbide  mononitrate (IMDUR ) 30 MG 24 hr tablet Take 1 tablet (30 mg total) by mouth daily.   potassium chloride  (KLOR-CON ) 10 MEQ tablet Take 10 mEq by mouth daily.   sotalol  (BETAPACE ) 80 MG tablet Take 1 tablet (80 mg total) by mouth every 12 (twelve) hours.   tirzepatide (MOUNJARO) 12.5 MG/0.5ML Pen Inject 12.5 mg into the skin every Tuesday.   valACYclovir  (VALTREX ) 1000 MG tablet Take 1,000 mg by mouth daily.   valsartan  (DIOVAN ) 40 MG tablet Take 1  tablet (40 mg total) by mouth daily.   Vibegron (GEMTESA) 75 MG TABS Take 1 tablet by mouth daily.    Immunization History  Administered Date(s) Administered   Fluzone Influenza virus vaccine,trivalent (IIV3), split virus 06/04/2013, 08/05/2014   Influenza-Unspecified 03/21/2021   PFIZER Comirnaty(Gray Top)Covid-19 Tri-Sucrose Vaccine 10/03/2019, 10/31/2019   PFIZER(Purple Top)SARS-COV-2 Vaccination 05/23/2021   Pneumococcal Conjugate-13 09/27/2016, 09/10/2017   Pneumococcal Polysaccharide-23 09/12/2018   Td (Adult),5 Lf Tetanus Toxid, Preservative Free 12/15/2009   Tdap 06/14/2022   Zoster, Live 06/04/2013, 10/10/2016, 02/04/2017        Objective:     BP 106/66   Pulse 77   Temp 97.6 F (36.4 C) (Temporal)   Ht 5' 1 (1.549 m)   Wt 145 lb (65.8 kg)   SpO2 97%   BMI 27.40 kg/m   SpO2: 97 %  GENERAL: Morbidly obese woman, no acute distress, fully ambulatory.  No conversational dyspnea. HEAD: Normocephalic, atraumatic.  EYES: Pupils equal, round, reactive to light.  No scleral icterus.  MOUTH: No prosthesis, few chipped teeth.  Oral mucosa moist.  No thrush. NECK: Supple. No thyromegaly. Trachea midline. No JVD.  No adenopathy. PULMONARY: Good air entry bilaterally.  No adventitious sounds. CARDIOVASCULAR: S1 and S2. Regular rate and rhythm.  Grade 2/6 systolic ejection murmur left sternal border. ABDOMEN: Benign. MUSCULOSKELETAL: No joint deformity, no clubbing, trace lower extremity edema.  NEUROLOGIC: Grossly nonfocal, gait slow.  Speech is fluent. SKIN: Intact,warm,dry.  Multiple varicosities lower extremities, mild stasis changes. PSYCH: Mood and behavior normal.     Assessment & Plan:     ICD-10-CM   1. Stage 2 moderate COPD by GOLD classification (HCC)  J44.9     2. 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 OSA on CPAP  G47.33      Discussion:    Chronic obstructive pulmonary disease (COPD) COPD is well-managed with Trelegy, with no new  respiratory symptoms reported. - Continue Trelegy for COPD management - Order chest CT for mid next month  Obstructive sleep apnea Obstructive sleep apnea is well-managed with CPAP therapy, with no issues reported and she feels well-rested. - Continue CPAP therapy  Type 2 diabetes mellitus Type 2 diabetes is managed with Farxiga  and Mounjaro. Weight has stabilized after a recent gain of five pounds, with no new concerns reported. - Continue Farxiga  and Mounjaro for diabetes management     Advised if symptoms do not improve or worsen, to please contact office for sooner follow up or seek emergency care.    I spent 32 minutes of dedicated to the care of this patient on the date of this encounter to include pre-visit review of records, face-to-face time with the patient discussing conditions above, post visit ordering of testing, clinical documentation with the electronic health record, making appropriate referrals as documented, and  communicating necessary findings to members of the patients care team.     C. Leita Sanders, MD Advanced Bronchoscopy PCCM [facility]    *This note was generated using voice recognition software/Dragon and/or AI transcription program.  Despite best efforts to proofread, errors can occur which can change the meaning. Any transcriptional errors that result from this process are unintentional and may not be fully corrected at the time of dictation.

## 2024-04-23 DIAGNOSIS — G250 Essential tremor: Secondary | ICD-10-CM | POA: Diagnosis not present

## 2024-04-23 DIAGNOSIS — R319 Hematuria, unspecified: Secondary | ICD-10-CM | POA: Diagnosis not present

## 2024-04-23 DIAGNOSIS — E785 Hyperlipidemia, unspecified: Secondary | ICD-10-CM | POA: Diagnosis not present

## 2024-04-23 DIAGNOSIS — R10A2 Flank pain, left side: Secondary | ICD-10-CM | POA: Diagnosis not present

## 2024-04-23 DIAGNOSIS — E039 Hypothyroidism, unspecified: Secondary | ICD-10-CM | POA: Diagnosis not present

## 2024-04-23 DIAGNOSIS — E1149 Type 2 diabetes mellitus with other diabetic neurological complication: Secondary | ICD-10-CM | POA: Diagnosis not present

## 2024-04-23 DIAGNOSIS — M2041 Other hammer toe(s) (acquired), right foot: Secondary | ICD-10-CM | POA: Diagnosis not present

## 2024-04-23 DIAGNOSIS — N3941 Urge incontinence: Secondary | ICD-10-CM | POA: Diagnosis not present

## 2024-04-23 DIAGNOSIS — I10 Essential (primary) hypertension: Secondary | ICD-10-CM | POA: Diagnosis not present

## 2024-04-23 DIAGNOSIS — R5383 Other fatigue: Secondary | ICD-10-CM | POA: Diagnosis not present

## 2024-04-23 DIAGNOSIS — Z860101 Personal history of adenomatous and serrated colon polyps: Secondary | ICD-10-CM | POA: Diagnosis not present

## 2024-04-23 DIAGNOSIS — G629 Polyneuropathy, unspecified: Secondary | ICD-10-CM | POA: Diagnosis not present

## 2024-04-23 DIAGNOSIS — I6523 Occlusion and stenosis of bilateral carotid arteries: Secondary | ICD-10-CM | POA: Diagnosis not present

## 2024-04-23 DIAGNOSIS — Z91013 Allergy to seafood: Secondary | ICD-10-CM | POA: Diagnosis not present

## 2024-04-23 DIAGNOSIS — Z85118 Personal history of other malignant neoplasm of bronchus and lung: Secondary | ICD-10-CM | POA: Diagnosis not present

## 2024-04-25 ENCOUNTER — Ambulatory Visit
Admission: RE | Admit: 2024-04-25 | Discharge: 2024-04-25 | Disposition: A | Discharge status: Home / Self Care | Source: Ambulatory Visit | Attending: Pulmonary Disease | Admitting: Pulmonary Disease

## 2024-04-25 DIAGNOSIS — C3492 Malignant neoplasm of unspecified part of left bronchus or lung: Secondary | ICD-10-CM | POA: Insufficient documentation

## 2024-04-25 DIAGNOSIS — J432 Centrilobular emphysema: Secondary | ICD-10-CM | POA: Diagnosis not present

## 2024-04-25 DIAGNOSIS — C3490 Malignant neoplasm of unspecified part of unspecified bronchus or lung: Secondary | ICD-10-CM | POA: Diagnosis not present

## 2024-04-25 DIAGNOSIS — I3139 Other pericardial effusion (noninflammatory): Secondary | ICD-10-CM | POA: Diagnosis not present

## 2024-04-26 ENCOUNTER — Encounter: Payer: Self-pay | Admitting: Pulmonary Disease

## 2024-04-30 ENCOUNTER — Ambulatory Visit: Payer: Self-pay | Admitting: Pulmonary Disease

## 2024-05-13 ENCOUNTER — Ambulatory Visit: Admitting: Cardiovascular Disease

## 2024-05-13 ENCOUNTER — Encounter: Payer: Self-pay | Admitting: Cardiovascular Disease

## 2024-05-13 VITALS — BP 118/76 | HR 89 | Ht 61.0 in | Wt 141.8 lb

## 2024-05-13 DIAGNOSIS — R0602 Shortness of breath: Secondary | ICD-10-CM | POA: Diagnosis not present

## 2024-05-13 DIAGNOSIS — I3139 Other pericardial effusion (noninflammatory): Secondary | ICD-10-CM | POA: Diagnosis not present

## 2024-05-13 DIAGNOSIS — E782 Mixed hyperlipidemia: Secondary | ICD-10-CM | POA: Diagnosis not present

## 2024-05-13 DIAGNOSIS — I2510 Atherosclerotic heart disease of native coronary artery without angina pectoris: Secondary | ICD-10-CM | POA: Diagnosis not present

## 2024-05-13 DIAGNOSIS — I4891 Unspecified atrial fibrillation: Secondary | ICD-10-CM

## 2024-05-13 DIAGNOSIS — Z0130 Encounter for examination of blood pressure without abnormal findings: Secondary | ICD-10-CM

## 2024-05-13 NOTE — Progress Notes (Signed)
 Cardiology Office Note   Date:  05/13/2024   ID:  Tammy [name], Tammy [name] [birth year], MRN [id number]  PCP:  Loreli Elsie [name], MD  Cardiologist:  Denyse Bathe, MD      History of Present Illness: Tammy [name] is a 73 y.o. female who presents for  Chief Complaint  Patient presents with   Follow-up    3 month follow up. Pt had a CT but has fluid around her heart. Pt has been lightheaded.     Has SOB and lightheadedness.  CT  showed  small pericardial effusion and sent for evaluation.      Past Medical History:  Diagnosis Date   Aortic atherosclerosis    Atrial fibrillation (HCC)    a.) CHA2DS2VASc = 5 (age, CHF, HTN, vascular disease history, T2DM);  b.) rate/rhythm maintained on oral diltiazem  + sotolol; chronically anticoagulated with apixaban    CAD (coronary artery disease)    a.) cCTA 08/21/2014: Ca2+ = 873 (99th %ile); b.) cCTA 04/10/2023: Ca2+ = 733.7 (93rd %ile; 50-60% mRCA, 50% pLAD, 60% mLCx); c.) LHC 04/24/2023: 45% p-mLAD - med mgmt   CHF (congestive heart failure) (HCC)    a.) TTE 06/17/2020: EF 50-55%, LV dil, mild LVH, mild RVE, mod BAE, G1DD; b.) TTE 12/19/2022: EF >55%, mod LAE, triv TR/PR, G1DD   Cholelithiasis    Chronic calcific pancreatitis (HCC)    Complication of anesthesia    COPD (chronic obstructive pulmonary disease) (HCC)    DDD (degenerative disc disease), lumbar    a.) s/p L3-L5 fusion   DJD (degenerative joint disease)    Encephalitis    Fibromyalgia    Hepatic steatosis    Hiatal hernia    History of kidney stones    HTN (hypertension)    Hyperlipidemia    Lung nodule 10/26/2021   a.) LDCT 10/26/21: 10.5 mm macrolob/slightly spiculated LLL; b.) PET CT 11/09/21: not FCG avid (? 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; f.) CT  chest 07/05/23: 1.4x3.0 LLL nod with 1.7cm solid comp   OAB (overactive bladder)    a.) on vibegron + fesoterodine    On apixaban  therapy    OSA on CPAP    PONV (postoperative nausea and vomiting)    T2DM (type 2 diabetes mellitus) (HCC)      Past Surgical History:  Procedure Laterality Date   APPENDECTOMY     BACK SURGERY  2022   BREAST CYST EXCISION     BREAST EXCISIONAL BIOPSY Left 2004   COLONOSCOPY WITH PROPOFOL  N/A 11/26/2020   Procedure: COLONOSCOPY WITH PROPOFOL ;  Surgeon: Rollin Dover, MD;  Location: [facility];  Service: Endoscopy;  Laterality: N/A;   COLONOSCOPY WITH PROPOFOL  N/A 02/16/2023   Procedure: COLONOSCOPY WITH PROPOFOL ;  Surgeon: Rollin Dover, MD;  Location: [facility];  Service: Gastroenterology;  Laterality: N/A;   DIAGNOSTIC LAPAROSCOPY     HEMOSTASIS CLIP PLACEMENT  11/26/2020   Procedure: HEMOSTASIS CLIP PLACEMENT;  Surgeon: Rollin Dover, MD;  Location: [facility];  Service: Endoscopy;;   INTERCOSTAL NERVE BLOCK Left 08/29/2023   Procedure: INTERCOSTAL NERVE BLOCK;  Surgeon: Kerrin Elspeth BROCKS, MD;  Location: [facility] OR;  Service: Thoracic;  Laterality: Left;   KNEE ARTHROSCOPY     LEFT HEART CATH AND CORONARY ANGIOGRAPHY N/A 04/24/2023   Procedure: LEFT HEART CATH AND CORONARY  ANGIOGRAPHY;  Surgeon: Fernand Denyse LABOR, MD;  Location: [facility] INVASIVE CV LAB;  Service: Cardiovascular;  Laterality: N/A;   LYMPH NODE DISSECTION Left 08/29/2023   Procedure: LYMPH NODE DISSECTION;  Surgeon: Kerrin Elspeth BROCKS, MD;  Location: [facility] OR;  Service: Thoracic;  Laterality: Left;   POLYPECTOMY  11/26/2020   Procedure: POLYPECTOMY;  Surgeon: Rollin Dover, MD;  Location: [facility];  Service: Endoscopy;;   POLYPECTOMY  02/16/2023   Procedure: POLYPECTOMY;  Surgeon: Rollin Dover, MD;  Location: [facility];  Service: Gastroenterology;;   SUBMUCOSAL TATTOO INJECTION  11/26/2020   Procedure: SUBMUCOSAL TATTOO INJECTION;  Surgeon: Rollin Dover, MD;  Location: [facility];  Service: Endoscopy;;   TEE WITHOUT CARDIOVERSION N/A 06/29/2020   Procedure: TRANSESOPHAGEAL ECHOCARDIOGRAM (TEE) with DCCV;  Surgeon: Fernand Denyse LABOR, MD;  Location: [facility] ORS;  Service: Cardiovascular;  Laterality: N/A;   TOTAL KNEE ARTHROPLASTY Left 05/18/2023   Procedure: LEFT TOTAL KNEE ARTHROPLASTY;  Surgeon: Vernetta Lonni GRADE, MD;  Location: [facility];  Service: Orthopedics;  Laterality: Left;     Current Outpatient Medications  Medication Sig Dispense Refill   acetaminophen  (TYLENOL ) 500 MG tablet Take 1-2 tablets (500-1,000 mg total) by mouth every 6 (six) hours as needed.     albuterol  (VENTOLIN  HFA) 108 (90 Base) MCG/ACT inhaler Inhale 2 puffs into the lungs every 6 (six) hours as needed. 8 g 2   apixaban  (ELIQUIS ) 5 MG TABS tablet Take 5 mg by mouth 2 (two) times daily.     dapagliflozin  propanediol (FARXIGA ) 10 MG TABS tablet Take 1 tablet (10 mg total) by mouth daily before breakfast. 30 tablet 3   diltiazem  (CARDIZEM  CD) 240 MG 24 hr capsule Take 1 capsule (240 mg total) by mouth daily. 90 capsule 0   DULoxetine  (CYMBALTA ) 60 MG capsule Take 60 mg by mouth daily.     EPINEPHrine  0.3 mg/0.3 mL IJ SOAJ injection Inject 0.3 mg into the muscle as needed for anaphylaxis.     Evolocumab (REPATHA SURECLICK) 140 MG/ML SOAJ Inject 140 mg into the skin every 14 (fourteen) days.     fesoterodine  (TOVIAZ ) 4 MG TB24 tablet Take 4 mg by mouth daily.     Fluticasone -Umeclidin-Vilant (TRELEGY ELLIPTA ) 100-62.5-25 MCG/ACT AEPB Inhale 1 Dose into the lungs daily. 180 each 3   furosemide  (LASIX ) 20 MG tablet Take 1 tablet (20 mg total) by mouth 2 (two) times daily. 30 tablet 11   isosorbide  mononitrate (IMDUR ) 30 MG 24 hr tablet Take 1 tablet (30 mg total) by mouth daily. 30 tablet 1   potassium chloride  (KLOR-CON ) 10 MEQ tablet Take 10 mEq by mouth daily.     sotalol  (BETAPACE ) 80 MG tablet Take 1 tablet (80 mg total) by mouth every 12 (twelve) hours. 60 tablet 0   tirzepatide  (MOUNJARO) 12.5 MG/0.5ML Pen Inject 12.5 mg into the skin every Tuesday.     valACYclovir  (VALTREX ) 1000 MG tablet Take 1,000 mg by mouth daily.     valsartan  (DIOVAN ) 40 MG tablet Take 1 tablet (40 mg total) by mouth daily. 30 tablet 11   Vibegron (GEMTESA) 75 MG TABS Take 1 tablet by mouth daily.     metFORMIN  (GLUCOPHAGE ) 500 MG tablet Take 500 mg by mouth 2 (two) times daily. (Patient not taking: Reported on 05/13/2024)     No current facility-administered medications for this visit.   Facility-Administered Medications Ordered in Other Visits  Medication Dose Route Frequency Provider Last Rate Last Admin   sodium chloride  flush (NS) 0.9 % injection  3 mL  3 mL Intravenous Q12H Fernand Alter A, MD        Allergies:   Betadine [povidone iodine], Contrast media [iodinated contrast media], Iodine, Metrizamide, Povidone-iodine, and Shellfish allergy    Social History:   reports that she quit smoking about 15 years ago. Her smoking use included cigarettes. She started smoking about 45 years ago. She has a 30 pack-year smoking history. She has never used smokeless tobacco. She reports that she does not drink alcohol  and does not use drugs.   Family History:  family history includes Breast cancer in her cousin; CAD (age of onset: 36) in her mother; CAD (age of onset: 45) in her brother; Diabetes in her father; Heart disease in her father; Stomach cancer in her maternal grandfather; Throat cancer in her paternal uncle.    ROS:     Review of Systems  Constitutional: Negative.   HENT: Negative.    Eyes: Negative.   Respiratory: Negative.    Gastrointestinal: Negative.   Genitourinary: Negative.   Musculoskeletal: Negative.   Skin: Negative.   Neurological: Negative.   Endo/Heme/Allergies: Negative.   Psychiatric/Behavioral: Negative.    All other systems reviewed and are negative.     All other systems are reviewed and negative.    PHYSICAL EXAM: VS:  BP 118/76   Pulse 89   Ht 5'  1 (1.549 m)   Wt 141 lb 12.8 oz (64.3 kg)   SpO2 99%   BMI 26.79 kg/m  , BMI Body mass index is 26.79 kg/m. Last weight:  Wt Readings from Last 3 Encounters:  05/13/24 141 lb 12.8 oz (64.3 kg)  04/07/24 145 lb (65.8 kg)  02/11/24 140 lb 3.2 oz (63.6 kg)     Physical Exam Constitutional:      Appearance: Normal appearance.  Cardiovascular:     Rate and Rhythm: Normal rate and regular rhythm.     Heart sounds: Normal heart sounds.  Pulmonary:     Effort: Pulmonary effort is normal.     Breath sounds: Normal breath sounds.  Musculoskeletal:     Right lower leg: No edema.     Left lower leg: No edema.  Neurological:     Mental Status: She is alert.       EKG:   Recent Labs: 08/31/2023: ALT 10; BUN 20; Creatinine, Ser 0.77; Hemoglobin 10.4; Platelets 237; Potassium 3.8; Sodium 136    Lipid Panel    Component Value Date/Time   CHOL 265 (H) 03/22/2023 1115   CHOL 285 (H) 04/23/2015 1144   TRIG 105 03/22/2023 1115   TRIG 140 04/23/2015 1144   HDL 54 03/22/2023 1115   HDL 50 04/23/2015 1144   CHOLHDL 4.9 (H) 03/22/2023 1115   CHOLHDL 2.3 06/17/2020 0450   VLDL 10 06/17/2020 0450   LDLCALC 193 (H) 03/22/2023 1115   LDLCALC 207 (H) 04/23/2015 1144      Other studies Reviewed: Additional studies/ records that were reviewed today include:  Review of the above records demonstrates:      11/23/2016    2:49 PM  PAD Screen  Previous PAD dx? No  Previous surgical procedure? No  Pain with walking? Yes  Subsides with rest? Yes  Feet/toe relief with dangling? Yes  Painful, non-healing ulcers? No  Extremities discolored? No      ASSESSMENT AND PLAN:    ICD-10-CM   1. Pericardial effusion  I31.39 PCV ECHOCARDIOGRAM COMPLETE   small PE on  CT, advise echo as has SOB and  lightheadedness. &/25 echo had no pericardial effusion, so its new on  CT. REecurrence of lung CA is concern.    2. Mixed hyperlipidemia  E78.2 PCV ECHOCARDIOGRAM COMPLETE    3. SOB (shortness  of breath)  R06.02 PCV ECHOCARDIOGRAM COMPLETE    4. Coronary artery disease involving native coronary artery of native heart without angina pectoris  I25.10 PCV ECHOCARDIOGRAM COMPLETE    5. Atrial fibrillation with rapid ventricular response (HCC)  I48.91 PCV ECHOCARDIOGRAM COMPLETE       Problem List Items Addressed This Visit       Cardiovascular and Mediastinum   Atrial fibrillation with rapid ventricular response (HCC)   Relevant Orders   PCV ECHOCARDIOGRAM COMPLETE     Other   Hyperlipidemia   Relevant Orders   PCV ECHOCARDIOGRAM COMPLETE   SOB (shortness of breath)   Relevant Orders   PCV ECHOCARDIOGRAM COMPLETE   Other Visit Diagnoses       Pericardial effusion    -  Primary   small PE on  CT, advise echo as has SOB and lightheadedness. &/25 echo had no pericardial effusion, so its new on  CT. REecurrence of lung CA is concern.   Relevant Orders   PCV ECHOCARDIOGRAM COMPLETE     Coronary artery disease involving native coronary artery of native heart without angina pectoris       Relevant Orders   PCV ECHOCARDIOGRAM COMPLETE          Disposition:   Return in about 4 weeks (around 06/10/2024) for echo and f/u.    Total time spent: 30 minutes  Signed,  Denyse Bathe, MD  05/13/2024 9:51 AM    [facility]

## 2024-05-19 ENCOUNTER — Encounter: Payer: Self-pay | Admitting: Radiology

## 2024-05-19 DIAGNOSIS — D225 Melanocytic nevi of trunk: Secondary | ICD-10-CM | POA: Diagnosis not present

## 2024-05-19 DIAGNOSIS — L814 Other melanin hyperpigmentation: Secondary | ICD-10-CM | POA: Diagnosis not present

## 2024-05-19 DIAGNOSIS — D1801 Hemangioma of skin and subcutaneous tissue: Secondary | ICD-10-CM | POA: Diagnosis not present

## 2024-05-19 DIAGNOSIS — L821 Other seborrheic keratosis: Secondary | ICD-10-CM | POA: Diagnosis not present

## 2024-05-19 DIAGNOSIS — D2272 Melanocytic nevi of left lower limb, including hip: Secondary | ICD-10-CM | POA: Diagnosis not present

## 2024-05-19 DIAGNOSIS — L300 Nummular dermatitis: Secondary | ICD-10-CM | POA: Diagnosis not present

## 2024-05-19 DIAGNOSIS — D692 Other nonthrombocytopenic purpura: Secondary | ICD-10-CM | POA: Diagnosis not present

## 2024-05-21 DIAGNOSIS — M2041 Other hammer toe(s) (acquired), right foot: Secondary | ICD-10-CM | POA: Diagnosis not present

## 2024-05-28 ENCOUNTER — Ambulatory Visit (INDEPENDENT_AMBULATORY_CARE_PROVIDER_SITE_OTHER)

## 2024-05-28 DIAGNOSIS — I361 Nonrheumatic tricuspid (valve) insufficiency: Secondary | ICD-10-CM | POA: Diagnosis not present

## 2024-05-28 DIAGNOSIS — I3139 Other pericardial effusion (noninflammatory): Secondary | ICD-10-CM

## 2024-05-28 DIAGNOSIS — I4891 Unspecified atrial fibrillation: Secondary | ICD-10-CM

## 2024-05-28 DIAGNOSIS — I340 Nonrheumatic mitral (valve) insufficiency: Secondary | ICD-10-CM

## 2024-05-28 DIAGNOSIS — E782 Mixed hyperlipidemia: Secondary | ICD-10-CM

## 2024-05-28 DIAGNOSIS — R0602 Shortness of breath: Secondary | ICD-10-CM

## 2024-05-28 DIAGNOSIS — I2510 Atherosclerotic heart disease of native coronary artery without angina pectoris: Secondary | ICD-10-CM

## 2024-06-02 ENCOUNTER — Encounter: Payer: Self-pay | Admitting: Cardiovascular Disease

## 2024-06-02 ENCOUNTER — Ambulatory Visit (INDEPENDENT_AMBULATORY_CARE_PROVIDER_SITE_OTHER): Admitting: Cardiovascular Disease

## 2024-06-02 VITALS — BP 100/54 | HR 81 | Ht 61.0 in | Wt 136.6 lb

## 2024-06-02 DIAGNOSIS — I2510 Atherosclerotic heart disease of native coronary artery without angina pectoris: Secondary | ICD-10-CM

## 2024-06-02 DIAGNOSIS — I3139 Other pericardial effusion (noninflammatory): Secondary | ICD-10-CM

## 2024-06-02 DIAGNOSIS — I5031 Acute diastolic (congestive) heart failure: Secondary | ICD-10-CM | POA: Diagnosis not present

## 2024-06-02 DIAGNOSIS — G4730 Sleep apnea, unspecified: Secondary | ICD-10-CM | POA: Diagnosis not present

## 2024-06-02 DIAGNOSIS — R0602 Shortness of breath: Secondary | ICD-10-CM | POA: Diagnosis not present

## 2024-06-02 DIAGNOSIS — E782 Mixed hyperlipidemia: Secondary | ICD-10-CM | POA: Diagnosis not present

## 2024-06-02 DIAGNOSIS — I952 Hypotension due to drugs: Secondary | ICD-10-CM

## 2024-06-02 DIAGNOSIS — I4891 Unspecified atrial fibrillation: Secondary | ICD-10-CM

## 2024-06-02 DIAGNOSIS — R0789 Other chest pain: Secondary | ICD-10-CM

## 2024-06-02 MED ORDER — DILTIAZEM HCL ER COATED BEADS 180 MG PO CP24: 180.0000 mg | ORAL_CAPSULE | Freq: Every day | ORAL | 11 refills | Status: AC

## 2024-06-02 NOTE — Progress Notes (Signed)
 Cardiology Office Note   Date:  06/02/2024   ID:  Tammy, [name] May 30, 1951, MRN [id number]  PCP:  Tammy [name]., MD  Cardiologist:  Denyse Bathe, MD      History of Present Illness: Tammy [name] is a 73 y.o. female who presents for  Chief Complaint  Patient presents with   Follow-up    Echo results    Has fatigue, but dizziness or SOB.      Past Medical History:  Diagnosis Date   Aortic atherosclerosis    Atrial fibrillation (HCC)    a.) CHA2DS2VASc = 5 (age, CHF, HTN, vascular disease history, T2DM);  b.) rate/rhythm maintained on oral diltiazem  + sotolol; chronically anticoagulated with apixaban    CAD (coronary artery disease)    a.) cCTA 08/21/2014: Ca2+ = 873 (99th %ile); b.) cCTA 04/10/2023: Ca2+ = 733.7 (93rd %ile; 50-60% mRCA, 50% pLAD, 60% mLCx); c.) LHC 04/24/2023: 45% p-mLAD - med mgmt   CHF (congestive heart failure) (HCC)    a.) TTE 06/17/2020: EF 50-55%, LV dil, mild LVH, mild RVE, mod BAE, G1DD; b.) TTE 12/19/2022: EF >55%, mod LAE, triv TR/PR, G1DD   Cholelithiasis    Chronic calcific pancreatitis (HCC)    Complication of anesthesia    COPD (chronic obstructive pulmonary disease) (HCC)    DDD (degenerative disc disease), lumbar    a.) s/p L3-L5 fusion   DJD (degenerative joint disease)    Encephalitis    Fibromyalgia    Hepatic steatosis    Hiatal hernia    History of kidney stones    HTN (hypertension)    Hyperlipidemia    Lung nodule 10/26/2021   a.) LDCT 10/26/21: 10.5 mm macrolob/slightly spiculated LLL; b.) PET CT 11/09/21: not FCG avid (? 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; f.) CT chest 07/05/23: 1.4x3.0 LLL nod with 1.7cm solid comp   OAB (overactive bladder)    a.) on vibegron + fesoterodine    On apixaban  therapy     OSA on CPAP    PONV (postoperative nausea and vomiting)    T2DM (type 2 diabetes mellitus) (HCC)      Past Surgical History:  Procedure Laterality Date   APPENDECTOMY     BACK SURGERY  2022   BREAST CYST EXCISION     BREAST EXCISIONAL BIOPSY Left 2004   COLONOSCOPY WITH PROPOFOL  N/A 11/26/2020   Procedure: COLONOSCOPY WITH PROPOFOL ;  Surgeon: Rollin Dover, MD;  Location: [facility];  Service: Endoscopy;  Laterality: N/A;   COLONOSCOPY WITH PROPOFOL  N/A 02/16/2023   Procedure: COLONOSCOPY WITH PROPOFOL ;  Surgeon: Rollin Dover, MD;  Location: [facility];  Service: Gastroenterology;  Laterality: N/A;   DIAGNOSTIC LAPAROSCOPY     HEMOSTASIS CLIP PLACEMENT  11/26/2020   Procedure: HEMOSTASIS CLIP PLACEMENT;  Surgeon: Rollin Dover, MD;  Location: [facility];  Service: Endoscopy;;   INTERCOSTAL NERVE BLOCK Left 08/29/2023   Procedure: INTERCOSTAL NERVE BLOCK;  Surgeon: Kerrin Elspeth BROCKS, MD;  Location: [facility] OR;  Service: Thoracic;  Laterality: Left;   KNEE ARTHROSCOPY     LEFT HEART CATH AND CORONARY ANGIOGRAPHY N/A 04/24/2023   Procedure: LEFT HEART CATH AND CORONARY ANGIOGRAPHY;  Surgeon: Bathe Denyse LABOR, MD;  Location: [facility];  Service: Cardiovascular;  Laterality: N/A;   LYMPH NODE DISSECTION Left 08/29/2023  Procedure: LYMPH NODE DISSECTION;  Surgeon: Kerrin Elspeth BROCKS, MD;  Location: [facility] OR;  Service: Thoracic;  Laterality: Left;   POLYPECTOMY  11/26/2020   Procedure: POLYPECTOMY;  Surgeon: Rollin Dover, MD;  Location: [facility];  Service: Endoscopy;;   POLYPECTOMY  02/16/2023   Procedure: POLYPECTOMY;  Surgeon: Rollin Dover, MD;  Location: [facility];  Service: Gastroenterology;;   SUBMUCOSAL TATTOO INJECTION  11/26/2020   Procedure: SUBMUCOSAL TATTOO INJECTION;  Surgeon: Rollin Dover, MD;  Location: [facility];  Service: Endoscopy;;   TEE WITHOUT CARDIOVERSION N/A 06/29/2020   Procedure: TRANSESOPHAGEAL ECHOCARDIOGRAM (TEE) with DCCV;  Surgeon:  Fernand Denyse LABOR, MD;  Location: [facility];  Service: Cardiovascular;  Laterality: N/A;   TOTAL KNEE ARTHROPLASTY Left 05/18/2023   Procedure: LEFT TOTAL KNEE ARTHROPLASTY;  Surgeon: Vernetta Lonni GRADE, MD;  Location: [facility];  Service: Orthopedics;  Laterality: Left;     Current Outpatient Medications  Medication Sig Dispense Refill   acetaminophen  (TYLENOL ) 500 MG tablet Take 1-2 tablets (500-1,000 mg total) by mouth every 6 (six) hours as needed.     albuterol  (VENTOLIN  HFA) 108 (90 Base) MCG/ACT inhaler Inhale 2 puffs into the lungs every 6 (six) hours as needed. 8 g 2   apixaban  (ELIQUIS ) 5 MG TABS tablet Take 5 mg by mouth 2 (two) times daily.     dapagliflozin  propanediol (FARXIGA ) 10 MG TABS tablet Take 1 tablet (10 mg total) by mouth daily before breakfast. 30 tablet 3   diltiazem  (CARDIZEM  CD) 180 MG 24 hr capsule Take 1 capsule (180 mg total) by mouth daily. 30 capsule 11   DULoxetine  (CYMBALTA ) 60 MG capsule Take 60 mg by mouth daily.     EPINEPHrine  0.3 mg/0.3 mL IJ SOAJ injection Inject 0.3 mg into the muscle as needed for anaphylaxis.     Evolocumab (REPATHA SURECLICK) 140 MG/ML SOAJ Inject 140 mg into the skin every 14 (fourteen) days.     fesoterodine  (TOVIAZ ) 4 MG TB24 tablet Take 4 mg by mouth daily.     Fluticasone -Umeclidin-Vilant (TRELEGY ELLIPTA ) 100-62.5-25 MCG/ACT AEPB Inhale 1 Dose into the lungs daily. 180 each 3   furosemide  (LASIX ) 20 MG tablet Take 1 tablet (20 mg total) by mouth 2 (two) times daily. 30 tablet 11   isosorbide  mononitrate (IMDUR ) 30 MG 24 hr tablet Take 1 tablet (30 mg total) by mouth daily. 30 tablet 1   potassium chloride  (KLOR-CON ) 10 MEQ tablet Take 10 mEq by mouth daily.     sotalol  (BETAPACE ) 80 MG tablet Take 1 tablet (80 mg total) by mouth every 12 (twelve) hours. 60 tablet 0   tirzepatide (MOUNJARO) 12.5 MG/0.5ML Pen Inject 12.5 mg into the skin every Tuesday.     valACYclovir  (VALTREX ) 1000 MG tablet Take 1,000 mg by mouth daily.      valsartan  (DIOVAN ) 40 MG tablet Take 1 tablet (40 mg total) by mouth daily. 30 tablet 11   Vibegron (GEMTESA) 75 MG TABS Take 1 tablet by mouth daily.     metFORMIN  (GLUCOPHAGE ) 500 MG tablet Take 500 mg by mouth 2 (two) times daily. (Patient not taking: Reported on 06/02/2024)     No current facility-administered medications for this visit.   Facility-Administered Medications Ordered in Other Visits  Medication Dose Route Frequency Provider Last Rate Last Admin   sodium chloride  flush (NS) 0.9 % injection 3 mL  3 mL Intravenous Q12H Fernand Denyse A, MD        Allergies:   Betadine [povidone iodine], Contrast media [iodinated contrast  media], Iodine, Metrizamide, Povidone-iodine, and Shellfish allergy    Social History:   reports that she quit smoking about 15 years ago. Her smoking use included cigarettes. She started smoking about 45 years ago. She has a 30 pack-year smoking history. She has never used smokeless tobacco. She reports that she does not drink alcohol  and does not use drugs.   Family History:  family history includes Breast cancer in her cousin; CAD (age of onset: 19) in her mother; CAD (age of onset: 68) in her brother; Diabetes in her father; Heart disease in her father; Stomach cancer in her maternal grandfather; Throat cancer in her paternal uncle.    ROS:     Review of Systems  Constitutional: Negative.   HENT: Negative.    Eyes: Negative.   Respiratory: Negative.    Gastrointestinal: Negative.   Genitourinary: Negative.   Musculoskeletal: Negative.   Skin: Negative.   Neurological: Negative.   Endo/Heme/Allergies: Negative.   Psychiatric/Behavioral: Negative.    All other systems reviewed and are negative.     All other systems are reviewed and negative.    PHYSICAL EXAM: VS:  BP (!) 100/54   Pulse 81   Ht 5' 1 (1.549 m)   Wt 136 lb 9.6 oz (62 kg)   SpO2 96%   BMI 25.81 kg/m  , BMI Body mass index is 25.81 kg/m. Last weight:  Wt Readings from  Last 3 Encounters:  06/02/24 136 lb 9.6 oz (62 kg)  05/13/24 141 lb 12.8 oz (64.3 kg)  04/07/24 145 lb (65.8 kg)     Physical Exam Constitutional:      Appearance: Normal appearance.  Cardiovascular:     Rate and Rhythm: Normal rate and regular rhythm.     Heart sounds: Normal heart sounds.  Pulmonary:     Effort: Pulmonary effort is normal.     Breath sounds: Normal breath sounds.  Musculoskeletal:     Right lower leg: No edema.     Left lower leg: No edema.  Neurological:     Mental Status: She is alert.       EKG:   Recent Labs: 08/31/2023: ALT 10; BUN 20; Creatinine, Ser 0.77; Hemoglobin 10.4; Platelets 237; Potassium 3.8; Sodium 136    Lipid Panel    Component Value Date/Time   CHOL 265 (H) 03/22/2023 1115   CHOL 285 (H) 04/23/2015 1144   TRIG 105 03/22/2023 1115   TRIG 140 04/23/2015 1144   HDL 54 03/22/2023 1115   HDL 50 04/23/2015 1144   CHOLHDL 4.9 (H) 03/22/2023 1115   CHOLHDL 2.3 06/17/2020 0450   VLDL 10 06/17/2020 0450   LDLCALC 193 (H) 03/22/2023 1115   LDLCALC 207 (H) 04/23/2015 1144      Other studies Reviewed: Additional studies/ records that were reviewed today include:  Review of the above records demonstrates:      11/23/2016    2:49 PM  PAD Screen  Previous PAD dx? No  Previous surgical procedure? No  Pain with walking? Yes  Subsides with rest? Yes  Feet/toe relief with dangling? Yes  Painful, non-healing ulcers? No  Extremities discolored? No      ASSESSMENT AND PLAN:    ICD-10-CM   1. Pericardial effusion  I31.39 diltiazem  (CARDIZEM  CD) 180 MG 24 hr capsule   Mild pericardial effusion, disatolic dysfunction, normal LVEF    2. Hypotension due to drugs  I95.2 diltiazem  (CARDIZEM  CD) 180 MG 24 hr capsule   diltaiazam 180 from 240.  3. Acute diastolic CHF (congestive heart failure) (HCC)  I50.31 diltiazem  (CARDIZEM  CD) 180 MG 24 hr capsule    4. Coronary artery disease involving native coronary artery of native heart  without angina pectoris  I25.10 diltiazem  (CARDIZEM  CD) 180 MG 24 hr capsule    5. SOB (shortness of breath)  R06.02 diltiazem  (CARDIZEM  CD) 180 MG 24 hr capsule    6. Mixed hyperlipidemia  E78.2 diltiazem  (CARDIZEM  CD) 180 MG 24 hr capsule    7. Sleep apnea, unspecified type  G47.30 diltiazem  (CARDIZEM  CD) 180 MG 24 hr capsule    8. Atrial fibrillation with RVR (HCC)  I48.91 diltiazem  (CARDIZEM  CD) 180 MG 24 hr capsule    9. Other chest pain  R07.89 diltiazem  (CARDIZEM  CD) 180 MG 24 hr capsule   Had chest pain yesterday, but stress test normal and had 45% mid LAD on cath 2024       Problem List Items Addressed This Visit       Cardiovascular and Mediastinum   Atrial fibrillation with RVR (HCC)   Relevant Medications   diltiazem  (CARDIZEM  CD) 180 MG 24 hr capsule   Acute diastolic CHF (congestive heart failure) (HCC)   Relevant Medications   diltiazem  (CARDIZEM  CD) 180 MG 24 hr capsule     Respiratory   Sleep apnea   Relevant Medications   diltiazem  (CARDIZEM  CD) 180 MG 24 hr capsule     Other   Hyperlipidemia   Relevant Medications   diltiazem  (CARDIZEM  CD) 180 MG 24 hr capsule   SOB (shortness of breath)   Relevant Medications   diltiazem  (CARDIZEM  CD) 180 MG 24 hr capsule   Other chest pain   Relevant Medications   diltiazem  (CARDIZEM  CD) 180 MG 24 hr capsule   Other Visit Diagnoses       Pericardial effusion    -  Primary   Mild pericardial effusion, disatolic dysfunction, normal LVEF   Relevant Medications   diltiazem  (CARDIZEM  CD) 180 MG 24 hr capsule     Hypotension due to drugs       diltaiazam 180 from 240.   Relevant Medications   diltiazem  (CARDIZEM  CD) 180 MG 24 hr capsule     Coronary artery disease involving native coronary artery of native heart without angina pectoris       Relevant Medications   diltiazem  (CARDIZEM  CD) 180 MG 24 hr capsule          Disposition:   Return in about 3 weeks (around 06/23/2024).    Total time spent: 35  minutes  Signed,  Denyse Bathe, MD  06/02/2024 2:37 PM    [facility]

## 2024-06-17 DIAGNOSIS — N181 Chronic kidney disease, stage 1: Secondary | ICD-10-CM | POA: Diagnosis not present

## 2024-06-23 ENCOUNTER — Ambulatory Visit (INDEPENDENT_AMBULATORY_CARE_PROVIDER_SITE_OTHER): Admitting: Cardiovascular Disease

## 2024-06-23 ENCOUNTER — Encounter: Payer: Self-pay | Admitting: Cardiovascular Disease

## 2024-06-23 VITALS — BP 96/50 | HR 58 | Ht 61.0 in | Wt 131.0 lb

## 2024-06-23 DIAGNOSIS — E782 Mixed hyperlipidemia: Secondary | ICD-10-CM | POA: Diagnosis not present

## 2024-06-23 DIAGNOSIS — I3139 Other pericardial effusion (noninflammatory): Secondary | ICD-10-CM | POA: Diagnosis not present

## 2024-06-23 DIAGNOSIS — I4891 Unspecified atrial fibrillation: Secondary | ICD-10-CM

## 2024-06-23 DIAGNOSIS — I10 Essential (primary) hypertension: Secondary | ICD-10-CM

## 2024-06-23 DIAGNOSIS — I2510 Atherosclerotic heart disease of native coronary artery without angina pectoris: Secondary | ICD-10-CM

## 2024-06-23 DIAGNOSIS — R0602 Shortness of breath: Secondary | ICD-10-CM

## 2024-06-23 MED ORDER — METFORMIN HCL 500 MG PO TABS: 500.0000 mg | ORAL_TABLET | Freq: Two times a day (BID) | ORAL | 2 refills | Status: AC

## 2024-06-23 NOTE — Progress Notes (Signed)
 Cardiology Office Note   Date:  06/23/2024   ID:  Tammy [name], Tammy [name], MRN [id number]  PCP:  Tammy Elsie [name], MD  Cardiologist:  Denyse Bathe, MD      History of Present Illness: Tammy [name] is a 73 y.o. female who presents for  Chief Complaint  Patient presents with   Follow-up    3 weeks follow up    Been feeling SOB.      Past Medical History:  Diagnosis Date   Aortic atherosclerosis    Atrial fibrillation (HCC)    a.) CHA2DS2VASc = 5 (age, CHF, HTN, vascular disease history, T2DM);  b.) rate/rhythm maintained on oral diltiazem  + sotolol; chronically anticoagulated with apixaban    CAD (coronary artery disease)    a.) cCTA 08/21/2014: Ca2+ = 873 (99th %ile); b.) cCTA 04/10/2023: Ca2+ = 733.7 (93rd %ile; 50-60% mRCA, 50% pLAD, 60% mLCx); c.) LHC 04/24/2023: 45% p-mLAD - med mgmt   CHF (congestive heart failure) (HCC)    a.) TTE 06/17/2020: EF 50-55%, LV dil, mild LVH, mild RVE, mod BAE, G1DD; b.) TTE 12/19/2022: EF >55%, mod LAE, triv TR/PR, G1DD   Cholelithiasis    Chronic calcific pancreatitis (HCC)    Complication of anesthesia    COPD (chronic obstructive pulmonary disease) (HCC)    DDD (degenerative disc disease), lumbar    a.) s/p L3-L5 fusion   DJD (degenerative joint disease)    Encephalitis    Fibromyalgia    Hepatic steatosis    Hiatal hernia    History of kidney stones    HTN (hypertension)    Hyperlipidemia    Lung nodule 10/26/2021   a.) LDCT 10/26/21: 10.5 mm macrolob/slightly spiculated LLL; b.) PET CT 11/09/21: not FCG avid (? 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; f.) CT chest 07/05/23: 1.4x3.0 LLL nod with 1.7cm solid comp   OAB (overactive bladder)    a.) on vibegron + fesoterodine    On apixaban  therapy    OSA on  CPAP    PONV (postoperative nausea and vomiting)    T2DM (type 2 diabetes mellitus) (HCC)      Past Surgical History:  Procedure Laterality Date   APPENDECTOMY     BACK SURGERY  2022   BREAST CYST EXCISION     BREAST EXCISIONAL BIOPSY Left 2004   COLONOSCOPY WITH PROPOFOL  N/A 11/26/2020   Procedure: COLONOSCOPY WITH PROPOFOL ;  Surgeon: Rollin Dover, MD;  Location: [facility];  Service: Endoscopy;  Laterality: N/A;   COLONOSCOPY WITH PROPOFOL  N/A 02/16/2023   Procedure: COLONOSCOPY WITH PROPOFOL ;  Surgeon: Rollin Dover, MD;  Location: [facility];  Service: Gastroenterology;  Laterality: N/A;   DIAGNOSTIC LAPAROSCOPY     HEMOSTASIS CLIP PLACEMENT  11/26/2020   Procedure: HEMOSTASIS CLIP PLACEMENT;  Surgeon: Rollin Dover, MD;  Location: [facility];  Service: Endoscopy;;   INTERCOSTAL NERVE BLOCK Left 08/29/2023   Procedure: INTERCOSTAL NERVE BLOCK;  Surgeon: Kerrin Elspeth BROCKS, MD;  Location: [facility] OR;  Service: Thoracic;  Laterality: Left;   KNEE ARTHROSCOPY     LEFT HEART CATH AND CORONARY ANGIOGRAPHY N/A 04/24/2023   Procedure: LEFT HEART CATH AND CORONARY ANGIOGRAPHY;  Surgeon: Bathe Denyse LABOR, MD;  Location: [facility];  Service: Cardiovascular;  Laterality: N/A;   LYMPH NODE DISSECTION Left 08/29/2023  Procedure: LYMPH NODE DISSECTION;  Surgeon: Kerrin Elspeth BROCKS, MD;  Location: [facility] OR;  Service: Thoracic;  Laterality: Left;   POLYPECTOMY  11/26/2020   Procedure: POLYPECTOMY;  Surgeon: Rollin Dover, MD;  Location: [facility];  Service: Endoscopy;;   POLYPECTOMY  02/16/2023   Procedure: POLYPECTOMY;  Surgeon: Rollin Dover, MD;  Location: [facility];  Service: Gastroenterology;;   SUBMUCOSAL TATTOO INJECTION  11/26/2020   Procedure: SUBMUCOSAL TATTOO INJECTION;  Surgeon: Rollin Dover, MD;  Location: [facility];  Service: Endoscopy;;   TEE WITHOUT CARDIOVERSION N/A 06/29/2020   Procedure: TRANSESOPHAGEAL ECHOCARDIOGRAM (TEE) with DCCV;  Surgeon: Fernand Denyse LABOR, MD;  Location: [facility];  Service: Cardiovascular;  Laterality: N/A;   TOTAL KNEE ARTHROPLASTY Left 05/18/2023   Procedure: LEFT TOTAL KNEE ARTHROPLASTY;  Surgeon: Vernetta Lonni GRADE, MD;  Location: [facility];  Service: Orthopedics;  Laterality: Left;     Current Outpatient Medications  Medication Sig Dispense Refill   acetaminophen  (TYLENOL ) 500 MG tablet Take 1-2 tablets (500-1,000 mg total) by mouth every 6 (six) hours as needed.     albuterol  (VENTOLIN  HFA) 108 (90 Base) MCG/ACT inhaler Inhale 2 puffs into the lungs every 6 (six) hours as needed. 8 g 2   apixaban  (ELIQUIS ) 5 MG TABS tablet Take 5 mg by mouth 2 (two) times daily.     dapagliflozin  propanediol (FARXIGA ) 10 MG TABS tablet Take 1 tablet (10 mg total) by mouth daily before breakfast. 30 tablet 3   diltiazem  (CARDIZEM  CD) 180 MG 24 hr capsule Take 1 capsule (180 mg total) by mouth daily. 30 capsule 11   DULoxetine  (CYMBALTA ) 60 MG capsule Take 60 mg by mouth daily.     EPINEPHrine  0.3 mg/0.3 mL IJ SOAJ injection Inject 0.3 mg into the muscle as needed for anaphylaxis.     Evolocumab (REPATHA SURECLICK) 140 MG/ML SOAJ Inject 140 mg into the skin every 14 (fourteen) days.     fesoterodine  (TOVIAZ ) 4 MG TB24 tablet Take 4 mg by mouth daily.     Fluticasone -Umeclidin-Vilant (TRELEGY ELLIPTA ) 100-62.5-25 MCG/ACT AEPB Inhale 1 Dose into the lungs daily. 180 each 3   furosemide  (LASIX ) 20 MG tablet Take 1 tablet (20 mg total) by mouth 2 (two) times daily. 30 tablet 11   isosorbide  mononitrate (IMDUR ) 30 MG 24 hr tablet Take 1 tablet (30 mg total) by mouth daily. 30 tablet 1   metFORMIN  (GLUCOPHAGE ) 500 MG tablet Take 1 tablet (500 mg total) by mouth 2 (two) times daily. 60 tablet 2   potassium chloride  (KLOR-CON ) 10 MEQ tablet Take 10 mEq by mouth daily.     sotalol  (BETAPACE ) 80 MG tablet Take 1 tablet (80 mg total) by mouth every 12 (twelve) hours. 60 tablet 0   tirzepatide (MOUNJARO) 12.5 MG/0.5ML Pen Inject 12.5 mg into  the skin every Tuesday.     valACYclovir  (VALTREX ) 1000 MG tablet Take 1,000 mg by mouth daily.     valsartan  (DIOVAN ) 40 MG tablet Take 1 tablet (40 mg total) by mouth daily. 30 tablet 11   Vibegron (GEMTESA) 75 MG TABS Take 1 tablet by mouth daily.     No current facility-administered medications for this visit.   Facility-Administered Medications Ordered in Other Visits  Medication Dose Route Frequency Provider Last Rate Last Admin   sodium chloride  flush (NS) 0.9 % injection 3 mL  3 mL Intravenous Q12H Fernand Denyse A, MD        Allergies:   Betadine [povidone iodine], Contrast media [iodinated contrast media], Iodine,  Metrizamide, Povidone-iodine, and Shellfish allergy    Social History:   reports that she quit smoking about 15 years ago. Her smoking use included cigarettes. She started smoking about 45 years ago. She has a 30 pack-year smoking history. She has never used smokeless tobacco. She reports that she does not drink alcohol  and does not use drugs.   Family History:  family history includes Breast cancer in her cousin; CAD (age of onset: 85) in her mother; CAD (age of onset: 41) in her brother; Diabetes in her father; Heart disease in her father; Stomach cancer in her maternal grandfather; Throat cancer in her paternal uncle.    ROS:     Review of Systems  Constitutional: Negative.   HENT: Negative.    Eyes: Negative.   Respiratory: Negative.    Gastrointestinal: Negative.   Genitourinary: Negative.   Musculoskeletal: Negative.   Skin: Negative.   Neurological: Negative.   Endo/Heme/Allergies: Negative.   Psychiatric/Behavioral: Negative.    All other systems reviewed and are negative.     All other systems are reviewed and negative.    PHYSICAL EXAM: VS:  BP (!) 96/50   Pulse (!) 58   Ht 5' 1 (1.549 m)   Wt 131 lb (59.4 kg)   SpO2 98%   BMI 24.75 kg/m  , BMI Body mass index is 24.75 kg/m. Last weight:  Wt Readings from Last 3 Encounters:  06/23/24  131 lb (59.4 kg)  06/02/24 136 lb 9.6 oz (62 kg)  05/13/24 141 lb 12.8 oz (64.3 kg)     Physical Exam Constitutional:      Appearance: Normal appearance.  Cardiovascular:     Rate and Rhythm: Normal rate and regular rhythm.     Heart sounds: Normal heart sounds.  Pulmonary:     Effort: Pulmonary effort is normal.     Breath sounds: Normal breath sounds.  Musculoskeletal:     Right lower leg: No edema.     Left lower leg: No edema.  Neurological:     Mental Status: She is alert.       EKG: NSR 70/min no acute changes  Recent Labs: 08/31/2023: ALT 10; BUN 20; Creatinine, Ser 0.77; Hemoglobin 10.4; Platelets 237; Potassium 3.8; Sodium 136    Lipid Panel    Component Value Date/Time   CHOL 265 (H) 03/22/2023 1115   CHOL 285 (H) 04/23/2015 1144   TRIG 105 03/22/2023 1115   TRIG 140 04/23/2015 1144   HDL 54 03/22/2023 1115   HDL 50 04/23/2015 1144   CHOLHDL 4.9 (H) 03/22/2023 1115   CHOLHDL 2.3 06/17/2020 0450   VLDL 10 06/17/2020 0450   LDLCALC 193 (H) 03/22/2023 1115   LDLCALC 207 (H) 04/23/2015 1144      Other studies Reviewed: Additional studies/ records that were reviewed today include:  Review of the above records demonstrates:      11/23/2016    2:49 PM  PAD Screen  Previous PAD dx? No  Previous surgical procedure? No  Pain with walking? Yes  Subsides with rest? Yes  Feet/toe relief with dangling? Yes  Painful, non-healing ulcers? No  Extremities discolored? No      ASSESSMENT AND PLAN:    ICD-10-CM   1. Primary hypertension  I10     2. Atrial fibrillation with rapid ventricular response (HCC)  I48.91     3. Pericardial effusion  I31.39     4. Atrial fibrillation with RVR (HCC)  I48.91     5. Mixed hyperlipidemia  E78.2     6. SOB (shortness of breath)  R06.02    Has SOB. Taking farxiga .    7. Coronary artery disease involving native coronary artery of native heart without angina pectoris  I25.10        Problem List Items Addressed  This Visit       Cardiovascular and Mediastinum   Atrial fibrillation with RVR (HCC)   HTN (hypertension) - Primary   Atrial fibrillation with rapid ventricular response (HCC)     Other   Hyperlipidemia   SOB (shortness of breath)   Other Visit Diagnoses       Pericardial effusion         Coronary artery disease involving native coronary artery of native heart without angina pectoris              Disposition:   Return in about 3 months (around 09/21/2024).    Total time spent: 35 minutes  Signed,  Denyse Bathe, MD  06/23/2024 10:08 AM    [facility]

## 2024-07-14 ENCOUNTER — Encounter: Payer: Self-pay | Admitting: Pulmonary Disease

## 2024-07-14 ENCOUNTER — Ambulatory Visit: Admitting: Pulmonary Disease

## 2024-07-14 VITALS — BP 102/60 | HR 85 | Temp 98.1°F | Ht 61.0 in | Wt 131.2 lb

## 2024-07-14 DIAGNOSIS — Z902 Acquired absence of lung [part of]: Secondary | ICD-10-CM | POA: Diagnosis not present

## 2024-07-14 DIAGNOSIS — J449 Chronic obstructive pulmonary disease, unspecified: Secondary | ICD-10-CM

## 2024-07-14 DIAGNOSIS — C3490 Malignant neoplasm of unspecified part of unspecified bronchus or lung: Secondary | ICD-10-CM | POA: Diagnosis not present

## 2024-07-14 DIAGNOSIS — Z87891 Personal history of nicotine dependence: Secondary | ICD-10-CM | POA: Diagnosis not present

## 2024-07-14 DIAGNOSIS — G4733 Obstructive sleep apnea (adult) (pediatric): Secondary | ICD-10-CM | POA: Diagnosis not present

## 2024-07-14 NOTE — Progress Notes (Signed)
 "  Subjective:    Patient ID: Tammy [name], female    DOB: Oct 17, 1950, 73 y.o.   MRN: [id number]  Patient Care Team: Loreli Elsie [name], MD as PCP - General (Internal Medicine) Lavona Agent, MD as PCP - Cardiology (Cardiology) Verdene Gills, RN as Oncology Nurse Navigator Tamea Dedra CROME, MD as Consulting Physician (Pulmonary Disease) Rennie Cindy SAUNDERS, MD as Consulting Physician (Oncology)  Chief Complaint  Patient presents with   COPD    No breathing problems. Using Trelegy daily.     BACKGROUND/INTERVAL:Tammy [name] is a 73 year old former smoker (quit 2010, 30 PY) who presents for follow-up on the issue of a left lower lobe nodule noted on LDCT previously.  She was initially evaluated on 20 December 2021. 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  She underwent robotic lobectomy on 29 August 2023.  She has done very well post lobectomy. She was last seen on 07 April 2024.    HPI Discussed the use of AI scribe software for clinical note transcription with the patient, who gave verbal consent to proceed.  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  The patient reports that her breathing has been good and she has good energy levels. She continues to use her CPAP machine for obstructive sleep apnea, which is managed by Dr. Maree. The patient does not report any new respiratory symptoms or changes in her condition.  She is currently on Mounjaro and Repatha as part of her medication regimen. She had a CT chest scan in October 2025.  This showed no recurrence of her cancer.     DATA 10/26/2021 chest LDCT: Left lower lobe pulmonary nodule main diameter of 1.5 cm, poorly defined. 11/09/2021 PET/CT: No signs of hypermetabolic activity on the nodule in question cannot exclude  indolent bronchogenic neoplasm, 18-month follow-up chest CT. 03/06/2022 chest CT: Persistent vague area of nodularity unchanged from prior, recommend follow-up CT 6 months. 04/20/2022 PFTs: FEV1 1.48 L or 77% predicted, FVC 2.32 L or 91% predicted, FEV1/FVC 64%, lung volumes normal with mild hyperinflation noted.  Bronchodilator response.  Diffusion capacity normal.  Consistent with moderate obstruction. 11/13/2022 chest CT: Persistent subsolid nodule 2.2 x 0.7 cm with 1.5 cm solid component.  Unchanged overall size, possible increased density.  Coronary calcifications noted 12/29/2022 robotic assisted navigational bronchoscopy: Atypical cells noted on biopsy specimen, inconclusive. 05/01/2023 PFTs: FEV1 1.34 L or 71% predicted, FVC 2.75 L or 110% predicted, FEV1/FVC 49%, there is mild hyperinflation and air trapping.  Diffusion capacity normal.  Consistent with moderate to severe obstructive lung disease. 07/05/2023 CT chest without contrast: Formal radiology interpretation not available at the time of visit with the patient.  Subsolid left lower lobe nodule has enlarged compared to prior.  Emphysematous changes. 08/01/2023 biopsies left lower lobe: Positive for mucinous adenocarcinoma with lepidic pattern. 08/24/2023 PET/CT: Part solid left lower lobe pulmonary nodule demonstrates no hypermetabolic activity similar to prior PET/CT.  No hypermetabolic pulmonary nodules demonstrated.  No evidence of metastatic disease.  Cholelithiasis.  Aortic atherosclerosis. 08/27/2023 chest x-ray PA and lateral: No evidence of acute cardiopulmonary process.  Will left lower lobe fiducial marker with vague adjacent groundglass density. 08/29/2023 robotic left lobectomy: Invasive well-differentiated adenocarcinoma mucinous type, tumor size 1.5 cm in greatest dimension.  Margins free.  Lymph nodes examined 15, lymph nodes involved 0.  Stage pT1b, pN0. 04/26/2023 CT chest without contrast: Status post left lower  lobectomy, mild centrilobular emphysema, diffuse bilateral bronchial wall thickening, no pleural effusion or pneumothorax.  No evidence of recurrent or metastatic disease in the chest.  Small pericardial effusion noted.  Review of Systems A 10 point review of systems was performed and it is as noted above otherwise negative.   Patient Active Problem List   Diagnosis Date Noted   History of adenocarcinoma of lung 02/04/2024   Leukocytosis 02/04/2024   Cancer of lower lobe of left lung (HCC) 09/28/2023   S/P Robotic Assisted Video Thoracoscopy with Left Lower Lobectomy of Lung 08/29/2023   Mediastinal adenopathy 08/01/2023   Status post total left knee replacement 05/18/2023   Unstable angina (HCC) 04/20/2023   Other chest pain 12/08/2022   COPD suggested by initial evaluation 04/20/2022   Nodule of lower lobe of left lung 11/11/2021   Fatigue 12/30/2020   Elevated coronary artery calcium score 12/30/2020   Unilateral primary osteoarthritis, left knee 10/06/2020   Respiratory failure, acute (HCC) 06/28/2020   Acute diastolic CHF (congestive heart failure) (HCC) 06/28/2020   Atrial fibrillation with rapid ventricular response (HCC) 06/28/2020   Acquired thrombophilia    Depression    Atrial fibrillation with RVR (HCC) 06/16/2020   Diabetes mellitus (HCC)    HTN (hypertension)    Sleep apnea    Obesity, Class III, BMI 40-49.9 (morbid obesity) (HCC)    Chronic venous insufficiency 12/30/2018   Varicose veins of both lower extremities with inflammation 12/30/2018   DJD (degenerative joint disease) 12/30/2018   Snoring 11/27/2018   Daytime sleepiness 11/27/2018   Educated about COVID-19 virus infection 11/27/2018   SOB (shortness of breath) 11/27/2018   Hyperlipidemia 05/20/2015   Knee pain 06/06/2012    Social History   Tobacco Use   Smoking status: Former    Current packs/day: 0.00    Average packs/day: 1 pack/day for 30.0 years (30.0 ttl pk-yrs)    Types: Cigarettes     Start date: 11/02/1978    Quit date: 11/01/2008    Years since quitting: 15.7   Smokeless tobacco: Never  Substance Use Topics   Alcohol  use: No    Alcohol /week: 0.0 standard drinks of alcohol     Allergies[1]  Active Medications[2]  Immunization History  Administered Date(s) Administered   Fluzone Influenza virus vaccine,trivalent (IIV3), split virus 06/04/2013, 08/05/2014   Influenza-Unspecified 03/21/2021   PFIZER Comirnaty(Gray Top)Covid-19 Tri-Sucrose Vaccine 10/03/2019, 10/31/2019   PFIZER(Purple Top)SARS-COV-2 Vaccination 05/23/2021   Pneumococcal Conjugate-13 09/27/2016, 09/10/2017   Pneumococcal Polysaccharide-23 09/12/2018   Td (Adult),5 Lf Tetanus Toxid, Preservative Free 12/15/2009   Tdap 06/14/2022   Zoster, Live 06/04/2013, 10/10/2016, 02/04/2017        Objective:     Vitals:   07/14/24 1001  BP: 102/60  Pulse: 85  Temp: 98.1 F (36.7 C)  Height: 5' 1 (1.549 m)  Weight: 131 lb 3.2 oz (59.5 kg)  SpO2: 97%  TempSrc: Temporal  BMI (Calculated): 24.8     GENERAL: Morbidly obese woman, no acute distress, fully ambulatory.  No conversational dyspnea. HEAD: Normocephalic, atraumatic.  EYES: Pupils equal, round, reactive to light.  No scleral icterus.  MOUTH: No prosthesis, few chipped teeth.  Oral mucosa moist.  No thrush. NECK: Supple. No thyromegaly. Trachea midline. No JVD.  No adenopathy. PULMONARY: Good air entry bilaterally.  No adventitious sounds. CARDIOVASCULAR: S1 and S2. Regular rate and rhythm.  Grade 2/6 systolic ejection murmur left sternal border. ABDOMEN: Benign. MUSCULOSKELETAL: No  joint deformity, no clubbing, trace lower extremity edema.  NEUROLOGIC: Grossly nonfocal, gait slow.  Speech is fluent. SKIN: Intact,warm,dry.  Multiple varicosities lower extremities, mild stasis changes. PSYCH: Mood and behavior normal.           Assessment & Plan:     ICD-10-CM   1. Stage 2 moderate COPD by GOLD classification (HCC)  J44.9     2.  Mucinous adenocarcinoma of lung (HCC)  C34.90 CT CHEST WO CONTRAST    3. OSA on CPAP  G47.33     4. History of lobectomy of lung  Z90.2 CT CHEST WO CONTRAST      Orders Placed This Encounter  Procedures   CT CHEST WO CONTRAST    Standing Status:   Future    Expected Date:   10/27/2024    Expiration Date:   07/14/2025    Preferred imaging location?:   [facility]   Discussion:    Stage 2 moderate chronic obstructive pulmonary disease (COPD) Lung sounds are clear on examination. - Continue current management and follow-up in six months unless problems arise.  Mucinous adenocarcinoma of the lung, status post lobectomy Last CT scan was in October 2025, next due in April 2026. - Ordered CT scan for April 2026. - Scheduled follow-up in six months unless problems arise.    Advised if symptoms do not improve or worsen, to please contact office for sooner follow up or seek emergency care.    I spent 30 minutes of dedicated to the care of this patient on the date of this encounter to include pre-visit review of records, face-to-face time with the patient discussing conditions above, post visit ordering of testing, clinical documentation with the electronic health record, making appropriate referrals as documented, and communicating necessary findings to members of the patients care team.     C. Leita Sanders, MD Advanced [facility]ch[facility]    *This note was generated using voice recognition software/Dragon and/or AI transcription program.  Despite best efforts to proofread, errors can occur which can change the meaning. Any transcriptional errors that result from this process are unintentional and may not be fully corrected at the time of dictation.     [1]  Allergies Allergen Reactions   Betadine [Povidone Iodine] Anaphylaxis   Contrast Media [Iodinated Contrast Media] Anaphylaxis   Iodine Anaphylaxis   Metrizamide Anaphylaxis    Povidone-Iodine Anaphylaxis   Shellfish Allergy Anaphylaxis  [2]  Current Meds  Medication Sig   acetaminophen  (TYLENOL ) 500 MG tablet Take 1-2 tablets (500-1,000 mg total) by mouth every 6 (six) hours as needed.   albuterol  (VENTOLIN  HFA) 108 (90 Base) MCG/ACT inhaler Inhale 2 puffs into the lungs every 6 (six) hours as needed.   apixaban  (ELIQUIS ) 5 MG TABS tablet Take 5 mg by mouth 2 (two) times daily.   dapagliflozin  propanediol (FARXIGA ) 10 MG TABS tablet Take 1 tablet (10 mg total) by mouth daily before breakfast.   diltiazem  (CARDIZEM  CD) 180 MG 24 hr capsule Take 1 capsule (180 mg total) by mouth daily.   DULoxetine  (CYMBALTA ) 60 MG capsule Take 60 mg by mouth daily.   EPINEPHrine  0.3 mg/0.3 mL IJ SOAJ injection Inject 0.3 mg into the muscle as needed for anaphylaxis.   Evolocumab (REPATHA SURECLICK) 140 MG/ML SOAJ Inject 140 mg into the skin every 14 (fourteen) days.   fesoterodine  (TOVIAZ ) 4 MG TB24 tablet Take 4 mg by mouth daily.   Fluticasone -Umeclidin-Vilant (TRELEGY ELLIPTA ) 100-62.5-25 MCG/ACT AEPB Inhale 1 Dose into the  lungs daily.   furosemide  (LASIX ) 20 MG tablet Take 1 tablet (20 mg total) by mouth 2 (two) times daily.   isosorbide  mononitrate (IMDUR ) 30 MG 24 hr tablet Take 1 tablet (30 mg total) by mouth daily.   metFORMIN  (GLUCOPHAGE ) 500 MG tablet Take 1 tablet (500 mg total) by mouth 2 (two) times daily.   potassium chloride  (KLOR-CON ) 10 MEQ tablet Take 10 mEq by mouth daily. (Patient taking differently: Take 10 mEq by mouth once a week.)   sotalol  (BETAPACE ) 80 MG tablet Take 1 tablet (80 mg total) by mouth every 12 (twelve) hours.   tirzepatide (MOUNJARO) 12.5 MG/0.5ML Pen Inject 12.5 mg into the skin every Tuesday.   valACYclovir  (VALTREX ) 1000 MG tablet Take 1,000 mg by mouth daily.   valsartan  (DIOVAN ) 40 MG tablet Take 1 tablet (40 mg total) by mouth daily.   Vibegron (GEMTESA) 75 MG TABS Take 1 tablet by mouth daily.   "

## 2024-07-14 NOTE — Patient Instructions (Signed)
 VISIT SUMMARY:  Today, you came in for a follow-up visit to check on your chronic obstructive pulmonary disease (COPD) and lung cancer status post lobectomy. You reported that your breathing has been good and you have good energy levels. You are continuing to use your CPAP machine for obstructive sleep apnea, and you did not report any new respiratory symptoms or changes in your condition.  YOUR PLAN:  -STAGE 2 MODERATE CHRONIC OBSTRUCTIVE PULMONARY DISEASE (COPD): COPD is a chronic lung condition that makes it hard to breathe. Your lung sounds are clear, and you should continue with your current management plan. We will follow up in six months unless any problems arise.  -MUCINOUS ADENOCARCINOMA OF THE LUNG, STATUS POST LOBECTOMY: This is a type of lung cancer that you had surgery to remove. Your last neck scan was in October, and the next one is due in October. We have ordered a CT scan for 6 months from your October scan.  We will see you in follow-up in 6 months time call sooner should any new problems arise.  INSTRUCTIONS:  Please continue with your current management plan for COPD and use your CPAP machine as directed. Your next CT scan is scheduled for April 2026, and we will see you for a follow-up in six months unless you experience any problems.

## 2024-09-22 ENCOUNTER — Ambulatory Visit: Admitting: Cardiovascular Disease

## 2024-10-24 ENCOUNTER — Other Ambulatory Visit

## 2025-01-13 ENCOUNTER — Ambulatory Visit: Admitting: Pulmonary Disease
